# Patient Record
Sex: Female | Born: 1955
Health system: Southern US, Community
[De-identification: ages and names within clinical notes are randomized; demographics above are authoritative.]

## PROBLEM LIST (undated history)

## (undated) DIAGNOSIS — F32A Depression, unspecified: Secondary | ICD-10-CM

## (undated) DIAGNOSIS — Z800 Family history of malignant neoplasm of digestive organs: Secondary | ICD-10-CM

## (undated) DIAGNOSIS — C3490 Malignant neoplasm of unspecified part of unspecified bronchus or lung: Secondary | ICD-10-CM

## (undated) DIAGNOSIS — Z803 Family history of malignant neoplasm of breast: Secondary | ICD-10-CM

## (undated) DIAGNOSIS — Z8041 Family history of malignant neoplasm of ovary: Secondary | ICD-10-CM

## (undated) DIAGNOSIS — I10 Essential (primary) hypertension: Secondary | ICD-10-CM

## (undated) DIAGNOSIS — C801 Malignant (primary) neoplasm, unspecified: Secondary | ICD-10-CM

## (undated) DIAGNOSIS — F329 Major depressive disorder, single episode, unspecified: Secondary | ICD-10-CM

## (undated) DIAGNOSIS — E079 Disorder of thyroid, unspecified: Secondary | ICD-10-CM

## (undated) DIAGNOSIS — E785 Hyperlipidemia, unspecified: Secondary | ICD-10-CM

## (undated) HISTORY — PX: TUBAL LIGATION: SHX77

## (undated) HISTORY — DX: Disorder of thyroid, unspecified: E07.9

## (undated) HISTORY — DX: Hyperlipidemia, unspecified: E78.5

## (undated) HISTORY — PX: PARTIAL HYSTERECTOMY: SHX80

## (undated) HISTORY — DX: Family history of malignant neoplasm of breast: Z80.3

## (undated) HISTORY — DX: Family history of malignant neoplasm of digestive organs: Z80.0

## (undated) HISTORY — DX: Malignant neoplasm of unspecified part of unspecified bronchus or lung: C34.90

## (undated) HISTORY — PX: BILATERAL CARPAL TUNNEL RELEASE: SHX6508

## (undated) HISTORY — PX: LUNG BIOPSY: SHX232

## (undated) HISTORY — PX: BREAST CYST ASPIRATION: SHX578

## (undated) HISTORY — DX: Family history of malignant neoplasm of ovary: Z80.41

---

## 1987-04-22 DIAGNOSIS — C801 Malignant (primary) neoplasm, unspecified: Secondary | ICD-10-CM

## 1987-04-22 HISTORY — DX: Malignant (primary) neoplasm, unspecified: C80.1

## 2010-01-17 ENCOUNTER — Ambulatory Visit: Payer: Self-pay | Admitting: Family Medicine

## 2013-11-10 DIAGNOSIS — M79672 Pain in left foot: Secondary | ICD-10-CM | POA: Insufficient documentation

## 2013-11-10 DIAGNOSIS — F5104 Psychophysiologic insomnia: Secondary | ICD-10-CM | POA: Insufficient documentation

## 2013-11-10 DIAGNOSIS — E039 Hypothyroidism, unspecified: Secondary | ICD-10-CM | POA: Insufficient documentation

## 2013-11-10 DIAGNOSIS — E782 Mixed hyperlipidemia: Secondary | ICD-10-CM | POA: Insufficient documentation

## 2013-11-22 DIAGNOSIS — M79609 Pain in unspecified limb: Secondary | ICD-10-CM | POA: Insufficient documentation

## 2013-11-22 DIAGNOSIS — G479 Sleep disorder, unspecified: Secondary | ICD-10-CM | POA: Insufficient documentation

## 2013-11-22 DIAGNOSIS — R200 Anesthesia of skin: Secondary | ICD-10-CM | POA: Insufficient documentation

## 2014-04-25 DIAGNOSIS — E559 Vitamin D deficiency, unspecified: Secondary | ICD-10-CM | POA: Insufficient documentation

## 2014-05-15 DIAGNOSIS — N3941 Urge incontinence: Secondary | ICD-10-CM | POA: Insufficient documentation

## 2014-05-15 DIAGNOSIS — N952 Postmenopausal atrophic vaginitis: Secondary | ICD-10-CM | POA: Insufficient documentation

## 2014-10-30 DIAGNOSIS — G5603 Carpal tunnel syndrome, bilateral upper limbs: Secondary | ICD-10-CM | POA: Insufficient documentation

## 2014-12-19 ENCOUNTER — Other Ambulatory Visit: Payer: No Typology Code available for payment source

## 2014-12-19 ENCOUNTER — Telehealth: Payer: Self-pay

## 2014-12-19 DIAGNOSIS — R3129 Other microscopic hematuria: Secondary | ICD-10-CM

## 2014-12-19 LAB — URINALYSIS, COMPLETE
BILIRUBIN UA: NEGATIVE
Glucose, UA: NEGATIVE
LEUKOCYTES UA: NEGATIVE
Nitrite, UA: NEGATIVE
PH UA: 5.5 (ref 5.0–7.5)
PROTEIN UA: NEGATIVE
Specific Gravity, UA: >1.03 — ABNORMAL HIGH (ref 1.005–1.030)
Urobilinogen, Ur: 0.2 mg/dL (ref 0.2–1.0)

## 2014-12-19 LAB — MICROSCOPIC EXAMINATION

## 2014-12-19 NOTE — Telephone Encounter (Signed)
Pt called requesting another medication for OAB. Pt states insurance will not pay for myrbetriq. Pt also c/o increased itching in vaginal area. Pt states she was previously given a sample of estrace cream that helped tremendously and was wondering if she could have more. Pt was last seen 05/2014.

## 2014-12-19 NOTE — Telephone Encounter (Signed)
At the patient's last visit, she had microscopic hematuria and I asked her to RTC in 3 weeks for another urinalysis.  I don't see where that was done.  We can provide her with samples of the Myrbetriq if she wants to continue that medication.  If we have samples of the ESTRACE cream, she can have a sample of that as well.  She can leave a urine when she picks up the samples.

## 2014-12-19 NOTE — Telephone Encounter (Signed)
°  I called the pt and informed her that Larene Beach okay given her samples of Myrbetriq '25MG'$  & Estrace cream. I also informed her that we need a urine when she comes in.

## 2014-12-21 ENCOUNTER — Telehealth: Payer: Self-pay

## 2014-12-21 NOTE — Telephone Encounter (Signed)
LMOM in reference to no blood in urine.

## 2014-12-21 NOTE — Telephone Encounter (Signed)
-----   Message from Nori Riis, PA-C sent at 12/21/2014  8:32 AM EDT ----- Patient did not have blood in her urine.  She will need an appointment in February for her annual visit and we can check another UA at that visit.

## 2015-01-24 ENCOUNTER — Other Ambulatory Visit: Payer: Self-pay | Admitting: Internal Medicine

## 2015-01-24 DIAGNOSIS — Z1231 Encounter for screening mammogram for malignant neoplasm of breast: Secondary | ICD-10-CM

## 2015-01-25 ENCOUNTER — Ambulatory Visit
Admission: RE | Admit: 2015-01-25 | Discharge: 2015-01-25 | Disposition: A | Discharge status: Home / Self Care | Payer: No Typology Code available for payment source | Source: Ambulatory Visit | Attending: Internal Medicine | Admitting: Internal Medicine

## 2015-01-25 DIAGNOSIS — Z1231 Encounter for screening mammogram for malignant neoplasm of breast: Secondary | ICD-10-CM | POA: Insufficient documentation

## 2015-01-25 HISTORY — DX: Malignant (primary) neoplasm, unspecified: C80.1

## 2015-04-05 ENCOUNTER — Telehealth: Payer: Self-pay | Admitting: Urology

## 2015-04-05 NOTE — Telephone Encounter (Signed)
Pt called and is out of samples of Myrbetriq and wants to know if she can get some more.  Please call pt.

## 2015-04-05 NOTE — Telephone Encounter (Signed)
Spoke to patient and let her know that Larene Beach will put some samples up front for her and she needs a follow up appointment. Patient states her insurance will change after the first of the year and she will have to pay out of pocket could she be seen before the end of the year and I let her know to ask when she picks up samples if she has any openings that will fit her schedule before the end of the year. Patient states ok.

## 2015-07-31 DIAGNOSIS — IMO0002 Reserved for concepts with insufficient information to code with codable children: Secondary | ICD-10-CM | POA: Insufficient documentation

## 2015-07-31 DIAGNOSIS — Z79899 Other long term (current) drug therapy: Secondary | ICD-10-CM | POA: Insufficient documentation

## 2016-02-08 ENCOUNTER — Ambulatory Visit (INDEPENDENT_AMBULATORY_CARE_PROVIDER_SITE_OTHER): Payer: BLUE CROSS/BLUE SHIELD

## 2016-02-08 ENCOUNTER — Encounter (INDEPENDENT_AMBULATORY_CARE_PROVIDER_SITE_OTHER): Payer: Self-pay | Admitting: Vascular Surgery

## 2016-02-08 ENCOUNTER — Ambulatory Visit (INDEPENDENT_AMBULATORY_CARE_PROVIDER_SITE_OTHER): Payer: BLUE CROSS/BLUE SHIELD | Admitting: Vascular Surgery

## 2016-02-08 ENCOUNTER — Other Ambulatory Visit (INDEPENDENT_AMBULATORY_CARE_PROVIDER_SITE_OTHER): Payer: Self-pay | Admitting: Vascular Surgery

## 2016-02-08 VITALS — BP 132/85 | HR 95 | Resp 17 | Ht 69.0 in | Wt 194.0 lb

## 2016-02-08 DIAGNOSIS — I739 Peripheral vascular disease, unspecified: Secondary | ICD-10-CM

## 2016-02-08 DIAGNOSIS — I10 Essential (primary) hypertension: Secondary | ICD-10-CM | POA: Insufficient documentation

## 2016-02-08 DIAGNOSIS — M79605 Pain in left leg: Secondary | ICD-10-CM

## 2016-02-08 DIAGNOSIS — M79604 Pain in right leg: Secondary | ICD-10-CM

## 2016-02-08 DIAGNOSIS — M79609 Pain in unspecified limb: Secondary | ICD-10-CM | POA: Diagnosis not present

## 2016-02-08 DIAGNOSIS — I75021 Atheroembolism of right lower extremity: Secondary | ICD-10-CM

## 2016-02-08 DIAGNOSIS — I70213 Atherosclerosis of native arteries of extremities with intermittent claudication, bilateral legs: Secondary | ICD-10-CM

## 2016-02-08 DIAGNOSIS — F17200 Nicotine dependence, unspecified, uncomplicated: Secondary | ICD-10-CM | POA: Diagnosis not present

## 2016-02-08 NOTE — Assessment & Plan Note (Signed)
ABIs today are 0.92 on the right and 0.98 on the left with good waveforms except at the digital location on the right. She clearly had some atheroembolization to her right toes (blue toe syndrome). This has significantly improved from her initial visit. She has done well with medical management and does not require intervention at this point. Continue Plavix and Lipitor. Smoking cessation strongly recommended. Recheck her with duplex and ABIs in 3-4 months.

## 2016-02-08 NOTE — Assessment & Plan Note (Signed)
blood pressure control important in reducing the progression of atherosclerotic disease. On appropriate oral medications.  

## 2016-02-08 NOTE — Assessment & Plan Note (Signed)
We discussed the absolute need for smoking cessation due to the deleterious nature of tobacco on the vascular system. We discussed the tobacco use would diminish patency of any intervention, and likely significantly worsen progressio of disease. We discussed multiple agents for quitting including replacement therapy or medications to reduce cravings such as Chantix. The patient voices their understanding of the importance of smoking cessation. She was prescribed Chantix last time but not taking it currently. She has cut back on tobacco and is trying to quit.

## 2016-02-08 NOTE — Progress Notes (Signed)
MRN : [id number]  Kristina Wright is a 60 y.o. (10-Sep-1955) female who presents with chief complaint of  Chief Complaint  Patient presents with  . Follow-up  .  History of Present Illness: Patient returns today in follow up of Her blue toe syndrome on the right leg. Her toes look much better and her foot feels much better since her initial visit. She was started on Plavix and Lipitor and that seems to help significantly. She has not smoking yet, but she has cut back significantly. ABIs today are 0.92 on the right and 0.98 on the left with good waveforms except at the digital location on the right.     Current Outpatient Prescriptions  Medication Sig Dispense Refill  . atorvastatin (LIPITOR) 10 MG tablet   0  . Cholecalciferol (VITAMIN D) 2000 units tablet Take by mouth.    . clopidogrel (PLAVIX) 75 MG tablet   0  . FLUoxetine (PROZAC) 40 MG capsule Take by mouth.    Marland Kitchen ibuprofen (ADVIL,MOTRIN) 200 MG tablet Take by mouth.    . levothyroxine (SYNTHROID, LEVOTHROID) 200 MCG tablet TAKE ONE TABLET BY MOUTH ONCE DAILY...TAKE ON AN EMPTY STOMACH WITH A GLASS OF WATER AT LEAST 30-60 MINUTES BEFORE BREAKFAST    . lisinopril-hydrochlorothiazide (PRINZIDE,ZESTORETIC) 10-12.5 MG tablet Take by mouth.    . Multiple Vitamin (MULTI-VITAMINS) TABS Take by mouth.     No current facility-administered medications for this visit.     Past Medical History:  Diagnosis Date  . Cancer [facility]) 1989   cervical    Past Surgical History:  Procedure Laterality Date  . BREAST CYST ASPIRATION Left     Social History Social History  Substance Use Topics  . Smoking status: Current Every Day Smoker    Types: Cigarettes  . Smokeless tobacco: Current User  . Alcohol use No     Family History Family History  Problem Relation Age of Onset  . Colon cancer Mother 3  . Colon cancer Maternal Grandmother 78     No Known Allergies   REVIEW OF SYSTEMS (Negative unless checked)  Constitutional:  '[]'$ Weight loss  '[]'$ Fever  '[]'$ Chills Cardiac: '[]'$ Chest pain   '[]'$ Chest pressure   '[]'$ Palpitations   '[]'$ Shortness of breath when laying flat   '[]'$ Shortness of breath at rest   '[]'$ Shortness of breath with exertion. Vascular:  '[]'$ Pain in legs with walking   '[]'$ Pain in legs at rest   '[]'$ Pain in legs when laying flat   '[]'$ Claudication   '[]'$ Pain in feet when walking  '[x]'$ Pain in feet at rest  '[]'$ Pain in feet when laying flat   '[]'$ History of DVT   '[]'$ Phlebitis   '[]'$ Swelling in legs   '[]'$ Varicose veins   '[]'$ Non-healing ulcers Pulmonary:   '[]'$ Uses home oxygen   '[]'$ Productive cough   '[]'$ Hemoptysis   '[]'$ Wheeze  '[]'$ COPD   '[]'$ Asthma Neurologic:  '[]'$ Dizziness  '[]'$ Blackouts   '[]'$ Seizures   '[]'$ History of stroke   '[]'$ History of TIA  '[]'$ Aphasia   '[]'$ Temporary blindness   '[]'$ Dysphagia   '[]'$ Weakness or numbness in arms   '[]'$ Weakness or numbness in legs Musculoskeletal:  '[]'$ Arthritis   '[]'$ Joint swelling   '[]'$ Joint pain   '[]'$ Low back pain Hematologic:  '[]'$ Easy bruising  '[]'$ Easy bleeding   '[]'$ Hypercoagulable state   '[]'$ Anemic   Gastrointestinal:  '[]'$ Blood in stool   '[]'$ Vomiting blood  '[]'$ Gastroesophageal reflux/heartburn   '[]'$ Abdominal pain Genitourinary:  '[]'$ Chronic kidney disease   '[]'$ Difficult urination  '[]'$ Frequent urination  '[]'$ Burning with urination   '[]'$ Hematuria Skin:  '[]'$ Rashes   '[]'$   Ulcers   '[]'$ Wounds Psychological:  '[]'$ History of anxiety   '[x]'$  History of major depression.  Physical Examination  BP 132/85   Pulse 95   Resp 17   Ht '5\' 9"'$  (1.753 m)   Wt 194 lb (88 kg)   BMI 28.65 kg/m  Gen:  WD/WN, NAD Head: [place]/AT, No temporalis wasting. Ear/Nose/Throat: Hearing grossly intact, nares w/o erythema or drainage, trachea midline Eyes: PERRLA, Sclera non-icteric Neck: Supple, no nuchal rigidity.  No JVD.  Pulmonary:  Good air movement, no use of accessory muscles.  Cardiac: RRR, normal S1, S2 Vascular: Mild cyanosis of the right third and fifth toes but much better than her initial visit. Vessel Right Left  Radial Palpable Palpable  Ulnar Palpable Palpable    Brachial Palpable Palpable  Carotid Palpable, without bruit Palpable, without bruit  Aorta Not palpable N/A  Femoral Palpable Palpable  Popliteal Palpable Palpable  PT 1+ Palpable 1+ Palpable  DP 1+ Palpable Palpable   Gastrointestinal: soft, non-tender/non-distended. No guarding/reflex.  Musculoskeletal: M/S 5/5 throughout.  No deformity or atrophy. No lower extremity edema. Neurologic: CN 2-12 intact. Pain and light touch intact in extremities.  Symmetrical.  Speech is fluent.  Psychiatric: Judgment intact, Mood & affect appropriate for pt's clinical situation. Dermatologic: No rashes or ulcers noted.  No cellulitis or open wounds. Lymph : No Cervical, Axillary, or Inguinal lymphadenopathy.      Labs No results found for this or any previous visit (from the past 2160 hour(s)).  Radiology No results found.   Assessment/Plan  Tobacco use disorder We discussed the absolute need for smoking cessation due to the deleterious nature of tobacco on the vascular system. We discussed the tobacco use would diminish patency of any intervention, and likely significantly worsen progressio of disease. We discussed multiple agents for quitting including replacement therapy or medications to reduce cravings such as Chantix. The patient voices their understanding of the importance of smoking cessation. She was prescribed Chantix last time but not taking it currently. She has cut back on tobacco and is trying to quit.   Essential hypertension blood pressure control important in reducing the progression of atherosclerotic disease. On appropriate oral medications.   PVD (peripheral vascular disease) (HCC) ABIs today are 0.92 on the right and 0.98 on the left with good waveforms except at the digital location on the right. She clearly had some atheroembolization to her right toes (blue toe syndrome). This has significantly improved from her initial visit. She has done well with medical management  and does not require intervention at this point. Continue Plavix and Lipitor. Smoking cessation strongly recommended. Recheck her with duplex and ABIs in 3-4 months.  Blue toe syndrome of right lower extremity (HCC) ABIs today are 0.92 on the right and 0.98 on the left with good waveforms except at the digital location on the right. She clearly had some atheroembolization to her right toes (blue toe syndrome). This has significantly improved from her initial visit. She has done well with medical management and does not require intervention at this point. Continue Plavix and Lipitor. Smoking cessation strongly recommended. Recheck her with duplex and ABIs in 3-4 months.    Leotis Pain, MD  02/08/2016 5:02 PM    This note was created with Dragon medical transcription system.  Any errors from dictation are purely unintentional

## 2016-02-15 ENCOUNTER — Other Ambulatory Visit: Payer: Self-pay | Admitting: Internal Medicine

## 2016-02-15 DIAGNOSIS — Z1231 Encounter for screening mammogram for malignant neoplasm of breast: Secondary | ICD-10-CM

## 2016-02-21 ENCOUNTER — Ambulatory Visit: Admission: RE | Admit: 2016-02-21 | Payer: No Typology Code available for payment source | Source: Ambulatory Visit

## 2016-06-03 ENCOUNTER — Ambulatory Visit (INDEPENDENT_AMBULATORY_CARE_PROVIDER_SITE_OTHER): Payer: BLUE CROSS/BLUE SHIELD | Admitting: Vascular Surgery

## 2016-06-03 ENCOUNTER — Encounter (INDEPENDENT_AMBULATORY_CARE_PROVIDER_SITE_OTHER): Payer: BLUE CROSS/BLUE SHIELD

## 2016-12-19 ENCOUNTER — Ambulatory Visit
Admission: EM | Admit: 2016-12-19 | Discharge: 2016-12-19 | Disposition: A | Discharge status: Home / Self Care | Payer: Self-pay | Attending: Family Medicine | Admitting: Family Medicine

## 2016-12-19 DIAGNOSIS — L02215 Cutaneous abscess of perineum: Secondary | ICD-10-CM

## 2016-12-19 DIAGNOSIS — I10 Essential (primary) hypertension: Secondary | ICD-10-CM

## 2016-12-19 HISTORY — DX: Essential (primary) hypertension: I10

## 2016-12-19 HISTORY — DX: Depression, unspecified: F32.A

## 2016-12-19 HISTORY — DX: Major depressive disorder, single episode, unspecified: F32.9

## 2016-12-19 MED ORDER — MUPIROCIN 2 % EX OINT: TOPICAL_OINTMENT | CUTANEOUS | 0 refills | Status: DC

## 2016-12-19 MED ORDER — OXYCODONE-ACETAMINOPHEN 5-325 MG PO TABS: 1.0000 | ORAL_TABLET | Freq: Three times a day (TID) | ORAL | 0 refills | Status: DC | PRN

## 2016-12-19 MED ORDER — SULFAMETHOXAZOLE-TRIMETHOPRIM 800-160 MG PO TABS: 1.0000 | ORAL_TABLET | Freq: Two times a day (BID) | ORAL | 0 refills | Status: AC

## 2016-12-19 NOTE — Discharge Instructions (Signed)
Take medication as prescribed. Rest. Drink plenty of fluids. Keep clean. Use warm compresses and sitz baths as discussed.   Monitor blood pressure closely as discussed and take medication as prescribed. Please follow up with your primary doctor regarding this.   Follow up with surgery, as discussed, see above to call, for continued complaints.   Return to Urgent care for new or worsening concerns.

## 2016-12-19 NOTE — ED Provider Notes (Addendum)
[facility] ____________________________________________  Time seen: Approximately 10:40 AM  I have reviewed the triage vital signs and the nursing notes.   HISTORY  Chief Complaint Abscess  HPI Kristina Wright is a 61 y.o. female  Presents for evaluation of right buttocks tender skin area. States she noticed the area about one month ago, the area much improved and then got worse again over the last week. States the area is tender. States pain is only with direct touching or when sitting on the area. Reports continues to urinate and move bowels normally. Denies any vaginal or rectal discomfort, rectal leakage, bloody stool, abdominal pain, back pain, fevers, nausea, vomiting, diarrhea, constipation, or other complaints. Denies any history of similar. States no pain at this time, but up to moderate pain when present. States no drainage or surrounding redness. States she has been applying topical boil ease without change. No other over-the-counter medications taken. Reports otherwise feels well. Denies other skin changes. Denies insect bite or tick attachment.   Denies chest pain, shortness of breath, abdominal pain, dysuria, dizziness, vision changes, headache, swelling. Denies recent sickness. Denies recent antibiotic use. Denies history of MRSA or previous skin infections. States tetanus immunization is up to date.   Patient reports that she is a history of hypertension and depression, but states she recently stopped all of her medications. Patient states currently no medications taken daily. Patient reports that she has also recently quit smoking.   Ezequiel Kayser, MD: PCP    Past Medical History:  Diagnosis Date  . Cancer ([place]) 1989   cervical  . Depression   . Hypertension     Patient Active Problem List   Diagnosis Date Noted  . Tobacco use disorder 02/08/2016  . Essential hypertension 02/08/2016  . PVD (peripheral vascular disease) ([place]) 02/08/2016  . Blue toe  syndrome of right lower extremity ([place]) 02/08/2016    Past Surgical History:  Procedure Laterality Date  . BREAST CYST ASPIRATION Left      No current facility-administered medications for this encounter.   Current Outpatient Prescriptions:  .  atorvastatin (LIPITOR) 10 MG tablet, , Disp: , Rfl: 0 .  Cholecalciferol (VITAMIN D) 2000 units tablet, Take by mouth., Disp: , Rfl:  .  clopidogrel (PLAVIX) 75 MG tablet, , Disp: , Rfl: 0 .  FLUoxetine (PROZAC) 40 MG capsule, Take by mouth., Disp: , Rfl:  .  ibuprofen (ADVIL,MOTRIN) 200 MG tablet, Take by mouth., Disp: , Rfl:  .  levothyroxine (SYNTHROID, LEVOTHROID) 200 MCG tablet, TAKE ONE TABLET BY MOUTH ONCE DAILY...TAKE ON AN EMPTY STOMACH WITH A GLASS OF WATER AT LEAST 30-60 MINUTES BEFORE BREAKFAST, Disp: , Rfl:  .  lisinopril-hydrochlorothiazide (PRINZIDE,ZESTORETIC) 10-12.5 MG tablet, Take by mouth., Disp: , Rfl:  .  Multiple Vitamin (MULTI-VITAMINS) TABS, Take by mouth., Disp: , Rfl:  .  mupirocin ointment (BACTROBAN) 2 %, Apply two times a day for 7 days., Disp: 22 g, Rfl: 0 .  oxyCODONE-acetaminophen (ROXICET) 5-325 MG tablet, Take 1 tablet by mouth every 8 (eight) hours as needed for moderate pain or severe pain (Do not drive or operate heavy machinery while taking as can cause drowsiness.)., Disp: 9 tablet, Rfl: 0 .  sulfamethoxazole-trimethoprim (BACTRIM DS,SEPTRA DS) 800-160 MG tablet, Take 1 tablet by mouth 2 (two) times daily., Disp: 20 tablet, Rfl: 0  Allergies Patient has no known allergies.  Family History  Problem Relation Age of Onset  . Colon cancer Mother 2  . Colon cancer Maternal Grandmother 37  Social History Social History  Substance Use Topics  . Smoking status: Former Smoker    Types: Cigarettes    Quit date: 12/05/2016  . Smokeless tobacco: Current User  . Alcohol use No    Review of Systems Constitutional: No fever/chills Eyes: No visual changes. Cardiovascular: Denies chest pain. Respiratory:  Denies shortness of breath. Gastrointestinal: No abdominal pain.  No nausea, no vomiting.  No diarrhea.  No constipation. Genitourinary: Negative for dysuria. Musculoskeletal: Negative for back pain. Skin: As above.  Neurological: Negative for headaches, focal weakness or numbness.  ____________________________________________   PHYSICAL EXAM:  VITAL SIGNS: ED Triage Vitals  Enc Vitals Group     BP 12/19/16 0947 (!) 180/87     Pulse Rate 12/19/16 0947 65     Resp 12/19/16 0947 18     Temp 12/19/16 0947 98.2 F (36.8 C)     Temp Source 12/19/16 0947 Oral     SpO2 12/19/16 0947 100 %     Weight 12/19/16 0948 197 lb (89.4 kg)     Height 12/19/16 0948 5\' 9"  (1.753 m)     Head Circumference --      Peak Flow --      Pain Score 12/19/16 0948 8     Pain Loc --      Pain Edu? --      Excl. in [place]? --     Constitutional: Alert and oriented. Well appearing and in no acute distress. Cardiovascular: Normal rate, regular rhythm. Grossly normal heart sounds.  Good peripheral circulation. Respiratory: Normal respiratory effort without tachypnea nor retractions. Breath sounds are clear and equal bilaterally. No wheezes, rales, rhonchi. Gastrointestinal: Soft and nontender. No distention.  Musculoskeletal:  Steady gait. Neurologic:  Normal speech and language. No gross focal neurologic deficits are appreciated. Speech is normal. No gait instability.  Skin:  Skin is warm, dry.  Except: (exam completed with Gwinda Passe RN at bedside as chaperone). Area of approximately 3 x 2 cm tender minimally erythematous indurated area right sided gluteus at perineum without palpable tracking to anus or vagina, no fluctuance, no pointing abscess, superficial appearing excoriation at indurated site, no vaginal or rectal tenderness, no drainage or leaking at abscess site or vaginal or anal openings. No other surrounding skin changes noted.  Psychiatric: Mood and affect are normal. Speech and behavior are normal.  Patient exhibits appropriate insight and judgment   ___________________________________________   LABS (all labs ordered are listed, but only abnormal results are displayed)  Labs Reviewed - No data to display  RADIOLOGY  No results found. ____________________________________________   PROCEDURES Procedures    INITIAL IMPRESSION / ASSESSMENT AND PLAN / ED COURSE    Pertinent labs & imaging results that were available during my care of the patient were reviewed by me and considered in my medical decision making (see chart for details).  Well-appearing patient. No acute distress. Patient has right perineal abscess, no fluctuance, no I&D indicated at this time. No history of similar in the past. Localized area appearance without rectal or vaginal involvement. Will begin patient on oral Bactrim, topical Bactroban and when necessary Percocet as needed for breakthrough pain. Discussed warm compresses, sitz baths and close monitoring. Information given for outpatient surgery to follow-up in the next 3-4 days if no improvement.Discussed indication, risks and benefits of medications with patient.   Also patient with noted elevated blood pressure reading in urgent care, denies complaints outside of presenting complaint, states she has a chronic history of hypertension. Discussed with  patient management of hypertension including exercise, low-sodium, or other supportive managements, discussed evaluation of BMP at this time initiation of blood pressure medicine. Patient states that she does not want this managed from the urgent care. States not currently taking a blood pressure medication but states that she does have at home. States that she has her HCTZ at home. Patient states that she will resume this medicine and call to schedule follow-up with her primary care in the next week.  Discussed follow up with Primary care physician this week. Discussed follow up and return parameters including no  resolution or any worsening concerns. Patient verbalized understanding and agreed to plan.    [place] controlled substance database reviewed, and no recent controlled medications documented.   ____________________________________________   FINAL CLINICAL IMPRESSION(S) / ED DIAGNOSES  Final diagnoses:  Perineal abscess  Hypertension, unspecified type     Discharge Medication List as of 12/19/2016 10:47 AM    START taking these medications   Details  mupirocin ointment (BACTROBAN) 2 % Apply two times a day for 7 days., Normal    oxyCODONE-acetaminophen (ROXICET) 5-325 MG tablet Take 1 tablet by mouth every 8 (eight) hours as needed for moderate pain or severe pain (Do not drive or operate heavy machinery while taking as can cause drowsiness.)., Starting Fri 12/19/2016, Print    sulfamethoxazole-trimethoprim (BACTRIM DS,SEPTRA DS) 800-160 MG tablet Take 1 tablet by mouth 2 (two) times daily., Starting Fri 12/19/2016, Until Fri 12/26/2016, Normal        Note: This dictation was prepared with Dragon dictation along with smaller phrase technology. Any transcriptional errors that result from this process are unintentional.         Marylene Land, NP 12/19/16 1209    Marylene Land, NP 12/19/16 1616

## 2016-12-19 NOTE — ED Triage Notes (Signed)
Pt reports abscess to right of peri-area x "awhile". Now pain getting worse. Has tried Boil ease without relief. No drainage. Pain 8/10

## 2017-06-08 ENCOUNTER — Encounter: Payer: Self-pay | Admitting: Family Medicine

## 2017-06-08 ENCOUNTER — Ambulatory Visit: Payer: BLUE CROSS/BLUE SHIELD | Admitting: Family Medicine

## 2017-06-08 VITALS — BP 170/120 | HR 88 | Ht 69.0 in | Wt 189.0 lb

## 2017-06-08 DIAGNOSIS — R490 Dysphonia: Secondary | ICD-10-CM | POA: Diagnosis not present

## 2017-06-08 DIAGNOSIS — E782 Mixed hyperlipidemia: Secondary | ICD-10-CM

## 2017-06-08 DIAGNOSIS — E039 Hypothyroidism, unspecified: Secondary | ICD-10-CM | POA: Diagnosis not present

## 2017-06-08 DIAGNOSIS — I739 Peripheral vascular disease, unspecified: Secondary | ICD-10-CM

## 2017-06-08 DIAGNOSIS — F17200 Nicotine dependence, unspecified, uncomplicated: Secondary | ICD-10-CM | POA: Diagnosis not present

## 2017-06-08 DIAGNOSIS — I10 Essential (primary) hypertension: Secondary | ICD-10-CM

## 2017-06-08 MED ORDER — ATORVASTATIN CALCIUM 10 MG PO TABS: 10.0000 mg | ORAL_TABLET | Freq: Every morning | ORAL | 1 refills | Status: DC

## 2017-06-08 MED ORDER — LEVOTHYROXINE SODIUM 50 MCG PO TABS: 50.0000 ug | ORAL_TABLET | Freq: Every day | ORAL | 1 refills | Status: DC

## 2017-06-08 MED ORDER — LISINOPRIL-HYDROCHLOROTHIAZIDE 10-12.5 MG PO TABS: 1.0000 | ORAL_TABLET | Freq: Every day | ORAL | 1 refills | Status: DC

## 2017-06-08 NOTE — Progress Notes (Signed)
Name: Kristina Wright   MRN: [id number]    DOB: 11-15-1955   Date:06/08/2017       Progress Note  Subjective  Chief Complaint  Chief Complaint  Patient presents with  . Establish Care  . Hoarse    lost brothers to throat cancer.     Patient presents to establish care with primary care. Hoareness for 2 months.     Hypertension  This is a chronic problem. The current episode started more than 1 year ago. The problem has been gradually worsening since onset. The problem is uncontrolled. Associated symptoms include malaise/fatigue, neck pain and shortness of breath. Pertinent negatives include no anxiety, blurred vision, chest pain, headaches, orthopnea, palpitations, peripheral edema, PND or sweats. There are no associated agents to hypertension. Risk factors for coronary artery disease include dyslipidemia and smoking/tobacco exposure. Past treatments include nothing. The current treatment provides mild improvement. Compliance problems include medication cost and medication side effects.  Hypertensive end-organ damage includes PVD. There is no history of angina, kidney disease, CAD/MI, CVA, left ventricular hypertrophy or retinopathy. embolic event. Identifiable causes of hypertension include a thyroid problem. There is no history of chronic renal disease, a hypertension causing med or renovascular disease.  Thyroid Problem  Presents for follow-up (off medication) visit. Symptoms include hoarse voice and weight loss. Patient reports no anxiety, cold intolerance, constipation, depressed mood, diaphoresis, diarrhea, dry skin, fatigue, hair loss, heat intolerance, leg swelling, nail problem, palpitations, tremors, visual change or weight gain. The symptoms have been stable.  Nicotine Dependence  Presents for follow-up visit. Symptoms are negative for fatigue, insomnia and sore throat. Her urge triggers include company of smokers. The symptoms have been worsening. Time of day of first tobacco use:  resumed smoking. She smokes < 1/2 a pack (pack/week) of cigarettes per day. Compliance with prior treatments has been poor.  Other  This is a chronic (for pvd) problem. The current episode started more than 1 year ago. The problem has been gradually worsening. Associated symptoms include congestion, coughing and neck pain. Pertinent negatives include no abdominal pain, chest pain, chills, diaphoresis, fatigue, fever, headaches, myalgias, nausea, rash, sore throat, visual change or vomiting. Associated symptoms comments: claudication. The symptoms are aggravated by walking and exertion. She has tried nothing for the symptoms.  Cough  This is a recurrent problem. The current episode started more than 1 month ago. The problem has been waxing and waning. The cough is non-productive. Associated symptoms include shortness of breath and weight loss. Pertinent negatives include no chest pain, chills, ear pain, fever, headaches, heartburn, hemoptysis, myalgias, rash, sore throat, sweats or wheezing. There is no history of environmental allergies.    No problem-specific Assessment & Plan notes found for this encounter.   Past Medical History:  Diagnosis Date  . Cancer ([place]) 1989   cervical  . Depression   . Hyperlipidemia   . Hypertension   . Thyroid disease     Past Surgical History:  Procedure Laterality Date  . BREAST CYST ASPIRATION Left     Family History  Problem Relation Age of Onset  . Colon cancer Mother 80  . Colon cancer Maternal Grandmother 78    Social History   Socioeconomic History  . Marital status: Married    Spouse name: Not on file  . Number of children: Not on file  . Years of education: Not on file  . Highest education level: Not on file  Social Needs  . Financial resource strain: Not on file  .  Food insecurity - worry: Not on file  . Food insecurity - inability: Not on file  . Transportation needs - medical: Not on file  . Transportation needs - non-medical:  Not on file  Occupational History  . Not on file  Tobacco Use  . Smoking status: Current Every Day Smoker    Types: Cigarettes    Last attempt to quit: 12/05/2016    Years since quitting: 0.5  . Smokeless tobacco: Current User  Substance and Sexual Activity  . Alcohol use: No  . Drug use: No  . Sexual activity: Not on file  Other Topics Concern  . Not on file  Social History Narrative  . Not on file    No Known Allergies  Outpatient Medications Prior to Visit  Medication Sig Dispense Refill  . Multiple Vitamin (MULTI-VITAMINS) TABS Take by mouth.    . Cholecalciferol (VITAMIN D) 2000 units tablet Take by mouth.    . clopidogrel (PLAVIX) 75 MG tablet   0  . FLUoxetine (PROZAC) 40 MG capsule Take by mouth.    Marland Kitchen ibuprofen (ADVIL,MOTRIN) 200 MG tablet Take by mouth.    Marland Kitchen atorvastatin (LIPITOR) 10 MG tablet   0  . levothyroxine (SYNTHROID, LEVOTHROID) 200 MCG tablet TAKE ONE TABLET BY MOUTH ONCE DAILY...TAKE ON AN EMPTY STOMACH WITH A GLASS OF WATER AT LEAST 30-60 MINUTES BEFORE BREAKFAST    . lisinopril-hydrochlorothiazide (PRINZIDE,ZESTORETIC) 10-12.5 MG tablet Take by mouth.    . mupirocin ointment (BACTROBAN) 2 % Apply two times a day for 7 days. 22 g 0  . oxyCODONE-acetaminophen (ROXICET) 5-325 MG tablet Take 1 tablet by mouth every 8 (eight) hours as needed for moderate pain or severe pain (Do not drive or operate heavy machinery while taking as can cause drowsiness.). 9 tablet 0   No facility-administered medications prior to visit.     Review of Systems  Constitutional: Positive for malaise/fatigue and weight loss. Negative for chills, diaphoresis, fatigue, fever and weight gain.  HENT: Positive for congestion and hoarse voice. Negative for ear discharge, ear pain, hearing loss, nosebleeds, sinus pain, sore throat and tinnitus.        Hoarse  Eyes: Negative for blurred vision.  Respiratory: Positive for cough and shortness of breath. Negative for hemoptysis, sputum  production, wheezing and stridor.   Cardiovascular: Positive for claudication. Negative for chest pain, palpitations, orthopnea, leg swelling and PND.  Gastrointestinal: Negative for abdominal pain, blood in stool, constipation, diarrhea, heartburn, melena, nausea and vomiting.  Genitourinary: Negative for dysuria, flank pain, frequency, hematuria and urgency.  Musculoskeletal: Positive for neck pain. Negative for back pain, falls, joint pain and myalgias.  Skin: Negative for rash.  Neurological: Negative for dizziness, tingling, tremors, sensory change, focal weakness and headaches.  Endo/Heme/Allergies: Negative for environmental allergies, cold intolerance, heat intolerance and polydipsia. Does not bruise/bleed easily.  Psychiatric/Behavioral: Negative for depression and suicidal ideas. The patient is not nervous/anxious and does not have insomnia.      Objective  Vitals:   06/08/17 1444  BP: (!) 170/120  Pulse: 88  Weight: 189 lb (85.7 kg)  Height: 5\' 9"  (1.753 m)    Physical Exam  Constitutional: She is well-developed, well-nourished, and in no distress. No distress.  HENT:  Head: Normocephalic and atraumatic.  Right Ear: External ear normal.  Left Ear: External ear normal.  Nose: Nose normal.  Mouth/Throat: Oropharynx is clear and moist.  Eyes: Conjunctivae and EOM are normal. Pupils are equal, round, and reactive to light. Right eye exhibits  no discharge. Left eye exhibits no discharge.  Neck: Normal range of motion. Neck supple. No JVD present. No thyromegaly present.  Cardiovascular: Normal rate, regular rhythm, normal heart sounds and intact distal pulses. Exam reveals no gallop and no friction rub.  No murmur heard. Pulmonary/Chest: Effort normal and breath sounds normal. She has no wheezes. She has no rales.  Abdominal: Soft. Bowel sounds are normal. She exhibits no mass. There is no tenderness. There is no guarding.  Musculoskeletal: Normal range of motion. She  exhibits no edema.  Lymphadenopathy:    She has no cervical adenopathy.  Neurological: She is alert. She has normal reflexes.  Skin: Skin is warm and dry. She is not diaphoretic.  Psychiatric: Mood and affect normal.  Nursing note and vitals reviewed.     Assessment & Plan  Problem List Items Addressed This Visit      Cardiovascular and Mediastinum   Essential hypertension - Primary   Relevant Medications   lisinopril-hydrochlorothiazide (PRINZIDE,ZESTORETIC) 10-12.5 MG tablet   atorvastatin (LIPITOR) 10 MG tablet   PVD (peripheral vascular disease) (HCC)   Relevant Medications   lisinopril-hydrochlorothiazide (PRINZIDE,ZESTORETIC) 10-12.5 MG tablet   atorvastatin (LIPITOR) 10 MG tablet     Other   Tobacco use disorder    Other Visit Diagnoses    Mixed hyperlipidemia       Relevant Medications   lisinopril-hydrochlorothiazide (PRINZIDE,ZESTORETIC) 10-12.5 MG tablet   atorvastatin (LIPITOR) 10 MG tablet   Hypothyroidism, unspecified type       Relevant Medications   levothyroxine (SYNTHROID, LEVOTHROID) 50 MCG tablet   Hoarseness of voice       pt to decide on referral      Meds ordered this encounter  Medications  . lisinopril-hydrochlorothiazide (PRINZIDE,ZESTORETIC) 10-12.5 MG tablet    Sig: Take 1 tablet by mouth daily.    Dispense:  30 tablet    Refill:  1  . atorvastatin (LIPITOR) 10 MG tablet    Sig: Take 1 tablet (10 mg total) by mouth every morning.    Dispense:  30 tablet    Refill:  1  . levothyroxine (SYNTHROID, LEVOTHROID) 50 MCG tablet    Sig: Take 1 tablet (50 mcg total) by mouth daily.    Dispense:  30 tablet    Refill:  1  Patient has been advised of the health risks of smoking and counseled concerning cessation of tobacco products. I spent over 3 minutes for discussion and to answer questions.    Dr. [facility]  06/08/17

## 2017-06-08 NOTE — Patient Instructions (Signed)
Steps to Quit Smoking Smoking tobacco can be bad for your health. It can also affect almost every organ in your body. Smoking puts you and people around you at risk for many serious long-lasting (chronic) diseases. Quitting smoking is hard, but it is one of the best things that you can do for your health. It is never too late to quit. What are the benefits of quitting smoking? When you quit smoking, you lower your risk for getting serious diseases and conditions. They can include:  Lung cancer or lung disease.  Heart disease.  Stroke.  Heart attack.  Not being able to have children (infertility).  Weak bones (osteoporosis) and broken bones (fractures).  If you have coughing, wheezing, and shortness of breath, those symptoms may get better when you quit. You may also get sick less often. If you are pregnant, quitting smoking can help to lower your chances of having a baby of low birth weight. What can I do to help me quit smoking? Talk with your doctor about what can help you quit smoking. Some things you can do (strategies) include:  Quitting smoking totally, instead of slowly cutting back how much you smoke over a period of time.  Going to in-person counseling. You are more likely to quit if you go to many counseling sessions.  Using resources and support systems, such as: ? Online chats with a counselor. ? Phone quitlines. ? Printed self-help materials. ? Support groups or group counseling. ? Text messaging programs. ? Mobile phone apps or applications.  Taking medicines. Some of these medicines may have nicotine in them. If you are pregnant or breastfeeding, do not take any medicines to quit smoking unless your doctor says it is okay. Talk with your doctor about counseling or other things that can help you.  Talk with your doctor about using more than one strategy at the same time, such as taking medicines while you are also going to in-person counseling. This can help make  quitting easier. What things can I do to make it easier to quit? Quitting smoking might feel very hard at first, but there is a lot that you can do to make it easier. Take these steps:  Talk to your family and friends. Ask them to support and encourage you.  Call phone quitlines, reach out to support groups, or work with a counselor.  Ask people who smoke to not smoke around you.  Avoid places that make you want (trigger) to smoke, such as: ? Bars. ? Parties. ? Smoke-break areas at work.  Spend time with people who do not smoke.  Lower the stress in your life. Stress can make you want to smoke. Try these things to help your stress: ? Getting regular exercise. ? Deep-breathing exercises. ? Yoga. ? Meditating. ? Doing a body scan. To do this, close your eyes, focus on one area of your body at a time from head to toe, and notice which parts of your body are tense. Try to relax the muscles in those areas.  Download or buy apps on your mobile phone or tablet that can help you stick to your quit plan. There are many free apps, such as QuitGuide from the CDC (Centers for Disease Control and Prevention). You can find more support from smokefree.gov and other websites.  This information is not intended to replace advice given to you by your health care provider. Make sure you discuss any questions you have with your health care provider. Document Released: 02/01/2009 Document   Revised: 12/04/2015 Document Reviewed: 08/22/2014 Elsevier Interactive Patient Education  2018 [employer]. Bupropion sustained-release tablets (smoking cessation) What is this medicine? BUPROPION (byoo PROE pee on) is used to help people quit smoking. This medicine may be used for other purposes; ask your health care provider or pharmacist if you have questions. COMMON BRAND NAME(S): Buproban, Zyban What should I tell my health care provider before I take this medicine? They need to know if you have any of these  conditions: -an eating disorder, such as anorexia or bulimia -bipolar disorder or psychosis -diabetes or high blood sugar, treated with medication -glaucoma -head injury or brain tumor -heart disease, previous heart attack, or irregular heart beat -high blood pressure -kidney or liver disease -seizures -suicidal thoughts or a previous suicide attempt -Tourette's syndrome -weight loss -an unusual or allergic reaction to bupropion, other medicines, foods, dyes, or preservatives -breast-feeding -pregnant or trying to become pregnant How should I use this medicine? Take this medicine by mouth with a glass of water. Follow the directions on the prescription label. You can take it with or without food. If it upsets your stomach, take it with food. Do not cut, crush or chew this medicine. Take your medicine at regular intervals. If you take this medicine more than once a day, take your second dose at least 8 hours after you take your first dose. To limit difficulty in sleeping, avoid taking this medicine at bedtime. Do not take your medicine more often than directed. Do not stop taking this medicine suddenly except upon the advice of your doctor. Stopping this medicine too quickly may cause serious side effects. A special MedGuide will be given to you by the pharmacist with each prescription and refill. Be sure to read this information carefully each time. Talk to your pediatrician regarding the use of this medicine in children. Special care may be needed. Overdosage: If you think you have taken too much of this medicine contact a poison control center or emergency room at once. NOTE: This medicine is only for you. Do not share this medicine with others. What if I miss a dose? If you miss a dose, skip the missed dose and take your next tablet at the regular time. There should be at least 8 hours between doses. Do not take double or extra doses. What may interact with this medicine? Do not take this  medicine with any of the following medications: -linezolid -MAOIs like Azilect, Carbex, Eldepryl, Marplan, Nardil, and Parnate -methylene blue (injected into a vein) -other medicines that contain bupropion like Wellbutrin This medicine may also interact with the following medications: -alcohol -certain medicines for anxiety or sleep -certain medicines for blood pressure like metoprolol, propranolol -certain medicines for depression or psychotic disturbances -certain medicines for HIV or AIDS like efavirenz, lopinavir, nelfinavir, ritonavir -certain medicines for irregular heart beat like propafenone, flecainide -certain medicines for Parkinson's disease like amantadine, levodopa -certain medicines for seizures like carbamazepine, phenytoin, phenobarbital -cimetidine -clopidogrel -cyclophosphamide -digoxin -furazolidone -isoniazid -nicotine -orphenadrine -procarbazine -steroid medicines like prednisone or cortisone -stimulant medicines for attention disorders, weight loss, or to stay awake -tamoxifen -theophylline -thiotepa -ticlopidine -tramadol -warfarin This list may not describe all possible interactions. Give your health care provider a list of all the medicines, herbs, non-prescription drugs, or dietary supplements you use. Also tell them if you smoke, drink alcohol, or use illegal drugs. Some items may interact with your medicine. What should I watch for while using this medicine? Visit your doctor or health care professional for  regular checks on your progress. This medicine should be used together with a patient support program. It is important to participate in a behavioral program, counseling, or other support program that is recommended by your health care professional. Patients and their families should watch out for new or worsening thoughts of suicide or depression. Also watch out for sudden changes in feelings such as feeling anxious, agitated, panicky, irritable,  hostile, aggressive, impulsive, severely restless, overly excited and hyperactive, or not being able to sleep. If this happens, especially at the beginning of treatment or after a change in dose, call your health care professional. Avoid alcoholic drinks while taking this medicine. Drinking excessive alcoholic beverages, using sleeping or anxiety medicines, or quickly stopping the use of these agents while taking this medicine may increase your risk for a seizure. Do not drive or use heavy machinery until you know how this medicine affects you. This medicine can impair your ability to perform these tasks. Do not take this medicine close to bedtime. It may prevent you from sleeping. Your mouth may get dry. Chewing sugarless gum or sucking hard candy, and drinking plenty of water may help. Contact your doctor if the problem does not go away or is severe. Do not use nicotine patches or chewing gum without the advice of your doctor or health care professional while taking this medicine. You may need to have your blood pressure taken regularly if your doctor recommends that you use both nicotine and this medicine together. What side effects may I notice from receiving this medicine? Side effects that you should report to your doctor or health care professional as soon as possible: -allergic reactions like skin rash, itching or hives, swelling of the face, lips, or tongue -breathing problems -changes in vision -confusion -elevated mood, decreased need for sleep, racing thoughts, impulsive behavior -fast or irregular heartbeat -hallucinations, loss of contact with reality -increased blood pressure -redness, blistering, peeling or loosening of the skin, including inside the mouth -seizures -suicidal thoughts or other mood changes -unusually weak or tired -vomiting Side effects that usually do not require medical attention (report to your doctor or health care professional if they continue or are  bothersome): -constipation -headache -loss of appetite -nausea -tremors -weight loss This list may not describe all possible side effects. Call your doctor for medical advice about side effects. You may report side effects to FDA at 1-800-FDA-1088. Where should I keep my medicine? Keep out of the reach of children. Store at room temperature between 20 and 25 degrees C (68 and 77 degrees F). Protect from light. Keep container tightly closed. Throw away any unused medicine after the expiration date. NOTE: This sheet is a summary. It may not cover all possible information. If you have questions about this medicine, talk to your doctor, pharmacist, or health care provider.  2018 Elsevier/Gold Standard (2015-09-28 13:49:28)

## 2017-06-26 ENCOUNTER — Other Ambulatory Visit: Payer: Self-pay

## 2017-06-26 DIAGNOSIS — Z716 Tobacco abuse counseling: Secondary | ICD-10-CM

## 2017-06-26 MED ORDER — VARENICLINE TARTRATE 0.5 MG X 11 & 1 MG X 42 PO MISC: ORAL | 0 refills | Status: DC

## 2017-07-20 ENCOUNTER — Ambulatory Visit: Payer: BLUE CROSS/BLUE SHIELD | Admitting: Family Medicine

## 2017-07-20 ENCOUNTER — Encounter: Payer: Self-pay | Admitting: Family Medicine

## 2017-07-20 VITALS — BP 120/82 | HR 84 | Ht 69.0 in | Wt 186.0 lb

## 2017-07-20 DIAGNOSIS — F329 Major depressive disorder, single episode, unspecified: Secondary | ICD-10-CM | POA: Diagnosis not present

## 2017-07-20 DIAGNOSIS — F32A Depression, unspecified: Secondary | ICD-10-CM

## 2017-07-20 DIAGNOSIS — F419 Anxiety disorder, unspecified: Secondary | ICD-10-CM | POA: Diagnosis not present

## 2017-07-20 DIAGNOSIS — I739 Peripheral vascular disease, unspecified: Secondary | ICD-10-CM

## 2017-07-20 DIAGNOSIS — I75021 Atheroembolism of right lower extremity: Secondary | ICD-10-CM | POA: Diagnosis not present

## 2017-07-20 DIAGNOSIS — I10 Essential (primary) hypertension: Secondary | ICD-10-CM | POA: Diagnosis not present

## 2017-07-20 MED ORDER — FLUOXETINE HCL 10 MG PO TABS: 10.0000 mg | ORAL_TABLET | Freq: Every day | ORAL | 1 refills | Status: DC

## 2017-07-20 MED ORDER — BUSPIRONE HCL 5 MG PO TABS: 5.0000 mg | ORAL_TABLET | Freq: Three times a day (TID) | ORAL | 1 refills | Status: DC

## 2017-07-20 MED ORDER — LISINOPRIL-HYDROCHLOROTHIAZIDE 10-12.5 MG PO TABS: 1.0000 | ORAL_TABLET | Freq: Every day | ORAL | 1 refills | Status: DC

## 2017-07-20 MED ORDER — LISINOPRIL-HYDROCHLOROTHIAZIDE 10-12.5 MG PO TABS: 1.0000 | ORAL_TABLET | Freq: Every day | ORAL | 6 refills | Status: DC

## 2017-07-20 MED ORDER — BUSPIRONE HCL 5 MG PO TABS: 5.0000 mg | ORAL_TABLET | Freq: Three times a day (TID) | ORAL | 3 refills | Status: DC

## 2017-07-20 NOTE — Progress Notes (Signed)
Name: Kristina Wright   MRN: [id number]    DOB: October 25, 1955   Date:07/20/2017       Progress Note  Subjective  Chief Complaint  Chief Complaint  Patient presents with  . Hypertension    following up on elevated B/P- started b/p med on 06/08/17  . Depression    has been off prozac x 1 year/ just stopped med by herself. Want to discuss getting back on meds  . Leg Pain    has peripheral vascular disease- "walking up steps, legs killing me"    Hypertension  This is a recurrent problem. The current episode started more than 1 year ago. The problem has been waxing and waning since onset. The problem is controlled. Pertinent negatives include no anxiety, blurred vision, chest pain, headaches, malaise/fatigue, neck pain, orthopnea, palpitations, peripheral edema, PND, shortness of breath or sweats. ("feeling so much better) There are no associated agents to hypertension. Risk factors for coronary artery disease include dyslipidemia, obesity, smoking/tobacco exposure and post-menopausal state. Past treatments include ACE inhibitors. The current treatment provides moderate improvement. There are no compliance problems.  Hypertensive end-organ damage includes PVD. There is no history of angina, kidney disease, CAD/MI, CVA, heart failure, left ventricular hypertrophy or retinopathy. There is no history of chronic renal disease, a hypertension causing med or renovascular disease.  Depression         This is a recurrent problem.  The current episode started more than 1 month ago.   The onset quality is undetermined.   The problem occurs intermittently.The problem is unchanged.  Associated symptoms include no decreased concentration, no fatigue, no helplessness, no hopelessness, does not have insomnia, not irritable, no restlessness, no decreased interest, no appetite change, no body aches, no myalgias, no headaches, no indigestion, not sad and no suicidal ideas.     The symptoms are aggravated by family issues.  Past  treatments include SSRIs - Selective serotonin reuptake inhibitors.  Compliance with treatment is good.  Previous treatment provided mild relief.   Pertinent negatives include no anxiety. Leg Pain   The incident occurred more than 1 week ago. There was no injury mechanism. The pain is present in the right leg. The quality of the pain is described as aching. The pain is moderate. Pertinent negatives include no tingling.    No problem-specific Assessment & Plan notes found for this encounter.   Past Medical History:  Diagnosis Date  . Cancer ([place]) 1989   cervical  . Depression   . Hyperlipidemia   . Hypertension   . Thyroid disease     Past Surgical History:  Procedure Laterality Date  . BREAST CYST ASPIRATION Left     Family History  Problem Relation Age of Onset  . Colon cancer Mother 61  . Colon cancer Maternal Grandmother 78    Social History   Socioeconomic History  . Marital status: Married    Spouse name: Not on file  . Number of children: Not on file  . Years of education: Not on file  . Highest education level: Not on file  Occupational History  . Not on file  Social Needs  . Financial resource strain: Not on file  . Food insecurity:    Worry: Not on file    Inability: Not on file  . Transportation needs:    Medical: Not on file    Non-medical: Not on file  Tobacco Use  . Smoking status: Current Every Day Smoker    Types: Cigarettes  Last attempt to quit: 12/05/2016    Years since quitting: 0.6  . Smokeless tobacco: Current User  Substance and Sexual Activity  . Alcohol use: No  . Drug use: No  . Sexual activity: Not on file  Lifestyle  . Physical activity:    Days per week: Not on file    Minutes per session: Not on file  . Stress: Not on file  Relationships  . Social connections:    Talks on phone: Not on file    Gets together: Not on file    Attends religious service: Not on file    Active member of club or organization: Not on file     Attends meetings of clubs or organizations: Not on file    Relationship status: Not on file  . Intimate partner violence:    Fear of current or ex partner: Not on file    Emotionally abused: Not on file    Physically abused: Not on file    Forced sexual activity: Not on file  Other Topics Concern  . Not on file  Social History Narrative  . Not on file    No Known Allergies  Outpatient Medications Prior to Visit  Medication Sig Dispense Refill  . aspirin EC 81 MG tablet Take 81 mg by mouth daily.    Marland Kitchen atorvastatin (LIPITOR) 10 MG tablet Take 1 tablet (10 mg total) by mouth every morning. 30 tablet 1  . clopidogrel (PLAVIX) 75 MG tablet   0  . levothyroxine (SYNTHROID, LEVOTHROID) 50 MCG tablet Take 1 tablet (50 mcg total) by mouth daily. 30 tablet 1  . Multiple Vitamin (MULTI-VITAMINS) TABS Take by mouth.    . varenicline (CHANTIX STARTING MONTH PAK) 0.5 MG X 11 & 1 MG X 42 tablet Take one 0.5 mg tablet by mouth once daily for 3 days, then increase to one 0.5 mg tablet twice daily for 4 days, then increase to one 1 mg tablet twice daily. 53 tablet 0  . ibuprofen (ADVIL,MOTRIN) 200 MG tablet Take by mouth.    Marland Kitchen lisinopril-hydrochlorothiazide (PRINZIDE,ZESTORETIC) 10-12.5 MG tablet Take 1 tablet by mouth daily. 30 tablet 1  . Cholecalciferol (VITAMIN D) 2000 units tablet Take by mouth.    Marland Kitchen FLUoxetine (PROZAC) 40 MG capsule Take by mouth.     No facility-administered medications prior to visit.     Review of Systems  Constitutional: Negative for appetite change, chills, fatigue, fever, malaise/fatigue and weight loss.  HENT: Negative for ear discharge, ear pain and sore throat.   Eyes: Negative for blurred vision.  Respiratory: Negative for cough, sputum production, shortness of breath and wheezing.   Cardiovascular: Negative for chest pain, palpitations, orthopnea, leg swelling and PND.  Gastrointestinal: Negative for abdominal pain, blood in stool, constipation, diarrhea,  heartburn, melena and nausea.  Genitourinary: Negative for dysuria, frequency, hematuria and urgency.  Musculoskeletal: Negative for back pain, joint pain, myalgias and neck pain.  Skin: Negative for rash.  Neurological: Negative for dizziness, tingling, sensory change, focal weakness and headaches.  Endo/Heme/Allergies: Negative for environmental allergies and polydipsia. Does not bruise/bleed easily.  Psychiatric/Behavioral: Positive for depression. Negative for decreased concentration and suicidal ideas. The patient is not nervous/anxious and does not have insomnia.      Objective  Vitals:   07/20/17 1353  BP: 120/82  Pulse: 84  Weight: 186 lb (84.4 kg)  Height: 5\' 9"  (1.753 m)    Physical Exam  Constitutional: She is well-developed, well-nourished, and in no distress. She is  not irritable. No distress.  HENT:  Head: Normocephalic and atraumatic.  Right Ear: External ear normal.  Left Ear: External ear normal.  Nose: Nose normal.  Mouth/Throat: Oropharynx is clear and moist.  Eyes: Pupils are equal, round, and reactive to light. Conjunctivae and EOM are normal. Right eye exhibits no discharge. Left eye exhibits no discharge.  Neck: Normal range of motion. Neck supple. No JVD present. No thyromegaly present.  Cardiovascular: Normal rate, regular rhythm, S1 normal, S2 normal, normal heart sounds and intact distal pulses. Exam reveals no gallop, no S3, no S4 and no friction rub.  No murmur heard. Pulses:      Dorsalis pedis pulses are 1+ on the right side, and 1+ on the left side.       Posterior tibial pulses are 1+ on the right side, and 1+ on the left side.  Pulmonary/Chest: Effort normal and breath sounds normal. She has no wheezes. She has no rales.  Abdominal: Soft. Bowel sounds are normal. She exhibits no mass. There is no tenderness. There is no guarding.  Musculoskeletal: Normal range of motion. She exhibits no edema.  Lymphadenopathy:    She has no cervical  adenopathy.  Neurological: She is alert. She has normal reflexes.  Skin: Skin is warm and dry. She is not diaphoretic.  Psychiatric: Mood and affect normal.  Nursing note and vitals reviewed.     Assessment & Plan  Problem List Items Addressed This Visit      Cardiovascular and Mediastinum   Essential hypertension - Primary   Relevant Medications   aspirin EC 81 MG tablet   lisinopril-hydrochlorothiazide (PRINZIDE,ZESTORETIC) 10-12.5 MG tablet   PVD (peripheral vascular disease) (HCC)   Relevant Medications   aspirin EC 81 MG tablet   lisinopril-hydrochlorothiazide (PRINZIDE,ZESTORETIC) 10-12.5 MG tablet   Blue toe syndrome of right lower extremity (HCC)   Relevant Medications   aspirin EC 81 MG tablet   lisinopril-hydrochlorothiazide (PRINZIDE,ZESTORETIC) 10-12.5 MG tablet    Other Visit Diagnoses    Anxiety and depression       patient is off fluoxitine   Relevant Medications   FLUoxetine (PROZAC) 10 MG tablet   busPIRone (BUSPAR) 5 MG tablet      Meds ordered this encounter  Medications  . DISCONTD: lisinopril-hydrochlorothiazide (PRINZIDE,ZESTORETIC) 10-12.5 MG tablet    Sig: Take 1 tablet by mouth daily.    Dispense:  30 tablet    Refill:  1  . DISCONTD: lisinopril-hydrochlorothiazide (PRINZIDE,ZESTORETIC) 10-12.5 MG tablet    Sig: Take 1 tablet by mouth daily.    Dispense:  30 tablet    Refill:  6  . DISCONTD: FLUoxetine (PROZAC) 10 MG tablet    Sig: Take 1 tablet (10 mg total) by mouth daily.    Dispense:  30 tablet    Refill:  1  . DISCONTD: busPIRone (BUSPAR) 5 MG tablet    Sig: Take 1 tablet (5 mg total) by mouth 3 (three) times daily.    Dispense:  30 tablet    Refill:  3  . FLUoxetine (PROZAC) 10 MG tablet    Sig: Take 1 tablet (10 mg total) by mouth daily.    Dispense:  90 tablet    Refill:  1    Fill #90  . busPIRone (BUSPAR) 5 MG tablet    Sig: Take 1 tablet (5 mg total) by mouth 3 (three) times daily.    Dispense:  270 tablet    Refill:  1     Fill 3 month  supply  . lisinopril-hydrochlorothiazide (PRINZIDE,ZESTORETIC) 10-12.5 MG tablet    Sig: Take 1 tablet by mouth daily.    Dispense:  90 tablet    Refill:  1    Fill #90      Dr. Otilio Miu [facility]  07/20/17

## 2017-08-11 ENCOUNTER — Other Ambulatory Visit: Payer: Self-pay

## 2017-08-14 ENCOUNTER — Other Ambulatory Visit: Payer: Self-pay | Admitting: Family Medicine

## 2017-09-24 ENCOUNTER — Ambulatory Visit: Payer: BLUE CROSS/BLUE SHIELD | Admitting: Family Medicine

## 2017-09-29 ENCOUNTER — Encounter: Payer: Self-pay | Admitting: Family Medicine

## 2017-09-29 ENCOUNTER — Ambulatory Visit: Payer: BLUE CROSS/BLUE SHIELD | Admitting: Family Medicine

## 2017-09-29 VITALS — BP 120/86 | HR 64 | Ht 69.0 in | Wt 183.0 lb

## 2017-09-29 DIAGNOSIS — F419 Anxiety disorder, unspecified: Secondary | ICD-10-CM | POA: Diagnosis not present

## 2017-09-29 DIAGNOSIS — F32A Depression, unspecified: Secondary | ICD-10-CM

## 2017-09-29 DIAGNOSIS — F329 Major depressive disorder, single episode, unspecified: Secondary | ICD-10-CM | POA: Diagnosis not present

## 2017-09-29 MED ORDER — BUSPIRONE HCL 10 MG PO TABS: 10.0000 mg | ORAL_TABLET | Freq: Two times a day (BID) | ORAL | 1 refills | Status: DC

## 2017-09-29 MED ORDER — FLUOXETINE HCL 20 MG PO TABS: 20.0000 mg | ORAL_TABLET | Freq: Every day | ORAL | 3 refills | Status: DC

## 2017-09-29 NOTE — Progress Notes (Signed)
Name: Kristina Wright   MRN: [id number]    DOB: 1956-01-17   Date:09/29/2017       Progress Note  Subjective  Chief Complaint  Chief Complaint  Patient presents with  . Follow-up    anxiety- "doing better, but still feel like I'm having panic attacks"    Anxiety  Presents for follow-up visit. Symptoms include depressed mood, excessive worry, nervous/anxious behavior and panic. Patient reports no chest pain, compulsions, confusion, decreased concentration, dizziness, dry mouth, feeling of choking, hyperventilation, impotence, insomnia, irritability, malaise, muscle tension, nausea, obsessions, palpitations, restlessness, shortness of breath or suicidal ideas. Symptoms occur occasionally. The severity of symptoms is moderate. The quality of sleep is good.      No problem-specific Assessment & Plan notes found for this encounter.   Past Medical History:  Diagnosis Date  . Cancer ([place]) 1989   cervical  . Depression   . Hyperlipidemia   . Hypertension   . Thyroid disease     Past Surgical History:  Procedure Laterality Date  . BREAST CYST ASPIRATION Left     Family History  Problem Relation Age of Onset  . Colon cancer Mother 39  . Colon cancer Maternal Grandmother 78    Social History   Socioeconomic History  . Marital status: Married    Spouse name: Not on file  . Number of children: Not on file  . Years of education: Not on file  . Highest education level: Not on file  Occupational History  . Not on file  Social Needs  . Financial resource strain: Not on file  . Food insecurity:    Worry: Not on file    Inability: Not on file  . Transportation needs:    Medical: Not on file    Non-medical: Not on file  Tobacco Use  . Smoking status: Current Every Day Smoker    Types: Cigarettes    Last attempt to quit: 12/05/2016    Years since quitting: 0.8  . Smokeless tobacco: Current User  Substance and Sexual Activity  . Alcohol use: No  . Drug use: No  . Sexual  activity: Not on file  Lifestyle  . Physical activity:    Days per week: Not on file    Minutes per session: Not on file  . Stress: Not on file  Relationships  . Social connections:    Talks on phone: Not on file    Gets together: Not on file    Attends religious service: Not on file    Active member of club or organization: Not on file    Attends meetings of clubs or organizations: Not on file    Relationship status: Not on file  . Intimate partner violence:    Fear of current or ex partner: Not on file    Emotionally abused: Not on file    Physically abused: Not on file    Forced sexual activity: Not on file  Other Topics Concern  . Not on file  Social History Narrative  . Not on file    No Known Allergies  Outpatient Medications Prior to Visit  Medication Sig Dispense Refill  . aspirin EC 81 MG tablet Take 81 mg by mouth daily.    Marland Kitchen atorvastatin (LIPITOR) 10 MG tablet TAKE 1 TABLET BY MOUTH ONCE DAILY IN THE MORNING 30 tablet 1  . Cholecalciferol (VITAMIN D) 2000 units tablet Take by mouth.    . clopidogrel (PLAVIX) 75 MG tablet   0  .  levothyroxine (SYNTHROID, LEVOTHROID) 50 MCG tablet TAKE 1 TABLET BY MOUTH ONCE DAILY 30 tablet 1  . lisinopril-hydrochlorothiazide (PRINZIDE,ZESTORETIC) 10-12.5 MG tablet Take 1 tablet by mouth daily. 90 tablet 1  . Multiple Vitamin (MULTI-VITAMINS) TABS Take by mouth.    . varenicline (CHANTIX STARTING MONTH PAK) 0.5 MG X 11 & 1 MG X 42 tablet Take one 0.5 mg tablet by mouth once daily for 3 days, then increase to one 0.5 mg tablet twice daily for 4 days, then increase to one 1 mg tablet twice daily. 53 tablet 0  . busPIRone (BUSPAR) 5 MG tablet Take 1 tablet (5 mg total) by mouth 3 (three) times daily. 270 tablet 1  . FLUoxetine (PROZAC) 10 MG tablet Take 1 tablet (10 mg total) by mouth daily. 90 tablet 1   No facility-administered medications prior to visit.     Review of Systems  Constitutional: Negative for chills, fever,  irritability, malaise/fatigue and weight loss.  HENT: Negative for ear discharge, ear pain and sore throat.   Eyes: Negative for blurred vision.  Respiratory: Negative for cough, sputum production, shortness of breath and wheezing.   Cardiovascular: Negative for chest pain, palpitations and leg swelling.  Gastrointestinal: Negative for abdominal pain, blood in stool, constipation, diarrhea, heartburn, melena and nausea.  Genitourinary: Negative for dysuria, frequency, hematuria, impotence and urgency.  Musculoskeletal: Negative for back pain, joint pain, myalgias and neck pain.  Skin: Negative for rash.  Neurological: Negative for dizziness, tingling, sensory change, focal weakness and headaches.  Endo/Heme/Allergies: Negative for environmental allergies and polydipsia. Does not bruise/bleed easily.  Psychiatric/Behavioral: Negative for confusion, decreased concentration, depression and suicidal ideas. The patient is nervous/anxious. The patient does not have insomnia.      Objective  Vitals:   09/29/17 1517  BP: 120/86  Pulse: 64  Weight: 183 lb (83 kg)  Height: 5\' 9"  (1.753 m)    Physical Exam  Constitutional: She is oriented to person, place, and time. She appears well-developed and well-nourished.  HENT:  Head: Normocephalic.  Right Ear: External ear normal.  Left Ear: External ear normal.  Mouth/Throat: Oropharynx is clear and moist.  Eyes: Pupils are equal, round, and reactive to light. Conjunctivae and EOM are normal. Lids are everted and swept, no foreign bodies found. Left eye exhibits no hordeolum. No foreign body present in the left eye. Right conjunctiva is not injected. Left conjunctiva is not injected. No scleral icterus.  Neck: Normal range of motion. Neck supple. No JVD present. No tracheal deviation present. No thyromegaly present.  Cardiovascular: Normal rate, regular rhythm, normal heart sounds and intact distal pulses. Exam reveals no gallop and no friction rub.   No murmur heard. Pulmonary/Chest: Effort normal and breath sounds normal. No respiratory distress. She has no wheezes. She has no rales.  Abdominal: Soft. Bowel sounds are normal. She exhibits no mass. There is no hepatosplenomegaly. There is no tenderness. There is no rebound and no guarding.  Musculoskeletal: Normal range of motion. She exhibits no edema or tenderness.  Lymphadenopathy:    She has no cervical adenopathy.  Neurological: She is alert and oriented to person, place, and time. She has normal strength. She displays normal reflexes. No cranial nerve deficit.  Skin: Skin is warm. No rash noted.  Psychiatric: She has a normal mood and affect. Her mood appears not anxious. She does not exhibit a depressed mood.  Nursing note and vitals reviewed.     Assessment & Plan  Problem List Items Addressed This Visit  None    Visit Diagnoses    Anxiety and depression    -  Primary   Partial control on current dosage. Will increase fluoxetine to 20mg  and buspirone to 10 mg bid. will recheck in 6 weeks.   Relevant Medications   FLUoxetine (PROZAC) 20 MG tablet   busPIRone (BUSPAR) 10 MG tablet      Meds ordered this encounter  Medications  . FLUoxetine (PROZAC) 20 MG tablet    Sig: Take 1 tablet (20 mg total) by mouth daily.    Dispense:  30 tablet    Refill:  3  . busPIRone (BUSPAR) 10 MG tablet    Sig: Take 1 tablet (10 mg total) by mouth 2 (two) times daily.    Dispense:  60 tablet    Refill:  1      Dr. Otilio Miu [facility]  09/29/17

## 2017-12-29 ENCOUNTER — Other Ambulatory Visit: Payer: Self-pay | Admitting: Family Medicine

## 2017-12-29 DIAGNOSIS — I10 Essential (primary) hypertension: Secondary | ICD-10-CM

## 2018-01-06 ENCOUNTER — Other Ambulatory Visit: Payer: Self-pay

## 2018-01-07 ENCOUNTER — Ambulatory Visit
Admission: RE | Admit: 2018-01-07 | Discharge: 2018-01-07 | Disposition: A | Discharge status: Home / Self Care | Payer: BLUE CROSS/BLUE SHIELD | Source: Ambulatory Visit | Attending: Family Medicine | Admitting: Family Medicine

## 2018-01-07 ENCOUNTER — Encounter: Payer: Self-pay | Admitting: Family Medicine

## 2018-01-07 ENCOUNTER — Ambulatory Visit: Payer: BLUE CROSS/BLUE SHIELD | Admitting: Family Medicine

## 2018-01-07 VITALS — BP 132/82 | HR 64 | Ht 69.0 in | Wt 180.0 lb

## 2018-01-07 DIAGNOSIS — E039 Hypothyroidism, unspecified: Secondary | ICD-10-CM

## 2018-01-07 DIAGNOSIS — I10 Essential (primary) hypertension: Secondary | ICD-10-CM | POA: Diagnosis not present

## 2018-01-07 DIAGNOSIS — E785 Hyperlipidemia, unspecified: Secondary | ICD-10-CM

## 2018-01-07 DIAGNOSIS — I739 Peripheral vascular disease, unspecified: Secondary | ICD-10-CM

## 2018-01-07 DIAGNOSIS — R079 Chest pain, unspecified: Secondary | ICD-10-CM

## 2018-01-07 DIAGNOSIS — R918 Other nonspecific abnormal finding of lung field: Secondary | ICD-10-CM | POA: Diagnosis not present

## 2018-01-07 DIAGNOSIS — R0602 Shortness of breath: Secondary | ICD-10-CM | POA: Diagnosis not present

## 2018-01-07 MED ORDER — CLOPIDOGREL BISULFATE 75 MG PO TABS: 75.0000 mg | ORAL_TABLET | Freq: Every day | ORAL | 1 refills | Status: DC

## 2018-01-07 NOTE — Progress Notes (Signed)
Date:  01/07/2018   Name:  Kristina Wright   DOB:  1956/01/21   MRN:  [id number]   Chief Complaint: Hyperlipidemia; Hypothyroidism (been off med x 5 days); Anxiety; and Hypertension Thyroid Problem  Presents for follow-up visit. Patient reports no anxiety, cold intolerance, constipation, depressed mood, diaphoresis, diarrhea, dry skin, fatigue, hair loss, heat intolerance, hoarse voice, leg swelling, menstrual problem, nail problem, palpitations, tremors, visual change, weight gain or weight loss. The symptoms have been stable.  Shortness of Breath  This is a recurrent problem. The current episode started in the past 7 days. The problem occurs daily. The problem has been gradually improving. Associated symptoms include chest pain, leg pain and neck pain. Pertinent negatives include no abdominal pain, claudication, coryza, ear pain, fever, headaches, hemoptysis, leg swelling, orthopnea, PND, rash, rhinorrhea, sore throat, sputum production, swollen glands, syncope, vomiting or wheezing. The symptoms are aggravated by emotional upset. Risk factors include smoking. She has tried nothing for the symptoms. The treatment provided mild relief.  Chest Pain   This is a recurrent problem. The current episode started 1 to 4 weeks ago. The onset quality is gradual. The problem has been waxing and waning. The pain is present in the substernal region. The pain is at a severity of 4/10. The pain is mild. The quality of the pain is described as pressure. The pain does not radiate. Associated symptoms include leg pain and shortness of breath. Pertinent negatives include no abdominal pain, back pain, claudication, cough, diaphoresis, dizziness, exertional chest pressure, fever, headaches, hemoptysis, nausea, numbness, orthopnea, palpitations, PND, sputum production, syncope, vomiting or weakness.  Her past medical history is significant for thyroid problem.     Review of Systems  Constitutional: Negative.  Negative  for chills, diaphoresis, fatigue, fever, unexpected weight change, weight gain and weight loss.  HENT: Negative for congestion, ear discharge, ear pain, hoarse voice, rhinorrhea, sinus pressure, sneezing and sore throat.   Eyes: Negative for photophobia, pain, discharge, redness and itching.  Respiratory: Positive for shortness of breath. Negative for cough, hemoptysis, sputum production, wheezing and stridor.   Cardiovascular: Positive for chest pain. Negative for palpitations, orthopnea, claudication, leg swelling, syncope and PND.  Gastrointestinal: Negative for abdominal pain, blood in stool, constipation, diarrhea, nausea and vomiting.  Endocrine: Negative for cold intolerance, heat intolerance, polydipsia, polyphagia and polyuria.  Genitourinary: Negative for dysuria, flank pain, frequency, hematuria, menstrual problem, pelvic pain, urgency, vaginal bleeding and vaginal discharge.  Musculoskeletal: Positive for neck pain. Negative for arthralgias, back pain and myalgias.  Skin: Negative for rash.  Allergic/Immunologic: Negative for environmental allergies and food allergies.  Neurological: Negative for dizziness, tremors, weakness, light-headedness, numbness and headaches.  Hematological: Negative for adenopathy. Does not bruise/bleed easily.  Psychiatric/Behavioral: Negative for dysphoric mood. The patient is not nervous/anxious.     Patient Active Problem List   Diagnosis Date Noted  . Tobacco use disorder 02/08/2016  . Essential hypertension 02/08/2016  . PVD (peripheral vascular disease) ([place]) 02/08/2016  . Blue toe syndrome of right lower extremity ([place]) 02/08/2016    No Known Allergies  Past Surgical History:  Procedure Laterality Date  . BREAST CYST ASPIRATION Left     Social History   Tobacco Use  . Smoking status: Former Smoker    Types: Cigarettes    Last attempt to quit: 11/19/2017    Years since quitting: 0.1  . Smokeless tobacco: Never Used  Substance Use  Topics  . Alcohol use: No  . Drug use: No  Medication list has been reviewed and updated.  Current Meds  Medication Sig  . aspirin EC 81 MG tablet Take 81 mg by mouth daily.  Marland Kitchen atorvastatin (LIPITOR) 10 MG tablet TAKE 1 TABLET BY MOUTH ONCE DAILY IN THE MORNING  . busPIRone (BUSPAR) 10 MG tablet Take 1 tablet (10 mg total) by mouth 2 (two) times daily.  . clopidogrel (PLAVIX) 75 MG tablet Take 1 tablet (75 mg total) by mouth daily.  Marland Kitchen FLUoxetine (PROZAC) 20 MG tablet Take 1 tablet (20 mg total) by mouth daily.  Marland Kitchen levothyroxine (SYNTHROID, LEVOTHROID) 50 MCG tablet TAKE 1 TABLET BY MOUTH ONCE DAILY  . lisinopril-hydrochlorothiazide (PRINZIDE,ZESTORETIC) 10-12.5 MG tablet Take 1 tablet by mouth daily.  . Multiple Vitamin (MULTI-VITAMINS) TABS Take by mouth.  . [DISCONTINUED] clopidogrel (PLAVIX) 75 MG tablet     PHQ 2/9 Scores 01/07/2018 09/29/2017 07/20/2017  PHQ - 2 Score 1 2 6   PHQ- 9 Score 6 4 18     Physical Exam  Constitutional: She is oriented to person, place, and time. She appears well-developed and well-nourished.  HENT:  Head: Normocephalic.  Right Ear: External ear normal.  Left Ear: External ear normal.  Mouth/Throat: Oropharynx is clear and moist.  Eyes: Pupils are equal, round, and reactive to light. Conjunctivae and EOM are normal. Lids are everted and swept, no foreign bodies found. Left eye exhibits no hordeolum. No foreign body present in the left eye. Right conjunctiva is not injected. Left conjunctiva is not injected. No scleral icterus.  Neck: Normal range of motion. Neck supple. No JVD present. No tracheal deviation present. No thyromegaly present.  Cardiovascular: Normal rate, regular rhythm, normal heart sounds and intact distal pulses. Exam reveals no gallop and no friction rub.  No murmur heard. Pulmonary/Chest: Effort normal and breath sounds normal. No respiratory distress. She has no wheezes. She has no rales.  Abdominal: Soft. Bowel sounds are normal.  She exhibits no mass. There is no hepatosplenomegaly. There is no tenderness. There is no rebound and no guarding.  Musculoskeletal: Normal range of motion. She exhibits no edema or tenderness.  Lymphadenopathy:    She has no cervical adenopathy.  Neurological: She is alert and oriented to person, place, and time. She has normal strength. She displays normal reflexes. No cranial nerve deficit.  Skin: Skin is warm. No rash noted.  Psychiatric: She has a normal mood and affect. Her mood appears not anxious. She does not exhibit a depressed mood.  Nursing note and vitals reviewed.   BP 132/82   Pulse 64   Ht 5\' 9"  (1.753 m)   Wt 180 lb (81.6 kg)   BMI 26.58 kg/m   Assessment and Plan: 1. Chest pain, unspecified type EKG ran and ordered chest xray - EKG 12-Lead - DG Chest 2 View; Future  2. Hypothyroidism, unspecified type Start patient back on Levothyroxine 50mcg- recheck tsh in 4 weeks  3. Shortness of breath EKG performed and chest xray ordered  4. Essential hypertension Continue on lisinopril-hctz  5. PVD (peripheral vascular disease) ([place]) Start back on clopidogrel - clopidogrel (PLAVIX) 75 MG tablet; Take 1 tablet (75 mg total) by mouth daily.  Dispense: 30 tablet; Refill: 1  6. Hyperlipidemia, unspecified hyperlipidemia type Conitinue atorvastatin- stable on meds    Dr. Otilio Miu [facility]  01/07/2018

## 2018-01-18 ENCOUNTER — Other Ambulatory Visit: Payer: Self-pay

## 2018-01-18 DIAGNOSIS — J441 Chronic obstructive pulmonary disease with (acute) exacerbation: Secondary | ICD-10-CM

## 2018-01-18 DIAGNOSIS — R918 Other nonspecific abnormal finding of lung field: Secondary | ICD-10-CM

## 2018-01-18 MED ORDER — ALBUTEROL SULFATE HFA 108 (90 BASE) MCG/ACT IN AERS: 2.0000 | INHALATION_SPRAY | Freq: Four times a day (QID) | RESPIRATORY_TRACT | 0 refills | Status: DC | PRN

## 2018-01-18 NOTE — Progress Notes (Unsigned)
PA- [id number] exp 02/16/18

## 2018-01-22 ENCOUNTER — Telehealth: Payer: Self-pay

## 2018-01-22 ENCOUNTER — Other Ambulatory Visit: Payer: BLUE CROSS/BLUE SHIELD

## 2018-01-22 ENCOUNTER — Other Ambulatory Visit: Payer: Self-pay

## 2018-01-22 DIAGNOSIS — R918 Other nonspecific abnormal finding of lung field: Secondary | ICD-10-CM

## 2018-01-22 DIAGNOSIS — I10 Essential (primary) hypertension: Secondary | ICD-10-CM

## 2018-01-22 DIAGNOSIS — E039 Hypothyroidism, unspecified: Secondary | ICD-10-CM

## 2018-01-22 DIAGNOSIS — R69 Illness, unspecified: Secondary | ICD-10-CM

## 2018-01-22 DIAGNOSIS — E7849 Other hyperlipidemia: Secondary | ICD-10-CM

## 2018-01-22 NOTE — Telephone Encounter (Signed)
Called pt and gave her the appt for Dr. Tasia Catchings on Oct 10th @ 8:30 in [place]- spoke to pt

## 2018-01-22 NOTE — Progress Notes (Unsigned)
Put in referral.  

## 2018-01-23 LAB — HEPATIC FUNCTION PANEL
ALT: 13 IU/L (ref 0–32)
AST: 13 IU/L (ref 0–40)
Alkaline Phosphatase: 106 IU/L (ref 39–117)
Bilirubin Total: 0.3 mg/dL (ref 0.0–1.2)
Bilirubin, Direct: 0.09 mg/dL (ref 0.00–0.40)
Total Protein: 7 g/dL (ref 6.0–8.5)

## 2018-01-23 LAB — RENAL FUNCTION PANEL
Albumin: 3.9 g/dL (ref 3.5–5.5)
BUN / CREAT RATIO: 13 (ref 9–23)
BUN: 11 mg/dL (ref 6–24)
CHLORIDE: 99 mmol/L (ref 96–106)
CO2: 23 mmol/L (ref 20–29)
Calcium: 9.6 mg/dL (ref 8.7–10.2)
Creatinine, Ser: 0.82 mg/dL (ref 0.57–1.00)
GFR calc non Af Amer: 79 mL/min/{1.73_m2} (ref >59)
GFR, EST AFRICAN AMERICAN: 91 mL/min/{1.73_m2} (ref >59)
Glucose: 100 mg/dL — ABNORMAL HIGH (ref 65–99)
Phosphorus: 4.5 mg/dL (ref 2.5–4.5)
Potassium: 4.1 mmol/L (ref 3.5–5.2)
Sodium: 140 mmol/L (ref 134–144)

## 2018-01-23 LAB — LIPID PANEL WITH LDL/HDL RATIO
Cholesterol, Total: 169 mg/dL (ref 100–199)
HDL: 37 mg/dL — ABNORMAL LOW (ref >39)
LDL CALC: 88 mg/dL (ref 0–99)
LDL/HDL RATIO: 2.4 ratio (ref 0.0–3.2)
Triglycerides: 221 mg/dL — ABNORMAL HIGH (ref 0–149)
VLDL CHOLESTEROL CAL: 44 mg/dL — AB (ref 5–40)

## 2018-01-23 LAB — TSH: TSH: 12.03 u[IU]/mL — ABNORMAL HIGH (ref 0.450–4.500)

## 2018-01-25 ENCOUNTER — Other Ambulatory Visit: Payer: Self-pay

## 2018-01-25 ENCOUNTER — Encounter: Payer: Self-pay | Admitting: Family Medicine

## 2018-01-25 DIAGNOSIS — E039 Hypothyroidism, unspecified: Secondary | ICD-10-CM

## 2018-01-25 MED ORDER — LEVOTHYROXINE SODIUM 75 MCG PO TABS: 75.0000 ug | ORAL_TABLET | Freq: Every day | ORAL | 1 refills | Status: DC

## 2018-01-28 ENCOUNTER — Encounter: Payer: Self-pay | Admitting: Oncology

## 2018-01-28 ENCOUNTER — Inpatient Hospital Stay: Payer: BLUE CROSS/BLUE SHIELD | Attending: Oncology | Admitting: Oncology

## 2018-01-28 ENCOUNTER — Other Ambulatory Visit: Payer: Self-pay

## 2018-01-28 VITALS — BP 144/79 | HR 81 | Temp 97.1°F | Resp 16 | Wt 179.3 lb

## 2018-01-28 DIAGNOSIS — R590 Localized enlarged lymph nodes: Secondary | ICD-10-CM | POA: Insufficient documentation

## 2018-01-28 DIAGNOSIS — R911 Solitary pulmonary nodule: Secondary | ICD-10-CM

## 2018-01-28 NOTE — Progress Notes (Addendum)
Hematology/Oncology Consult note [facility] [phone] [phone]   Patient Care Team: Juline Patch, MD as PCP - General (Family Medicine) Telford Nab, RN as Registered Nurse  REFERRING PROVIDER: Juline Patch, MD CHIEF COMPLAINTS/REASON FOR VISIT:  Evaluation of abnormal CT/lung mass/lymphadenopathy  HISTORY OF PRESENTING ILLNESS:  Kristina Wright is a  62 y.o.  female with PMH listed below who was referred to me for evaluation of lung nodule Patient recently saw chronic care physician for evaluation of shortness of breath, intermittent.  Symptoms are aggravated by emotional upset. Also chest pain, 4 out of 10, substernal region.  No radiation.  As work-up, patient had chest x-ray work-up which showed scattered nodular appearing densities in the bases.  Noncontrast CT is recommended for evaluation. Outside CT 01/19/2018 chest CT without contrast showed slightly irregular pulmonary nodule within the anterior left lower lobe, measuring 8.7 x 5.8 mm and is suspicious requiring follow-up or further evaluation.  There are no other concerning pulmonary nodules.  There is a focal pleural nodule at the right middle lobe minor fissure measures 3.1 mm thickness and 9.2 mm in length.  This is most likely a benign linear scar or lymphoid tissue of the pleura.  Mediastinal adenopathy with a precarinal lymph node mass measuring 1.7 x 3.7 cm.  There is also a right hilar lymph node measuring approximately 1.7 cm.  Difficult to measure without contrast. Patient was referred to cancer center for further evaluation. She is accompanied by her sister to the clinic today. Denies any hemoptysis, shortness of breath more than baseline, wheezing, coughing, chest pain, abdominal pain, headache, double vision. She appears really nervous and anxious about the abnormal findings on CT scan.  She has a history of depression and anxiety.  Takes BuSpar, Prozac.   She has a  history of 30-year pack smoking history, quit 6 months ago.  Weight loss about 4 pounds in the past 3 months. Significant family history of cancer.  Her sister follows up with me for breast cancer.  Other family member cancer history listed in the family history section.  Self-report history of cervical cancer in 1989.  Review of Systems  Constitutional: Positive for malaise/fatigue. Negative for chills and fever.  HENT: Negative for nosebleeds and sore throat.   Eyes: Negative for double vision, photophobia and redness.  Respiratory: Positive for shortness of breath. Negative for cough and wheezing.   Cardiovascular: Negative for chest pain, palpitations and orthopnea.  Gastrointestinal: Negative for abdominal pain, blood in stool, nausea and vomiting.  Genitourinary: Negative for dysuria.  Musculoskeletal: Negative for back pain, myalgias and neck pain.  Skin: Negative for itching and rash.  Neurological: Negative for dizziness, tingling and tremors.  Endo/Heme/Allergies: Negative for environmental allergies. Does not bruise/bleed easily.  Psychiatric/Behavioral: Negative for depression. The patient is nervous/anxious.     MEDICAL HISTORY:  Past Medical History:  Diagnosis Date  . Cancer ([place]) 1989   cervical  . Depression   . Hyperlipidemia   . Hypertension   . Thyroid disease     SURGICAL HISTORY: Past Surgical History:  Procedure Laterality Date  . BILATERAL CARPAL TUNNEL RELEASE    . BREAST CYST ASPIRATION Left   . PARTIAL HYSTERECTOMY    . TUBAL LIGATION      SOCIAL HISTORY: Social History   Socioeconomic History  . Marital status: Divorced    Spouse name: Not on file  . Number of children: 3  . Years of education: Not on file  .  Highest education level: Not on file  Occupational History  . Not on file  Social Needs  . Financial resource strain: Not on file  . Food insecurity:    Worry: Not on file    Inability: Not on file  . Transportation needs:     Medical: Not on file    Non-medical: Not on file  Tobacco Use  . Smoking status: Former Smoker    Packs/day: 1.00    Years: 25.00    Pack years: 25.00    Types: Cigarettes    Last attempt to quit: 11/19/2017    Years since quitting: 0.1  . Smokeless tobacco: Never Used  Substance and Sexual Activity  . Alcohol use: No  . Drug use: No  . Sexual activity: Not on file  Lifestyle  . Physical activity:    Days per week: Not on file    Minutes per session: Not on file  . Stress: Not on file  Relationships  . Social connections:    Talks on phone: Not on file    Gets together: Not on file    Attends religious service: Not on file    Active member of club or organization: Not on file    Attends meetings of clubs or organizations: Not on file    Relationship status: Not on file  . Intimate partner violence:    Fear of current or ex partner: Not on file    Emotionally abused: Not on file    Physically abused: Not on file    Forced sexual activity: Not on file  Other Topics Concern  . Not on file  Social History Narrative  . Not on file    FAMILY HISTORY: Family History  Problem Relation Age of Onset  . Colon cancer Mother 36  . Colon cancer Maternal Grandmother 78  . Hypertension Father   . Breast cancer Sister   . Throat cancer Brother   . Heart attack Maternal Aunt   . Ovarian cancer Maternal Aunt   . Stomach cancer Paternal Grandmother   . Throat cancer Brother     ALLERGIES:  has No Known Allergies.  MEDICATIONS:  Current Outpatient Medications  Medication Sig Dispense Refill  . albuterol (PROVENTIL HFA;VENTOLIN HFA) 108 (90 Base) MCG/ACT inhaler Inhale 2 puffs into the lungs every 6 (six) hours as needed for wheezing or shortness of breath. 1 Inhaler 0  . aspirin EC 81 MG tablet Take 81 mg by mouth daily.    Marland Kitchen atorvastatin (LIPITOR) 10 MG tablet TAKE 1 TABLET BY MOUTH ONCE DAILY IN THE MORNING 30 tablet 1  . busPIRone (BUSPAR) 10 MG tablet Take 1 tablet (10 mg  total) by mouth 2 (two) times daily. 60 tablet 1  . clopidogrel (PLAVIX) 75 MG tablet Take 1 tablet (75 mg total) by mouth daily. 30 tablet 1  . FLUoxetine (PROZAC) 20 MG tablet Take 1 tablet (20 mg total) by mouth daily. 30 tablet 3  . levothyroxine (SYNTHROID, LEVOTHROID) 75 MCG tablet Take 1 tablet (75 mcg total) by mouth daily. 30 tablet 1  . lisinopril-hydrochlorothiazide (PRINZIDE,ZESTORETIC) 10-12.5 MG tablet Take 1 tablet by mouth daily. 90 tablet 1  . Multiple Vitamin (MULTI-VITAMINS) TABS Take by mouth.     No current facility-administered medications for this visit.      PHYSICAL EXAMINATION: ECOG PERFORMANCE STATUS: 1 - Symptomatic but completely ambulatory Vitals:   01/28/18 0858  BP: (!) 144/79  Pulse: 81  Resp: 16  Temp: (!) 97.1 F (36.2  C)   Filed Weights   01/28/18 0858  Weight: 179 lb 5.5 oz (81.4 kg)    Physical Exam  Constitutional: She is oriented to person, place, and time. No distress.  HENT:  Head: Normocephalic and atraumatic.  Mouth/Throat: Oropharynx is clear and moist.  Eyes: Pupils are equal, round, and reactive to light. EOM are normal. No scleral icterus.  Neck: Normal range of motion. Neck supple.  Cardiovascular: Normal rate, regular rhythm and normal heart sounds.  Pulmonary/Chest: Effort normal. No respiratory distress. She has no wheezes.  Decreased breath sound bilaterally.  Abdominal: Soft. Bowel sounds are normal. She exhibits no distension and no mass. There is no tenderness.  Musculoskeletal: Normal range of motion. She exhibits no edema or deformity.  Neurological: She is alert and oriented to person, place, and time. No cranial nerve deficit. Coordination normal.  Skin: Skin is warm and dry. No rash noted. No erythema.  Psychiatric:  Anxious     LABORATORY DATA:  I have reviewed the data as listed No results found for: WBC, HGB, HCT, MCV, PLT Recent Labs    01/22/18 1353  NA 140  K 4.1  CL 99  CO2 23  GLUCOSE 100*    BUN 11  CREATININE 0.82  CALCIUM 9.6  GFRNONAA 79  GFRAA 91  PROT 7.0  ALBUMIN 3.9  AST 13  ALT 13  ALKPHOS 106  BILITOT 0.3  BILIDIR 0.09   Iron/TIBC/Ferritin/ %Sat No results found for: IRON, TIBC, FERRITIN, IRONPCTSAT      ASSESSMENT & PLAN:  1. Lung nodule   2. Mediastinal lymphadenopathy    CT image report was reviewed and discussed with patient.  Will obtain CT images to be uploaded. Per report she has an subcentimeter irregular lung nodule as well as mediastinal adenopathy and hilar adenopathy. I recommend proceeding with a PET scan to evaluate metabolic activities as well as distant metastasis. We will present her case on tumor board next week for discussion of biopsy modality. Likely need bronchoscopy biopsy  Plan brain MRI after PET scan if confirmed mediastinal adenopathy hyper metabolic activity. Follow-up appointment to be determined based on image findings and discussion on tumor board. Notify lung cancer RN navigator Randall for coordination of her care.  Orders Placed This Encounter  Procedures  . NM PET Image Initial (PI) Skull Base To Thigh    Standing Status:   Future    Standing Expiration Date:   01/29/2019    Order Specific Question:   If indicated for the ordered procedure, I authorize the administration of a radiopharmaceutical per Radiology protocol    Answer:   Yes    Order Specific Question:   Preferred imaging location?    Answer:   [facility]    Order Specific Question:   Radiology Contrast Protocol - do NOT remove file path    Answer:   [email or web address]    Order Specific Question:   ** REASON FOR EXAM (FREE TEXT)    Answer:   growing lung nodule and mediastinal Ln.    Order Specific Question:   Is the patient pregnant?    Answer:   No    All questions were answered. The patient knows to call the clinic with any problems questions or concerns.  Return of visit: TBD Thank you for this kind referral and  the opportunity to participate in the care of this patient. A copy of today's note is routed to referring provider  Total face to face encounter time for  this patient visit was 45 min. >50% of the time was  spent in counseling and coordination of care.    Earlie Server, MD, PhD Hematology Oncology [facility] Pager- [phone] 01/28/2018

## 2018-01-28 NOTE — Progress Notes (Signed)
Patient here today as a new patient  

## 2018-02-02 ENCOUNTER — Other Ambulatory Visit: Payer: Self-pay | Admitting: Oncology

## 2018-02-02 ENCOUNTER — Ambulatory Visit
Admission: RE | Admit: 2018-02-02 | Discharge: 2018-02-02 | Disposition: A | Discharge status: Home / Self Care | Payer: Self-pay | Source: Ambulatory Visit | Attending: Oncology | Admitting: Oncology

## 2018-02-02 DIAGNOSIS — R911 Solitary pulmonary nodule: Secondary | ICD-10-CM

## 2018-02-04 ENCOUNTER — Ambulatory Visit: Payer: BLUE CROSS/BLUE SHIELD

## 2018-02-09 ENCOUNTER — Encounter
Admission: RE | Admit: 2018-02-09 | Discharge: 2018-02-09 | Disposition: A | Discharge status: Home / Self Care | Payer: BLUE CROSS/BLUE SHIELD | Source: Ambulatory Visit | Attending: Oncology | Admitting: Oncology

## 2018-02-09 DIAGNOSIS — R590 Localized enlarged lymph nodes: Secondary | ICD-10-CM | POA: Diagnosis present

## 2018-02-09 DIAGNOSIS — R911 Solitary pulmonary nodule: Secondary | ICD-10-CM | POA: Diagnosis present

## 2018-02-09 LAB — GLUCOSE, CAPILLARY: Glucose-Capillary: 103 mg/dL — ABNORMAL HIGH (ref 70–99)

## 2018-02-09 MED ORDER — FLUDEOXYGLUCOSE F - 18 (FDG) INJECTION
9.3000 | Freq: Once | INTRAVENOUS | Status: AC | PRN
  Administered 2018-02-09: 9.72 via INTRAVENOUS

## 2018-02-10 ENCOUNTER — Ambulatory Visit
Admission: RE | Admit: 2018-02-10 | Discharge: 2018-02-10 | Disposition: A | Discharge status: Home / Self Care | Payer: BLUE CROSS/BLUE SHIELD | Source: Ambulatory Visit | Attending: Oncology | Admitting: Oncology

## 2018-02-10 ENCOUNTER — Other Ambulatory Visit: Payer: Self-pay

## 2018-02-10 ENCOUNTER — Other Ambulatory Visit: Payer: Self-pay | Admitting: Oncology

## 2018-02-10 ENCOUNTER — Telehealth: Payer: Self-pay | Admitting: Oncology

## 2018-02-10 DIAGNOSIS — N63 Unspecified lump in unspecified breast: Secondary | ICD-10-CM

## 2018-02-10 DIAGNOSIS — R928 Other abnormal and inconclusive findings on diagnostic imaging of breast: Secondary | ICD-10-CM

## 2018-02-10 DIAGNOSIS — R911 Solitary pulmonary nodule: Secondary | ICD-10-CM

## 2018-02-10 DIAGNOSIS — R590 Localized enlarged lymph nodes: Secondary | ICD-10-CM

## 2018-02-10 NOTE — Telephone Encounter (Signed)
PET scan results communicated with patient over the phone.  Recommend proceed with mammogram. She agrees with the plan.  Discussed with Hayley and she will arrange.

## 2018-02-11 ENCOUNTER — Inpatient Hospital Stay (HOSPITAL_BASED_OUTPATIENT_CLINIC_OR_DEPARTMENT_OTHER): Payer: BLUE CROSS/BLUE SHIELD | Admitting: Oncology

## 2018-02-11 ENCOUNTER — Encounter: Payer: Self-pay | Admitting: Oncology

## 2018-02-11 ENCOUNTER — Other Ambulatory Visit: Payer: Self-pay

## 2018-02-11 ENCOUNTER — Encounter: Payer: Self-pay | Admitting: *Deleted

## 2018-02-11 VITALS — BP 163/96 | HR 75 | Temp 96.4°F | Wt 183.0 lb

## 2018-02-11 DIAGNOSIS — R911 Solitary pulmonary nodule: Secondary | ICD-10-CM | POA: Diagnosis not present

## 2018-02-11 DIAGNOSIS — R590 Localized enlarged lymph nodes: Secondary | ICD-10-CM | POA: Diagnosis not present

## 2018-02-11 MED ORDER — ALPRAZOLAM 0.25 MG PO TABS: 0.2500 mg | ORAL_TABLET | Freq: Two times a day (BID) | ORAL | 0 refills | Status: DC | PRN

## 2018-02-11 NOTE — Progress Notes (Signed)
Oncology Nurse Navigator Documentation    Oncology Nurse Navigator Documentation  Navigator Location: [facility] (02/11/18 0900)   )Navigator Encounter Type: Clinic/MDC (02/11/18 0900)                                                    Time Spent with Patient: 45 (02/11/18 0900)   Met patient today to establish navigation services.  Patient is currently being worked up for possible breast cancer.  Offered support.  Patient is to call if she has any questions or needs.

## 2018-02-11 NOTE — Progress Notes (Signed)
Hematology/Oncology follow up note [facility] [phone] [phone]   Patient Care Team: Juline Patch, MD as PCP - General (Family Medicine) Telford Nab, RN as Registered Nurse  REFERRING PROVIDER: Juline Patch, MD CHIEF COMPLAINTS/REASON FOR VISIT:  Evaluation of abnormal CT, lung mass/lymphadenopathy.   HISTORY OF PRESENTING ILLNESS:  Kristina Wright is a  62 y.o.  female with PMH listed below who was referred to me for evaluation of lung nodule Patient recently saw chronic care physician for evaluation of shortness of breath, intermittent.  Symptoms are aggravated by emotional upset. Also chest pain, 4 out of 10, substernal region.  No radiation.  As work-up, patient had chest x-ray work-up which showed scattered nodular appearing densities in the bases.  Noncontrast CT is recommended for evaluation. Outside CT 01/19/2018 chest CT without contrast showed slightly irregular pulmonary nodule within the anterior left lower lobe, measuring 8.7 x 5.8 mm and is suspicious requiring follow-up or further evaluation.  There are no other concerning pulmonary nodules.  There is a focal pleural nodule at the right middle lobe minor fissure measures 3.1 mm thickness and 9.2 mm in length.  This is most likely a benign linear scar or lymphoid tissue of the pleura.  Mediastinal adenopathy with a precarinal lymph node mass measuring 1.7 x 3.7 cm.  There is also a right hilar lymph node measuring approximately 1.7 cm.  Difficult to measure without contrast. Patient was referred to cancer center for further evaluation. She is accompanied by her sister to the clinic today. Denies any hemoptysis, shortness of breath more than baseline, wheezing, coughing, chest pain, abdominal pain, headache, double vision. She appears really nervous and anxious about the abnormal findings on CT scan.  She has a history of depression and anxiety.  Takes BuSpar, Prozac.   She  has a history of 30-year pack smoking history, quit 6 months ago.  Weight loss about 4 pounds in the past 3 months. Significant family history of cancer.  Her sister follows up with me for breast cancer.  Other family member cancer history listed in the family history section.  Self-report history of cervical cancer in 1989.  INTERVAL HISTORY Kristina Wright is a 62 y.o. female who has above history reviewed by me today presents for follow up visit for management of lung nodule, lymphadenopathy/abnormal CT. During interval, she has had PET scan done and I have called patient about results. Patient feels extremely anxious and requests a visit to discuss about image findings and future management plan.  Reports feeing anxious,not able to sleep well. She is tearful.  She takes buspar, prozac for depression. Requests a prescription of Xanax which worked for her in the past.  Denies any suicidal thoughts. Chronic SOB at baseline,   Review of Systems  Constitutional: Positive for malaise/fatigue. Negative for chills and fever.  HENT: Negative for nosebleeds and sore throat.   Eyes: Negative for double vision, photophobia and redness.  Respiratory: Positive for shortness of breath. Negative for cough and wheezing.   Cardiovascular: Negative for chest pain, palpitations and orthopnea.  Gastrointestinal: Negative for abdominal pain, blood in stool, nausea and vomiting.  Genitourinary: Negative for dysuria.  Musculoskeletal: Negative for back pain, myalgias and neck pain.  Skin: Negative for itching and rash.  Neurological: Negative for dizziness, tingling and tremors.  Endo/Heme/Allergies: Negative for environmental allergies. Does not bruise/bleed easily.  Psychiatric/Behavioral: Negative for depression. The patient is nervous/anxious.     MEDICAL HISTORY:  Past Medical  History:  Diagnosis Date  . Cancer ([place]) 1989   cervical  . Depression   . Hyperlipidemia   . Hypertension   . Thyroid  disease     SURGICAL HISTORY: Past Surgical History:  Procedure Laterality Date  . BILATERAL CARPAL TUNNEL RELEASE    . BREAST CYST ASPIRATION Left   . PARTIAL HYSTERECTOMY    . TUBAL LIGATION      SOCIAL HISTORY: Social History   Socioeconomic History  . Marital status: Divorced    Spouse name: Not on file  . Number of children: 3  . Years of education: Not on file  . Highest education level: Not on file  Occupational History  . Not on file  Social Needs  . Financial resource strain: Not on file  . Food insecurity:    Worry: Not on file    Inability: Not on file  . Transportation needs:    Medical: Not on file    Non-medical: Not on file  Tobacco Use  . Smoking status: Former Smoker    Packs/day: 1.00    Years: 25.00    Pack years: 25.00    Types: Cigarettes    Last attempt to quit: 11/19/2017    Years since quitting: 0.2  . Smokeless tobacco: Never Used  Substance and Sexual Activity  . Alcohol use: No  . Drug use: No  . Sexual activity: Not on file  Lifestyle  . Physical activity:    Days per week: Not on file    Minutes per session: Not on file  . Stress: Not on file  Relationships  . Social connections:    Talks on phone: Not on file    Gets together: Not on file    Attends religious service: Not on file    Active member of club or organization: Not on file    Attends meetings of clubs or organizations: Not on file    Relationship status: Not on file  . Intimate partner violence:    Fear of current or ex partner: Not on file    Emotionally abused: Not on file    Physically abused: Not on file    Forced sexual activity: Not on file  Other Topics Concern  . Not on file  Social History Narrative  . Not on file    FAMILY HISTORY: Family History  Problem Relation Age of Onset  . Colon cancer Mother 29  . Colon cancer Maternal Grandmother 78  . Hypertension Father   . Breast cancer Sister 54  . Throat cancer Brother   . Heart attack Maternal  Aunt   . Ovarian cancer Maternal Aunt   . Stomach cancer Paternal Grandmother   . Throat cancer Brother     ALLERGIES:  has No Known Allergies.  MEDICATIONS:  Current Outpatient Medications  Medication Sig Dispense Refill  . albuterol (PROVENTIL HFA;VENTOLIN HFA) 108 (90 Base) MCG/ACT inhaler Inhale 2 puffs into the lungs every 6 (six) hours as needed for wheezing or shortness of breath. 1 Inhaler 0  . aspirin EC 81 MG tablet Take 81 mg by mouth daily.    Marland Kitchen atorvastatin (LIPITOR) 10 MG tablet TAKE 1 TABLET BY MOUTH ONCE DAILY IN THE MORNING 30 tablet 1  . busPIRone (BUSPAR) 10 MG tablet Take 1 tablet (10 mg total) by mouth 2 (two) times daily. 60 tablet 1  . clopidogrel (PLAVIX) 75 MG tablet Take 1 tablet (75 mg total) by mouth daily. 30 tablet 1  . FLUoxetine (PROZAC)  20 MG tablet Take 1 tablet (20 mg total) by mouth daily. 30 tablet 3  . levothyroxine (SYNTHROID, LEVOTHROID) 75 MCG tablet Take 1 tablet (75 mcg total) by mouth daily. 30 tablet 1  . lisinopril-hydrochlorothiazide (PRINZIDE,ZESTORETIC) 10-12.5 MG tablet Take 1 tablet by mouth daily. 90 tablet 1  . Multiple Vitamin (MULTI-VITAMINS) TABS Take by mouth.    . ALPRAZolam (XANAX) 0.25 MG tablet Take 1 tablet (0.25 mg total) by mouth 2 (two) times daily as needed for anxiety. 30 tablet 0   No current facility-administered medications for this visit.      PHYSICAL EXAMINATION: ECOG PERFORMANCE STATUS: 1 - Symptomatic but completely ambulatory Vitals:   02/11/18 0857  BP: (!) 163/96  Pulse: 75  Temp: (!) 96.4 F (35.8 C)  SpO2: 99%   Filed Weights   02/11/18 0857  Weight: 182 lb 15.7 oz (83 kg)    Physical Exam  Constitutional: She is oriented to person, place, and time. No distress.  HENT:  Head: Normocephalic and atraumatic.  Mouth/Throat: Oropharynx is clear and moist.  Eyes: Pupils are equal, round, and reactive to light. EOM are normal. No scleral icterus.  Neck: Normal range of motion. Neck supple.    Cardiovascular: Normal rate, regular rhythm and normal heart sounds.  Pulmonary/Chest: Effort normal. No respiratory distress. She has no wheezes.  Decreased breath sound bilaterally.  Abdominal: Soft. Bowel sounds are normal. She exhibits no distension and no mass. There is no tenderness.  Musculoskeletal: Normal range of motion. She exhibits no edema or deformity.  Neurological: She is alert and oriented to person, place, and time. No cranial nerve deficit. Coordination normal.  Skin: Skin is warm and dry. No rash noted. No erythema.  Psychiatric: She has a normal mood and affect. Her behavior is normal. Thought content normal.  Anxious     LABORATORY DATA:  I have reviewed the data as listed No results found for: WBC, HGB, HCT, MCV, PLT Recent Labs    01/22/18 1353  NA 140  K 4.1  CL 99  CO2 23  GLUCOSE 100*  BUN 11  CREATININE 0.82  CALCIUM 9.6  GFRNONAA 79  GFRAA 91  PROT 7.0  ALBUMIN 3.9  AST 13  ALT 13  ALKPHOS 106  BILITOT 0.3  BILIDIR 0.09   Iron/TIBC/Ferritin/ %Sat No results found for: IRON, TIBC, FERRITIN, IRONPCTSAT   RADIOGRAPHIC STUDIES: I have personally reviewed the radiological images as listed and agreed with the findings in the report. 02/09/2018 PET scan  1. Unusual pattern of nodal metastasis with intensely hypermetabolic enlarged precarinal lymph node, RIGHT hilar lymph node and RIGHT axillary lymph node. Recommend biopsy of the RIGHT axial lymph node. 2. No metabolic activity associated with the RIGHT lobe pulmonary nodule. 3. No additional evidence primary or metastatic carcinoma  ASSESSMENT & PLAN:  1. Lung nodule   2. Mediastinal lymphadenopathy   3. Lymphadenopathy, axillary    PET scan was independently reviewed by me and discussed with patient.  She has an unusual pattern of of nodal metastasis. Left  Lobe 31mm lung nodule has no metabolic activity.  Plan brain MRI after PET scan if confirmed mediastinal adenopathy hyper metabolic  activity.  Hypermetabolic precarinal LN and right hilar LN.  Large hypermetabolic right axillary LN.  Suspecting breast cancer.   left lobe nodule can not rule out malignancy due to the small size which may not show activity on PET.   # Diagnostic mammogram with [place] axillary were obtained.Image was independently reviewed by  me. No suspicious nodule on mammogram.  There is right breast nodule on PET scan , no hypermetabolic activity.   Discussed with patient that I recommend [place] right axillary LN biopsy to establish tissue diagnosis.  May need additional image [MRI].   Anxiety/depression/insomnia, exacerbated with abnormal CT/PET/mammogram finding with possible diagnosis of cancer.  I prescribed her low dose Xanax 0.25mg  as needed. Side effects discussed. Advise avoiding driving after taking Xanax.    Follow-up appointment to be determined based on image findings and discussion on tumor board.  All questions were answered. The patient knows to call the clinic with any problems questions or concerns.  Return of visit:  To be determined. I will see patient after biopsy to discuss pathology results.  Total face to face encounter time for this patient visit was 25 min. >50% of the time was  spent in counseling and coordination of care.    Earlie Server, MD, PhD Hematology Oncology [facility] at [facility] Pager- [phone] 02/11/2018

## 2018-02-11 NOTE — Progress Notes (Signed)
Patient here for follow up. No concerns voiced.  °

## 2018-02-15 ENCOUNTER — Other Ambulatory Visit: Payer: Self-pay | Admitting: Oncology

## 2018-02-15 ENCOUNTER — Ambulatory Visit
Admission: RE | Admit: 2018-02-15 | Discharge: 2018-02-15 | Disposition: A | Discharge status: Home / Self Care | Payer: BLUE CROSS/BLUE SHIELD | Source: Ambulatory Visit | Attending: Oncology | Admitting: Oncology

## 2018-02-15 DIAGNOSIS — R928 Other abnormal and inconclusive findings on diagnostic imaging of breast: Secondary | ICD-10-CM | POA: Diagnosis present

## 2018-02-16 ENCOUNTER — Other Ambulatory Visit: Payer: Self-pay | Admitting: Oncology

## 2018-02-16 DIAGNOSIS — R928 Other abnormal and inconclusive findings on diagnostic imaging of breast: Secondary | ICD-10-CM

## 2018-02-17 ENCOUNTER — Other Ambulatory Visit: Payer: BLUE CROSS/BLUE SHIELD

## 2018-02-18 ENCOUNTER — Ambulatory Visit: Payer: BLUE CROSS/BLUE SHIELD | Admitting: Oncology

## 2018-02-24 ENCOUNTER — Ambulatory Visit
Admission: RE | Admit: 2018-02-24 | Discharge: 2018-02-24 | Disposition: A | Discharge status: Home / Self Care | Payer: BLUE CROSS/BLUE SHIELD | Source: Ambulatory Visit | Attending: Oncology | Admitting: Oncology

## 2018-02-24 ENCOUNTER — Other Ambulatory Visit: Payer: Self-pay | Admitting: Oncology

## 2018-02-24 DIAGNOSIS — R928 Other abnormal and inconclusive findings on diagnostic imaging of breast: Secondary | ICD-10-CM

## 2018-02-26 ENCOUNTER — Other Ambulatory Visit: Payer: Self-pay | Admitting: Oncology

## 2018-02-26 ENCOUNTER — Other Ambulatory Visit: Payer: Self-pay | Admitting: Family Medicine

## 2018-02-26 DIAGNOSIS — I10 Essential (primary) hypertension: Secondary | ICD-10-CM

## 2018-02-26 LAB — SURGICAL PATHOLOGY

## 2018-03-01 ENCOUNTER — Other Ambulatory Visit: Payer: Self-pay

## 2018-03-01 ENCOUNTER — Inpatient Hospital Stay: Payer: BLUE CROSS/BLUE SHIELD | Attending: Oncology | Admitting: Oncology

## 2018-03-01 ENCOUNTER — Telehealth (INDEPENDENT_AMBULATORY_CARE_PROVIDER_SITE_OTHER): Payer: Self-pay

## 2018-03-01 ENCOUNTER — Encounter: Payer: Self-pay | Admitting: Oncology

## 2018-03-01 VITALS — BP 164/94 | HR 92 | Temp 97.1°F | Resp 16 | Wt 183.4 lb

## 2018-03-01 DIAGNOSIS — F419 Anxiety disorder, unspecified: Secondary | ICD-10-CM | POA: Diagnosis not present

## 2018-03-01 DIAGNOSIS — Z801 Family history of malignant neoplasm of trachea, bronchus and lung: Secondary | ICD-10-CM | POA: Diagnosis not present

## 2018-03-01 DIAGNOSIS — R590 Localized enlarged lymph nodes: Secondary | ICD-10-CM

## 2018-03-01 DIAGNOSIS — Z800 Family history of malignant neoplasm of digestive organs: Secondary | ICD-10-CM

## 2018-03-01 DIAGNOSIS — Z803 Family history of malignant neoplasm of breast: Secondary | ICD-10-CM

## 2018-03-01 DIAGNOSIS — Z7189 Other specified counseling: Secondary | ICD-10-CM

## 2018-03-01 DIAGNOSIS — J449 Chronic obstructive pulmonary disease, unspecified: Secondary | ICD-10-CM | POA: Diagnosis not present

## 2018-03-01 DIAGNOSIS — Z87891 Personal history of nicotine dependence: Secondary | ICD-10-CM

## 2018-03-01 DIAGNOSIS — F329 Major depressive disorder, single episode, unspecified: Secondary | ICD-10-CM | POA: Diagnosis not present

## 2018-03-01 DIAGNOSIS — Z8541 Personal history of malignant neoplasm of cervix uteri: Secondary | ICD-10-CM

## 2018-03-01 DIAGNOSIS — Z808 Family history of malignant neoplasm of other organs or systems: Secondary | ICD-10-CM

## 2018-03-01 DIAGNOSIS — C773 Secondary and unspecified malignant neoplasm of axilla and upper limb lymph nodes: Secondary | ICD-10-CM

## 2018-03-01 DIAGNOSIS — C3490 Malignant neoplasm of unspecified part of unspecified bronchus or lung: Secondary | ICD-10-CM

## 2018-03-01 DIAGNOSIS — Z8041 Family history of malignant neoplasm of ovary: Secondary | ICD-10-CM | POA: Diagnosis not present

## 2018-03-01 HISTORY — DX: Malignant neoplasm of unspecified part of unspecified bronchus or lung: C34.90

## 2018-03-01 MED ORDER — PROCHLORPERAZINE MALEATE 10 MG PO TABS: 10.0000 mg | ORAL_TABLET | Freq: Four times a day (QID) | ORAL | 1 refills | Status: DC | PRN

## 2018-03-01 MED ORDER — ALPRAZOLAM 0.25 MG PO TABS: 0.2500 mg | ORAL_TABLET | Freq: Two times a day (BID) | ORAL | 0 refills | Status: DC | PRN

## 2018-03-01 MED ORDER — FOLIC ACID 1 MG PO TABS: 1.0000 mg | ORAL_TABLET | Freq: Every day | ORAL | 3 refills | Status: DC

## 2018-03-01 MED ORDER — ONDANSETRON HCL 8 MG PO TABS: 8.0000 mg | ORAL_TABLET | Freq: Two times a day (BID) | ORAL | 1 refills | Status: DC | PRN

## 2018-03-01 MED ORDER — LIDOCAINE-PRILOCAINE 2.5-2.5 % EX CREA: TOPICAL_CREAM | CUTANEOUS | 3 refills | Status: DC

## 2018-03-01 NOTE — Progress Notes (Signed)
START ON PATHWAY REGIMEN - Non-Small Cell Lung     A cycle is every 21 days:     Pembrolizumab      Pemetrexed      Carboplatin   **Always confirm dose/schedule in your pharmacy ordering system**  Patient Characteristics: Stage IV Metastatic, Nonsquamous, Initial Chemotherapy/Immunotherapy, PS = 0, 1, ALK Translocation Negative/Unknown and EGFR Mutation Negative/Non-Sensitizing/Unknown, PD-L1 Expression Positive 1-49% (TPS) / Negative / Not Tested / Awaiting Test Results  and Immunotherapy Candidate AJCC T Category: T1 Current Disease Status: Distant Metastases AJCC N Category: N3 AJCC M Category: M1b AJCC 8 Stage Grouping: IVA Histology: Nonsquamous Cell ROS1 Rearrangement Status: Awaiting Test Results T790M Mutation Status: Not Applicable - EGFR Mutation Negative/Unknown Other Mutations/Biomarkers: No Other Actionable Mutations NTRK Gene Fusion Status: Awaiting Test Results PD-L1 Expression Status: Awaiting Test Results Chemotherapy/Immunotherapy LOT: Initial Chemotherapy/Immunotherapy Molecular Targeted Therapy: Not Appropriate ALK Translocation Status: Awaiting Test Results EGFR Mutation Status: Awaiting Test Results BRAF V600E Mutation Status: Awaiting Test Results Performance Status: PS = 0, 1 Immunotherapy Candidate Status: Candidate for Immunotherapy Intent of Therapy: Non-Curative / Palliative Intent, Discussed with Patient

## 2018-03-01 NOTE — Progress Notes (Signed)
Patient here today for follow up and biopsy results.

## 2018-03-01 NOTE — Progress Notes (Signed)
Hematology/Oncology follow up note [facility] Telephone:([phone] Fax:([phone]) [phone]   Patient Care Team: Juline Patch, MD as PCP - General (Family Medicine) Telford Nab, RN as Registered Nurse  REFERRING PROVIDER: Juline Patch, MD REASON FOR VISIT:  Follow-up on lung cancer management.  HISTORY OF PRESENTING ILLNESS:  Kristina Wright is a  62 y.o.  female with PMH listed below who was referred to me for evaluation of lung nodule Patient recently saw chronic care physician for evaluation of shortness of breath, intermittent.  Symptoms are aggravated by emotional upset. Also chest pain, 4 out of 10, substernal region.  No radiation.  As work-up, patient had chest x-ray work-up which showed scattered nodular appearing densities in the bases.  Noncontrast CT is recommended for evaluation. Outside CT 01/19/2018 chest CT without contrast showed slightly irregular pulmonary nodule within the anterior left lower lobe, measuring 8.7 x 5.8 mm and is suspicious requiring follow-up or further evaluation.  There are no other concerning pulmonary nodules.  There is a focal pleural nodule at the right middle lobe minor fissure measures 3.1 mm thickness and 9.2 mm in length.  This is most likely a benign linear scar or lymphoid tissue of the pleura.  Mediastinal adenopathy with a precarinal lymph node mass measuring 1.7 x 3.7 cm.  There is also a right hilar lymph node measuring approximately 1.7 cm.  Difficult to measure without contrast. Patient was referred to cancer center for further evaluation. She is accompanied by her sister to the clinic today. Denies any hemoptysis, shortness of breath more than baseline, wheezing, coughing, chest pain, abdominal pain, headache, double vision. She appears really nervous and anxious about the abnormal findings on CT scan.  She has a history of depression and anxiety.  Takes BuSpar, Prozac.   She has a history of 30-year pack smoking  history, quit 6 months ago.  Weight loss about 4 pounds in the past 3 months. Significant family history of cancer.  Her sister follows up with me for breast cancer.  Other family member cancer history listed in the family history section.  Self-report history of cervical cancer in 1989.  INTERVAL HISTORY Kristina Wright is a 62 y.o. female who has above history reviewed by me today presents for follow up visit for newly diagnosed lung cancer, discussion of pathology results and further management plan.  During the interval, patient has had biopsy of right axillary lymph node. Pathology showed adenocarcinoma with lung origin. Patient presents to discuss results.  Accompanied by a friend whose husband is a Engineer, drilling. #Anxiety patient reports feeling extremely anxious not able to sleep well.  She is tearful. She has been on Prozac, BuSpar for depression.  She is also on Xanax which she reports works very well taking her off the edge and requests a refill.  Denies any suicidal ideation  #Chronic shortness of breath at baseline.  Review of Systems  Constitutional: Positive for malaise/fatigue. Negative for chills, fever and weight loss.  HENT: Negative for nosebleeds and sore throat.   Eyes: Negative for double vision, photophobia and redness.  Respiratory: Positive for shortness of breath. Negative for cough and wheezing.   Cardiovascular: Negative for chest pain, palpitations and orthopnea.  Gastrointestinal: Negative for abdominal pain, blood in stool, nausea and vomiting.  Genitourinary: Negative for dysuria.  Musculoskeletal: Negative for back pain, myalgias and neck pain.  Skin: Negative for itching and rash.  Neurological: Negative for dizziness, tingling and tremors.  Endo/Heme/Allergies: Negative for environmental  allergies. Does not bruise/bleed easily.  Psychiatric/Behavioral: Negative for depression. The patient is nervous/anxious.     MEDICAL HISTORY:  Past Medical History:    Diagnosis Date  . Cancer ([place]) 1989   cervical  . Depression   . Hyperlipidemia   . Hypertension   . Malignant neoplasm of lung ([place]) 03/01/2018  . Thyroid disease     SURGICAL HISTORY: Past Surgical History:  Procedure Laterality Date  . BILATERAL CARPAL TUNNEL RELEASE    . BREAST CYST ASPIRATION Left   . PARTIAL HYSTERECTOMY    . TUBAL LIGATION      SOCIAL HISTORY: Social History   Socioeconomic History  . Marital status: Divorced    Spouse name: Not on file  . Number of children: 3  . Years of education: Not on file  . Highest education level: Not on file  Occupational History  . Not on file  Social Needs  . Financial resource strain: Not on file  . Food insecurity:    Worry: Not on file    Inability: Not on file  . Transportation needs:    Medical: Not on file    Non-medical: Not on file  Tobacco Use  . Smoking status: Former Smoker    Packs/day: 1.00    Years: 25.00    Pack years: 25.00    Types: Cigarettes    Last attempt to quit: 11/19/2017    Years since quitting: 0.2  . Smokeless tobacco: Never Used  Substance and Sexual Activity  . Alcohol use: No  . Drug use: No  . Sexual activity: Not on file  Lifestyle  . Physical activity:    Days per week: Not on file    Minutes per session: Not on file  . Stress: Not on file  Relationships  . Social connections:    Talks on phone: Not on file    Gets together: Not on file    Attends religious service: Not on file    Active member of club or organization: Not on file    Attends meetings of clubs or organizations: Not on file    Relationship status: Not on file  . Intimate partner violence:    Fear of current or ex partner: Not on file    Emotionally abused: Not on file    Physically abused: Not on file    Forced sexual activity: Not on file  Other Topics Concern  . Not on file  Social History Narrative  . Not on file    FAMILY HISTORY: Family History  Problem Relation Age of Onset  . Colon  cancer Mother 28  . Colon cancer Maternal Grandmother 78  . Hypertension Father   . Breast cancer Sister 9  . Throat cancer Brother   . Heart attack Maternal Aunt   . Ovarian cancer Maternal Aunt   . Stomach cancer Paternal Grandmother   . Throat cancer Brother     ALLERGIES:  has No Known Allergies.  MEDICATIONS:  Current Outpatient Medications  Medication Sig Dispense Refill  . albuterol (PROVENTIL HFA;VENTOLIN HFA) 108 (90 Base) MCG/ACT inhaler Inhale 2 puffs into the lungs every 6 (six) hours as needed for wheezing or shortness of breath. 1 Inhaler 0  . ALPRAZolam (XANAX) 0.25 MG tablet Take 1 tablet (0.25 mg total) by mouth 2 (two) times daily as needed for anxiety. 30 tablet 0  . aspirin EC 81 MG tablet Take 81 mg by mouth daily.    Marland Kitchen atorvastatin (LIPITOR) 10 MG tablet  TAKE 1 TABLET BY MOUTH ONCE DAILY IN THE MORNING (Patient taking differently: Take 10 mg by mouth daily. ) 30 tablet 1  . clopidogrel (PLAVIX) 75 MG tablet Take 1 tablet (75 mg total) by mouth daily. 30 tablet 1  . levothyroxine (SYNTHROID, LEVOTHROID) 75 MCG tablet Take 1 tablet (75 mcg total) by mouth daily. 30 tablet 1  . lisinopril-hydrochlorothiazide (PRINZIDE,ZESTORETIC) 10-12.5 MG tablet Take 1 tablet by mouth daily. 90 tablet 1  . busPIRone (BUSPAR) 5 MG tablet Take 5 mg by mouth daily.    Marland Kitchen FLUoxetine (PROZAC) 10 MG capsule Take 10 mg by mouth daily.    . folic acid (FOLVITE) 1 MG tablet Take 1 tablet (1 mg total) by mouth daily. Start 7-14 days before Alimta chemotherapy. 100 tablet 3  . lidocaine-prilocaine (EMLA) cream Apply to affected area once 30 g 3  . ondansetron (ZOFRAN) 8 MG tablet Take 1 tablet (8 mg total) by mouth 2 (two) times daily as needed (Nausea or vomiting). Start if needed on the third day after chemotherapy. 30 tablet 1  . prochlorperazine (COMPAZINE) 10 MG tablet Take 1 tablet (10 mg total) by mouth every 6 (six) hours as needed (Nausea or vomiting). 30 tablet 1   No current  facility-administered medications for this visit.      PHYSICAL EXAMINATION: ECOG PERFORMANCE STATUS: 1 - Symptomatic but completely ambulatory Vitals:   03/01/18 1021  BP: (!) 164/94  Pulse: 92  Resp: 16  Temp: (!) 97.1 F (36.2 C)   Filed Weights   03/01/18 1021  Weight: 183 lb 6 oz (83.2 kg)    Physical Exam  Constitutional: She is oriented to person, place, and time. No distress.  HENT:  Head: Normocephalic and atraumatic.  Mouth/Throat: Oropharynx is clear and moist.  Eyes: Pupils are equal, round, and reactive to light. EOM are normal. No scleral icterus.  Neck: Normal range of motion. Neck supple.  Cardiovascular: Normal rate, regular rhythm and normal heart sounds.  Pulmonary/Chest: Effort normal. No respiratory distress. She has no wheezes.  Decreased breath sound bilaterally.  Abdominal: Soft. Bowel sounds are normal. She exhibits no distension and no mass. There is no tenderness.  Musculoskeletal: Normal range of motion. She exhibits no edema or deformity.  Neurological: She is alert and oriented to person, place, and time. No cranial nerve deficit. Coordination normal.  Skin: Skin is warm and dry. No rash noted. No erythema.  Psychiatric: She has a normal mood and affect. Her behavior is normal. Thought content normal.  Anxious, tearful     LABORATORY DATA:  I have reviewed the data as listed No results found for: WBC, HGB, HCT, MCV, PLT Recent Labs    01/22/18 1353  NA 140  K 4.1  CL 99  CO2 23  GLUCOSE 100*  BUN 11  CREATININE 0.82  CALCIUM 9.6  GFRNONAA 79  GFRAA 91  PROT 7.0  ALBUMIN 3.9  AST 13  ALT 13  ALKPHOS 106  BILITOT 0.3  BILIDIR 0.09   Iron/TIBC/Ferritin/ %Sat No results found for: IRON, TIBC, FERRITIN, IRONPCTSAT   RADIOGRAPHIC STUDIES: I have personally reviewed the radiological images as listed and agreed with the findings in the report. 02/09/2018 PET scan  1. Unusual pattern of nodal metastasis with intensely  hypermetabolic enlarged precarinal lymph node, RIGHT hilar lymph node and RIGHT axillary lymph node. Recommend biopsy of the RIGHT axial lymph node. 2. No metabolic activity associated with the RIGHT lobe pulmonary nodule. 3. No additional evidence primary or  metastatic carcinoma  Diagnostic mammogram with [place] axillary were obtained.Image was independently reviewed by me. No suspicious nodule on mammogram.  There is right breast nodule on PET scan , no hypermetabolic activity.   ASSESSMENT & PLAN:  1. Malignant neoplasm of lung, unspecified laterality, unspecified part of lung ([place])   2. Goals of care, counseling/discussion   3. Mediastinal lymphadenopathy    #I independently reviewed patient's images PET scan and CT scans and pathology findings.  I have also discussed with pathologist Dr. Ronnald Ramp her pathology findings. Patient has stage IV lung adenocarcinoma, cT1 N3 M1  I recommend to obtain MRI brain to complete staging. Sent NGS with Omn[employer]q for mutation and PDL 1 analysis. Presuming she does not carry actionable mutation, patient will need systemic immunotherapy plus minus chemotherapy depending on PDL 1 status. Goal of care discussion The diagnosis and care plan were discussed with patient in detail.  NCCN guidelines were reviewed and shared with patient. Patient understands that her condition is not curable. The goal of treatment which is to palliate disease, disease related symptoms, improve quality of life and hopefully prolong life was highlighted in our discussion.  Chemotherapy and immunotherapy education was provided.  I explained to the patient the risks and benefits of chemotherapy of carboplatin and Alimta  including all but not limited to hair loss, mouth sore, nausea, vomiting, low blood counts, bleeding, and risk of life threatening infection and even death, secondary malignancy etc.   We had discussed the composition of chemotherapy regimen, length of chemo cycle, duration  of treatment and the time to assess response to treatment.  Supportive care measures are necessary for patient well-being and will be provided as necessary.   I discussed the mechanism of action and rationale of using immunotherapy.  The goal of therapy is palliative; and length of treatments are likely ongoing/based upon the results of the scans. Discussed the potential side effects of immunotherapy including but not limited to diarrhea; skin rash; respiratory failure, neurotoxicity, elevated LFTs/endocrine abnormalities etc. Patient voices understanding and willing to proceed chemotherapy/immunotherapy  # Chemotherapy education; refer to Dr.Dew for port placement. Hopefully the planned start chemotherapy next week. Antiemetics-Zofran and Compazine; EMLA cream sent to pharmacy. Start Folic acid 1mg  daily. Will give her B12 injection next week.   #Anxiety/depression, continue Prozac and BuSpar.  I refilled another 30 tablets of Xanax every 12 hours as needed. We had a discussion that Xanax is not a good long-term option for patient's anxiety.  I suggest patient to follow-up with her primary care physician and have her antidepressant dose adjusted.  She voices understanding.  Patient verbalized understanding that medications should not be sold or shared, taken with alcohol, or used while driving. Patient educated that medications should not be bitten, chewed, or crushed. patient has been made aware of the sside effects and treatment/ prevention  of constipation,  respiratory depression,  and mental status changes, even lead to overdose that may result in death, if used outside of the parameters that we discussed.  Patient remains tearful and was overwhelmed by the cancer diagnosis. We will have her follow-up next week for further discussion.  We spent sufficient time to discuss many aspect of care, questions were answered to patient's satisfaction. The patient knows to call the clinic with any problems  questions or concerns.  Return of visit: 1 week Total face to face encounter time for this patient visit was 70 min. >50% of the time was  spent in counseling and coordination of  care.    Earlie Server, MD, PhD Hematology Oncology [facility] Pager- [phone] 03/01/2018

## 2018-03-01 NOTE — Telephone Encounter (Signed)
I attempted to contact the patient to schedule her port placement. I left a message for a return call.

## 2018-03-01 NOTE — Telephone Encounter (Signed)
Patient scheduled for 03/03/18 with Dr. Delana Meyer and instructions were discussed with the patient.

## 2018-03-02 ENCOUNTER — Other Ambulatory Visit (INDEPENDENT_AMBULATORY_CARE_PROVIDER_SITE_OTHER): Payer: Self-pay | Admitting: Nurse Practitioner

## 2018-03-02 MED ORDER — DEXTROSE 5 % IV SOLN
2.0000 g | Freq: Once | INTRAVENOUS | Status: DC
  Filled 2018-03-02: qty 20

## 2018-03-03 ENCOUNTER — Ambulatory Visit
Admission: RE | Admit: 2018-03-03 | Discharge: 2018-03-03 | Disposition: A | Discharge status: Home / Self Care | Payer: BLUE CROSS/BLUE SHIELD | Source: Ambulatory Visit | Attending: Vascular Surgery | Admitting: Vascular Surgery

## 2018-03-03 ENCOUNTER — Encounter
Admission: RE | Disposition: A | Discharge status: Home / Self Care | Payer: Self-pay | Source: Ambulatory Visit | Attending: Vascular Surgery

## 2018-03-03 ENCOUNTER — Encounter: Payer: Self-pay | Admitting: *Deleted

## 2018-03-03 ENCOUNTER — Other Ambulatory Visit: Payer: Self-pay

## 2018-03-03 DIAGNOSIS — C3490 Malignant neoplasm of unspecified part of unspecified bronchus or lung: Secondary | ICD-10-CM

## 2018-03-03 HISTORY — PX: PORTA CATH INSERTION: CATH118285

## 2018-03-03 SURGERY — PORTA CATH INSERTION
Anesthesia: Moderate Sedation

## 2018-03-03 MED ORDER — HEPARIN (PORCINE) IN NACL 1000-0.9 UT/500ML-% IV SOLN
INTRAVENOUS | Status: AC
  Filled 2018-03-03: qty 500

## 2018-03-03 MED ORDER — FENTANYL CITRATE (PF) 100 MCG/2ML IJ SOLN
INTRAMUSCULAR | Status: DC | PRN
  Administered 2018-03-03: 25 ug via INTRAVENOUS
  Administered 2018-03-03: 50 ug via INTRAVENOUS
  Administered 2018-03-03: 25 ug via INTRAVENOUS

## 2018-03-03 MED ORDER — FENTANYL CITRATE (PF) 100 MCG/2ML IJ SOLN
INTRAMUSCULAR | Status: AC
  Filled 2018-03-03: qty 2

## 2018-03-03 MED ORDER — LIDOCAINE-EPINEPHRINE (PF) 1 %-1:200000 IJ SOLN
INTRAMUSCULAR | Status: AC
  Filled 2018-03-03: qty 30

## 2018-03-03 MED ORDER — HYDROMORPHONE HCL 1 MG/ML IJ SOLN: 1.0000 mg | Freq: Once | INTRAMUSCULAR | Status: DC | PRN

## 2018-03-03 MED ORDER — MIDAZOLAM HCL 2 MG/2ML IJ SOLN
INTRAMUSCULAR | Status: DC | PRN
  Administered 2018-03-03 (×2): 1 mg via INTRAVENOUS
  Administered 2018-03-03: 2 mg via INTRAVENOUS

## 2018-03-03 MED ORDER — SODIUM CHLORIDE 0.9 % IV SOLN: INTRAVENOUS | Status: DC

## 2018-03-03 MED ORDER — MIDAZOLAM HCL 5 MG/5ML IJ SOLN
INTRAMUSCULAR | Status: AC
  Filled 2018-03-03: qty 5

## 2018-03-03 MED ORDER — ONDANSETRON HCL 4 MG/2ML IJ SOLN: 4.0000 mg | Freq: Four times a day (QID) | INTRAMUSCULAR | Status: DC | PRN

## 2018-03-03 MED ORDER — CEFAZOLIN SODIUM-DEXTROSE 2-4 GM/100ML-% IV SOLN
INTRAVENOUS | Status: AC
  Administered 2018-03-03: 14:00:00
  Filled 2018-03-03: qty 100

## 2018-03-03 SURGICAL SUPPLY — 9 items
DERMABOND ADVANCED (GAUZE/BANDAGES/DRESSINGS) ×1
DERMABOND ADVANCED .7 DNX12 (GAUZE/BANDAGES/DRESSINGS) ×1 IMPLANT
DRAPE INCISE IOBAN 66X45 STRL (DRAPES) ×2 IMPLANT
KIT PORT POWER 8FR ISP CVUE (Port) ×2 IMPLANT
NEEDLE ENTRY 21GA 7CM ECHOTIP (NEEDLE) ×2 IMPLANT
PACK ANGIOGRAPHY (CUSTOM PROCEDURE TRAY) ×2 IMPLANT
SET INTRO CAPELLA COAXIAL (SET/KITS/TRAYS/PACK) ×2 IMPLANT
SUT MNCRL AB 4-0 PS2 18 (SUTURE) ×2 IMPLANT
SUT VICRYL+ 3-0 36IN CT-1 (SUTURE) ×2 IMPLANT

## 2018-03-03 NOTE — H&P (Signed)
[facility] History & Physical Update  The patient was interviewed and re-examined.  The patient's previous History and Physical has been reviewed and is unchanged.    Patient with non-small cell lung carcinoma.  She requires chemotherapy.  Appropriate intravenous access is therefore needed.  To accomplish this she is undergoing placement of a port.  There is no change in the plan of care. We plan to proceed with the scheduled procedure of Infuse-a-Port placement.  Hortencia Pilar, MD  03/03/2018, 1:55 PM

## 2018-03-03 NOTE — Op Note (Signed)
OPERATIVE NOTE   PROCEDURE: 1. Placement of a right IJ Infuse-a-Port  PRE-OPERATIVE DIAGNOSIS: Non-small cell lung carcinoma  POST-OPERATIVE DIAGNOSIS: Same  SURGEON: Katha Cabal M.D.  ANESTHESIA: Conscious sedation was administered under my direct supervision by the interventional radiology RN. IV Versed plus fentanyl were utilized. Continuous ECG, pulse oximetry and blood pressure was monitored throughout the entire procedure. Conscious sedation was for a total of 28 minutes.  ESTIMATED BLOOD LOSS: Minimal   FINDING(S): 1.  Patent vein  SPECIMEN(S): None  INDICATIONS:   Kristina Wright is a 62 y.o. female who presents with lung carcinoma.  She will require chemotherapy and therefore appropriate IV access.  Risks and benefits for Infuse-a-Port placement have been explained all questions have been answered patient has agreed to proceed..  DESCRIPTION: After obtaining full informed written consent, the patient was brought back to the special procedure suite and placed in the supine position. The patient's right neck and chest wall are prepped and draped in sterile fashion. Appropriate timeout was called.  Ultrasound is placed in a sterile sleeve, ultrasound is utilized to avoid vascular injury as well as secondary to lack of appropriate landmarks. The right internal jugular vein is identified. It is echolucent and homogeneous as well as easily compressible indicating patency. An image is recorded for the permanent record.  Access to the vein with a micropuncture needle is done under direct ultrasound visualization.  1% lidocaine is infiltrated into the soft tissue at the base of the neck as well as on the chest wall.  Under direct ultrasound visualization a micro-needle is inserted into the vein followed by the micro-wire. Micro-sheath was then advanced and a J wire is inserted without difficulty under fluoroscopic guidance. A small counterincision was created at the wire insertion  site. A transverse incision is created 2 fingerbreadths below the scapula and a pocket is fashioned using both blunt and sharp dissection. The pocket is tested for appropriate size with the hub of the Infuse-a-Port. The tunneling device is then used to pull the intravascular portion of the catheter from the pocket to the neck counterincision.  Dilator and peel-away sheath were then inserted over the wire and the wire is removed. Catheter is then advanced into the venous system without difficulty. Peel-away sheath was then removed.  Catheter is then positioned under fluoroscopic guidance at the atrial caval junction. It is then transected connected to the hub and the hope is slipped into the subcutaneous pocket on the chest wall. The hub was then accessed percutaneously and aspirates easily and flushes well and is flushed with 30 cc of heparinized saline. The pocket incision is then closed in layers using interrupted 3-0 Vicryl for the subcutaneous tissues and 4-0 Monocryl subcuticular for skin closure. Dermabond is applied. The neck counterincision was closed with 4-0 Monocryl subcuticular and Dermabond as well.  The patient tolerated the procedure well and there were no immediate complications.  COMPLICATIONS: None  CONDITION: Unchanged  Katha Cabal M.D.  [facility]in and vascular Office: [phone]   03/03/2018, 3:33 PM

## 2018-03-04 ENCOUNTER — Encounter: Payer: Self-pay | Admitting: Vascular Surgery

## 2018-03-04 ENCOUNTER — Other Ambulatory Visit: Payer: BLUE CROSS/BLUE SHIELD

## 2018-03-04 NOTE — Progress Notes (Addendum)
Tumor Board Documentation  YOCELIN VANLUE was presented by Dr Tasia Catchings at our Tumor Board on 03/04/2018, which included representatives from medical oncology, pathology, navigation, internal medicine, research, radiology, radiation oncology, pulmonology, palliative care, surgical, pharmacy.  Adrea currently presents as a current patient with history of the following treatments: none.  Additionally, we reviewed previous medical and familial history, history of present illness, and recent lab results along with all available histopathologic and imaging studies. The tumor board considered available treatment options and made the following recommendations: Systemic chemotherapy.     The following procedures/referrals were also placed: No orders of the defined types were placed in this encounter.   Clinical Trial Status: not discussed   Staging used: AJCC Stage Group  National site-specific guidelines NCCN were discussed with respect to the case.  Tumor board is a meeting of clinicians from various specialty areas who evaluate and discuss patients for whom a multidisciplinary approach is being considered. Final determinations in the plan of care are those of the provider(s). The responsibility for follow up of recommendations given during tumor board is that of the provider.   Today's extended care, comprehensive team conference, Aasiya was not present for the discussion and was not examined.   Multidisciplinary Tumor Board is a multidisciplinary case peer review process.  Decisions discussed in the Multidisciplinary Tumor Board reflect the opinions of the specialists present at the conference without having examined the patient.  Ultimately, treatment and diagnostic decisions rest with the primary provider(s) and the patient.

## 2018-03-05 NOTE — Patient Instructions (Signed)
Pembrolizumab injection What is this medicine? PEMBROLIZUMAB (pem broe liz ue mab) is a monoclonal antibody. It is used to treat melanoma, head and neck cancer, Hodgkin lymphoma, non-small cell lung cancer, urothelial cancer, stomach cancer, and cancers that have a certain genetic condition. This medicine may be used for other purposes; ask your health care provider or pharmacist if you have questions. COMMON BRAND NAME(S): Keytruda What should I tell my health care provider before I take this medicine? They need to know if you have any of these conditions: -diabetes -immune system problems -inflammatory bowel disease -liver disease -lung or breathing disease -lupus -organ transplant -an unusual or allergic reaction to pembrolizumab, other medicines, foods, dyes, or preservatives -pregnant or trying to get pregnant -breast-feeding How should I use this medicine? This medicine is for infusion into a vein. It is given by a health care professional in a hospital or clinic setting. A special MedGuide will be given to you before each treatment. Be sure to read this information carefully each time. Talk to your pediatrician regarding the use of this medicine in children. While this drug may be prescribed for selected conditions, precautions do apply. Overdosage: If you think you have taken too much of this medicine contact a poison control center or emergency room at once. NOTE: This medicine is only for you. Do not share this medicine with others. What if I miss a dose? It is important not to miss your dose. Call your doctor or health care professional if you are unable to keep an appointment. What may interact with this medicine? Interactions have not been studied. Give your health care provider a list of all the medicines, herbs, non-prescription drugs, or dietary supplements you use. Also tell them if you smoke, drink alcohol, or use illegal drugs. Some items may interact with your  medicine. This list may not describe all possible interactions. Give your health care provider a list of all the medicines, herbs, non-prescription drugs, or dietary supplements you use. Also tell them if you smoke, drink alcohol, or use illegal drugs. Some items may interact with your medicine. What should I watch for while using this medicine? Your condition will be monitored carefully while you are receiving this medicine. You may need blood work done while you are taking this medicine. Do not become pregnant while taking this medicine or for 4 months after stopping it. Women should inform their doctor if they wish to become pregnant or think they might be pregnant. There is a potential for serious side effects to an unborn child. Talk to your health care professional or pharmacist for more information. Do not breast-feed an infant while taking this medicine or for 4 months after the last dose. What side effects may I notice from receiving this medicine? Side effects that you should report to your doctor or health care professional as soon as possible: -allergic reactions like skin rash, itching or hives, swelling of the face, lips, or tongue -bloody or black, tarry -breathing problems -changes in vision -chest pain -chills -constipation -cough -dizziness or feeling faint or lightheaded -fast or irregular heartbeat -fever -flushing -hair loss -low blood counts - this medicine may decrease the number of white blood cells, red blood cells and platelets. You may be at increased risk for infections and bleeding. -muscle pain -muscle weakness -persistent headache -signs and symptoms of high blood sugar such as dizziness; dry mouth; dry skin; fruity breath; nausea; stomach pain; increased hunger or thirst; increased urination -signs and symptoms of kidney  injury like trouble passing urine or change in the amount of urine -signs and symptoms of liver injury like dark urine, light-colored  stools, loss of appetite, nausea, right upper belly pain, yellowing of the eyes or skin -stomach pain -sweating -weight loss Side effects that usually do not require medical attention (report to your doctor or health care professional if they continue or are bothersome): -decreased appetite -diarrhea -tiredness This list may not describe all possible side effects. Call your doctor for medical advice about side effects. You may report side effects to FDA at 1-800-FDA-1088. Where should I keep my medicine? This drug is given in a hospital or clinic and will not be stored at home. NOTE: This sheet is a summary. It may not cover all possible information. If you have questions about this medicine, talk to your doctor, pharmacist, or health care provider.  2018 [employer] Standard (2016-01-15 12:29:36) Pemetrexed injection What is this medicine? PEMETREXED (PEM e TREX ed) is a chemotherapy drug used to treat lung cancers like non-small cell lung cancer and mesothelioma. It may also be used to treat other cancers. This medicine may be used for other purposes; ask your health care provider or pharmacist if you have questions. COMMON BRAND NAME(S): Alimta What should I tell my health care provider before I take this medicine? They need to know if you have any of these conditions: -infection (especially a virus infection such as chickenpox, cold sores, or herpes) -kidney disease -low blood counts, like low white cell, platelet, or red cell counts -lung or breathing disease, like asthma -radiation therapy -an unusual or allergic reaction to pemetrexed, other medicines, foods, dyes, or preservative -pregnant or trying to get pregnant -breast-feeding How should I use this medicine? This drug is given as an infusion into a vein. It is administered in a hospital or clinic by a specially trained health care professional. Talk to your pediatrician regarding the use of this medicine in children.  Special care may be needed. Overdosage: If you think you have taken too much of this medicine contact a poison control center or emergency room at once. NOTE: This medicine is only for you. Do not share this medicine with others. What if I miss a dose? It is important not to miss your dose. Call your doctor or health care professional if you are unable to keep an appointment. What may interact with this medicine? This medicine may interact with the following medications: -Ibuprofen This list may not describe all possible interactions. Give your health care provider a list of all the medicines, herbs, non-prescription drugs, or dietary supplements you use. Also tell them if you smoke, drink alcohol, or use illegal drugs. Some items may interact with your medicine. What should I watch for while using this medicine? Visit your doctor for checks on your progress. This drug may make you feel generally unwell. This is not uncommon, as chemotherapy can affect healthy cells as well as cancer cells. Report any side effects. Continue your course of treatment even though you feel ill unless your doctor tells you to stop. In some cases, you may be given additional medicines to help with side effects. Follow all directions for their use. Call your doctor or health care professional for advice if you get a fever, chills or sore throat, or other symptoms of a cold or flu. Do not treat yourself. This drug decreases your body's ability to fight infections. Try to avoid being around people who are sick. This medicine may increase your  risk to bruise or bleed. Call your doctor or health care professional if you notice any unusual bleeding. Be careful brushing and flossing your teeth or using a toothpick because you may get an infection or bleed more easily. If you have any dental work done, tell your dentist you are receiving this medicine. Avoid taking products that contain aspirin, acetaminophen, ibuprofen, naproxen,  or ketoprofen unless instructed by your doctor. These medicines may hide a fever. Call your doctor or health care professional if you get diarrhea or mouth sores. Do not treat yourself. To protect your kidneys, drink water or other fluids as directed while you are taking this medicine. Do not become pregnant while taking this medicine or for 6 months after stopping it. Women should inform their doctor if they wish to become pregnant or think they might be pregnant. Men should not father a child while taking this medicine and for 3 months after stopping it. This may interfere with the ability to father a child. You should talk to your doctor or health care professional if you are concerned about your fertility. There is a potential for serious side effects to an unborn child. Talk to your health care professional or pharmacist for more information. Do not breast-feed an infant while taking this medicine or for 1 week after stopping it. What side effects may I notice from receiving this medicine? Side effects that you should report to your doctor or health care professional as soon as possible: -allergic reactions like skin rash, itching or hives, swelling of the face, lips, or tongue -breathing problems -redness, blistering, peeling or loosening of the skin, including inside the mouth -signs and symptoms of bleeding such as bloody or black, tarry stools; red or dark-brown urine; spitting up blood or brown material that looks like coffee grounds; red spots on the skin; unusual bruising or bleeding from the eye, gums, or nose -signs and symptoms of infection like fever or chills; cough; sore throat; pain or trouble passing urine -signs and symptoms of kidney injury like trouble passing urine or change in the amount of urine -signs and symptoms of liver injury like dark yellow or brown urine; general ill feeling or flu-like symptoms; light-colored stools; loss of appetite; nausea; right upper belly pain;  unusually weak or tired; yellowing of the eyes or skin Side effects that usually do not require medical attention (report to your doctor or health care professional if they continue or are bothersome): -constipation -dizziness -mouth sores -nausea, vomiting -pain, tingling, numbness in the hands or feet -unusually weak or tired This list may not describe all possible side effects. Call your doctor for medical advice about side effects. You may report side effects to FDA at 1-800-FDA-1088. Where should I keep my medicine? This drug is given in a hospital or clinic and will not be stored at home. NOTE: This sheet is a summary. It may not cover all possible information. If you have questions about this medicine, talk to your doctor, pharmacist, or health care provider.  2018 [employer]/Gold Standard (2016-02-05 18:51:46) Carboplatin injection What is this medicine? CARBOPLATIN (KAR boe pla tin) is a chemotherapy drug. It targets fast dividing cells, like cancer cells, and causes these cells to die. This medicine is used to treat ovarian cancer and many other cancers. This medicine may be used for other purposes; ask your health care provider or pharmacist if you have questions. COMMON BRAND NAME(S): Paraplatin What should I tell my health care provider before I take this medicine?  They need to know if you have any of these conditions: -blood disorders -hearing problems -kidney disease -recent or ongoing radiation therapy -an unusual or allergic reaction to carboplatin, cisplatin, other chemotherapy, other medicines, foods, dyes, or preservatives -pregnant or trying to get pregnant -breast-feeding How should I use this medicine? This drug is usually given as an infusion into a vein. It is administered in a hospital or clinic by a specially trained health care professional. Talk to your pediatrician regarding the use of this medicine in children. Special care may be needed. Overdosage: If you  think you have taken too much of this medicine contact a poison control center or emergency room at once. NOTE: This medicine is only for you. Do not share this medicine with others. What if I miss a dose? It is important not to miss a dose. Call your doctor or health care professional if you are unable to keep an appointment. What may interact with this medicine? -medicines for seizures -medicines to increase blood counts like filgrastim, pegfilgrastim, sargramostim -some antibiotics like amikacin, gentamicin, neomycin, streptomycin, tobramycin -vaccines Talk to your doctor or health care professional before taking any of these medicines: -acetaminophen -aspirin -ibuprofen -ketoprofen -naproxen This list may not describe all possible interactions. Give your health care provider a list of all the medicines, herbs, non-prescription drugs, or dietary supplements you use. Also tell them if you smoke, drink alcohol, or use illegal drugs. Some items may interact with your medicine. What should I watch for while using this medicine? Your condition will be monitored carefully while you are receiving this medicine. You will need important blood work done while you are taking this medicine. This drug may make you feel generally unwell. This is not uncommon, as chemotherapy can affect healthy cells as well as cancer cells. Report any side effects. Continue your course of treatment even though you feel ill unless your doctor tells you to stop. In some cases, you may be given additional medicines to help with side effects. Follow all directions for their use. Call your doctor or health care professional for advice if you get a fever, chills or sore throat, or other symptoms of a cold or flu. Do not treat yourself. This drug decreases your body's ability to fight infections. Try to avoid being around people who are sick. This medicine may increase your risk to bruise or bleed. Call your doctor or health care  professional if you notice any unusual bleeding. Be careful brushing and flossing your teeth or using a toothpick because you may get an infection or bleed more easily. If you have any dental work done, tell your dentist you are receiving this medicine. Avoid taking products that contain aspirin, acetaminophen, ibuprofen, naproxen, or ketoprofen unless instructed by your doctor. These medicines may hide a fever. Do not become pregnant while taking this medicine. Women should inform their doctor if they wish to become pregnant or think they might be pregnant. There is a potential for serious side effects to an unborn child. Talk to your health care professional or pharmacist for more information. Do not breast-feed an infant while taking this medicine. What side effects may I notice from receiving this medicine? Side effects that you should report to your doctor or health care professional as soon as possible: -allergic reactions like skin rash, itching or hives, swelling of the face, lips, or tongue -signs of infection - fever or chills, cough, sore throat, pain or difficulty passing urine -signs of decreased platelets or bleeding -  bruising, pinpoint red spots on the skin, black, tarry stools, nosebleeds -signs of decreased red blood cells - unusually weak or tired, fainting spells, lightheadedness -breathing problems -changes in hearing -changes in vision -chest pain -high blood pressure -low blood counts - This drug may decrease the number of white blood cells, red blood cells and platelets. You may be at increased risk for infections and bleeding. -nausea and vomiting -pain, swelling, redness or irritation at the injection site -pain, tingling, numbness in the hands or feet -problems with balance, talking, walking -trouble passing urine or change in the amount of urine Side effects that usually do not require medical attention (report to your doctor or health care professional if they  continue or are bothersome): -hair loss -loss of appetite -metallic taste in the mouth or changes in taste This list may not describe all possible side effects. Call your doctor for medical advice about side effects. You may report side effects to FDA at 1-800-FDA-1088. Where should I keep my medicine? This drug is given in a hospital or clinic and will not be stored at home. NOTE: This sheet is a summary. It may not cover all possible information. If you have questions about this medicine, talk to your doctor, pharmacist, or health care provider.  2018 [employer]/Gold Standard (2007-07-13 14:38:05)

## 2018-03-08 ENCOUNTER — Inpatient Hospital Stay: Payer: BLUE CROSS/BLUE SHIELD

## 2018-03-08 ENCOUNTER — Encounter: Payer: Self-pay | Admitting: Hospice and Palliative Medicine

## 2018-03-08 ENCOUNTER — Other Ambulatory Visit: Payer: Self-pay

## 2018-03-08 ENCOUNTER — Inpatient Hospital Stay (HOSPITAL_BASED_OUTPATIENT_CLINIC_OR_DEPARTMENT_OTHER): Payer: BLUE CROSS/BLUE SHIELD | Admitting: Hospice and Palliative Medicine

## 2018-03-08 VITALS — BP 158/92 | HR 73 | Temp 97.0°F | Resp 18 | Wt 182.5 lb

## 2018-03-08 DIAGNOSIS — Z87891 Personal history of nicotine dependence: Secondary | ICD-10-CM

## 2018-03-08 DIAGNOSIS — Z515 Encounter for palliative care: Secondary | ICD-10-CM | POA: Diagnosis not present

## 2018-03-08 DIAGNOSIS — F32A Depression, unspecified: Secondary | ICD-10-CM

## 2018-03-08 DIAGNOSIS — F419 Anxiety disorder, unspecified: Secondary | ICD-10-CM

## 2018-03-08 DIAGNOSIS — C773 Secondary and unspecified malignant neoplasm of axilla and upper limb lymph nodes: Secondary | ICD-10-CM

## 2018-03-08 DIAGNOSIS — Z8541 Personal history of malignant neoplasm of cervix uteri: Secondary | ICD-10-CM

## 2018-03-08 DIAGNOSIS — R590 Localized enlarged lymph nodes: Secondary | ICD-10-CM

## 2018-03-08 DIAGNOSIS — F329 Major depressive disorder, single episode, unspecified: Secondary | ICD-10-CM

## 2018-03-08 DIAGNOSIS — J449 Chronic obstructive pulmonary disease, unspecified: Secondary | ICD-10-CM

## 2018-03-08 DIAGNOSIS — C3490 Malignant neoplasm of unspecified part of unspecified bronchus or lung: Secondary | ICD-10-CM

## 2018-03-08 NOTE — Progress Notes (Signed)
[place]  Telephone:([phone] [phone]  Patient Care Team: Juline Patch, MD as PCP - General (Family Medicine) Telford Nab, RN as Registered Nurse   Name of the patient: Kristina Wright  [id number]  [birth year]   Date of Visit: 03/08/18  Diagnosis: Stage IV adenocarcinoma of the lung  Current Treatment:  Pembrolizumab, Pemetrexed, Carboplatin   Reason for Visit: This patient is a 62 y.o. female who presents to chemo care clinic today for initial meeting in preparation for starting chemotherapy. I introduced the chemo care clinic and we discussed that the role of the clinic is to assist those who are at an increased risk of emergency room visits and/or complications during the course of chemotherapy treatment. We discussed that the increased risk takes into account factors such as age, performance status, and co-morbidities. We also discussed that for some, this might include barriers to care such as not having a primary care provider, lack of insurance/transportation, or not being able to afford medications. We discussed that the goal of the program is to help prevent unplanned ER visits and help reduce complications during chemotherapy. We do this by discussing specific risk factors to each individual and identifying ways that we can help improve these risk factors and reduce barriers to care.   Hematology/Oncology History:    Malignant neoplasm of lung ([place])   03/01/2018 Initial Diagnosis    Malignant neoplasm of lung ([place])    03/01/2018 -  Chemotherapy    The patient had PALONOSETRON HCL INJECTION 0.25 MG/5ML, 0.25 mg, Intravenous,  Once, 0 of 4 cycles PEMEtrexed (ALIMTA) 1,000 mg in sodium chloride 0.9 % 100 mL chemo infusion, 500 mg/m2, Intravenous,  Once, 0 of 6 cycles CARBOplatin (PARAPLATIN) in sodium chloride 0.9 % 100 mL chemo infusion, , Intravenous,  Once, 0 of 4 cycles pembrolizumab (KEYTRUDA) 200 mg in  sodium chloride 0.9 % 50 mL chemo infusion, 200 mg, Intravenous, Once, 0 of 6 cycles FOSAPREPITANT 150MG  + DEXAMETHASONE INFUSION [facility], , Intravenous,  Once, 0 of 4 cycles  for chemotherapy treatment.       No Known Allergies   Past Medical History:  Diagnosis Date  . Cancer ([place]) 1989   cervical  . Depression   . Hyperlipidemia   . Hypertension   . Malignant neoplasm of lung ([place]) 03/01/2018  . Thyroid disease      Past Surgical History:  Procedure Laterality Date  . BILATERAL CARPAL TUNNEL RELEASE    . BREAST CYST ASPIRATION Left   . LUNG BIOPSY    . PARTIAL HYSTERECTOMY    . PORTA CATH INSERTION N/A 03/03/2018   Procedure: PORTA CATH INSERTION;  Surgeon: Katha Cabal, MD;  Location: [facility];  Service: Cardiovascular;  Laterality: N/A;  . TUBAL LIGATION      Social History   Socioeconomic History  . Marital status: Divorced    Spouse name: Not on file  . Number of children: 3  . Years of education: Not on file  . Highest education level: Not on file  Occupational History  . Not on file  Social Needs  . Financial resource strain: Not on file  . Food insecurity:    Worry: Not on file    Inability: Not on file  . Transportation needs:    Medical: Not on file    Non-medical: Not on file  Tobacco Use  . Smoking status: Former Smoker    Packs/day: 1.00  Years: 25.00    Pack years: 25.00    Types: Cigarettes    Last attempt to quit: 11/19/2017    Years since quitting: 0.2  . Smokeless tobacco: Never Used  Substance and Sexual Activity  . Alcohol use: No  . Drug use: Yes    Types: Marijuana    Comment: occasional  . Sexual activity: Not on file  Lifestyle  . Physical activity:    Days per week: Not on file    Minutes per session: Not on file  . Stress: Not on file  Relationships  . Social connections:    Talks on phone: Not on file    Gets together: Not on file    Attends religious service: Not on file    Active member of club  or organization: Not on file    Attends meetings of clubs or organizations: Not on file    Relationship status: Not on file  . Intimate partner violence:    Fear of current or ex partner: Not on file    Emotionally abused: Not on file    Physically abused: Not on file    Forced sexual activity: Not on file  Other Topics Concern  . Not on file  Social History Narrative  . Not on file    Family History  Problem Relation Age of Onset  . Colon cancer Mother 85  . Colon cancer Maternal Grandmother 78  . Hypertension Father   . Breast cancer Sister 36  . Throat cancer Brother   . Heart attack Maternal Aunt   . Ovarian cancer Maternal Aunt   . Stomach cancer Paternal Grandmother   . Throat cancer Brother     Current Outpatient Medications  Medication Sig Dispense Refill  . albuterol (PROVENTIL HFA;VENTOLIN HFA) 108 (90 Base) MCG/ACT inhaler Inhale 2 puffs into the lungs every 6 (six) hours as needed for wheezing or shortness of breath. 1 Inhaler 0  . ALPRAZolam (XANAX) 0.25 MG tablet Take 1 tablet (0.25 mg total) by mouth 2 (two) times daily as needed for anxiety. 30 tablet 0  . aspirin EC 81 MG tablet Take 81 mg by mouth daily.    Marland Kitchen atorvastatin (LIPITOR) 10 MG tablet TAKE 1 TABLET BY MOUTH ONCE DAILY IN THE MORNING (Patient taking differently: Take 10 mg by mouth daily. ) 30 tablet 1  . busPIRone (BUSPAR) 5 MG tablet Take 5 mg by mouth daily.    . clopidogrel (PLAVIX) 75 MG tablet Take 1 tablet (75 mg total) by mouth daily. 30 tablet 1  . FLUoxetine (PROZAC) 10 MG capsule Take 10 mg by mouth daily.    . folic acid (FOLVITE) 1 MG tablet Take 1 tablet (1 mg total) by mouth daily. Start 7-14 days before Alimta chemotherapy. 100 tablet 3  . levothyroxine (SYNTHROID, LEVOTHROID) 75 MCG tablet Take 1 tablet (75 mcg total) by mouth daily. 30 tablet 1  . lidocaine-prilocaine (EMLA) cream Apply to affected area once 30 g 3  . lisinopril-hydrochlorothiazide (PRINZIDE,ZESTORETIC) 10-12.5 MG  tablet Take 1 tablet by mouth daily. 90 tablet 1  . ondansetron (ZOFRAN) 8 MG tablet Take 1 tablet (8 mg total) by mouth 2 (two) times daily as needed (Nausea or vomiting). Start if needed on the third day after chemotherapy. 30 tablet 1  . prochlorperazine (COMPAZINE) 10 MG tablet Take 1 tablet (10 mg total) by mouth every 6 (six) hours as needed (Nausea or vomiting). 30 tablet 1   No current facility-administered medications for this visit.  PERFORMANCE STATUS (ECOG) : 1 - Symptomatic but completely ambulatory  Review of Systems As noted above. Otherwise, a complete review of systems is negative.  Physical Exam General: NAD, frail appearing, thin Pulmonary: unlabored Extremities: no edema, no joint deformities Skin: no rashes Neurological: grossly nonfocal Psych: smiling, calm  Assessment and Plan:    1. Cancer: stage IV lung cancer starting chemotherapy. Patient is pending MRI tomorrow and has follow up appointment with Dr. Tasia Catchings. She will receive treatment in [place].   2. High Risk for ER/Hospitalization during Chemotherapy: We discussed the role of the chemo care clinic and identified patient specific risk factors. I discussed that patient was identified as high risk primarily based on: comorbidities. We also discussed the role of the Symptom Management and Palliative Care Clinics at [facility] and methods of contacting clinic/provider. She denies needing specific assistance at this time.  Current PCP: Juline Patch, MD  Hospital Admissions: 0  ED Visits: 0  Has Medicaid: No  Has Medicare: No  In relationship: No  Has Anemia: No  Has asthma: No  Has atrial fibrillation: No  Has CVD: No  Has chronic kidney disease: No  Has Chronic Obstructive Pulmonary Disease: Yes  Has Congestive Heart Failure: No  Has Connective Tissue Disorder: No  Has Depression: Yes  Has Diabetes: No  Has liver disease: No  Has Peripheral Vascular Disease: Yes    3. Social Determinants of Health:     Housing - lives at home with her son who is in college. Has no housing concerns.  Food - Denies any concerns. Discussed the food bank.   Transportation - She drives and says she has family who can bring her to clinic appointments if needed. Discussed the [place] service.   Utilities - Denies any specific concerns. We talked about financial assistance if needed.  Safety - She verbalizes feeling safe in her environment  Financial Strain - She plans to apply for disability. However, she currently denies financial concerns.   Employment - She is currently employed as a Programme researcher, broadcasting/film/video at [employer].  Family/Community Support - She is divorced but her ex-husband is involved in her care. She has three adult-children who are also involved.    4. Co-morbidities Complicating Care: Patient last had a COPD exacerbation in 12/2017. She is followed closely by her PCP. We discussed medication management in the home. Patient reports compliance with meds. She has some difficulty removing pill caps. I suggested that she use pill box and request easy remove lids from pharmacy.   Patient's anxiety and depression were reportedly worse following the diagnosis of cancer. Patient says she is now doing better with both. She was prescribed alprazolam, which she is taking as needed. She has Buspar and Prozac, both of which can be increased if needed. We talked today about support groups and counseling if needed.    Patient expressed understanding and was in agreement with this plan. She also understands that She can call clinic at any time with any questions, concerns, or complaints.   A total of (15) minutes of face-to-face time was spent with this patient with greater than 50% of that time in counseling and care-coordination.   Signed by: Altha Harm, PhD, DNP, NP-C, [facility] [phone] (Work Cell)

## 2018-03-08 NOTE — Progress Notes (Signed)
Patient here for chemo care.

## 2018-03-10 ENCOUNTER — Ambulatory Visit
Admission: RE | Admit: 2018-03-10 | Discharge: 2018-03-10 | Disposition: A | Discharge status: Home / Self Care | Payer: BLUE CROSS/BLUE SHIELD | Source: Ambulatory Visit | Attending: Oncology | Admitting: Oncology

## 2018-03-10 DIAGNOSIS — I6782 Cerebral ischemia: Secondary | ICD-10-CM | POA: Diagnosis not present

## 2018-03-10 DIAGNOSIS — C3490 Malignant neoplasm of unspecified part of unspecified bronchus or lung: Secondary | ICD-10-CM | POA: Diagnosis not present

## 2018-03-10 LAB — POCT I-STAT CREATININE: CREATININE: 0.8 mg/dL (ref 0.44–1.00)

## 2018-03-10 MED ORDER — GADOBUTROL 1 MMOL/ML IV SOLN
8.0000 mL | Freq: Once | INTRAVENOUS | Status: AC | PRN
  Administered 2018-03-10: 8 mL via INTRAVENOUS

## 2018-03-11 ENCOUNTER — Other Ambulatory Visit: Payer: Self-pay

## 2018-03-11 ENCOUNTER — Inpatient Hospital Stay: Payer: BLUE CROSS/BLUE SHIELD

## 2018-03-11 ENCOUNTER — Encounter: Payer: Self-pay | Admitting: Oncology

## 2018-03-11 ENCOUNTER — Inpatient Hospital Stay (HOSPITAL_BASED_OUTPATIENT_CLINIC_OR_DEPARTMENT_OTHER): Payer: BLUE CROSS/BLUE SHIELD | Admitting: Oncology

## 2018-03-11 VITALS — BP 147/86 | HR 87 | Temp 98.2°F | Wt 182.3 lb

## 2018-03-11 DIAGNOSIS — C3490 Malignant neoplasm of unspecified part of unspecified bronchus or lung: Secondary | ICD-10-CM

## 2018-03-11 DIAGNOSIS — C773 Secondary and unspecified malignant neoplasm of axilla and upper limb lymph nodes: Secondary | ICD-10-CM | POA: Diagnosis not present

## 2018-03-11 DIAGNOSIS — Z801 Family history of malignant neoplasm of trachea, bronchus and lung: Secondary | ICD-10-CM

## 2018-03-11 DIAGNOSIS — Z8541 Personal history of malignant neoplasm of cervix uteri: Secondary | ICD-10-CM

## 2018-03-11 DIAGNOSIS — F329 Major depressive disorder, single episode, unspecified: Secondary | ICD-10-CM

## 2018-03-11 DIAGNOSIS — R590 Localized enlarged lymph nodes: Secondary | ICD-10-CM | POA: Diagnosis not present

## 2018-03-11 DIAGNOSIS — Z808 Family history of malignant neoplasm of other organs or systems: Secondary | ICD-10-CM

## 2018-03-11 DIAGNOSIS — Z803 Family history of malignant neoplasm of breast: Secondary | ICD-10-CM

## 2018-03-11 DIAGNOSIS — Z87891 Personal history of nicotine dependence: Secondary | ICD-10-CM

## 2018-03-11 DIAGNOSIS — F419 Anxiety disorder, unspecified: Secondary | ICD-10-CM

## 2018-03-11 DIAGNOSIS — C801 Malignant (primary) neoplasm, unspecified: Principal | ICD-10-CM

## 2018-03-11 DIAGNOSIS — Z8041 Family history of malignant neoplasm of ovary: Secondary | ICD-10-CM

## 2018-03-11 DIAGNOSIS — F32A Depression, unspecified: Secondary | ICD-10-CM

## 2018-03-11 DIAGNOSIS — C7800 Secondary malignant neoplasm of unspecified lung: Secondary | ICD-10-CM

## 2018-03-11 DIAGNOSIS — Z7189 Other specified counseling: Secondary | ICD-10-CM

## 2018-03-11 DIAGNOSIS — Z800 Family history of malignant neoplasm of digestive organs: Secondary | ICD-10-CM

## 2018-03-11 MED ORDER — CYANOCOBALAMIN 1000 MCG/ML IJ SOLN
1000.0000 ug | Freq: Once | INTRAMUSCULAR | Status: AC
  Administered 2018-03-11: 1000 ug via INTRAMUSCULAR

## 2018-03-12 DIAGNOSIS — F32A Depression, unspecified: Secondary | ICD-10-CM | POA: Insufficient documentation

## 2018-03-12 DIAGNOSIS — F329 Major depressive disorder, single episode, unspecified: Secondary | ICD-10-CM | POA: Insufficient documentation

## 2018-03-12 NOTE — Progress Notes (Signed)
Hematology/Oncology follow up note [facility] Telephone:([phone] Fax:(336) [phone]   Patient Care Team: Juline Patch, MD as PCP - General (Family Medicine) Telford Nab, RN as Registered Nurse  REFERRING PROVIDER: Juline Patch, MD REASON FOR VISIT:  Follow-up on lung cancer management.  HISTORY OF PRESENTING ILLNESS:  Kristina Wright is a  62 y.o.  female with PMH listed below who was referred to me for evaluation of lung nodule Patient recently saw chronic care physician for evaluation of shortness of breath, intermittent.  Symptoms are aggravated by emotional upset. Also chest pain, 4 out of 10, substernal region.  No radiation.  As work-up, patient had chest x-ray work-up which showed scattered nodular appearing densities in the bases.  Noncontrast CT is recommended for evaluation. Outside CT 01/19/2018 chest CT without contrast showed slightly irregular pulmonary nodule within the anterior left lower lobe, measuring 8.7 x 5.8 mm and is suspicious requiring follow-up or further evaluation.  There are no other concerning pulmonary nodules.  There is a focal pleural nodule at the right middle lobe minor fissure measures 3.1 mm thickness and 9.2 mm in length.  This is most likely a benign linear scar or lymphoid tissue of the pleura.  Mediastinal adenopathy with a precarinal lymph node mass measuring 1.7 x 3.7 cm.  There is also a right hilar lymph node measuring approximately 1.7 cm.  Difficult to measure without contrast. Patient was referred to cancer center for further evaluation. She is accompanied by her sister to the clinic today. Denies any hemoptysis, shortness of breath more than baseline, wheezing, coughing, chest pain, abdominal pain, headache, double vision. She appears really nervous and anxious about the abnormal findings on CT scan.  She has a history of depression and anxiety.  Takes BuSpar, Prozac.   She has a history of 30-year pack smoking  history, quit 6 months ago.  Weight loss about 4 pounds in the past 3 months. Significant family history of cancer.  Her sister follows up with me for breast cancer.  Other family member cancer history listed in the family history section.  Self-report history of cervical cancer in 1989.  INTERVAL HISTORY Kristina Wright is a 62 y.o. female who has above history reviewed by me today to continue discussion of management of newly diagnosed lung cancer. She feels overwhelmed with her cancer diagnosis and requests more discussion.   During the interval, she has got medi port placed and has chemotherapy class.  Also had MRI brain done and presents to discuss results.  # Anxiety has been relatively well controlled with current regimen Prozac, Buspar and Xanax prn.  # Chronic SOB at baseline.  Accompanied with friend.   Review of Systems  Constitutional: Positive for malaise/fatigue. Negative for chills, fever and weight loss.  HENT: Negative for nosebleeds and sore throat.   Eyes: Negative for double vision, photophobia and redness.  Respiratory: Positive for shortness of breath. Negative for cough and wheezing.   Cardiovascular: Negative for chest pain, palpitations, orthopnea and leg swelling.  Gastrointestinal: Negative for abdominal pain, blood in stool, nausea and vomiting.  Genitourinary: Negative for dysuria.  Musculoskeletal: Negative for back pain, myalgias and neck pain.  Skin: Negative for itching and rash.  Neurological: Negative for dizziness, tingling and tremors.  Endo/Heme/Allergies: Negative for environmental allergies. Does not bruise/bleed easily.  Psychiatric/Behavioral: Negative for depression and hallucinations. The patient is nervous/anxious.     MEDICAL HISTORY:  Past Medical History:  Diagnosis Date  . Cancer (  [place]) 1989   cervical  . Depression   . Hyperlipidemia   . Hypertension   . Malignant neoplasm of lung ([place]) 03/01/2018  . Thyroid disease     SURGICAL  HISTORY: Past Surgical History:  Procedure Laterality Date  . BILATERAL CARPAL TUNNEL RELEASE    . BREAST CYST ASPIRATION Left   . LUNG BIOPSY    . PARTIAL HYSTERECTOMY    . PORTA CATH INSERTION N/A 03/03/2018   Procedure: PORTA CATH INSERTION;  Surgeon: Katha Cabal, MD;  Location: [facility];  Service: Cardiovascular;  Laterality: N/A;  . TUBAL LIGATION      SOCIAL HISTORY: Social History   Socioeconomic History  . Marital status: Divorced    Spouse name: Not on file  . Number of children: 3  . Years of education: Not on file  . Highest education level: Not on file  Occupational History  . Not on file  Social Needs  . Financial resource strain: Not on file  . Food insecurity:    Worry: Not on file    Inability: Not on file  . Transportation needs:    Medical: Not on file    Non-medical: Not on file  Tobacco Use  . Smoking status: Former Smoker    Packs/day: 1.00    Years: 25.00    Pack years: 25.00    Types: Cigarettes    Last attempt to quit: 11/19/2017    Years since quitting: 0.3  . Smokeless tobacco: Never Used  Substance and Sexual Activity  . Alcohol use: No  . Drug use: Yes    Types: Marijuana    Comment: occasional  . Sexual activity: Not on file  Lifestyle  . Physical activity:    Days per week: Not on file    Minutes per session: Not on file  . Stress: Not on file  Relationships  . Social connections:    Talks on phone: Not on file    Gets together: Not on file    Attends religious service: Not on file    Active member of club or organization: Not on file    Attends meetings of clubs or organizations: Not on file    Relationship status: Not on file  . Intimate partner violence:    Fear of current or ex partner: Not on file    Emotionally abused: Not on file    Physically abused: Not on file    Forced sexual activity: Not on file  Other Topics Concern  . Not on file  Social History Narrative  . Not on file    FAMILY  HISTORY: Family History  Problem Relation Age of Onset  . Colon cancer Mother 62  . Colon cancer Maternal Grandmother 78  . Hypertension Father   . Breast cancer Sister 4  . Throat cancer Brother   . Heart attack Maternal Aunt   . Ovarian cancer Maternal Aunt   . Stomach cancer Paternal Grandmother   . Throat cancer Brother     ALLERGIES:  has No Known Allergies.  MEDICATIONS:  Current Outpatient Medications  Medication Sig Dispense Refill  . albuterol (PROVENTIL HFA;VENTOLIN HFA) 108 (90 Base) MCG/ACT inhaler Inhale 2 puffs into the lungs every 6 (six) hours as needed for wheezing or shortness of breath. 1 Inhaler 0  . ALPRAZolam (XANAX) 0.25 MG tablet Take 1 tablet (0.25 mg total) by mouth 2 (two) times daily as needed for anxiety. 30 tablet 0  . aspirin EC 81 MG  tablet Take 81 mg by mouth daily.    Marland Kitchen atorvastatin (LIPITOR) 10 MG tablet TAKE 1 TABLET BY MOUTH ONCE DAILY IN THE MORNING (Patient taking differently: Take 10 mg by mouth daily. ) 30 tablet 1  . busPIRone (BUSPAR) 5 MG tablet Take 5 mg by mouth daily.    Marland Kitchen FLUoxetine (PROZAC) 10 MG capsule Take 10 mg by mouth daily.    . folic acid (FOLVITE) 1 MG tablet Take 1 tablet (1 mg total) by mouth daily. Start 7-14 days before Alimta chemotherapy. 100 tablet 3  . levothyroxine (SYNTHROID, LEVOTHROID) 75 MCG tablet Take 1 tablet (75 mcg total) by mouth daily. 30 tablet 1  . lidocaine-prilocaine (EMLA) cream Apply to affected area once 30 g 3  . lisinopril-hydrochlorothiazide (PRINZIDE,ZESTORETIC) 10-12.5 MG tablet Take 1 tablet by mouth daily. 90 tablet 1  . ondansetron (ZOFRAN) 8 MG tablet Take 1 tablet (8 mg total) by mouth 2 (two) times daily as needed (Nausea or vomiting). Start if needed on the third day after chemotherapy. 30 tablet 1  . prochlorperazine (COMPAZINE) 10 MG tablet Take 1 tablet (10 mg total) by mouth every 6 (six) hours as needed (Nausea or vomiting). 30 tablet 1  . clopidogrel (PLAVIX) 75 MG tablet Take 1  tablet (75 mg total) by mouth daily. (Patient not taking: Reported on 03/11/2018) 30 tablet 1   No current facility-administered medications for this visit.      PHYSICAL EXAMINATION: ECOG PERFORMANCE STATUS: 1 - Symptomatic but completely ambulatory Vitals:   03/11/18 1109  BP: (!) 147/86  Pulse: 87  Temp: 98.2 F (36.8 C)   Filed Weights   03/11/18 1109  Weight: 182 lb 5.1 oz (82.7 kg)    Physical Exam  Constitutional: She is oriented to person, place, and time. No distress.  HENT:  Head: Normocephalic and atraumatic.  Mouth/Throat: Oropharynx is clear and moist.  Eyes: Pupils are equal, round, and reactive to light. EOM are normal. No scleral icterus.  Neck: Normal range of motion. Neck supple.  Cardiovascular: Normal rate, regular rhythm and normal heart sounds.  Pulmonary/Chest: Effort normal. No respiratory distress. She has no wheezes.  Decreased breath sound bilaterally.  Abdominal: Soft. Bowel sounds are normal. She exhibits no distension and no mass. There is no tenderness.  Musculoskeletal: Normal range of motion. She exhibits no edema or deformity.  Neurological: She is alert and oriented to person, place, and time. No cranial nerve deficit. Coordination normal.  Skin: Skin is warm and dry. No rash noted. No erythema.  Psychiatric: She has a normal mood and affect. Her behavior is normal. Thought content normal.  Anxious     LABORATORY DATA:  I have reviewed the data as listed No results found for: WBC, HGB, HCT, MCV, PLT Recent Labs    01/22/18 1353 03/10/18 0825  NA 140  --   K 4.1  --   CL 99  --   CO2 23  --   GLUCOSE 100*  --   BUN 11  --   CREATININE 0.82 0.80  CALCIUM 9.6  --   GFRNONAA 79  --   GFRAA 91  --   PROT 7.0  --   ALBUMIN 3.9  --   AST 13  --   ALT 13  --   ALKPHOS 106  --   BILITOT 0.3  --   BILIDIR 0.09  --    Iron/TIBC/Ferritin/ %Sat No results found for: IRON, TIBC, FERRITIN, IRONPCTSAT   RADIOGRAPHIC STUDIES: I  have personally reviewed the radiological images as listed and agreed with the findings in the report. 02/09/2018 PET scan  1. Unusual pattern of nodal metastasis with intensely hypermetabolic enlarged precarinal lymph node, RIGHT hilar lymph node and RIGHT axillary lymph node. Recommend biopsy of the RIGHT axial lymph node. 2. No metabolic activity associated with the RIGHT lobe pulmonary nodule. 3. No additional evidence primary or metastatic carcinoma  Diagnostic mammogram with [place] axillary were obtained.Image was independently reviewed by me. No suspicious nodule on mammogram.  There is right breast nodule on PET scan , no hypermetabolic activity.  03/10/2018 MRI brain No evidence of metastatic disease. Moderate chronic small-vessel ischemic changes of the cerebral hemispheric white matter and pons  ASSESSMENT & PLAN:  1. Metastatic adenocarcinoma to lung with unknown primary site, unspecified laterality ([place])   2. Goals of care, counseling/discussion   3. Depression, unspecified depression type   Cancer Staging Malignant neoplasm of lung [facility]) Staging form: Lung, AJCC 8th Edition - Clinical stage from 03/12/2018: Stage IV (cT1, cN3, pM1) - Signed by Earlie Server, MD on 03/12/2018  # MRI brain was independently reviewed and discussed with patient.  No evidence of intracranial metastasis  Omniseq testing pending.   The cancer diagnosis and care plan were discussed again with patient in detail.  NCCN guidelines were reviewed and shared with patient.   Plan Carboplatin Alimta Keytruda for a few cycles and if no progression, will consider proceed with definitive chemotherapy and Radiation, followed by definitive axillary radiation or resection.    Patient has questions about chemotherapy and immunotherapy. I discussed again with her answered all her questions. Proceed with B12 injection today.  I also discussed about the availability of clinical trial that is available here. Patient is  interested and I will refer her case to research department for screening.   # Anxiety/depression, continue prozac and buspar. Xanax as instructed.  # Family history of cancer, need to be referred to genetic testing.   We spent sufficient time to discuss many aspect of care, questions were answered to patient's satisfaction. The patient knows to call the clinic with any problems questions or concerns.  Return of visit: 03/25/2018 if patient declines clinical trial.  Total face to face encounter time for this patient visit was 25 min. >50% of the time was  spent in counseling and coordination of care.    Earlie Server, MD, PhD Hematology Oncology [facility] Pager- [phone] 03/12/2018

## 2018-03-15 ENCOUNTER — Encounter: Payer: Self-pay | Admitting: *Deleted

## 2018-03-15 NOTE — Progress Notes (Unsigned)
[facility] [id number] NSCLC Consent form: Spoke with patient by phone on Friday - 03/12/18 following her visit with Dr. Tasia Catchings and after patient expressed interest in the afore mentioned clinical trial. Brief overview of study provided for patient by phone and she was receptive to having informed consent form emailed to her for review over the weekend. Patient called this afternoon, as previously planned, to follow up on her study interest. States she read through the consent form twice and has concerns about some of the potential side effects of the study drug, and that she already has some of these problems. The main concern she expressed was that she wants to stay at the [facility] for her treatments and does not want to come back to [facility] for the study. Patient was thanked for considering the study and will follow for possible LungMAP participation in the future. Yolande Jolly, BSN, MHA, OCN 03/15/2018 2:12 PM

## 2018-03-16 ENCOUNTER — Encounter: Payer: Self-pay | Admitting: Oncology

## 2018-03-16 ENCOUNTER — Ambulatory Visit (INDEPENDENT_AMBULATORY_CARE_PROVIDER_SITE_OTHER): Payer: BLUE CROSS/BLUE SHIELD | Admitting: Family Medicine

## 2018-03-16 ENCOUNTER — Encounter: Payer: Self-pay | Admitting: Family Medicine

## 2018-03-16 VITALS — BP 130/70 | HR 80 | Ht 66.0 in | Wt 180.0 lb

## 2018-03-16 DIAGNOSIS — F419 Anxiety disorder, unspecified: Secondary | ICD-10-CM

## 2018-03-16 DIAGNOSIS — F330 Major depressive disorder, recurrent, mild: Secondary | ICD-10-CM

## 2018-03-16 DIAGNOSIS — E782 Mixed hyperlipidemia: Secondary | ICD-10-CM

## 2018-03-16 DIAGNOSIS — E039 Hypothyroidism, unspecified: Secondary | ICD-10-CM | POA: Diagnosis not present

## 2018-03-16 DIAGNOSIS — I739 Peripheral vascular disease, unspecified: Secondary | ICD-10-CM | POA: Diagnosis not present

## 2018-03-16 DIAGNOSIS — I10 Essential (primary) hypertension: Secondary | ICD-10-CM | POA: Diagnosis not present

## 2018-03-16 MED ORDER — LISINOPRIL-HYDROCHLOROTHIAZIDE 10-12.5 MG PO TABS: 1.0000 | ORAL_TABLET | Freq: Every day | ORAL | 1 refills | Status: DC

## 2018-03-16 MED ORDER — FLUOXETINE HCL 10 MG PO CAPS: 10.0000 mg | ORAL_CAPSULE | Freq: Every day | ORAL | 1 refills | Status: DC

## 2018-03-16 MED ORDER — ATORVASTATIN CALCIUM 10 MG PO TABS: 10.0000 mg | ORAL_TABLET | Freq: Every day | ORAL | 1 refills | Status: DC

## 2018-03-16 MED ORDER — CLOPIDOGREL BISULFATE 75 MG PO TABS: 75.0000 mg | ORAL_TABLET | Freq: Every day | ORAL | 1 refills | Status: DC

## 2018-03-16 MED ORDER — BUSPIRONE HCL 5 MG PO TABS: 5.0000 mg | ORAL_TABLET | Freq: Every day | ORAL | 1 refills | Status: DC

## 2018-03-16 MED ORDER — LEVOTHYROXINE SODIUM 75 MCG PO TABS: 75.0000 ug | ORAL_TABLET | Freq: Every day | ORAL | 1 refills | Status: DC

## 2018-03-16 MED ORDER — ALPRAZOLAM 0.25 MG PO TABS: 0.2500 mg | ORAL_TABLET | Freq: Two times a day (BID) | ORAL | 0 refills | Status: DC | PRN

## 2018-03-16 NOTE — Progress Notes (Signed)
Date:  03/16/2018   Name:  Kristina Wright   DOB:  11-Nov-1955   MRN:  [id number]   Chief Complaint: Hypertension (check b/p due to it being elevated when going to cancer center. 168/90 and 140/93) Hypertension  This is a chronic problem. The current episode started more than 1 year ago. The problem is controlled. Pertinent negatives include no anxiety, blurred vision, chest pain, headaches, malaise/fatigue, neck pain, orthopnea, palpitations, peripheral edema, PND, shortness of breath or sweats. There are no associated agents to hypertension. There are no known risk factors for coronary artery disease. Past treatments include ACE inhibitors and diuretics. The current treatment provides moderate improvement. There are no compliance problems.  There is no history of angina, kidney disease, CAD/MI, CVA, heart failure, left ventricular hypertrophy, PVD or retinopathy. Identifiable causes of hypertension include a thyroid problem. There is no history of chronic renal disease, a hypertension causing med or renovascular disease.  Thyroid Problem  Presents for follow-up visit. Patient reports no anxiety, cold intolerance, constipation, depressed mood, diaphoresis, diarrhea, dry skin, fatigue, hair loss, heat intolerance, hoarse voice, leg swelling, nail problem, palpitations, tremors, visual change, weight gain or weight loss. The symptoms have been stable. There is no history of heart failure.  Anxiety  Presents for follow-up visit. Patient reports no chest pain, compulsions, confusion, decreased concentration, depressed mood, dizziness, dry mouth, excessive worry, feeling of choking, hyperventilation, impotence, insomnia, irritability, malaise, muscle tension, nausea, nervous/anxious behavior, obsessions, palpitations, panic, restlessness, shortness of breath or suicidal ideas. Symptoms occur constantly. The severity of symptoms is mild.    Depression         This is a chronic problem.  The current  episode started more than 1 year ago.   The onset quality is sudden.   The problem occurs intermittently.  The problem has been waxing and waning since onset.  Associated symptoms include no decreased concentration, no fatigue, no helplessness, no hopelessness, does not have insomnia, not irritable, no restlessness, no decreased interest, no appetite change, no body aches, no myalgias, no headaches, no indigestion, not sad and no suicidal ideas.  Past treatments include SSRIs - Selective serotonin reuptake inhibitors.  Compliance with treatment is good.  Previous treatment provided mild relief.  Past medical history includes thyroid problem.     Pertinent negatives include no anxiety.    Review of Systems  Constitutional: Negative for appetite change, diaphoresis, fatigue, irritability, malaise/fatigue, weight gain and weight loss.  HENT: Negative for hoarse voice.   Eyes: Negative for blurred vision.  Respiratory: Negative for shortness of breath.   Cardiovascular: Negative for chest pain, palpitations, orthopnea and PND.  Gastrointestinal: Negative for constipation, diarrhea and nausea.  Endocrine: Negative for cold intolerance and heat intolerance.  Genitourinary: Negative for impotence.  Musculoskeletal: Negative for myalgias and neck pain.  Neurological: Negative for dizziness, tremors and headaches.  Psychiatric/Behavioral: Positive for depression. Negative for confusion, decreased concentration and suicidal ideas. The patient is not nervous/anxious and does not have insomnia.     Patient Active Problem List   Diagnosis Date Noted  . Depression 03/12/2018  . Malignant neoplasm of lung ([place]) 03/01/2018  . Goals of care, counseling/discussion 03/01/2018  . Tobacco use disorder 02/08/2016  . Essential hypertension 02/08/2016  . PVD (peripheral vascular disease) ([place]) 02/08/2016  . Blue toe syndrome of right lower extremity ([place]) 02/08/2016    No Known Allergies  Past Surgical  History:  Procedure Laterality Date  . BILATERAL CARPAL TUNNEL RELEASE    .  BREAST CYST ASPIRATION Left   . LUNG BIOPSY    . PARTIAL HYSTERECTOMY    . PORTA CATH INSERTION N/A 03/03/2018   Procedure: PORTA CATH INSERTION;  Surgeon: Katha Cabal, MD;  Location: [facility];  Service: Cardiovascular;  Laterality: N/A;  . TUBAL LIGATION      Social History   Tobacco Use  . Smoking status: Former Smoker    Packs/day: 1.00    Years: 25.00    Pack years: 25.00    Types: Cigarettes    Last attempt to quit: 11/19/2017    Years since quitting: 0.3  . Smokeless tobacco: Never Used  Substance Use Topics  . Alcohol use: No  . Drug use: Yes    Types: Marijuana    Comment: occasional     Medication list has been reviewed and updated.  Current Meds  Medication Sig  . ALPRAZolam (XANAX) 0.25 MG tablet Take 1 tablet (0.25 mg total) by mouth 2 (two) times daily as needed for anxiety.  Marland Kitchen aspirin EC 81 MG tablet Take 81 mg by mouth daily.  Marland Kitchen atorvastatin (LIPITOR) 10 MG tablet Take 1 tablet (10 mg total) by mouth daily.  . busPIRone (BUSPAR) 5 MG tablet Take 1 tablet (5 mg total) by mouth daily.  . clopidogrel (PLAVIX) 75 MG tablet Take 1 tablet (75 mg total) by mouth daily.  Marland Kitchen FLUoxetine (PROZAC) 10 MG capsule Take 1 capsule (10 mg total) by mouth daily.  . folic acid (FOLVITE) 1 MG tablet Take 1 tablet (1 mg total) by mouth daily. Start 7-14 days before [place] chemotherapy.  Marland Kitchen levothyroxine (SYNTHROID, LEVOTHROID) 75 MCG tablet Take 1 tablet (75 mcg total) by mouth daily.  Marland Kitchen lidocaine-prilocaine (EMLA) cream Apply to affected area once  . lisinopril-hydrochlorothiazide (PRINZIDE,ZESTORETIC) 10-12.5 MG tablet Take 1 tablet by mouth daily.  . ondansetron (ZOFRAN) 8 MG tablet Take 1 tablet (8 mg total) by mouth 2 (two) times daily as needed (Nausea or vomiting). Start if needed on the third day after chemotherapy.  . prochlorperazine (COMPAZINE) 10 MG tablet Take 1 tablet (10 mg  total) by mouth every 6 (six) hours as needed (Nausea or vomiting).  . [DISCONTINUED] ALPRAZolam (XANAX) 0.25 MG tablet Take 1 tablet (0.25 mg total) by mouth 2 (two) times daily as needed for anxiety.  . [DISCONTINUED] atorvastatin (LIPITOR) 10 MG tablet TAKE 1 TABLET BY MOUTH ONCE DAILY IN THE MORNING (Patient taking differently: Take 10 mg by mouth daily. )  . [DISCONTINUED] busPIRone (BUSPAR) 5 MG tablet Take 5 mg by mouth daily.  . [DISCONTINUED] clopidogrel (PLAVIX) 75 MG tablet Take 1 tablet (75 mg total) by mouth daily.  . [DISCONTINUED] FLUoxetine (PROZAC) 10 MG capsule Take 10 mg by mouth daily.  . [DISCONTINUED] levothyroxine (SYNTHROID, LEVOTHROID) 75 MCG tablet Take 1 tablet (75 mcg total) by mouth daily.  . [DISCONTINUED] lisinopril-hydrochlorothiazide (PRINZIDE,ZESTORETIC) 10-12.5 MG tablet Take 1 tablet by mouth daily.    PHQ 2/9 Scores 01/07/2018 09/29/2017 07/20/2017  PHQ - 2 Score 1 2 6   PHQ- 9 Score 6 4 18     Physical Exam  Constitutional: She is oriented to person, place, and time. She is not irritable.  Neurological: She is alert and oriented to person, place, and time.  Skin: Skin is warm and dry.  Psychiatric: She has a normal mood and affect. Her behavior is normal. Judgment and thought content normal.  Nursing note and vitals reviewed.   BP 130/70   Pulse 80   Ht 5'  6" (1.676 m)   Wt 180 lb (81.6 kg)   BMI 29.05 kg/m   Assessment and Plan:  1. Essential hypertension Stable on med- refil lisinopril/HCTZ - lisinopril-hydrochlorothiazide (PRINZIDE,ZESTORETIC) 10-12.5 MG tablet; Take 1 tablet by mouth daily.  Dispense: 90 tablet; Refill: 1  2. Hypothyroidism, unspecified type Stable on med- refill levothyroxine/ Oncology will draw TSH - levothyroxine (SYNTHROID, LEVOTHROID) 75 MCG tablet; Take 1 tablet (75 mcg total) by mouth daily.  Dispense: 90 tablet; Refill: 1  3. PVD (peripheral vascular disease) ([place]) Stable on med- refill plavix - clopidogrel  (PLAVIX) 75 MG tablet; Take 1 tablet (75 mg total) by mouth daily.  Dispense: 90 tablet; Refill: 1  4. Anxiety Stable on med- refill buspirone and Alprazolam - busPIRone (BUSPAR) 5 MG tablet; Take 1 tablet (5 mg total) by mouth daily.  Dispense: 90 tablet; Refill: 1 - ALPRAZolam (XANAX) 0.25 MG tablet; Take 1 tablet (0.25 mg total) by mouth 2 (two) times daily as needed for anxiety.  Dispense: 30 tablet; Refill: 0  5. Mild episode of recurrent major depressive disorder (HCC) Stable on med- refill fluoxetine - FLUoxetine (PROZAC) 10 MG capsule; Take 1 capsule (10 mg total) by mouth daily.  Dispense: 90 capsule; Refill: 1  6. Mixed hyperlipidemia Stable on med- refill atorvastatin - atorvastatin (LIPITOR) 10 MG tablet; Take 1 tablet (10 mg total) by mouth daily.  Dispense: 90 tablet; Refill: 1  7. Discussed upcoming treatment plan for cancer. Starts treatment on Dec 5,19. Will check in with patient that day  Dr. Otilio Miu [facility] [place] [facility]  03/16/2018

## 2018-03-17 ENCOUNTER — Encounter: Payer: Self-pay | Admitting: Oncology

## 2018-03-22 ENCOUNTER — Telehealth: Payer: Self-pay | Admitting: Pharmacist

## 2018-03-22 ENCOUNTER — Telehealth: Payer: Self-pay | Admitting: Pharmacy Technician

## 2018-03-22 DIAGNOSIS — C7800 Secondary malignant neoplasm of unspecified lung: Secondary | ICD-10-CM

## 2018-03-22 DIAGNOSIS — C801 Malignant (primary) neoplasm, unspecified: Principal | ICD-10-CM

## 2018-03-22 NOTE — Telephone Encounter (Signed)
Oral Oncology Pharmacist Encounter  Received new prescription for Tagrisso (osimertinib) for the treatment of EGFR positive NSCLC, planned duration until disease progression or unacceptable drug toxicity. 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 There is evidence that these uncommon mutations responded to osimertinib treatment.  Prescription dose and frequency assessed.   Current medication list in Epic reviewed, one relevant DDIs with osimetinib identified: - Ondansetron: Ondansetron may enhance the QTc-prolonging effect of QT-prolonging Kinase Inhibitors like osimertinib. This interaction is more significant for IV ondansetron, pt is prescribed oral ondansetron for prn use. If patient starts to use her ondansetron regularly consider monitoring QTc.   Prescription has been e-scribed to the [facility] for benefits analysis and approval.  Oral Oncology Clinic will continue to follow for insurance authorization, copayment issues, initial counseling and start date.  Darl Pikes, PharmD, BCPS, [facility] Hematology/Oncology Clinical Pharmacist [facility] [place] [facility] [phone]  03/22/2018 10:30 AM

## 2018-03-22 NOTE — Telephone Encounter (Signed)
Oral Oncology Patient Advocate Encounter  Received notification from [facility] that prior authorization for Tagrisso is required.  PA submitted on CoverMyMeds Key [id number] Status is pending  Oral Oncology Clinic will continue to follow.  [place] Patient [place] Phone [phone] Fax [phone] 03/22/2018 12:11 PM

## 2018-03-23 NOTE — Telephone Encounter (Signed)
Oral Oncology Patient Advocate Encounter  Prior Authorization for [place] has been approved.    PA# [id number] Effective dates: 03/22/18 through 03/21/19  Patients co-pay is $0.00.  Insurance limits quantity to 15 tablets for 1-3 months for new therapy.  Oral Oncology Clinic will continue to follow.   [place] Patient [place] Phone [phone] Fax [phone] 03/23/2018 3:13 PM

## 2018-03-24 MED ORDER — OSIMERTINIB MESYLATE 80 MG PO TABS: 80.0000 mg | ORAL_TABLET | Freq: Every day | ORAL | 2 refills | Status: DC

## 2018-03-24 MED FILL — TAGRISSO 80 MG TABLET: 80 | 15 days supply | Qty: 15 | Fill #0

## 2018-03-24 NOTE — Telephone Encounter (Signed)
Scheduled medication to be delivered 03/25/18.

## 2018-03-24 NOTE — Telephone Encounter (Signed)
Oral Chemotherapy Pharmacist Encounter  Patient Education I spoke with patient for overview of new oral chemotherapy medication: Tagrisso (osimertinib) for the treatment of EGFR positive NSCLC, planned duration until disease progression or unacceptable drug toxicity. 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 There is evidence that these uncommon mutations responded to osimertinib treatment.  Counseled patient on administration, dosing, side effects, monitoring, drug-food interactions, safe handling, storage, and disposal. Patient will take 1 tablet (80 mg total) by mouth daily.  Side effects include but not limited to: rash, decreased wbc/plt/hgb, diarrhea.    Reviewed with patient importance of keeping a medication schedule and plan for any missed doses.  Kristina Wright voiced understanding and appreciation. All questions answered. Medication handout placed in the mail.  Provided patient with Oral [facility] phone number. Patient knows to call the office with questions or concerns. Oral Chemotherapy Navigation Clinic will continue to follow.  Darl Pikes, PharmD, BCPS, [facility] Hematology/Oncology Clinical Pharmacist [facility] [place] [phone]  03/24/2018 11:13 AM

## 2018-03-25 ENCOUNTER — Inpatient Hospital Stay: Payer: BLUE CROSS/BLUE SHIELD | Attending: Oncology | Admitting: Oncology

## 2018-03-25 ENCOUNTER — Encounter: Payer: Self-pay | Admitting: Oncology

## 2018-03-25 ENCOUNTER — Other Ambulatory Visit: Payer: BLUE CROSS/BLUE SHIELD

## 2018-03-25 ENCOUNTER — Other Ambulatory Visit: Payer: Self-pay

## 2018-03-25 ENCOUNTER — Ambulatory Visit: Payer: BLUE CROSS/BLUE SHIELD

## 2018-03-25 VITALS — BP 142/88 | HR 85 | Temp 98.0°F | Resp 18 | Wt 185.0 lb

## 2018-03-25 DIAGNOSIS — Z803 Family history of malignant neoplasm of breast: Secondary | ICD-10-CM | POA: Insufficient documentation

## 2018-03-25 DIAGNOSIS — Z8041 Family history of malignant neoplasm of ovary: Secondary | ICD-10-CM | POA: Diagnosis not present

## 2018-03-25 DIAGNOSIS — C3490 Malignant neoplasm of unspecified part of unspecified bronchus or lung: Secondary | ICD-10-CM | POA: Insufficient documentation

## 2018-03-25 DIAGNOSIS — Z800 Family history of malignant neoplasm of digestive organs: Secondary | ICD-10-CM | POA: Diagnosis not present

## 2018-03-25 NOTE — Progress Notes (Signed)
Patient here today for follow up.  Patient states no new concerns today  

## 2018-03-25 NOTE — Progress Notes (Signed)
Hematology/Oncology follow up note [facility] Telephone:([phone] Fax:(336) [phone]   Patient Care Team: Juline Patch, MD as PCP - General (Family Medicine) Telford Nab, RN as Registered Nurse  REFERRING PROVIDER: Juline Patch, MD REASON FOR VISIT:  Follow-up on lung cancer management.  HISTORY OF PRESENTING ILLNESS:  Kristina Wright is a  62 y.o.  female with PMH listed below who was referred to me for evaluation of lung nodule Patient recently saw chronic care physician for evaluation of shortness of breath, intermittent.  Symptoms are aggravated by emotional upset. Also chest pain, 4 out of 10, substernal region.  No radiation.  As work-up, patient had chest x-ray work-up which showed scattered nodular appearing densities in the bases.  Noncontrast CT is recommended for evaluation. Outside CT 01/19/2018 chest CT without contrast showed slightly irregular pulmonary nodule within the anterior left lower lobe, measuring 8.7 x 5.8 mm and is suspicious requiring follow-up or further evaluation.  There are no other concerning pulmonary nodules.  There is a focal pleural nodule at the right middle lobe minor fissure measures 3.1 mm thickness and 9.2 mm in length.  This is most likely a benign linear scar or lymphoid tissue of the pleura.  Mediastinal adenopathy with a precarinal lymph node mass measuring 1.7 x 3.7 cm.  There is also a right hilar lymph node measuring approximately 1.7 cm.  Difficult to measure without contrast. Patient was referred to cancer center for further evaluation. She is accompanied by her sister to the clinic today. Denies any hemoptysis, shortness of breath more than baseline, wheezing, coughing, chest pain, abdominal pain, headache, double vision. She appears really nervous and anxious about the abnormal findings on CT scan.  She has a history of depression and anxiety.  Takes BuSpar, Prozac.   She has a history of 30-year pack smoking  history, quit 6 months ago.  Weight loss about 4 pounds in the past 3 months. Significant family history of cancer.  Her sister follows up with me for breast cancer.  Other family member cancer history listed in the family history section.  Self-report history of cervical cancer in 1989.  INTERVAL HISTORY Kristina Wright is a 62 y.o. female who has above history reviewed by me today to continue discussion of management of stage IV lung adenocarcinoma.  During the interval,omniseq testing came back, patient's tumor is positive for EGFR L861Q. Mutation. Patient presents to discuss about targeted therapy with EGFR inhibitors.  Osimertinib has been approved by her insurance and will be shipped to her later today.  Review of Systems  Constitutional: Positive for malaise/fatigue. Negative for chills, fever and weight loss.  HENT: Negative for nosebleeds and sore throat.   Eyes: Negative for double vision, photophobia and redness.  Respiratory: Positive for shortness of breath. Negative for cough and wheezing.   Cardiovascular: Negative for chest pain, palpitations, orthopnea and leg swelling.  Gastrointestinal: Negative for abdominal pain, blood in stool, nausea and vomiting.  Genitourinary: Negative for dysuria.  Musculoskeletal: Negative for back pain, myalgias and neck pain.  Skin: Negative for itching and rash.  Neurological: Negative for dizziness, tingling and tremors.  Endo/Heme/Allergies: Negative for environmental allergies. Does not bruise/bleed easily.  Psychiatric/Behavioral: Negative for depression and hallucinations. The patient is not nervous/anxious.     MEDICAL HISTORY:  Past Medical History:  Diagnosis Date  . Cancer ([place]) 1989   cervical  . Depression   . Hyperlipidemia   . Hypertension   . Malignant neoplasm of  lung ([place]) 03/01/2018  . Thyroid disease     SURGICAL HISTORY: Past Surgical History:  Procedure Laterality Date  . BILATERAL CARPAL TUNNEL RELEASE    .  BREAST CYST ASPIRATION Left   . LUNG BIOPSY    . PARTIAL HYSTERECTOMY    . PORTA CATH INSERTION N/A 03/03/2018   Procedure: PORTA CATH INSERTION;  Surgeon: Katha Cabal, MD;  Location: [facility];  Service: Cardiovascular;  Laterality: N/A;  . TUBAL LIGATION      SOCIAL HISTORY: Social History   Socioeconomic History  . Marital status: Divorced    Spouse name: Not on file  . Number of children: 3  . Years of education: Not on file  . Highest education level: Not on file  Occupational History  . Not on file  Social Needs  . Financial resource strain: Not on file  . Food insecurity:    Worry: Not on file    Inability: Not on file  . Transportation needs:    Medical: Not on file    Non-medical: Not on file  Tobacco Use  . Smoking status: Former Smoker    Packs/day: 1.00    Years: 25.00    Pack years: 25.00    Types: Cigarettes    Last attempt to quit: 11/19/2017    Years since quitting: 0.3  . Smokeless tobacco: Never Used  Substance and Sexual Activity  . Alcohol use: No  . Drug use: Yes    Types: Marijuana    Comment: occasional  . Sexual activity: Not on file  Lifestyle  . Physical activity:    Days per week: Not on file    Minutes per session: Not on file  . Stress: Not on file  Relationships  . Social connections:    Talks on phone: Not on file    Gets together: Not on file    Attends religious service: Not on file    Active member of club or organization: Not on file    Attends meetings of clubs or organizations: Not on file    Relationship status: Not on file  . Intimate partner violence:    Fear of current or ex partner: Not on file    Emotionally abused: Not on file    Physically abused: Not on file    Forced sexual activity: Not on file  Other Topics Concern  . Not on file  Social History Narrative  . Not on file    FAMILY HISTORY: Family History  Problem Relation Age of Onset  . Colon cancer Mother 60  . Colon cancer Maternal  Grandmother 78  . Hypertension Father   . Breast cancer Sister 25  . Throat cancer Brother   . Heart attack Maternal Aunt   . Ovarian cancer Maternal Aunt   . Stomach cancer Paternal Grandmother   . Throat cancer Brother     ALLERGIES:  has No Known Allergies.  MEDICATIONS:  Current Outpatient Medications  Medication Sig Dispense Refill  . albuterol (PROVENTIL HFA;VENTOLIN HFA) 108 (90 Base) MCG/ACT inhaler Inhale 2 puffs into the lungs every 6 (six) hours as needed for wheezing or shortness of breath. 1 Inhaler 0  . ALPRAZolam (XANAX) 0.25 MG tablet Take 1 tablet (0.25 mg total) by mouth 2 (two) times daily as needed for anxiety. 30 tablet 0  . aspirin EC 81 MG tablet Take 81 mg by mouth daily.    Marland Kitchen atorvastatin (LIPITOR) 10 MG tablet Take 1 tablet (10 mg total)  by mouth daily. 90 tablet 1  . busPIRone (BUSPAR) 5 MG tablet Take 1 tablet (5 mg total) by mouth daily. 90 tablet 1  . clopidogrel (PLAVIX) 75 MG tablet Take 1 tablet (75 mg total) by mouth daily. 90 tablet 1  . FLUoxetine (PROZAC) 10 MG capsule Take 1 capsule (10 mg total) by mouth daily. 90 capsule 1  . folic acid (FOLVITE) 1 MG tablet Take 1 tablet (1 mg total) by mouth daily. Start 7-14 days before [place] chemotherapy. 100 tablet 3  . levothyroxine (SYNTHROID, LEVOTHROID) 75 MCG tablet Take 1 tablet (75 mcg total) by mouth daily. 90 tablet 1  . lidocaine-prilocaine (EMLA) cream Apply to affected area once 30 g 3  . lisinopril-hydrochlorothiazide (PRINZIDE,ZESTORETIC) 10-12.5 MG tablet Take 1 tablet by mouth daily. 90 tablet 1  . ondansetron (ZOFRAN) 8 MG tablet Take 1 tablet (8 mg total) by mouth 2 (two) times daily as needed (Nausea or vomiting). Start if needed on the third day after chemotherapy. 30 tablet 1  . osimertinib mesylate (TAGRISSO) 80 MG tablet Take 1 tablet (80 mg total) by mouth daily. 30 tablet 2  . prochlorperazine (COMPAZINE) 10 MG tablet Take 1 tablet (10 mg total) by mouth every 6 (six) hours as needed  (Nausea or vomiting). 30 tablet 1   No current facility-administered medications for this visit.      PHYSICAL EXAMINATION: ECOG PERFORMANCE STATUS: 1 - Symptomatic but completely ambulatory Vitals:   03/25/18 0844  BP: (!) 142/88  Pulse: 85  Resp: 18  Temp: 98 F (36.7 C)   Filed Weights   03/25/18 0844  Weight: 184 lb 15.5 oz (83.9 kg)    Physical Exam  Constitutional: She is oriented to person, place, and time. No distress.  HENT:  Head: Normocephalic and atraumatic.  Mouth/Throat: Oropharynx is clear and moist.  Eyes: Pupils are equal, round, and reactive to light. EOM are normal. No scleral icterus.  Neck: Normal range of motion. Neck supple.  Cardiovascular: Normal rate, regular rhythm and normal heart sounds.  Pulmonary/Chest: Effort normal. No respiratory distress. She has no wheezes.  Decreased breath sound bilaterally.  Abdominal: Soft. Bowel sounds are normal. She exhibits no distension and no mass. There is no tenderness.  Musculoskeletal: Normal range of motion. She exhibits no edema or deformity.  Neurological: She is alert and oriented to person, place, and time. No cranial nerve deficit. Coordination normal.  Skin: Skin is warm and dry. No rash noted. No erythema.  Psychiatric: She has a normal mood and affect. Her behavior is normal. Thought content normal.     LABORATORY DATA:  I have reviewed the data as listed No results found for: WBC, HGB, HCT, MCV, PLT Recent Labs    01/22/18 1353 03/10/18 0825  NA 140  --   K 4.1  --   CL 99  --   CO2 23  --   GLUCOSE 100*  --   BUN 11  --   CREATININE 0.82 0.80  CALCIUM 9.6  --   GFRNONAA 79  --   GFRAA 91  --   PROT 7.0  --   ALBUMIN 3.9  --   AST 13  --   ALT 13  --   ALKPHOS 106  --   BILITOT 0.3  --   BILIDIR 0.09  --    Iron/TIBC/Ferritin/ %Sat No results found for: IRON, TIBC, FERRITIN, IRONPCTSAT   RADIOGRAPHIC STUDIES: I have personally reviewed the radiological images as listed and  agreed with the findings in the report. 02/09/2018 PET scan  1. Unusual pattern of nodal metastasis with intensely hypermetabolic enlarged precarinal lymph node, RIGHT hilar lymph node and RIGHT axillary lymph node. Recommend biopsy of the RIGHT axial lymph node. 2. No metabolic activity associated with the RIGHT lobe pulmonary nodule. 3. No additional evidence primary or metastatic carcinoma  Diagnostic mammogram with [place] axillary were obtained.Image was independently reviewed by me. No suspicious nodule on mammogram.  There is right breast nodule on PET scan , no hypermetabolic activity.  03/10/2018 MRI brain No evidence of metastatic disease. Moderate chronic small-vessel ischemic changes of the cerebral hemispheric white matter and pons  ASSESSMENT & PLAN:  1. Malignant neoplasm of lung, unspecified laterality, unspecified part of lung ([place])   2. EGFR-related lung cancer [facility])   Cancer Staging Malignant neoplasm of lung [facility]) Staging form: Lung, AJCC 8th Edition - Clinical stage from 03/12/2018: Stage IV (cT1, cN3, pM1) - Signed by Earlie Server, MD on 03/12/2018  # EGFR mutation status was discussed with patient.  Afatanib is the other option, however less tolerable due to side effect profile.  Based on below data, I recommend patient to start Osimertinib 8m daily.  . Phase II, open-label study evaluated the efficacy and safety of osimertinib 80 mg orally once daily, used as multiple lines of therapy, in 36 patients with metastatic NSCLC having uncommon EGFR mutations (other than exon 19 deletion, L858R, T790M, and insertions in exon 20).8 The following mutations co-occurred with T790M mutation and best observed responses are listed below: o [id number]  other co-occurring mutations: objective response rate (ORR)=57.9% o L861Q  other co-occurring mutations: ORR=77.8% o S768I  other co-occurring mutations: ORR=37.5% [[facility] [facility], [facility], et al.]  Rationale and potential side  effects of Osimertinib including but not limited to diarrhea, skin rash, mucositis, cardiology toxicities, interstitial lung disease, bone marrow suppression, drug drug interactions, were discussed with patient.  She voices understanding and willing to proceed with the treatment. We will start patient on osimertinib 80 mg daily.   # Family history of cancer, need to be referred to genetic testing. Discussed.  We spent sufficient time to discuss many aspect of care, questions were answered to patient's satisfaction.  The patient knows to call the clinic with any problems questions or concerns.  Return of visit: 2 weeks.  Total face to face encounter time for this patient visit was 25 min. >50% of the time was  spent in counseling and coordination of care.   ZEarlie Server MD, PhD Hematology Oncology CVeter[facility] Campusat AIdaho Eye Center PocatelloPager- [phone]/08/2017

## 2018-04-01 ENCOUNTER — Ambulatory Visit: Payer: BLUE CROSS/BLUE SHIELD | Admitting: Oncology

## 2018-04-05 MED FILL — TAGRISSO 80 MG TABLET: 80 | 15 days supply | Qty: 15 | Fill #1

## 2018-04-08 ENCOUNTER — Inpatient Hospital Stay: Payer: BLUE CROSS/BLUE SHIELD

## 2018-04-08 ENCOUNTER — Encounter: Payer: Self-pay | Admitting: Oncology

## 2018-04-08 ENCOUNTER — Ambulatory Visit: Payer: BLUE CROSS/BLUE SHIELD | Admitting: Oncology

## 2018-04-08 ENCOUNTER — Other Ambulatory Visit: Payer: Self-pay

## 2018-04-08 ENCOUNTER — Inpatient Hospital Stay (HOSPITAL_BASED_OUTPATIENT_CLINIC_OR_DEPARTMENT_OTHER): Payer: BLUE CROSS/BLUE SHIELD | Admitting: Oncology

## 2018-04-08 VITALS — BP 139/90 | HR 73 | Temp 96.8°F | Resp 18 | Wt 183.6 lb

## 2018-04-08 DIAGNOSIS — Z8041 Family history of malignant neoplasm of ovary: Secondary | ICD-10-CM | POA: Diagnosis not present

## 2018-04-08 DIAGNOSIS — Z800 Family history of malignant neoplasm of digestive organs: Secondary | ICD-10-CM | POA: Diagnosis not present

## 2018-04-08 DIAGNOSIS — Z803 Family history of malignant neoplasm of breast: Secondary | ICD-10-CM | POA: Diagnosis not present

## 2018-04-08 DIAGNOSIS — C3490 Malignant neoplasm of unspecified part of unspecified bronchus or lung: Secondary | ICD-10-CM

## 2018-04-08 DIAGNOSIS — Z808 Family history of malignant neoplasm of other organs or systems: Secondary | ICD-10-CM

## 2018-04-08 LAB — COMPREHENSIVE METABOLIC PANEL
ALBUMIN: 4 g/dL (ref 3.5–5.0)
ALT: 20 U/L (ref 0–44)
AST: 22 U/L (ref 15–41)
Alkaline Phosphatase: 79 U/L (ref 38–126)
Anion gap: 7 (ref 5–15)
BILIRUBIN TOTAL: 0.7 mg/dL (ref 0.3–1.2)
BUN: 12 mg/dL (ref 6–20)
CALCIUM: 9 mg/dL (ref 8.9–10.3)
CO2: 29 mmol/L (ref 22–32)
Chloride: 101 mmol/L (ref 98–111)
Creatinine, Ser: 0.92 mg/dL (ref 0.44–1.00)
GFR calc Af Amer: >60 mL/min (ref >60)
GLUCOSE: 97 mg/dL (ref 70–99)
POTASSIUM: 4.2 mmol/L (ref 3.5–5.1)
Sodium: 137 mmol/L (ref 135–145)
TOTAL PROTEIN: 7.8 g/dL (ref 6.5–8.1)

## 2018-04-08 LAB — CBC WITH DIFFERENTIAL/PLATELET
ABS IMMATURE GRANULOCYTES: 0.01 10*3/uL (ref 0.00–0.07)
BASOS PCT: 1 %
Basophils Absolute: 0.1 10*3/uL (ref 0.0–0.1)
Eosinophils Absolute: 0.2 10*3/uL (ref 0.0–0.5)
Eosinophils Relative: 3 %
HCT: 43.8 % (ref 36.0–46.0)
Hemoglobin: 14.4 g/dL (ref 12.0–15.0)
Immature Granulocytes: 0 %
Lymphocytes Relative: 34 %
Lymphs Abs: 2.2 10*3/uL (ref 0.7–4.0)
MCH: 31 pg (ref 26.0–34.0)
MCHC: 32.9 g/dL (ref 30.0–36.0)
MCV: 94.2 fL (ref 80.0–100.0)
MONO ABS: 0.4 10*3/uL (ref 0.1–1.0)
MONOS PCT: 6 %
NEUTROS ABS: 3.6 10*3/uL (ref 1.7–7.7)
Neutrophils Relative %: 56 %
PLATELETS: 249 10*3/uL (ref 150–400)
RBC: 4.65 MIL/uL (ref 3.87–5.11)
RDW: 12.5 % (ref 11.5–15.5)
WBC: 6.5 10*3/uL (ref 4.0–10.5)
nRBC: 0 % (ref 0.0–0.2)

## 2018-04-08 NOTE — Progress Notes (Signed)
Patient here for folllow up.

## 2018-04-08 NOTE — Progress Notes (Signed)
Hematology/Oncology follow up note [facility] [phone] Fax:([phone]   Patient Care Team: Juline Patch, MD as PCP - General (Family Medicine) Telford Nab, RN as Registered Nurse  REFERRING PROVIDER: Juline Patch, MD REASON FOR VISIT:  Follow-up on lung cancer management.  HISTORY OF PRESENTING ILLNESS:  Kristina Wright is a  62 y.o.  female with PMH listed below who was referred to me for evaluation of lung nodule Patient recently saw chronic care physician for evaluation of shortness of breath, intermittent.  Symptoms are aggravated by emotional upset. Also chest pain, 4 out of 10, substernal region.  No radiation.  As work-up, patient had chest x-ray work-up which showed scattered nodular appearing densities in the bases.  Noncontrast CT is recommended for evaluation. Outside CT 01/19/2018 chest CT without contrast showed slightly irregular pulmonary nodule within the anterior left lower lobe, measuring 8.7 x 5.8 mm and is suspicious requiring follow-up or further evaluation.  There are no other concerning pulmonary nodules.  There is a focal pleural nodule at the right middle lobe minor fissure measures 3.1 mm thickness and 9.2 mm in length.  This is most likely a benign linear scar or lymphoid tissue of the pleura.  Mediastinal adenopathy with a precarinal lymph node mass measuring 1.7 x 3.7 cm.  There is also a right hilar lymph node measuring approximately 1.7 cm.  Difficult to measure without contrast. Patient was referred to cancer center for further evaluation. She is accompanied by her sister to the clinic today. Denies any hemoptysis, shortness of breath more than baseline, wheezing, coughing, chest pain, abdominal pain, headache, double vision. She appears really nervous and anxious about the abnormal findings on CT scan.  She has a history of depression and anxiety.  Takes BuSpar, Prozac.   She has a history of 30-year pack smoking  history, quit 6 months ago.  Weight loss about 4 pounds in the past 3 months. Significant family history of cancer.  Her sister follows up with me for breast cancer.  Other family member cancer history listed in the family history section.  Self-report history of cervical cancer in 1989.  INTERVAL HISTORY Kristina Wright is a 62 y.o. female who has above history reviewed by me today present for assessment of anti-neoplasm treatment tolerability.  + EGFR L861Q. Mutation, started on osimertinib 2 weeks ago. Reports feeling well at baseline.  Denies any diarrhea, skin rash, worsening of shortness of breath, chest pain, abdominal pain No new complaints.   Review of Systems  Constitutional: Positive for malaise/fatigue. Negative for chills, fever and weight loss.  HENT: Negative for nosebleeds and sore throat.   Eyes: Negative for double vision, photophobia and redness.  Respiratory: Positive for shortness of breath. Negative for cough and wheezing.   Cardiovascular: Negative for chest pain, palpitations, orthopnea and leg swelling.  Gastrointestinal: Negative for abdominal pain, blood in stool, nausea and vomiting.  Genitourinary: Negative for dysuria.  Musculoskeletal: Negative for back pain, myalgias and neck pain.  Skin: Negative for itching and rash.  Neurological: Negative for dizziness, tingling and tremors.  Endo/Heme/Allergies: Negative for environmental allergies. Does not bruise/bleed easily.  Psychiatric/Behavioral: Negative for depression and hallucinations. The patient is not nervous/anxious.     MEDICAL HISTORY:  Past Medical History:  Diagnosis Date  . Cancer ([place]) 1989   cervical  . Depression   . Hyperlipidemia   . Hypertension   . Malignant neoplasm of lung ([place]) 03/01/2018  . Thyroid disease  SURGICAL HISTORY: Past Surgical History:  Procedure Laterality Date  . BILATERAL CARPAL TUNNEL RELEASE    . BREAST CYST ASPIRATION Left   . LUNG BIOPSY    . PARTIAL  HYSTERECTOMY    . PORTA CATH INSERTION N/A 03/03/2018   Procedure: PORTA CATH INSERTION;  Surgeon: Katha Cabal, MD;  Location: [facility];  Service: Cardiovascular;  Laterality: N/A;  . TUBAL LIGATION      SOCIAL HISTORY: Social History   Socioeconomic History  . Marital status: Divorced    Spouse name: Not on file  . Number of children: 3  . Years of education: Not on file  . Highest education level: Not on file  Occupational History  . Not on file  Social Needs  . Financial resource strain: Not on file  . Food insecurity:    Worry: Not on file    Inability: Not on file  . Transportation needs:    Medical: Not on file    Non-medical: Not on file  Tobacco Use  . Smoking status: Former Smoker    Packs/day: 1.00    Years: 25.00    Pack years: 25.00    Types: Cigarettes    Last attempt to quit: 11/19/2017    Years since quitting: 0.3  . Smokeless tobacco: Never Used  Substance and Sexual Activity  . Alcohol use: No  . Drug use: Yes    Types: Marijuana    Comment: occasional  . Sexual activity: Not on file  Lifestyle  . Physical activity:    Days per week: Not on file    Minutes per session: Not on file  . Stress: Not on file  Relationships  . Social connections:    Talks on phone: Not on file    Gets together: Not on file    Attends religious service: Not on file    Active member of club or organization: Not on file    Attends meetings of clubs or organizations: Not on file    Relationship status: Not on file  . Intimate partner violence:    Fear of current or ex partner: Not on file    Emotionally abused: Not on file    Physically abused: Not on file    Forced sexual activity: Not on file  Other Topics Concern  . Not on file  Social History Narrative  . Not on file    FAMILY HISTORY: Family History  Problem Relation Age of Onset  . Colon cancer Mother 2  . Colon cancer Maternal Grandmother 78  . Hypertension Father   . Breast cancer  Sister 61  . Throat cancer Brother   . Heart attack Maternal Aunt   . Ovarian cancer Maternal Aunt   . Stomach cancer Paternal Grandmother   . Throat cancer Brother     ALLERGIES:  has No Known Allergies.  MEDICATIONS:  Current Outpatient Medications  Medication Sig Dispense Refill  . albuterol (PROVENTIL HFA;VENTOLIN HFA) 108 (90 Base) MCG/ACT inhaler Inhale 2 puffs into the lungs every 6 (six) hours as needed for wheezing or shortness of breath. 1 Inhaler 0  . ALPRAZolam (XANAX) 0.25 MG tablet Take 1 tablet (0.25 mg total) by mouth 2 (two) times daily as needed for anxiety. 30 tablet 0  . aspirin EC 81 MG tablet Take 81 mg by mouth daily.    Marland Kitchen atorvastatin (LIPITOR) 10 MG tablet Take 1 tablet (10 mg total) by mouth daily. 90 tablet 1  . busPIRone (BUSPAR) 5  MG tablet Take 1 tablet (5 mg total) by mouth daily. 90 tablet 1  . clopidogrel (PLAVIX) 75 MG tablet Take 1 tablet (75 mg total) by mouth daily. 90 tablet 1  . FLUoxetine (PROZAC) 10 MG capsule Take 1 capsule (10 mg total) by mouth daily. 90 capsule 1  . levothyroxine (SYNTHROID, LEVOTHROID) 75 MCG tablet Take 1 tablet (75 mcg total) by mouth daily. 90 tablet 1  . lidocaine-prilocaine (EMLA) cream Apply to affected area once 30 g 3  . lisinopril-hydrochlorothiazide (PRINZIDE,ZESTORETIC) 10-12.5 MG tablet Take 1 tablet by mouth daily. 90 tablet 1  . ondansetron (ZOFRAN) 8 MG tablet Take 1 tablet (8 mg total) by mouth 2 (two) times daily as needed (Nausea or vomiting). Start if needed on the third day after chemotherapy. 30 tablet 1  . osimertinib mesylate (TAGRISSO) 80 MG tablet Take 1 tablet (80 mg total) by mouth daily. 30 tablet 2  . prochlorperazine (COMPAZINE) 10 MG tablet Take 1 tablet (10 mg total) by mouth every 6 (six) hours as needed (Nausea or vomiting). 30 tablet 1  . folic acid (FOLVITE) 1 MG tablet Take 1 tablet (1 mg total) by mouth daily. Start 7-14 days before [place] chemotherapy. (Patient not taking: Reported on  04/08/2018) 100 tablet 3   No current facility-administered medications for this visit.      PHYSICAL EXAMINATION: ECOG PERFORMANCE STATUS: 1 - Symptomatic but completely ambulatory Vitals:   04/08/18 0959  BP: 139/90  Pulse: 73  Resp: 18  Temp: (!) 96.8 F (36 C)  SpO2: 100%   Filed Weights   04/08/18 0959  Weight: 183 lb 10.3 oz (83.3 kg)    Physical Exam Constitutional:      General: She is not in acute distress. HENT:     Head: Normocephalic and atraumatic.  Eyes:     General: No scleral icterus.    Pupils: Pupils are equal, round, and reactive to light.  Neck:     Musculoskeletal: Normal range of motion and neck supple.  Cardiovascular:     Rate and Rhythm: Normal rate and regular rhythm.     Heart sounds: Normal heart sounds.  Pulmonary:     Effort: Pulmonary effort is normal. No respiratory distress.     Breath sounds: No wheezing.     Comments: Decreased breath sounds bilaterally Abdominal:     General: Bowel sounds are normal. There is no distension.     Palpations: Abdomen is soft. There is no mass.     Tenderness: There is no abdominal tenderness.  Musculoskeletal: Normal range of motion.        General: No deformity.  Skin:    General: Skin is warm and dry.     Findings: No erythema or rash.  Neurological:     Mental Status: She is alert and oriented to person, place, and time.     Cranial Nerves: No cranial nerve deficit.     Coordination: Coordination normal.  Psychiatric:        Behavior: Behavior normal.        Thought Content: Thought content normal.      LABORATORY DATA:  I have reviewed the data as listed Lab Results  Component Value Date   WBC 6.5 04/08/2018   HGB 14.4 04/08/2018   HCT 43.8 04/08/2018   MCV 94.2 04/08/2018   PLT 249 04/08/2018   Recent Labs    01/22/18 1353 03/10/18 0825 04/08/18 0950  NA 140  --  137  K 4.1  --  4.2  CL 99  --  101  CO2 23  --  29  GLUCOSE 100*  --  97  BUN 11  --  12  CREATININE  0.82 0.80 0.92  CALCIUM 9.6  --  9.0  GFRNONAA 79  --  >60  GFRAA 91  --  >60  PROT 7.0  --  7.8  ALBUMIN 3.9  --  4.0  AST 13  --  22  ALT 13  --  20  ALKPHOS 106  --  79  BILITOT 0.3  --  0.7  BILIDIR 0.09  --   --    Iron/TIBC/Ferritin/ %Sat No results found for: IRON, TIBC, FERRITIN, IRONPCTSAT   RADIOGRAPHIC STUDIES: I have personally reviewed the radiological images as listed and agreed with the findings in the report. 02/09/2018 PET scan  1. Unusual pattern of nodal metastasis with intensely hypermetabolic enlarged precarinal lymph node, RIGHT hilar lymph node and RIGHT axillary lymph node. Recommend biopsy of the RIGHT axial lymph node. 2. No metabolic activity associated with the RIGHT lobe pulmonary nodule. 3. No additional evidence primary or metastatic carcinoma  Diagnostic mammogram with [place] axillary were obtained.Image was independently reviewed by me. No suspicious nodule on mammogram.  There is right breast nodule on PET scan , no hypermetabolic activity.  03/10/2018 MRI brain No evidence of metastatic disease. Moderate chronic small-vessel ischemic changes of the cerebral hemispheric white matter and pons  ASSESSMENT & PLAN:  1. Malignant neoplasm of lung, unspecified laterality, unspecified part of lung ([place])   2. EGFR-related lung cancer ([place])   3. Family history of breast cancer in female   3. Family history of ovarian cancer   5. Family history of colon cancer   6. Family history of thyroid cancer   Cancer Staging Malignant neoplasm of lung [facility]) Staging form: Lung, AJCC 8th Edition - Clinical stage from 03/12/2018: Stage IV (cT1, cN3, pM1) - Signed by Earlie Server, MD on 03/12/2018  # EGFR mutation [G549I] positive Stage IV lung adenocarcinoma.  On osimertinib 80 mg daily, so far tolerating well.  No clinical signs of drug toxicity so far.  Use of osimertinib 73m daily is based on below study. Discussed with patient.  . Phase II, open-label study  evaluated the efficacy and safety of osimertinib 80 mg orally once daily, used as multiple lines of therapy, in 36 patients with metastatic NSCLC having uncommon EGFR mutations (other than exon 19 deletion, L858R, T790M, and insertions in exon 20).8 The following mutations co-occurred with T790M mutation and best observed responses are listed below: o G719X  other co-occurring mutations: objective response rate (ORR)=57.9% o L861Q  other co-occurring mutations: ORR=77.8% o S768I  other co-occurring mutations: ORR=37.5% [[facility] Sun J-M, Lee S-H, et al.]   # Family history of cancer, refer to genetic testing.    We spent sufficient time to discuss many aspect of care, questions were answered to patient's satisfaction. The patient knows to call the clinic with any problems questions or concerns.  Return of visit: 3 weeks to establish care with Dr.Corcoran.   ZEarlie Server MD, PhD Hematology Oncology [facility] Incat ASeabrook Emergency RoomPager- [phone]/19/2019

## 2018-04-16 ENCOUNTER — Other Ambulatory Visit: Payer: Self-pay

## 2018-04-16 DIAGNOSIS — F419 Anxiety disorder, unspecified: Secondary | ICD-10-CM

## 2018-04-16 MED ORDER — ALPRAZOLAM 0.25 MG PO TABS: 0.2500 mg | ORAL_TABLET | Freq: Two times a day (BID) | ORAL | 0 refills | Status: DC | PRN

## 2018-04-19 MED FILL — TAGRISSO 80 MG TABLET: 80 | 15 days supply | Qty: 15 | Fill #2

## 2018-04-27 ENCOUNTER — Encounter: Payer: Self-pay | Admitting: Family Medicine

## 2018-04-27 ENCOUNTER — Other Ambulatory Visit
Admission: RE | Admit: 2018-04-27 | Discharge: 2018-04-27 | Disposition: A | Discharge status: Home / Self Care | Payer: BLUE CROSS/BLUE SHIELD | Attending: Family Medicine | Admitting: Family Medicine

## 2018-04-27 ENCOUNTER — Ambulatory Visit (INDEPENDENT_AMBULATORY_CARE_PROVIDER_SITE_OTHER): Payer: BLUE CROSS/BLUE SHIELD | Admitting: Family Medicine

## 2018-04-27 VITALS — BP 136/70 | HR 80 | Ht 66.0 in | Wt 187.0 lb

## 2018-04-27 DIAGNOSIS — E039 Hypothyroidism, unspecified: Secondary | ICD-10-CM

## 2018-04-27 DIAGNOSIS — F419 Anxiety disorder, unspecified: Secondary | ICD-10-CM

## 2018-04-27 DIAGNOSIS — C7800 Secondary malignant neoplasm of unspecified lung: Secondary | ICD-10-CM

## 2018-04-27 DIAGNOSIS — F330 Major depressive disorder, recurrent, mild: Secondary | ICD-10-CM | POA: Diagnosis not present

## 2018-04-27 DIAGNOSIS — C801 Malignant (primary) neoplasm, unspecified: Secondary | ICD-10-CM

## 2018-04-27 MED ORDER — ALPRAZOLAM 0.25 MG PO TABS: 0.2500 mg | ORAL_TABLET | Freq: Two times a day (BID) | ORAL | 0 refills | Status: DC | PRN

## 2018-04-27 MED ORDER — FLUOXETINE HCL 10 MG PO CAPS: 10.0000 mg | ORAL_CAPSULE | Freq: Every day | ORAL | 1 refills | Status: DC

## 2018-04-27 MED ORDER — BUSPIRONE HCL 5 MG PO TABS: 5.0000 mg | ORAL_TABLET | Freq: Every day | ORAL | 1 refills | Status: DC

## 2018-04-27 NOTE — Progress Notes (Signed)
Date:  04/27/2018   Name:  Kristina Wright   DOB:  Jan 15, 1956   MRN:  [id number]   Chief Complaint: Anxiety (GAD7=9) and Hypothyroidism (needs recheck on tsh)  Anxiety  Presents for follow-up visit. Symptoms include excessive worry and nervous/anxious behavior. Patient reports no chest pain, compulsions, confusion, decreased concentration, depressed mood, dizziness, dry mouth, feeling of choking, hyperventilation, impotence, insomnia, irritability, malaise, muscle tension, nausea, obsessions, palpitations, panic, restlessness, shortness of breath or suicidal ideas. Symptoms occur most days. The severity of symptoms is mild.    Thyroid Problem  Presents for follow-up visit. Symptoms include anxiety. Patient reports no cold intolerance, constipation, depressed mood, diaphoresis, diarrhea, dry skin, fatigue, hair loss, heat intolerance, hoarse voice, leg swelling, menstrual problem, nail problem, palpitations, tremors, visual change, weight gain or weight loss. The symptoms have been improving.    Review of Systems  Constitutional: Negative.  Negative for chills, diaphoresis, fatigue, fever, irritability, unexpected weight change, weight gain and weight loss.  HENT: Negative for congestion, ear discharge, ear pain, hoarse voice, rhinorrhea, sinus pressure, sneezing and sore throat.   Eyes: Negative for photophobia, pain, discharge, redness and itching.  Respiratory: Negative for cough, shortness of breath, wheezing and stridor.   Cardiovascular: Negative for chest pain and palpitations.  Gastrointestinal: Negative for abdominal pain, blood in stool, constipation, diarrhea, nausea and vomiting.  Endocrine: Negative for cold intolerance, heat intolerance, polydipsia, polyphagia and polyuria.  Genitourinary: Negative for dysuria, flank pain, frequency, hematuria, impotence, menstrual problem, pelvic pain, urgency, vaginal bleeding and vaginal discharge.  Musculoskeletal: Negative for arthralgias,  back pain and myalgias.  Skin: Negative for rash.  Allergic/Immunologic: Negative for environmental allergies and food allergies.  Neurological: Negative for dizziness, tremors, weakness, light-headedness, numbness and headaches.  Hematological: Negative for adenopathy. Does not bruise/bleed easily.  Psychiatric/Behavioral: Negative for confusion, decreased concentration, dysphoric mood and suicidal ideas. The patient is nervous/anxious. The patient does not have insomnia.     Patient Active Problem List   Diagnosis Date Noted  . Depression 03/12/2018  . Malignant neoplasm of lung ([place]) 03/01/2018  . Goals of care, counseling/discussion 03/01/2018  . Tobacco use disorder 02/08/2016  . Essential hypertension 02/08/2016  . PVD (peripheral vascular disease) ([place]) 02/08/2016  . Blue toe syndrome of right lower extremity ([place]) 02/08/2016    No Known Allergies  Past Surgical History:  Procedure Laterality Date  . BILATERAL CARPAL TUNNEL RELEASE    . BREAST CYST ASPIRATION Left   . LUNG BIOPSY    . PARTIAL HYSTERECTOMY    . PORTA CATH INSERTION N/A 03/03/2018   Procedure: PORTA CATH INSERTION;  Surgeon: Katha Cabal, MD;  Location: [facility];  Service: Cardiovascular;  Laterality: N/A;  . TUBAL LIGATION      Social History   Tobacco Use  . Smoking status: Former Smoker    Packs/day: 1.00    Years: 25.00    Pack years: 25.00    Types: Cigarettes    Last attempt to quit: 11/19/2017    Years since quitting: 0.4  . Smokeless tobacco: Never Used  Substance Use Topics  . Alcohol use: No  . Drug use: Yes    Types: Marijuana    Comment: occasional     Medication list has been reviewed and updated.  Current Meds  Medication Sig  . albuterol (PROVENTIL HFA;VENTOLIN HFA) 108 (90 Base) MCG/ACT inhaler Inhale 2 puffs into the lungs every 6 (six) hours as needed for wheezing or shortness of breath.  Marland Kitchen  ALPRAZolam (XANAX) 0.25 MG tablet Take 1 tablet (0.25 mg total) by  mouth 2 (two) times daily as needed for anxiety.  Marland Kitchen aspirin EC 81 MG tablet Take 81 mg by mouth daily.  Marland Kitchen atorvastatin (LIPITOR) 10 MG tablet Take 1 tablet (10 mg total) by mouth daily.  . busPIRone (BUSPAR) 5 MG tablet Take 1 tablet (5 mg total) by mouth daily.  . clopidogrel (PLAVIX) 75 MG tablet Take 1 tablet (75 mg total) by mouth daily.  Marland Kitchen FLUoxetine (PROZAC) 10 MG capsule Take 1 capsule (10 mg total) by mouth daily.  Marland Kitchen levothyroxine (SYNTHROID, LEVOTHROID) 75 MCG tablet Take 1 tablet (75 mcg total) by mouth daily.  Marland Kitchen lidocaine-prilocaine (EMLA) cream Apply to affected area once  . lisinopril-hydrochlorothiazide (PRINZIDE,ZESTORETIC) 10-12.5 MG tablet Take 1 tablet by mouth daily.  . ondansetron (ZOFRAN) 8 MG tablet Take 1 tablet (8 mg total) by mouth 2 (two) times daily as needed (Nausea or vomiting). Start if needed on the third day after chemotherapy.  Marland Kitchen osimertinib mesylate (TAGRISSO) 80 MG tablet Take 1 tablet (80 mg total) by mouth daily.  . prochlorperazine (COMPAZINE) 10 MG tablet Take 1 tablet (10 mg total) by mouth every 6 (six) hours as needed (Nausea or vomiting).    PHQ 2/9 Scores 01/07/2018 09/29/2017 07/20/2017  PHQ - 2 Score 1 2 6   PHQ- 9 Score 6 4 18     Physical Exam Vitals signs and nursing note reviewed.  Constitutional:      General: She is not in acute distress.    Appearance: She is not diaphoretic.  HENT:     Head: Normocephalic and atraumatic.     Right Ear: External ear normal.     Left Ear: External ear normal.     Nose: Nose normal.  Eyes:     General:        Right eye: No discharge.        Left eye: No discharge.     Conjunctiva/sclera: Conjunctivae normal.     Pupils: Pupils are equal, round, and reactive to light.  Neck:     Musculoskeletal: Normal range of motion and neck supple.     Thyroid: No thyromegaly.     Vascular: No JVD.  Cardiovascular:     Rate and Rhythm: Normal rate and regular rhythm.     Heart sounds: Normal heart sounds. No  murmur. No friction rub. No gallop.   Pulmonary:     Effort: Pulmonary effort is normal.     Breath sounds: Normal breath sounds.  Abdominal:     General: Bowel sounds are normal.     Palpations: Abdomen is soft. There is no mass.     Tenderness: There is no abdominal tenderness. There is no guarding.  Musculoskeletal: Normal range of motion.  Lymphadenopathy:     Cervical: No cervical adenopathy.  Skin:    General: Skin is warm and dry.  Neurological:     Mental Status: She is alert and oriented to person, place, and time.     Cranial Nerves: No cranial nerve deficit.     Deep Tendon Reflexes: Reflexes are normal and symmetric.     BP 136/70   Pulse 80   Ht 5\' 6"  (1.676 m)   Wt 187 lb (84.8 kg)   BMI 30.18 kg/m   Assessment and Plan:  1. Anxiety Gad 7 score was 9 continue her on BuSpar 5 mg's daily.  May continue alprazolam 2.25 mg as needed for panic attacks. - busPIRone (  BUSPAR) 5 MG tablet; Take 1 tablet (5 mg total) by mouth daily.  Dispense: 90 tablet; Refill: 1  2. Mild episode of recurrent major depressive disorder (HCC) Chronic.  Persistent.  Continue Prozac 10 mg 1 a day. - FLUoxetine (PROZAC) 10 MG capsule; Take 1 capsule (10 mg total) by mouth daily.  Dispense: 90 capsule; Refill: 1  3. Hypothyroidism, unspecified type Chronic.  Will do TSH and adjust dosing of levothyroxine accordingly. - TSH

## 2018-04-29 ENCOUNTER — Inpatient Hospital Stay: Payer: BLUE CROSS/BLUE SHIELD | Admitting: Hematology and Oncology

## 2018-04-29 ENCOUNTER — Other Ambulatory Visit: Payer: BLUE CROSS/BLUE SHIELD

## 2018-05-05 ENCOUNTER — Telehealth: Payer: Self-pay | Admitting: Pharmacy Technician

## 2018-05-05 MED FILL — TAGRISSO 80 MG TABLET: 80 | 15 days supply | Qty: 15 | Fill #3

## 2018-05-05 NOTE — Telephone Encounter (Signed)
Oral Oncology Patient Advocate Encounter   Was successful in obtaining a copay card for [place].  This copay card will make the patients copay $0.00.  I have spoken with the patient.   The billing information is as follows and has been shared with [place].   RxBin: [id number] PCN: CN Member ID: [id number] Group ID: [id number]  I will have the copay card approval letter scanned into patients chart.  [place] Patient [place] Phone [phone] Fax [phone] 05/05/2018 10:17 AM

## 2018-05-06 ENCOUNTER — Other Ambulatory Visit: Payer: Self-pay | Admitting: Hematology and Oncology

## 2018-05-06 ENCOUNTER — Ambulatory Visit: Payer: BLUE CROSS/BLUE SHIELD | Admitting: Hematology and Oncology

## 2018-05-06 ENCOUNTER — Other Ambulatory Visit: Payer: BLUE CROSS/BLUE SHIELD

## 2018-05-06 DIAGNOSIS — C3490 Malignant neoplasm of unspecified part of unspecified bronchus or lung: Secondary | ICD-10-CM

## 2018-05-06 NOTE — Progress Notes (Deleted)
[facility] day:  05/06/2018  Chief Complaint: Kristina Wright is a 63 y.o. female with stage IV adenocarcinoma of the lung on osimertinib who is seen for new patient assessment.  HPI:  The patient has a 30 pack year smoking history.  She presented in 2019 with intermittent shortness of breath and chest pain. CXR showed scattered nodular appearing densities in the bases.  Chest CT 01/19/2018 revealed a 8.7 x 5.8 mm slightly irregular pulmonary nodule within the anterior left lower lobe.  There are no other concerning pulmonary nodules.  There was a focal 3.1 mm x 9.2 mm pleural nodule at the right middle lobe minor fissure felt likely a benign linear scar or lymphoid tissue of the pleura.   There was mediastinal adenopathy including a 1.7 x 3.7 cm precarinal lymph node mass and a 1.7 cm right hilar lymph node.  PET scan on 02/09/2018 revealed an unusual pattern of nodal metastasis with intensely hypermetabolic enlarged precarinal lymph node, RIGHT hilar lymph node and RIGHT axillary lymph node. Recommend biopsy of the RIGHT axial lymph node.  There was no metabolic activity associated with the RIGHT lobe pulmonary nodule.  There was no additional evidence primary or metastatic carcinoma.  Clinical stage [id number].  Diagnostic mammogram and ultrasound on 02/10/2018 revealed no suspicious nodule.  There was a right breast nodule on PET scan , no hypermetabolic activity.  Right axillary biopsy on 02/24/2018 revealed metastatic adenocarcinoma c/w lung primary.  Tumor cells were positive for CK7 and TTF-1 with minimal blush reactivity for CK20.  Tumor cells were  Negative for GATA3, ER, and PR.    Omniseq testing revealed EGFR c.2582T>A (L861Q) mutation and PDL-1 60% TPS.  Head MRI on 03/10/2018 revealed no evidence of metastatic disease.  She began osimertinib on 03/25/2018.  She was last seen in the medical oncology clinic by Dr. Tasia Catchings on 03/25/2018.  At that  time, she noted fatigue and shortness of breath.  She was tolerating osimertinib well.  She has a history of cervical cancer in 1989.    Past Medical History:  Diagnosis Date  . Cancer ([place]) 1989   cervical  . Depression   . Hyperlipidemia   . Hypertension   . Malignant neoplasm of lung ([place]) 03/01/2018  . Thyroid disease     Past Surgical History:  Procedure Laterality Date  . BILATERAL CARPAL TUNNEL RELEASE    . BREAST CYST ASPIRATION Left   . LUNG BIOPSY    . PARTIAL HYSTERECTOMY    . PORTA CATH INSERTION N/A 03/03/2018   Procedure: PORTA CATH INSERTION;  Surgeon: Katha Cabal, MD;  Location: [facility];  Service: Cardiovascular;  Laterality: N/A;  . TUBAL LIGATION      Family History  Problem Relation Age of Onset  . Colon cancer Mother 20  . Colon cancer Maternal Grandmother 78  . Hypertension Father   . Breast cancer Sister 84  . Throat cancer Brother   . Heart attack Maternal Aunt   . Ovarian cancer Maternal Aunt   . Stomach cancer Paternal Grandmother   . Throat cancer Brother     Social History:  reports that she quit smoking about 5 months ago. Her smoking use included cigarettes. She has a 25.00 pack-year smoking history. She has never used smokeless tobacco. She reports current drug use. Drug: Marijuana. She reports that she does not drink alcohol.  The patient is accompanied by *** alone today.  Allergies: No  Known Allergies  Current Medications: Current Outpatient Medications  Medication Sig Dispense Refill  . albuterol (PROVENTIL HFA;VENTOLIN HFA) 108 (90 Base) MCG/ACT inhaler Inhale 2 puffs into the lungs every 6 (six) hours as needed for wheezing or shortness of breath. 1 Inhaler 0  . ALPRAZolam (XANAX) 0.25 MG tablet Take 1 tablet (0.25 mg total) by mouth 2 (two) times daily as needed for anxiety. 10 tablet 0  . aspirin EC 81 MG tablet Take 81 mg by mouth daily.    Marland Kitchen atorvastatin (LIPITOR) 10 MG tablet Take 1 tablet (10 mg total) by  mouth daily. 90 tablet 1  . busPIRone (BUSPAR) 5 MG tablet Take 1 tablet (5 mg total) by mouth daily. 90 tablet 1  . clopidogrel (PLAVIX) 75 MG tablet Take 1 tablet (75 mg total) by mouth daily. 90 tablet 1  . FLUoxetine (PROZAC) 10 MG capsule Take 1 capsule (10 mg total) by mouth daily. 90 capsule 1  . folic acid (FOLVITE) 1 MG tablet Take 1 tablet (1 mg total) by mouth daily. Start 7-14 days before [place] chemotherapy. (Patient not taking: Reported on 04/27/2018) 100 tablet 3  . levothyroxine (SYNTHROID, LEVOTHROID) 75 MCG tablet Take 1 tablet (75 mcg total) by mouth daily. 90 tablet 1  . lidocaine-prilocaine (EMLA) cream Apply to affected area once 30 g 3  . lisinopril-hydrochlorothiazide (PRINZIDE,ZESTORETIC) 10-12.5 MG tablet Take 1 tablet by mouth daily. 90 tablet 1  . ondansetron (ZOFRAN) 8 MG tablet Take 1 tablet (8 mg total) by mouth 2 (two) times daily as needed (Nausea or vomiting). Start if needed on the third day after chemotherapy. 30 tablet 1  . osimertinib mesylate (TAGRISSO) 80 MG tablet Take 1 tablet (80 mg total) by mouth daily. 30 tablet 2  . prochlorperazine (COMPAZINE) 10 MG tablet Take 1 tablet (10 mg total) by mouth every 6 (six) hours as needed (Nausea or vomiting). 30 tablet 1   No current facility-administered medications for this visit.     Review of Systems:  GENERAL:  Feels good.  Active.  No fevers, sweats or weight loss. PERFORMANCE STATUS (ECOG):  *** HEENT:  No visual changes, runny nose, sore throat, mouth sores or tenderness. Lungs: No shortness of breath or cough.  No hemoptysis. Cardiac:  No chest pain, palpitations, orthopnea, or PND. GI:  No nausea, vomiting, diarrhea, constipation, melena or hematochezia. GU:  No urgency, frequency, dysuria, or hematuria. Musculoskeletal:  No back pain.  No joint pain.  No muscle tenderness. Extremities:  No pain or swelling. Skin:  No rashes or skin changes. Neuro:  No headache, numbness or weakness, balance or  coordination issues. Endocrine:  No diabetes, thyroid issues, hot flashes or night sweats. Psych:  No mood changes, depression or anxiety. Pain:  No focal pain. Review of systems:  All other systems reviewed and found to be negative.  Physical Exam: There were no vitals taken for this visit. GENERAL:  Well developed, well nourished, **man sitting comfortably in the exam room in no acute distress. MENTAL STATUS:  Alert and oriented to person, place and time. HEAD:  *** hair.  Normocephalic, atraumatic, face symmetric, no Cushingoid features. EYES:  *** eyes.  Pupils equal round and reactive to light and accomodation.  No conjunctivitis or scleral icterus. ENT:  Oropharynx clear without lesion.  Tongue normal. Mucous membranes moist.  RESPIRATORY:  Clear to auscultation without rales, wheezes or rhonchi. CARDIOVASCULAR:  Regular rate and rhythm without murmur, rub or gallop. ABDOMEN:  Soft, non-tender, with active bowel sounds,  and no hepatosplenomegaly.  No masses. SKIN:  No rashes, ulcers or lesions. EXTREMITIES: No edema, no skin discoloration or tenderness.  No palpable cords. LYMPH NODES: No palpable cervical, supraclavicular, axillary or inguinal adenopathy  NEUROLOGICAL: Unremarkable. PSYCH:  Appropriate.   No visits with results within 3 Day(s) from this visit.  Latest known visit with results is:  Appointment on 04/08/2018  Component Date Value Ref Range Status  . Sodium 04/08/2018 137  135 - 145 mmol/L Final  . Potassium 04/08/2018 4.2  3.5 - 5.1 mmol/L Final  . Chloride 04/08/2018 101  98 - 111 mmol/L Final  . CO2 04/08/2018 29  22 - 32 mmol/L Final  . Glucose, Bld 04/08/2018 97  70 - 99 mg/dL Final  . BUN 04/08/2018 12  6 - 20 mg/dL Final  . Creatinine, Ser 04/08/2018 0.92  0.44 - 1.00 mg/dL Final  . Calcium 04/08/2018 9.0  8.9 - 10.3 mg/dL Final  . Total Protein 04/08/2018 7.8  6.5 - 8.1 g/dL Final  . Albumin 04/08/2018 4.0  3.5 - 5.0 g/dL Final  . AST 04/08/2018 22   15 - 41 U/L Final  . ALT 04/08/2018 20  0 - 44 U/L Final  . Alkaline Phosphatase 04/08/2018 79  38 - 126 U/L Final  . Total Bilirubin 04/08/2018 0.7  0.3 - 1.2 mg/dL Final  . GFR calc non Af Amer 04/08/2018 >60  >60 mL/min Final  . GFR calc Af Amer 04/08/2018 >60  >60 mL/min Final  . Anion gap 04/08/2018 7  5 - 15 Final   Performed at [facility], [address], [place], [place] [address]  . WBC 04/08/2018 6.5  4.0 - 10.5 K/uL Final  . RBC 04/08/2018 4.65  3.87 - 5.11 MIL/uL Final  . Hemoglobin 04/08/2018 14.4  12.0 - 15.0 g/dL Final  . HCT 04/08/2018 43.8  36.0 - 46.0 % Final  . MCV 04/08/2018 94.2  80.0 - 100.0 fL Final  . MCH 04/08/2018 31.0  26.0 - 34.0 pg Final  . MCHC 04/08/2018 32.9  30.0 - 36.0 g/dL Final  . RDW 04/08/2018 12.5  11.5 - 15.5 % Final  . Platelets 04/08/2018 249  150 - 400 K/uL Final  . nRBC 04/08/2018 0.0  0.0 - 0.2 % Final  . Neutrophils Relative % 04/08/2018 56  % Final  . Neutro Abs 04/08/2018 3.6  1.7 - 7.7 K/uL Final  . Lymphocytes Relative 04/08/2018 34  % Final  . Lymphs Abs 04/08/2018 2.2  0.7 - 4.0 K/uL Final  . Monocytes Relative 04/08/2018 6  % Final  . Monocytes Absolute 04/08/2018 0.4  0.1 - 1.0 K/uL Final  . Eosinophils Relative 04/08/2018 3  % Final  . Eosinophils Absolute 04/08/2018 0.2  0.0 - 0.5 K/uL Final  . Basophils Relative 04/08/2018 1  % Final  . Basophils Absolute 04/08/2018 0.1  0.0 - 0.1 K/uL Final  . Immature Granulocytes 04/08/2018 0  % Final  . Abs Immature Granulocytes 04/08/2018 0.01  0.00 - 0.07 K/uL Final   Performed at [facility], [address] [place], [place] [address]    Assessment:  MYAH GUYNES is a 63 y.o. female with stage IV adenocarcinoma of the lung s/p right axillary biopsy on 02/24/2018.  Pathology revealed metastatic adenocarcinoma c/w lung primary.  Tumor cells were positive for CK7 and TTF-1 with minimal blush reactivity for CK20.  Tumor cells were negative for GATA3, ER, and PR.   Omniseq testing revealed EGFR  c.2582>A (L861Q) mutation and PDL-1 60% TPS.  Chest CT 01/19/2018 revealed a 8.7 x 5.8 mm slightly irregular pulmonary nodule within the anterior left lower lobe.  There are no other concerning pulmonary nodules.  There was a focal 3.1 mm x 9.2 mm pleural nodule at the right middle lobe minor fissure felt likely a benign linear scar or lymphoid tissue of the pleura.   There was mediastinal adenopathy including a 1.7 x 3.7 cm precarinal lymph node mass and a 1.7 cm right hilar lymph node.  PET scan on 02/09/2018 revealed an unusual pattern of nodal metastasis with intensely hypermetabolic enlarged precarinal lymph node, RIGHT hilar lymph node and RIGHT axillary lymph node. Recommend biopsy of the RIGHT axial lymph node.  There was no metabolic activity associated with the RIGHT lobe pulmonary nodule.  There was no additional evidence primary or metastatic carcinoma.  Clinical stage [id number].  Head MRI on 03/10/2018 revealed no evidence of metastatic disease.  She began osimertinib on 03/25/2018.  Symptomatically,   Plan: 1.  Labs today:  CBC with diff, CMP, Mg, CEA. 2.  Stage IV adenocarcinoma of the Lung (EGFR L861Q mutation):  EKG- assess QT 3.   4.    Lequita Asal, MD  05/06/2018, 3:30 AM   I saw and evaluated the patient, participating in the key portions of the service and reviewing pertinent diagnostic studies and records.  I reviewed the nurse practitioner's note and agree with the findings and the plan.  The assessment and plan were discussed with the patient.  Additional diagnostic studies of *** are needed to clarify *** and would change the clinical management.  A few ***multiple questions were asked by the patient and answered.   Nolon Stalls, MD 05/06/2018,3:30 AM

## 2018-05-12 ENCOUNTER — Other Ambulatory Visit: Payer: BLUE CROSS/BLUE SHIELD

## 2018-05-12 ENCOUNTER — Ambulatory Visit: Payer: BLUE CROSS/BLUE SHIELD | Admitting: Hematology and Oncology

## 2018-05-15 ENCOUNTER — Other Ambulatory Visit: Payer: Self-pay | Admitting: Hematology and Oncology

## 2018-05-15 NOTE — Progress Notes (Signed)
[facility] day:  05/17/2018  Chief Complaint: Kristina Wright is a 63 y.o. female with stage IV adenocarcinoma of the lung on osimertinib who is seen for new patient assessment.  HPI:  The patient has a 30 pack year smoking history.  She presented in 2019 with intermittent shortness of breath and chest pain. CXR showed scattered nodular appearing densities in the bases.  Chest CT on 01/19/2018 revealed a 8.7 x 5.8 mm slightly irregular pulmonary nodule within the anterior left lower lobe.  There are no other concerning pulmonary nodules.  There was a focal 3.1 mm x 9.2 mm pleural nodule at the right middle lobe minor fissure felt likely a benign linear scar or lymphoid tissue of the pleura.   There was mediastinal adenopathy including a 1.7 x 3.7 cm precarinal lymph node mass and a 1.7 cm right hilar lymph node.  PET scan on 02/09/2018 revealed an unusual pattern of nodal metastasis with intensely hypermetabolic enlarged precarinal lymph node, RIGHT hilar lymph node and RIGHT axillary lymph node.  Recommend biopsy of the RIGHT axial lymph node.  There was no metabolic activity associated with the RIGHT lobe pulmonary nodule.  There was no additional evidence primary or metastatic carcinoma.  Clinical stage [id number].  Diagnostic mammogram and ultrasound on 02/10/2018 revealed no suspicious nodule.  There was a right breast nodule on PET scan , no hypermetabolic activity.  Right axillary biopsy on 02/24/2018 revealed metastatic adenocarcinoma c/w lung primary.  Tumor cells were positive for CK7 and TTF-1 with minimal blush reactivity for CK20.  Tumor cells were  Negative for GATA3, ER, and PR.    Omniseq testing revealed EGFR c.2582T>A (L861Q) mutation and PDL-1 60% TPS.  Head MRI on 03/10/2018 revealed no evidence of metastatic disease.  She began osimertinib on 03/24/2018.  She was last seen in the medical oncology clinic by Dr. Tasia Catchings on 03/25/2018.  At that  time, she noted fatigue and shortness of breath.  She was tolerating osimertinib well.  She has a history of cervical cancer in 1989.  Underwent cryotherapy for her cervical cancer.  She recently declined a Pap smear.  Symptomatically, patient has been on osimertinib for 8 weeks at this point.  Baseline ECG on 01/07/2018 revealed NSR without ectopy; QTc of 423 msec. She initially had some marked fatigue, headaches, and visual changes. Theses symptoms have since resolved. Patient with loose stools and nausea, which are both controlled with the prescribed interventions. Patient notes that she is more nervous and paranoid since starting this medication.   Patient denies that she has experienced any B symptoms. She denies any interval infections. She denies any increased shortness of breath, chest pain, or episodes of palpitations. Oxygen saturation is 100% on room air today.    Patient advises that she maintains an adequate appetite. She is eating well. Weight today is 187 lb 4.5 oz (84.9 kg), which compared to her last visit to the clinic, represents a 3 pound increase.   Patient denies pain in the clinic today.   Past Medical History:  Diagnosis Date  . Cancer ([facility]) 1989   cervical  . Depression   . Hyperlipidemia   . Hypertension   . Malignant neoplasm of lung ([facility]) 03/01/2018  . Thyroid disease     Past Surgical History:  Procedure Laterality Date  . BILATERAL CARPAL TUNNEL RELEASE    . BREAST CYST ASPIRATION Left   . LUNG BIOPSY    . PARTIAL HYSTERECTOMY    .  PORTA CATH INSERTION N/A 03/03/2018   Procedure: PORTA CATH INSERTION;  Surgeon: Katha Cabal, MD;  Location: [facility];  Service: Cardiovascular;  Laterality: N/A;  . TUBAL LIGATION      Family History  Problem Relation Age of Onset  . Colon cancer Mother 29  . Colon cancer Maternal Grandmother 78  . Hypertension Father   . Breast cancer Sister 18  . Throat cancer Brother   . Heart attack Maternal  Aunt   . Ovarian cancer Maternal Aunt   . Stomach cancer Paternal Grandmother   . Throat cancer Brother     Social History:  reports that she quit smoking about 5 months ago. Her smoking use included cigarettes. She has a 25.00 pack-year smoking history. She has never used smokeless tobacco. She reports current drug use. Drug: Marijuana. She reports that she does not drink alcohol.  She has a [employer].  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 The patient is accompanied by her ex-husband today.  Allergies: No Known Allergies  Current Medications: Current Outpatient Medications  Medication Sig Dispense Refill  . albuterol (PROVENTIL HFA;VENTOLIN HFA) 108 (90 Base) MCG/ACT inhaler Inhale 2 puffs into the lungs every 6 (six) hours as needed for wheezing or shortness of breath. 1 Inhaler 0  . ALPRAZolam (XANAX) 0.25 MG tablet Take 1 tablet (0.25 mg total) by mouth 2 (two) times daily as needed for anxiety. 10 tablet 0  . aspirin EC 81 MG tablet Take 81 mg by mouth daily.    Marland Kitchen atorvastatin (LIPITOR) 10 MG tablet Take 1 tablet (10 mg total) by mouth daily. 90 tablet 1  . busPIRone (BUSPAR) 5 MG tablet Take 1 tablet (5 mg total) by mouth daily. 90 tablet 1  . clopidogrel (PLAVIX) 75 MG tablet Take 1 tablet (75 mg total) by mouth daily. 90 tablet 1  . FLUoxetine (PROZAC) 10 MG capsule Take 1 capsule (10 mg total) by mouth daily. 90 capsule 1  . levothyroxine (SYNTHROID, LEVOTHROID) 75 MCG tablet Take 1 tablet (75 mcg total) by mouth daily. 90 tablet 1  . lidocaine-prilocaine (EMLA) cream Apply to affected area once 30 g 3  . lisinopril-hydrochlorothiazide (PRINZIDE,ZESTORETIC) 10-12.5 MG tablet Take 1 tablet by mouth daily. 90 tablet 1  . ondansetron (ZOFRAN) 8 MG tablet Take 1 tablet (8 mg total) by mouth 2 (two) times daily as needed (Nausea or vomiting). Start if needed on the third day after chemotherapy. 30 tablet 1  . osimertinib  mesylate (TAGRISSO) 80 MG tablet Take 1 tablet (80 mg total) by mouth daily. 30 tablet 2  . prochlorperazine (COMPAZINE) 10 MG tablet Take 1 tablet (10 mg total) by mouth every 6 (six) hours as needed (Nausea or vomiting). 30 tablet 1  . Multiple Vitamins-Minerals (MULTIVITAMIN ADULT PO) Take 1 tablet by mouth daily.      No current facility-administered medications for this visit.    Facility-Administered Medications Ordered in Other Visits  Medication Dose Route Frequency Provider Last Rate Last Dose  . sodium chloride flush (NS) 0.9 % injection 10 mL  10 mL Intravenous PRN Lequita Asal, MD   10 mL at 05/17/18 1050    Review of Systems:  GENERAL:  Feels "pretty good".  No fevers, sweats.  Three pound weight gain. PERFORMANCE STATUS (ECOG):  1 HEENT:  No visual changes, runny nose, sore throat, mouth sores or tenderness. Lungs: No shortness of breath or cough.  No hemoptysis. Cardiac:  No chest pain, palpitations, orthopnea, or PND. GI:  Loose stools.  Nausea.  No vomiting, constipation, melena or hematochezia. GU:  No urgency, frequency, dysuria, or hematuria. h/o cervical cancer. Musculoskeletal:  No back pain.  No joint pain.  No muscle tenderness. Extremities:  No pain or swelling. Skin:  No rashes or skin changes. Neuro:  No headache, numbness or weakness, balance or coordination issues. Endocrine:  No diabetes.  Thyroid disease on Synthroid.  No hot flashes or night sweats. Psych:  Nervous/anxious.  No depression. Pain:  No focal pain. Review of systems:  All other systems reviewed and found to be negative.  Physical Exam: Blood pressure (!) 147/85, pulse 71, temperature 97.8 F (36.6 C), temperature source Oral, resp. rate 18, weight 187 lb 4.5 oz (84.9 kg), SpO2 100 %. GENERAL:  Well developed, well nourished, woman sitting comfortably in the exam room in no acute distress. MENTAL STATUS:  Alert and oriented to person, place and time. HEAD:  Dark styled hair.   Normocephalic, atraumatic, face symmetric, no Cushingoid features. EYES:  Hazel eyes.  Pupils equal round and reactive to light and accomodation.  No conjunctivitis or scleral icterus. ENT:  Oropharynx clear without lesion.  Tongue normal. Mucous membranes moist.  RESPIRATORY:  Clear to auscultation without rales, wheezes or rhonchi. CARDIOVASCULAR:  Regular rate and rhythm without murmur, rub or gallop. ABDOMEN:  Soft, non-tender, with active bowel sounds, and no hepatosplenomegaly.  No masses. SKIN:  Left hand bruising.  No rashes, ulcers or lesions. EXTREMITIES: No edema, no skin discoloration or tenderness.  No palpable cords. LYMPH NODES: No palpable cervical, supraclavicular, axillary or inguinal adenopathy  NEUROLOGICAL: Unremarkable. PSYCH:  Appropriate.   Infusion on 05/17/2018  Component Date Value Ref Range Status  . Magnesium 05/17/2018 2.3  1.7 - 2.4 mg/dL Final   Performed at [facility], [address] [place], [place] [address]  . Sodium 05/17/2018 136  135 - 145 mmol/L Final  . Potassium 05/17/2018 3.5  3.5 - 5.1 mmol/L Final  . Chloride 05/17/2018 101  98 - 111 mmol/L Final  . CO2 05/17/2018 26  22 - 32 mmol/L Final  . Glucose, Bld 05/17/2018 110* 70 - 99 mg/dL Final  . BUN 05/17/2018 13  6 - 20 mg/dL Final  . Creatinine, Ser 05/17/2018 0.74  0.44 - 1.00 mg/dL Final  . Calcium 05/17/2018 8.9  8.9 - 10.3 mg/dL Final  . Total Protein 05/17/2018 7.3  6.5 - 8.1 g/dL Final  . Albumin 05/17/2018 4.1  3.5 - 5.0 g/dL Final  . AST 05/17/2018 27  15 - 41 U/L Final  . ALT 05/17/2018 27  0 - 44 U/L Final  . Alkaline Phosphatase 05/17/2018 65  38 - 126 U/L Final  . Total Bilirubin 05/17/2018 0.5  0.3 - 1.2 mg/dL Final  . GFR calc non Af Amer 05/17/2018 >60  >60 mL/min Final  . GFR calc Af Amer 05/17/2018 >60  >60 mL/min Final  . Anion gap 05/17/2018 9  5 - 15 Final   Performed at [facility], [address] [place],  [address]  . WBC  05/17/2018 7.1  4.0 - 10.5 K/uL Final  . RBC 05/17/2018 4.52  3.87 - 5.11 MIL/uL Final  . Hemoglobin 05/17/2018 13.9  12.0 - 15.0 g/dL Final  . HCT 05/17/2018 42.1  36.0 - 46.0 % Final  . MCV 05/17/2018 93.1  80.0 - 100.0 fL Final  . MCH 05/17/2018 30.8  26.0 - 34.0 pg  Final  . MCHC 05/17/2018 33.0  30.0 - 36.0 g/dL Final  . RDW 05/17/2018 12.6  11.5 - 15.5 % Final  . Platelets 05/17/2018 259  150 - 400 K/uL Final  . nRBC 05/17/2018 0.0  0.0 - 0.2 % Final  . Neutrophils Relative % 05/17/2018 55  % Final  . Neutro Abs 05/17/2018 3.9  1.7 - 7.7 K/uL Final  . Lymphocytes Relative 05/17/2018 36  % Final  . Lymphs Abs 05/17/2018 2.5  0.7 - 4.0 K/uL Final  . Monocytes Relative 05/17/2018 6  % Final  . Monocytes Absolute 05/17/2018 0.5  0.1 - 1.0 K/uL Final  . Eosinophils Relative 05/17/2018 2  % Final  . Eosinophils Absolute 05/17/2018 0.2  0.0 - 0.5 K/uL Final  . Basophils Relative 05/17/2018 1  % Final  . Basophils Absolute 05/17/2018 0.0  0.0 - 0.1 K/uL Final  . Immature Granulocytes 05/17/2018 0  % Final  . Abs Immature Granulocytes 05/17/2018 0.02  0.00 - 0.07 K/uL Final   Performed at [facility], [address] [place], [place] [address]    Assessment:  Kristina Wright is a 63 y.o. female with stage IV adenocarcinoma of the lung s/p right axillary biopsy on 02/24/2018.  Pathology revealed metastatic adenocarcinoma c/w lung primary.  Tumor cells were positive for CK7 and TTF-1 with minimal blush reactivity for CK20.  Tumor cells were negative for GATA3, ER, and PR.  Omniseq testing revealed EGFR c.2582>A (L861Q) mutation and PDL-1 60% TPS.  Chest CT 01/19/2018 revealed a 8.7 x 5.8 mm slightly irregular pulmonary nodule within the anterior left lower lobe.  There are no other concerning pulmonary nodules.  There was a focal 3.1 mm x 9.2 mm pleural nodule at the right middle lobe minor fissure felt likely a benign linear scar or lymphoid tissue of the pleura.   There was  mediastinal adenopathy including a 1.7 x 3.7 cm precarinal lymph node mass and a 1.7 cm right hilar lymph node.  PET scan on 02/09/2018 revealed an unusual pattern of nodal metastasis with intensely hypermetabolic enlarged precarinal lymph node, RIGHT hilar lymph node and RIGHT axillary lymph node. Recommend biopsy of the RIGHT axial lymph node.  There was no metabolic activity associated with the RIGHT lobe pulmonary nodule.  There was no additional evidence primary or metastatic carcinoma.  Clinical stage [id number].  Head MRI on 03/10/2018 revealed no evidence of metastatic disease.  She began osimertinib on 03/24/2018.  She has a history of cervical cancer in 1989.  She underwent cryotherapy.  Symptomatically, she feels "pretty good".  She has a little nausea and loose stools.  Exam is unremarkable.  Plan: 1.   Labs today:  CBC with diff, CMP, Mg, CEA. 2.   Stage IV adenocarcinoma of the Lung (EGFR L861Q mutation)  Discuss diagnosis, staging, and management of lung cancer.  Discuss EGFR mutation and treatment with osimertinib.  Discuss potential side effects.  EKG- assess QTc  Discuss plan for follow-up chest CT in 1 month. 3.   Cervical cancer  Distant history  Encourage follow-up GYN exam. 4.   Port flush every 6-8 weeks. 5.   RTC after interval imagine for MD assessment, labs (CBC with diff, CMP), and review of CT scan.     Honor Loh, NP  05/17/2018, 12:01 PM   I saw and evaluated the patient, participating in the key portions of the service and reviewing pertinent diagnostic studies and records.  I reviewed the nurse practitioner's note  and agree with the findings and the plan.  The assessment and plan were discussed with the patient.  Multiple questions were asked by the patient and answered.   Nolon Stalls, MD 05/17/2018,12:01 PM

## 2018-05-17 ENCOUNTER — Inpatient Hospital Stay: Payer: BLUE CROSS/BLUE SHIELD

## 2018-05-17 ENCOUNTER — Other Ambulatory Visit: Payer: Self-pay

## 2018-05-17 ENCOUNTER — Inpatient Hospital Stay: Payer: BLUE CROSS/BLUE SHIELD | Attending: Hematology and Oncology | Admitting: Hematology and Oncology

## 2018-05-17 ENCOUNTER — Encounter: Payer: Self-pay | Admitting: Hematology and Oncology

## 2018-05-17 VITALS — BP 147/85 | HR 71 | Temp 97.8°F | Resp 18 | Wt 187.3 lb

## 2018-05-17 DIAGNOSIS — Z452 Encounter for adjustment and management of vascular access device: Secondary | ICD-10-CM | POA: Diagnosis not present

## 2018-05-17 DIAGNOSIS — C3490 Malignant neoplasm of unspecified part of unspecified bronchus or lung: Secondary | ICD-10-CM

## 2018-05-17 DIAGNOSIS — Z7189 Other specified counseling: Secondary | ICD-10-CM

## 2018-05-17 DIAGNOSIS — C773 Secondary and unspecified malignant neoplasm of axilla and upper limb lymph nodes: Secondary | ICD-10-CM | POA: Insufficient documentation

## 2018-05-17 DIAGNOSIS — C3432 Malignant neoplasm of lower lobe, left bronchus or lung: Secondary | ICD-10-CM | POA: Diagnosis present

## 2018-05-17 DIAGNOSIS — Z95828 Presence of other vascular implants and grafts: Secondary | ICD-10-CM

## 2018-05-17 DIAGNOSIS — Z9189 Other specified personal risk factors, not elsewhere classified: Secondary | ICD-10-CM

## 2018-05-17 DIAGNOSIS — R7989 Other specified abnormal findings of blood chemistry: Secondary | ICD-10-CM

## 2018-05-17 DIAGNOSIS — Z5181 Encounter for therapeutic drug level monitoring: Secondary | ICD-10-CM

## 2018-05-17 LAB — CBC WITH DIFFERENTIAL/PLATELET
Abs Immature Granulocytes: 0.02 10*3/uL (ref 0.00–0.07)
Basophils Absolute: 0 10*3/uL (ref 0.0–0.1)
Basophils Relative: 1 %
Eosinophils Absolute: 0.2 10*3/uL (ref 0.0–0.5)
Eosinophils Relative: 2 %
HCT: 42.1 % (ref 36.0–46.0)
Hemoglobin: 13.9 g/dL (ref 12.0–15.0)
Immature Granulocytes: 0 %
Lymphocytes Relative: 36 %
Lymphs Abs: 2.5 10*3/uL (ref 0.7–4.0)
MCH: 30.8 pg (ref 26.0–34.0)
MCHC: 33 g/dL (ref 30.0–36.0)
MCV: 93.1 fL (ref 80.0–100.0)
Monocytes Absolute: 0.5 10*3/uL (ref 0.1–1.0)
Monocytes Relative: 6 %
Neutro Abs: 3.9 10*3/uL (ref 1.7–7.7)
Neutrophils Relative %: 55 %
Platelets: 259 10*3/uL (ref 150–400)
RBC: 4.52 MIL/uL (ref 3.87–5.11)
RDW: 12.6 % (ref 11.5–15.5)
WBC: 7.1 10*3/uL (ref 4.0–10.5)
nRBC: 0 % (ref 0.0–0.2)

## 2018-05-17 LAB — COMPREHENSIVE METABOLIC PANEL
ALT: 27 U/L (ref 0–44)
AST: 27 U/L (ref 15–41)
Albumin: 4.1 g/dL (ref 3.5–5.0)
Alkaline Phosphatase: 65 U/L (ref 38–126)
Anion gap: 9 (ref 5–15)
BUN: 13 mg/dL (ref 6–20)
CO2: 26 mmol/L (ref 22–32)
Calcium: 8.9 mg/dL (ref 8.9–10.3)
Chloride: 101 mmol/L (ref 98–111)
Creatinine, Ser: 0.74 mg/dL (ref 0.44–1.00)
GFR calc Af Amer: >60 mL/min (ref >60)
GFR calc non Af Amer: >60 mL/min (ref >60)
Glucose, Bld: 110 mg/dL — ABNORMAL HIGH (ref 70–99)
Potassium: 3.5 mmol/L (ref 3.5–5.1)
Sodium: 136 mmol/L (ref 135–145)
Total Bilirubin: 0.5 mg/dL (ref 0.3–1.2)
Total Protein: 7.3 g/dL (ref 6.5–8.1)

## 2018-05-17 LAB — MAGNESIUM: Magnesium: 2.3 mg/dL (ref 1.7–2.4)

## 2018-05-17 LAB — TSH: TSH: 4.647 u[IU]/mL — ABNORMAL HIGH (ref 0.350–4.500)

## 2018-05-17 LAB — T4, FREE: Free T4: 0.82 ng/dL (ref 0.82–1.77)

## 2018-05-17 MED ORDER — HEPARIN SOD (PORK) LOCK FLUSH 100 UNIT/ML IV SOLN
500.0000 [IU] | Freq: Once | INTRAVENOUS | Status: AC
  Administered 2018-05-17: 500 [IU] via INTRAVENOUS
  Filled 2018-05-17: qty 5

## 2018-05-17 MED ORDER — SODIUM CHLORIDE 0.9% FLUSH
10.0000 mL | INTRAVENOUS | Status: DC | PRN
  Administered 2018-05-17: 10 mL via INTRAVENOUS
  Filled 2018-05-17: qty 10

## 2018-05-17 NOTE — Progress Notes (Signed)
Pt here for follow up. Previous Dr. Tasia Catchings patient. Denies any concerns at this time.

## 2018-05-18 LAB — CEA: CEA: 2.9 ng/mL (ref 0.0–4.7)

## 2018-05-19 MED FILL — TAGRISSO 80 MG TABLET: 80 | 15 days supply | Qty: 15 | Fill #4

## 2018-05-20 ENCOUNTER — Inpatient Hospital Stay: Payer: BLUE CROSS/BLUE SHIELD | Attending: Hematology and Oncology | Admitting: Genetics

## 2018-05-20 ENCOUNTER — Inpatient Hospital Stay: Payer: BLUE CROSS/BLUE SHIELD

## 2018-05-20 ENCOUNTER — Encounter: Payer: Self-pay | Admitting: Genetics

## 2018-05-20 DIAGNOSIS — Z7183 Encounter for nonprocreative genetic counseling: Secondary | ICD-10-CM

## 2018-05-20 DIAGNOSIS — C7800 Secondary malignant neoplasm of unspecified lung: Secondary | ICD-10-CM | POA: Diagnosis not present

## 2018-05-20 DIAGNOSIS — Z808 Family history of malignant neoplasm of other organs or systems: Secondary | ICD-10-CM

## 2018-05-20 DIAGNOSIS — Z803 Family history of malignant neoplasm of breast: Secondary | ICD-10-CM

## 2018-05-20 DIAGNOSIS — C801 Malignant (primary) neoplasm, unspecified: Secondary | ICD-10-CM | POA: Diagnosis not present

## 2018-05-20 DIAGNOSIS — Z8041 Family history of malignant neoplasm of ovary: Secondary | ICD-10-CM | POA: Insufficient documentation

## 2018-05-20 DIAGNOSIS — Z800 Family history of malignant neoplasm of digestive organs: Secondary | ICD-10-CM

## 2018-05-20 NOTE — Progress Notes (Addendum)
REFERRING PROVIDER: Earlie Server, MD [place], [place] [address]  PRIMARY PROVIDER:  Juline Patch, MD  PRIMARY REASON FOR VISIT:  1. Family history of thyroid cancer   2. Family history of breast cancer   3. Family history of colon cancer   4. Family history of ovarian cancer   5. Family history of stomach cancer   6. Family history of throat cancer   7. Metastatic adenocarcinoma to lung with unknown primary site, unspecified laterality ([place])     HISTORY OF PRESENT ILLNESS:   Kristina Wright, a 63 y.o. female, was seen for a [facility] cancer genetics consultation at the request of Dr. Tasia Catchings due to a personal and family history of cancer.  Kristina Wright presents to clinic today to discuss the possibility of a hereditary predisposition to cancer, genetic testing, and to further clarify her future cancer risks, as well as potential cancer risks for family members.   At the age of 89, Kristina Wright was diagnosed with stage IV lung cancer. 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   She reports having abnormal cells frozen on her cervix in her late 20's.   CANCER HISTORY:    Malignant neoplasm of lung ([place])   03/01/2018 Initial Diagnosis    Malignant neoplasm of lung ([place])    03/01/2018 -  Chemotherapy    The patient had palonosetron (ALOXI) injection 0.25 mg, 0.25 mg, Intravenous,  Once, 0 of 4 cycles PEMEtrexed (ALIMTA) 1,000 mg in sodium chloride 0.9 % 100 mL chemo infusion, 500 mg/m2, Intravenous,  Once, 0 of 6 cycles CARBOplatin (PARAPLATIN) in sodium chloride 0.9 % 100 mL chemo infusion, , Intravenous,  Once, 0 of 4 cycles pembrolizumab (KEYTRUDA) 200 mg in sodium chloride 0.9 % 50 mL chemo infusion, 200 mg, Intravenous, Once, 0 of 6 cycles fosaprepitant (EMEND) 150 mg, dexamethasone (DECADRON) 12 mg in sodium chloride 0.9 % 145 mL IVPB, , Intravenous,  Once, 0 of 4 cycles  for chemotherapy treatment.     03/12/2018 Cancer Staging     Staging form: Lung, AJCC 8th Edition - Clinical stage from 03/12/2018: Stage IV (cT1, cN3, pM1) - Signed by Earlie Server, MD on 03/12/2018      HORMONAL RISK FACTORS:  Menarche was at age 67.  Ovaries intact: yes. 1 intact, 1 removed Hysterectomy:  no Menopausal status: postmenopausal.  HRT use: 0 years. Colonoscopy: yes; reports some polyps have been identified, goes every 5 yeras. Mammogram within the last year: yes. Number of breast biopsies: 1. Nov 2019  Past Medical History:  Diagnosis Date  . Cancer ([place]) 1989   cervical  . Depression   . Family history of breast cancer   . Family history of colon cancer   . Family history of ovarian cancer   . Family history of stomach cancer   . Family history of throat cancer   . Hyperlipidemia   . Hypertension   . Malignant neoplasm of lung ([place]) 03/01/2018  . Thyroid disease     Past Surgical History:  Procedure Laterality Date  . BILATERAL CARPAL TUNNEL RELEASE    . BREAST CYST ASPIRATION Left   . LUNG BIOPSY    . PARTIAL HYSTERECTOMY    . PORTA CATH INSERTION N/A 03/03/2018   Procedure: PORTA CATH INSERTION;  Surgeon: Katha Cabal, MD;  Location: [facility];  Service: Cardiovascular;  Laterality: N/A;  . TUBAL LIGATION      Social History  Socioeconomic History  . Marital status: Divorced    Spouse name: Not on file  . Number of children: 3  . Years of education: Not on file  . Highest education level: Not on file  Occupational History  . Not on file  Social Needs  . Financial resource strain: Not on file  . Food insecurity:    Worry: Not on file    Inability: Not on file  . Transportation needs:    Medical: Not on file    Non-medical: Not on file  Tobacco Use  . Smoking status: Former Smoker    Packs/day: 1.00    Years: 25.00    Pack years: 25.00    Types: Cigarettes    Last attempt to quit: 11/19/2017    Years since quitting: 0.4  . Smokeless tobacco: Never Used  Substance and Sexual  Activity  . Alcohol use: No  . Drug use: Yes    Types: Marijuana    Comment: occasional  . Sexual activity: Not on file  Lifestyle  . Physical activity:    Days per week: Not on file    Minutes per session: Not on file  . Stress: Not on file  Relationships  . Social connections:    Talks on phone: Not on file    Gets together: Not on file    Attends religious service: Not on file    Active member of club or organization: Not on file    Attends meetings of clubs or organizations: Not on file    Relationship status: Not on file  Other Topics Concern  . Not on file  Social History Narrative  . Not on file     FAMILY HISTORY:  We obtained a detailed, 4-generation family history.  Significant diagnoses are listed below: Family History  Problem Relation Age of Onset  . Colon cancer Mother 62  . Colon cancer Maternal Grandmother 78  . Hypertension Father   . Breast cancer Sister 36  . Throat cancer Brother   . Heart attack Maternal Aunt   . Ovarian cancer Maternal Aunt 70  . Stomach cancer Paternal Grandmother        dx >50  . Throat cancer Brother     Kristina Wright has 2 sons in their 20's and a daughter who is 53.  She has no grandchildren at this time.   Kristina Wright has 3 sisters and 3 brothers.  -2 brothers had throat cancer -1 sister was dx with breast cancer at 63.   -1 sister had liver disease.  -1 brother is deceased, unk cause -1 sister is 40 with no hx of cancer.   Kristina Wright father: died at 74 with no hx of cancer.  Paternal Aunts/Uncles: 1 paternal aunt and 3 paternal uncles with no known hx of cancer.  Paternal cousins: no known hx of cancer.  Paternal grandfather: unk cause of death, died in late 67's.  Paternal grandmother:stomach cancer dx >50  Kristina Wright's mother: had colon cancer dx late 70's, she did not die from this cancer.  Maternal Aunts/Uncles: 2 maternal aunts and 1 maternal uncle.  The uncle died in war. 1 aunt had ovarian cancer dx in her  22's.  1 aunt died of age related disease.  Maternal cousins: possibly throat cancer in 52 female cousin.  Maternal grandfather: died at 55, no hx of cancer.  Maternal grandmother:died of colon cancer at 75.   Kristina Wright is unaware of previous family history of genetic testing  for hereditary cancer risks. Patient's maternal ancestors are of Native American descent, and paternal ancestors are of [place] descent. There is no reported Ashkenazi Jewish ancestry. There is no known consanguinity.  GENETIC COUNSELING ASSESSMENT: Kristina Wright is a 63 y.o. female with a personal and family history which is somewhat suggestive of a Hereditary Cancer Predisposition Syndrome. We, therefore, discussed and recommended the following at today's visit.   DISCUSSION: We reviewed the characteristics, features and inheritance patterns of hereditary cancer syndromes. We also discussed genetic testing, including the appropriate family members to test, the process of testing, insurance coverage and turn-around-time for results. We discussed the implications of a negative, positive and/or variant of uncertain significant result. We recommended Kristina Wright pursue genetic testing for the Colorectal cancer panel with reflex to the Multi-cancer panel.    [facility] Colorectal Cancer Panel:  APC AXIN2 BMPR1A CDH1 CHEK2 EPCAM GREM1 MLH1 MSH2 MSH3 MSH6 MUTYH NTHL1 PMS2 POLD1 POLE PTEN SMAD4 STK11 TP53  The Multi-Cancer Panel offered by [facility] includes sequencing and/or deletion duplication testing of the following 90 genes: AIP, ALK, APC, ATM, AXIN2, BAP1, BARD1, BLM, BMPR1A, BRCA1, BRCA2, BRIP1, BUB1B, CASR, CDC73, CDH1, CDK4, CDKN1B, CDKN1C, CDKN2A, CEBPA, CHEK2, CTNNA1, DICER1, DIS3L2, EGFR, ENG, EPCAM, FH, FLCN, GALNT12, GATA2, GPC3, GREM1, HOXB13, HRAS, KIT, MAX, MEN1, MET, MITF, MLH1, MLH3, MSH2, MSH3, MSH6, MUTYH, NBN, NF1, NF2, NTHL1, PALB2, PDGFRA, PHOX2B, PMS2, POLD1, POLE, POT1, PRKAR1A, PTCH1, PTEN, RAD50, RAD51C, RAD51D,  RB1, RECQL4, RET, RNF43, RPS20, RUNX1, SDHA, SDHAF2, SDHB, SDHC, SDHD, SMAD4, SMARCA4, SMARCB1, SMARCE1, STK11, SUFU, TERC, TERT, TMEM127, TP53, TSC1, TSC2, VHL, WRN, WT1  We discussed that only 5-10% of cancers are associated with a Hereditary cancer predisposition syndrome.  Given the family history of colon and ovarian cancer, we discussed Lynch Syndrome. This syndrome increases a risk for colon, uterine, ovarian, stomach, pancreatic, urothelial, prostate, brain, and possibly other cancer risks. This is cause by mutations in the genes: MLH1, MSH2, MSH6, PMS2, and EPCAM.   One of the most common hereditary cancer syndromes that increases breast cancer risk is called Hereditary Breast and Ovarian Cancer (HBOC) syndrome.  This syndrome is caused by mutations in the BRCA1 and BRCA2 genes.  This syndrome increases an individual's lifetime risk to develop breast, ovarian, pancreatic, and other types of cancer.  There are also many other cancer predisposition syndromes caused by mutations in several other genes.  Given her lung cancer and the identification of a somatic EGFR mutation, we also discussed briefly hereditary lung cancer associated with EGFR.   We discussed that if she is found to have a mutation in one of these genes, it may impact future medical management recommendations such as increased cancer screenings and consideration of risk reducing surgeries.  A positive result could also have implications for the patient's family members.  A Negative result would mean we were unable to identify a hereditary component to her cancer, but does not Wright out the possibility of a hereditary basis for her cancer.  There could be mutations that are undetectable by current technology, or in genes not yet tested or identified to increase cancer risk.    We discussed the potential to find a Variant of Uncertain Significance or VUS.  These are variants that have not yet been identified as pathogenic or benign,  and it is unknown if this variant is associated with increased cancer risk or if this is a normal finding.  Most VUS's are reclassified to benign or likely benign.   It should  not be used to make medical management decisions. With time, we suspect the lab will determine the significance of any VUS's identified if any.   Based on Kristina Wright's personal and family history of cancer, she meets medical criteria for genetic testing. Despite that she meets criteria, she may still have an out of pocket cost. The laboratory can provide her with an estimate of her OOP cost.  she was given the contact information for the laboratory if she has further questions.   PLAN: After considering the risks, benefits, and limitations, Kristina Wright  provided informed consent to pursue genetic testing and the blood sample was sent to [facility] for analysis of the Colorectal Cancer panel with plans to reflex to the Multi-Cancer Panel. Results should be available within approximately 2-3 weeks' time, at which point they will be disclosed by telephone to Kristina Wright, as will any additional recommendations warranted by these results. Kristina Wright will receive a summary of her genetic counseling visit and a copy of her results once available. This information will also be available in Epic. We encouraged Kristina Wright to remain in contact with cancer genetics annually so that we can continuously update the family history and inform her of any changes in cancer genetics and testing that may be of benefit for her family. Kristina Wright questions were answered to her satisfaction today. Our contact information was provided should additional questions or concerns arise.  Based on Kristina Wright's family history, we recommended her sister and other siblings/maternal relatives also, have genetic counseling and testing. Kristina Wright will let us know if we can be of any assistance in coordinating genetic counseling and/or testing for this family  member.   Lastly, we encouraged Ms. Whitelaw to remain in contact with cancer genetics annually so that we can continuously update the family history and inform her of any changes in cancer genetics and testing that may be of benefit for this family.   Ms.  Lubas questions were answered to her satisfaction today. Our contact information was provided should additional questions or concerns arise. Thank you for the referral and allowing [place] to share in the care of your patient.   Tana Felts, MS, [facility] Certified Genetic Counselor [email or web address] phone: [phone]  The patient was seen for a total of 30 minutes in face-to-face genetic counseling. Dr. Grayland Ormond was available for questions regarding this case.

## 2018-05-21 ENCOUNTER — Encounter: Payer: Self-pay | Admitting: Genetics

## 2018-05-21 NOTE — Progress Notes (Signed)
The patient is applying for financial assistance through the genetic testing lab.  She stated she will bring a copy of her W2 or 1090 tax form to submit (to the [facility]).   My physical location is a [place] long.  If the patient brings in this form, it can be scanned in and emailed to me at [email or web address] or it can be faxed directly to me at [phone].   If any questions please feel free to reach out- my phone number is [phone].   Ferol Luz, MS, [facility] Certified Genetic Counselor

## 2018-05-29 ENCOUNTER — Other Ambulatory Visit: Payer: Self-pay | Admitting: Family Medicine

## 2018-05-29 DIAGNOSIS — F419 Anxiety disorder, unspecified: Secondary | ICD-10-CM

## 2018-05-31 DIAGNOSIS — E1142 Type 2 diabetes mellitus with diabetic polyneuropathy: Secondary | ICD-10-CM

## 2018-06-02 MED FILL — TAGRISSO 80 MG TABLET: 80 | 15 days supply | Qty: 15 | Fill #5

## 2018-06-04 ENCOUNTER — Telehealth: Payer: Self-pay | Admitting: Genetics

## 2018-06-07 NOTE — Telephone Encounter (Signed)
Revealed negative genetic testing.  This normal result is reassuring and indicates that it is unlikely Ms. Lingard's cancer is due to a hereditary cause.  It is unlikely that there is an increased risk of another cancer due to a mutation in one of these genes.  However, genetic testing is not perfect, and cannot definitively rule out a hereditary cause.  It will be important for her to keep in contact with genetics to learn if any additional testing may be needed in the future.     Still recommended she continue to follow her doctors' recommendations regarding cancer treatment and screening- colonoscopy is typically recommended every 5 years when someone has a 1st degree relatives (patient;s mother) with colon cancer.   I also recommended other siblings/maternal relatives also consider genetic testing as there could still be a genetic cause for the cancer in her family that Ms. Callegari did not inherit and therefore was not found in her.    Ms. Scovell asked about billing for her genetic test- I shared with there the communication I received from the lab, [facility], regarding her OOP cost:  Lemar Lofty 05/25/2018 1:22 PM  Ria Comment, The OOP estimate is $50 through insurance. Patient opted for insurance. Kind Regards, [place] her to contact the lab if she has any issues or further questions.

## 2018-06-10 ENCOUNTER — Other Ambulatory Visit: Payer: Self-pay | Admitting: Oncology

## 2018-06-10 DIAGNOSIS — C801 Malignant (primary) neoplasm, unspecified: Principal | ICD-10-CM

## 2018-06-10 DIAGNOSIS — C7800 Secondary malignant neoplasm of unspecified lung: Secondary | ICD-10-CM

## 2018-06-10 NOTE — Telephone Encounter (Signed)
Dr. Mike Gip pt

## 2018-06-10 NOTE — Telephone Encounter (Signed)
...   Ref Range & Units 3wk ago 48mo ago  WBC 4.0 - 10.5 K/uL 7.1  6.5   RBC 3.87 - 5.11 MIL/uL 4.52  4.65   Hemoglobin 12.0 - 15.0 g/dL 13.9  14.4   HCT 36.0 - 46.0 % 42.1  43.8   MCV 80.0 - 100.0 fL 93.1  94.2   MCH 26.0 - 34.0 pg 30.8  31.0   MCHC 30.0 - 36.0 g/dL 33.0  32.9   RDW 11.5 - 15.5 % 12.6  12.5   Platelets 150 - 400 K/uL 259  249   nRBC 0.0 - 0.2 % 0.0  0.0   Neutrophils Relative % % 55  56   Neutro Abs 1.7 - 7.7 K/uL 3.9  3.6   Lymphocytes Relative % 36  34   Lymphs Abs 0.7 - 4.0 K/uL 2.5  2.2   Monocytes Relative % 6  6   Monocytes Absolute 0.1 - 1.0 K/uL 0.5  0.4   Eosinophils Relative % 2  3   Eosinophils Absolute 0.0 - 0.5 K/uL 0.2  0.2   Basophils Relative % 1  1   Basophils Absolute 0.0 - 0.1 K/uL 0.0  0.1   Immature Granulocytes % 0  0   Abs Immature Granulocytes 0.00 - 0.07 K/uL 0.02  0.01 CM  Comment: Performed at [facility], [address] [place], [place] [address]  Resulting Agency  [facility] CLIN LAB [facility] LAB      Specimen Collected: 05/17/18 10:53  Last Resulted: 05/17/18 11:28     Lab Flowsheet    Order Details    View Encounter    Lab and Collection Details    Routing    Result History      CM=Additional comments      Other Results from 05/17/2018   Contains abnormal data TSH  Order: [id number]   Status:  Final result  Visible to patient:  No (Not Released)  Next appt:  06/17/2018 at 11:30 AM in Radiology ([facility]-CT)  Dx:  Malignant neoplasm of lung, unspecifi...   Ref Range & Units 3wk ago 20mo ago  TSH 0.350 - 4.500 uIU/mL 4.647High   12.030High  R  Comment: Performed by a 3rd Generation assay with a functional sensitivity of <=0.01 uIU/mL.  Performed at [facility], [address] [place], [place] [address]   Resulting Agency  [facility] CLIN LAB [facility]      Specimen Collected: 05/17/18 10:58  Last Resulted: 05/17/18 15:49     Lab Flowsheet    Order Details    View Encounter    Lab and Collection  Details    Routing    Result History      R=Reference range differs from displayed range        Magnesium  Order: [id number]   Status:  Final result  Visible to patient:  No (Not Released)  Next appt:  06/17/2018 at 11:30 AM in Radiology ([facility]-CT)  Dx:  Malignant neoplasm of lung, unspecifi...   Ref Range & Units 3wk ago  Magnesium 1.7 - 2.4 mg/dL 2.3   Comment: Performed at [facility], [address], [place], [place] [address]  Resulting Agency  [facility] CLIN LAB      Specimen Collected: 05/17/18 10:53  Last Resulted: 05/17/18 11:42     Lab Flowsheet    Order Details    View Encounter    Lab and Collection Details    Routing    Result  History            CEA  Order: [id number]   Status:  Final result  Visible to patient:  No (Not Released)  Next appt:  06/17/2018 at 11:30 AM in Radiology ([facility]-CT)  Dx:  Malignant neoplasm of lung, unspecifi...   Ref Range & Units 3wk ago  CEA 0.0 - 4.7 ng/mL 2.9   Comment: (NOTE)                Nonsmokers     <3.9                Smokers       <5.6  Roche [employer]ostics Electrochemiluminescence Immunoassay  (ECLIA)  Values obtained with different assay methods or kits  cannot be used interchangeably. Results cannot be  interpreted as absolute evidence of the presence or  absence of malignant disease.  Performed At: [facility]  [place], [place] [id number]  Rush Farmer MD WF:[id number]   Resulting Agency  [facility] CLIN LAB      Specimen Collected: 05/17/18 10:53  Last Resulted: 05/18/18 05:38     Lab Flowsheet    Order Details    View Encounter    Lab and Collection Details    Routing    Result History            Contains abnormal data Comprehensive metabolic panel  Order: [id number]   Status:  Final result  Visible to patient:  No (Not Released)  Next appt:  06/17/2018 at 11:30 AM in Radiology ([facility]-CT)  Dx:  Malignant neoplasm  of lung, unspecifi...   Ref Range & Units 3wk ago (05/17/18) 84mo ago (04/08/18) 15mo ago (03/10/18) 29mo ago (01/22/18) 12mo ago (01/22/18)  Sodium 135 - 145 mmol/L 136  137    140 R  Potassium 3.5 - 5.1 mmol/L 3.5  4.2    4.1 R  Chloride 98 - 111 mmol/L 101  101    99 R  CO2 22 - 32 mmol/L 26  29    23  R  Glucose, Bld 70 - 99 mg/dL 110High   97    100High  R  BUN 6 - 20 mg/dL 13  12    11  R  Creatinine, Ser 0.44 - 1.00 mg/dL 0.74  0.92  0.80   0.82 R  Calcium 8.9 - 10.3 mg/dL 8.9  9.0    9.6 R  Total Protein 6.5 - 8.1 g/dL 7.3  7.8   7.0 R   Albumin 3.5 - 5.0 g/dL 4.1  4.0    3.9 R  AST 15 - 41 U/L 27  22   13  R   ALT 0 - 44 U/L 27  20   13  R   Alkaline Phosphatase 38 - 126 U/L 65  79   106 R   Total Bilirubin 0.3 - 1.2 mg/dL 0.5  0.7   0.3 R   GFR calc non Af Amer >60 mL/min >60  >60    79 R  GFR calc Af Amer >60 mL/min >60  >60    91 R  Anion gap 5 - 15 9  7  CM     Comment: Performed at [facility], [address] [place], [place] [address]  Resulting Agency  [facility] CLIN LAB [facility] LAB [facility]      Specimen Collected: 05/17/18 10:53  Last Resulted: 05/17/18 11:42

## 2018-06-11 ENCOUNTER — Ambulatory Visit: Payer: Self-pay | Admitting: Genetics

## 2018-06-11 ENCOUNTER — Encounter: Payer: Self-pay | Admitting: Genetics

## 2018-06-11 DIAGNOSIS — C7800 Secondary malignant neoplasm of unspecified lung: Secondary | ICD-10-CM

## 2018-06-11 DIAGNOSIS — Z1379 Encounter for other screening for genetic and chromosomal anomalies: Secondary | ICD-10-CM

## 2018-06-11 DIAGNOSIS — Z808 Family history of malignant neoplasm of other organs or systems: Secondary | ICD-10-CM

## 2018-06-11 DIAGNOSIS — Z800 Family history of malignant neoplasm of digestive organs: Secondary | ICD-10-CM

## 2018-06-11 DIAGNOSIS — Z803 Family history of malignant neoplasm of breast: Secondary | ICD-10-CM

## 2018-06-11 DIAGNOSIS — C801 Malignant (primary) neoplasm, unspecified: Secondary | ICD-10-CM

## 2018-06-11 DIAGNOSIS — Z8041 Family history of malignant neoplasm of ovary: Secondary | ICD-10-CM

## 2018-06-11 NOTE — Progress Notes (Signed)
HPI:  Ms. Waldo was previously seen in the [facility] on 05/20/2018 due to a personal and family history of cancer and concerns regarding a hereditary predisposition to cancer. Please refer to our prior cancer genetics clinic note for more information regarding Ms. Wiler's medical, social and family histories, and our assessment and recommendations, at the time. Ms. Elbaum recent genetic test results were disclosed to her, as well as recommendations warranted by these results. These results and recommendations are discussed in more detail below.  CANCER HISTORY:    Malignant neoplasm of lung ([facility])   03/01/2018 Initial Diagnosis    Malignant neoplasm of lung ([facility])    03/01/2018 -  Chemotherapy    The patient had palonosetron (ALOXI) injection 0.25 mg, 0.25 mg, Intravenous,  Once, 0 of 4 cycles PEMEtrexed (ALIMTA) 1,000 mg in sodium chloride 0.9 % 100 mL chemo infusion, 500 mg/m2, Intravenous,  Once, 0 of 6 cycles CARBOplatin (PARAPLATIN) in sodium chloride 0.9 % 100 mL chemo infusion, , Intravenous,  Once, 0 of 4 cycles pembrolizumab (KEYTRUDA) 200 mg in sodium chloride 0.9 % 50 mL chemo infusion, 200 mg, Intravenous, Once, 0 of 6 cycles fosaprepitant (EMEND) 150 mg, dexamethasone (DECADRON) 12 mg in sodium chloride 0.9 % 145 mL IVPB, , Intravenous,  Once, 0 of 4 cycles  for chemotherapy treatment.     03/12/2018 Cancer Staging    Staging form: Lung, AJCC 8th Edition - Clinical stage from 03/12/2018: Stage IV (cT1, cN3, pM1) - Signed by Earlie Server, MD on 03/12/2018      FAMILY HISTORY:  We obtained a detailed, 4-generation family history.  Significant diagnoses are listed below: Family History  Problem Relation Age of Onset  . Colon cancer Mother 24  . Colon cancer Maternal Grandmother 78  . Hypertension Father   . Breast cancer Sister 79  . Throat cancer Brother   . Heart attack Maternal Aunt   . Ovarian cancer Maternal Aunt 70  . Stomach cancer Paternal  Grandmother        dx >50  . Throat cancer Brother     Ms. Janowski has 2 sons in their 20's and a daughter who is 20.  She has no grandchildren at this time.   Ms. Kollman has 3 sisters and 3 brothers.  -2 brothers had throat cancer -1 sister was dx with breast cancer at 52.   -1 sister had liver disease.  -1 brother is deceased, unk cause -1 sister is 51 with no hx of cancer.   Ms. Zupko father: died at 39 with no hx of cancer.  Paternal Aunts/Uncles: 1 paternal aunt and 3 paternal uncles with no known hx of cancer.  Paternal cousins: no known hx of cancer.  Paternal grandfather: unk cause of death, died in late 67's.  Paternal grandmother:stomach cancer dx >50  Ms. Swiss's mother: had colon cancer dx late 70's, she did not die from this cancer.  Maternal Aunts/Uncles: 2 maternal aunts and 1 maternal uncle.  The uncle died in war. 1 aunt had ovarian cancer dx in her 7's.  1 aunt died of age related disease.  Maternal cousins: possibly throat cancer in 33 female cousin.  Maternal grandfather: died at 46, no hx of cancer.  Maternal grandmother:died of colon cancer at 55.   Ms. Cordial is unaware of previous family history of genetic testing for hereditary cancer risks. Patient's maternal ancestors are of Native American descent, and paternal ancestors are of [place] descent. There is no reported  Ashkenazi Jewish ancestry. There is no known consanguinity.  GENETIC TEST RESULTS: Genetic testing performed through [facility]'s Multi-Cancer Panel reported out on 05/31/2018 showed no pathogenic mutations.   The Multi-Cancer Panel offered by [facility] includes sequencing and/or deletion duplication testing of the following 90 genes: AIP, ALK, APC, ATM, AXIN2, BAP1, BARD1, BLM, BMPR1A, BRCA1, BRCA2, BRIP1, BUB1B, CASR, CDC73, CDH1, CDK4, CDKN1B, CDKN1C, CDKN2A, CEBPA, CHEK2, CTNNA1, DICER1, DIS3L2, EGFR, ENG, EPCAM, FH, FLCN, GALNT12, GATA2, GPC3, GREM1, HOXB13, HRAS, KIT, MAX, MEN1, MET, MITF,  MLH1, MLH3, MSH2, MSH3, MSH6, MUTYH, NBN, NF1, NF2, NTHL1, PALB2, PDGFRA, PHOX2B, PMS2, POLD1, POLE, POT1, PRKAR1A, PTCH1, PTEN, RAD50, RAD51C, RAD51D, RB1, RECQL4, RET, RNF43, RPS20, RUNX1, SDHA, SDHAF2, SDHB, SDHC, SDHD, SMAD4, SMARCA4, SMARCB1, SMARCE1, STK11, SUFU, TERC, TERT, TMEM127, TP53, TSC1, TSC2, VHL, WRN, WT1.  The test report will be scanned into EPIC and will be located under the Molecular Pathology section of the Results Review tab. A portion of the result report is included below for reference.     We discussed with Ms. Fahrner that because current genetic testing is not perfect, it is possible there may be a gene mutation in one of these genes that current testing cannot detect, but that chance is small.  We also discussed, that there could be another gene that has not yet been discovered, or that we have not yet tested, that is responsible for the cancer diagnoses in the family. It is also possible there is a hereditary cause for the cancer in the family that Ms. Frye did not inherit and therefore was not identified in her testing.  Therefore, it is important to remain in touch with cancer genetics in the future so that we can continue to offer Ms. Burggraf the most up to date genetic testing.   ADDITIONAL GENETIC TESTING: We discussed with Ms. Filion that her genetic testing was fairly extensive.  If there are are genes identified to increase cancer risk that can be analyzed in the future, we would be happy to discuss and coordinate this testing at that time.    CANCER SCREENING RECOMMENDATIONS: Ms. Sandstrom test result is considered negative (normal).  This means that we have not identified a hereditary cause for her personal and family history of cancer at this time.  No germline EGFR mutations were identified.    While reassuring, this does not definitively rule out a hereditary predisposition to cancer. It is still possible that there could be genetic mutations that are  undetectable by current technology, or genetic mutations in genes that have not been tested or identified to increase cancer risk.  Therefore, it is recommended she continue to follow the cancer management and screening guidelines provided by her oncology and primary healthcare provider. An individual's cancer risk is not determined by genetic test results alone.  Overall cancer risk assessment includes additional factors such as personal medical history, family history, etc.  These should be used to make a personalized plan for cancer prevention and surveillance.    RECOMMENDATIONS FOR FAMILY MEMBERS:  Relatives in this family might be at some increased risk of developing cancer, over the general population risk, simply due to the family history of cancer.  We recommended women in this family have a yearly mammogram beginning at age 31, or 64 years younger than the earliest onset of cancer, an annual clinical breast exam, and perform monthly breast self-exams. Women in this family should also have a gynecological exam as recommended by their primary provider. All family members should  have a colonoscopy by age 30 (or as directed by their doctors).  All family members should inform their physicians about the family history of cancer so their doctors can make the most appropriate screening recommendations for them.   It is also possible there is a hereditary cause for the cancer in Ms. Bar's family that she did not inherit and therefore was not identified in her.  Therefore, we recommended her maternal relatives also consider genetic counseling and testing. Ms. Nilsen will let us know if we can be of any assistance in coordinating genetic counseling and/or testing for these family members.   FOLLOW-UP: Lastly, we discussed with Ms. Johanson that cancer genetics is a rapidly advancing field and it is possible that new genetic tests will be appropriate for her and/or her family members in the future. We  encouraged her to remain in contact with cancer genetics on an annual basis so we can update her personal and family histories and let her know of advances in cancer genetics that may benefit this family.   Our contact number was provided. Ms. Pitter questions were answered to her satisfaction, and she knows she is welcome to call us at anytime with additional questions or concerns.   Ferol Luz, MS, [facility] Certified Genetic Counselor [email or web address]

## 2018-06-17 ENCOUNTER — Ambulatory Visit
Admission: RE | Admit: 2018-06-17 | Discharge: 2018-06-17 | Disposition: A | Discharge status: Home / Self Care | Payer: BLUE CROSS/BLUE SHIELD | Source: Ambulatory Visit | Attending: Urgent Care | Admitting: Urgent Care

## 2018-06-17 DIAGNOSIS — Z5181 Encounter for therapeutic drug level monitoring: Secondary | ICD-10-CM | POA: Diagnosis present

## 2018-06-17 DIAGNOSIS — C3490 Malignant neoplasm of unspecified part of unspecified bronchus or lung: Secondary | ICD-10-CM | POA: Diagnosis not present

## 2018-06-17 MED ORDER — IOHEXOL 300 MG/ML  SOLN
75.0000 mL | Freq: Once | INTRAMUSCULAR | Status: AC | PRN
  Administered 2018-06-17: 75 mL via INTRAVENOUS

## 2018-06-18 ENCOUNTER — Ambulatory Visit: Payer: BLUE CROSS/BLUE SHIELD | Admitting: Hematology and Oncology

## 2018-06-18 ENCOUNTER — Encounter: Payer: Self-pay | Admitting: Hematology and Oncology

## 2018-06-18 ENCOUNTER — Inpatient Hospital Stay: Payer: BLUE CROSS/BLUE SHIELD

## 2018-06-18 ENCOUNTER — Inpatient Hospital Stay: Payer: BLUE CROSS/BLUE SHIELD | Attending: Hematology and Oncology | Admitting: Hematology and Oncology

## 2018-06-18 VITALS — BP 138/71 | HR 67 | Temp 97.7°F | Resp 18 | Ht 66.0 in | Wt 192.2 lb

## 2018-06-18 DIAGNOSIS — C3490 Malignant neoplasm of unspecified part of unspecified bronchus or lung: Secondary | ICD-10-CM

## 2018-06-18 DIAGNOSIS — Z7189 Other specified counseling: Secondary | ICD-10-CM

## 2018-06-18 DIAGNOSIS — C3432 Malignant neoplasm of lower lobe, left bronchus or lung: Secondary | ICD-10-CM | POA: Diagnosis present

## 2018-06-18 DIAGNOSIS — F419 Anxiety disorder, unspecified: Secondary | ICD-10-CM

## 2018-06-18 LAB — CBC WITH DIFFERENTIAL/PLATELET
Abs Immature Granulocytes: 0.03 10*3/uL (ref 0.00–0.07)
Basophils Absolute: 0.1 10*3/uL (ref 0.0–0.1)
Basophils Relative: 1 %
Eosinophils Absolute: 0.1 10*3/uL (ref 0.0–0.5)
Eosinophils Relative: 1 %
HCT: 42.1 % (ref 36.0–46.0)
Hemoglobin: 14.1 g/dL (ref 12.0–15.0)
Immature Granulocytes: 0 %
Lymphocytes Relative: 39 %
Lymphs Abs: 3.2 10*3/uL (ref 0.7–4.0)
MCH: 31.3 pg (ref 26.0–34.0)
MCHC: 33.5 g/dL (ref 30.0–36.0)
MCV: 93.6 fL (ref 80.0–100.0)
Monocytes Absolute: 0.5 10*3/uL (ref 0.1–1.0)
Monocytes Relative: 6 %
Neutro Abs: 4.3 10*3/uL (ref 1.7–7.7)
Neutrophils Relative %: 53 %
Platelets: 254 10*3/uL (ref 150–400)
RBC: 4.5 MIL/uL (ref 3.87–5.11)
RDW: 12.8 % (ref 11.5–15.5)
WBC: 8.2 10*3/uL (ref 4.0–10.5)
nRBC: 0 % (ref 0.0–0.2)

## 2018-06-18 LAB — COMPREHENSIVE METABOLIC PANEL
ALT: 25 U/L (ref 0–44)
AST: 22 U/L (ref 15–41)
Albumin: 4.3 g/dL (ref 3.5–5.0)
Alkaline Phosphatase: 70 U/L (ref 38–126)
Anion gap: 8 (ref 5–15)
BUN: 13 mg/dL (ref 6–20)
CO2: 26 mmol/L (ref 22–32)
Calcium: 8.8 mg/dL — ABNORMAL LOW (ref 8.9–10.3)
Chloride: 102 mmol/L (ref 98–111)
Creatinine, Ser: 0.8 mg/dL (ref 0.44–1.00)
GFR calc Af Amer: >60 mL/min (ref >60)
GFR calc non Af Amer: >60 mL/min (ref >60)
Glucose, Bld: 89 mg/dL (ref 70–99)
Potassium: 4 mmol/L (ref 3.5–5.1)
Sodium: 136 mmol/L (ref 135–145)
Total Bilirubin: 0.6 mg/dL (ref 0.3–1.2)
Total Protein: 7.7 g/dL (ref 6.5–8.1)

## 2018-06-18 MED FILL — TAGRISSO 80 MG TABLET: 80 | 15 days supply | Qty: 15 | Fill #0

## 2018-06-18 NOTE — Progress Notes (Signed)
No new changes noted today 

## 2018-06-18 NOTE — Progress Notes (Signed)
[facility]     [address], [address]     [place], [place] [address]     Phone: [phone]      Fax: [phone]        Clinic day:  06/18/2018  Chief Complaint: Kristina Wright is a 63 y.o. female with stage IV adenocarcinoma of the lung on osimertinib who is seen for review of interval chest CT and 1 month assessment.  HPI:  The patient was last seen in the medical oncology clinic on 05/17/2018 for initial assessment.  She had been on osimertinib for 8 weeks.  She was doing well.  Many symptoms initially associated with osimertinib had faded.   She denies any increased shortness of breath or chest pain.   EKG revealed NSR at 62 with QTc 422 ms.  Chest CT on 06/17/2018 revealed marked interval reduction of mediastinal and right hilar lymphadenopathy. There was no new lymphadenopathy in the chest.  There was slight increase in the left lower lobe pulmonary nodule (8 mm to 10 mm).  This was not hypermetabolic on the previous PET-CT.  There was interval development of a 7 mm nodule in the medial right upper lobe in a background of small airway impaction and subtle tree-in-bud nodularity. Imaging features felt to be most likely related to atypical infection (including MAI). Consider appropriate therapy prior to repeat imaging to assess for resolution.  There was stable 14 mm left breast nodule.  During the interim, patient is doing well overall. She notes that the osimertinib makes her nervous and "jittery". She is currently on Buspar in addition to alprazolam PRN. Patient states, "Dr. Ronnald Ramp will only give me 10. I was addicted to them in the past. I only take them when the Buspar is not enough. I am by myself all of the time and I am paranoid". She denies any significant side effects associated with her osimertinib therapy.   Patient denies that she has experienced any B symptoms. She denies any interval infections. She denies increased shortness of breath and episodes  of chest pain. Patient does no experience orthopnea when supine.   Patient advises that she maintains an adequate appetite. She is eating well. Weight today is 192 lb 3.9 oz (87.2 kg), which compared to her last visit to the clinic, represents a 5 pound increase.     Patient denies pain in the clinic today.   Past Medical History:  Diagnosis Date  . Cancer ([facility]) 1989   cervical  . Depression   . Family history of breast cancer   . Family history of colon cancer   . Family history of ovarian cancer   . Family history of stomach cancer   . Family history of throat cancer   . Hyperlipidemia   . Hypertension   . Malignant neoplasm of lung ([facility]) 03/01/2018  . Thyroid disease     Past Surgical History:  Procedure Laterality Date  . BILATERAL CARPAL TUNNEL RELEASE    . BREAST CYST ASPIRATION Left   . LUNG BIOPSY    . PARTIAL HYSTERECTOMY    . PORTA CATH INSERTION N/A 03/03/2018   Procedure: PORTA CATH INSERTION;  Surgeon: Katha Cabal, MD;  Location: [facility];  Service: Cardiovascular;  Laterality: N/A;  . TUBAL LIGATION      Family History  Problem Relation Age of Onset  . Colon cancer Mother 10  . Colon cancer Maternal Grandmother 78  . Hypertension Father   .  Breast cancer Sister 6  . Throat cancer Brother   . Heart attack Maternal Aunt   . Ovarian cancer Maternal Aunt 70  . Stomach cancer Paternal Grandmother        dx >50  . Throat cancer Brother     Social History:  reports that she quit smoking about 6 months ago. Her smoking use included cigarettes. She has a 25.00 pack-year smoking history. She has never used smokeless tobacco. She reports current drug use. Drug: Marijuana. She reports that she does not drink alcohol.  She has a [employer] and [employer].  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  Her ex-husband's name is Kristina Wright (accompanied her on her initial visit).  The patient is accompanied by her  son, Kristina Wright, today.  Allergies: No Known Allergies  Current Medications: Current Outpatient Medications  Medication Sig Dispense Refill  . aspirin EC 81 MG tablet Take 81 mg by mouth daily.    Marland Kitchen atorvastatin (LIPITOR) 10 MG tablet Take 1 tablet (10 mg total) by mouth daily. 90 tablet 1  . busPIRone (BUSPAR) 5 MG tablet Take 1 tablet (5 mg total) by mouth daily. 90 tablet 1  . clopidogrel (PLAVIX) 75 MG tablet Take 1 tablet (75 mg total) by mouth daily. 90 tablet 1  . FLUoxetine (PROZAC) 10 MG capsule Take 1 capsule (10 mg total) by mouth daily. 90 capsule 1  . levothyroxine (SYNTHROID, LEVOTHROID) 75 MCG tablet Take 1 tablet (75 mcg total) by mouth daily. 90 tablet 1  . lisinopril-hydrochlorothiazide (PRINZIDE,ZESTORETIC) 10-12.5 MG tablet Take 1 tablet by mouth daily. 90 tablet 1  . Multiple Vitamins-Minerals (MULTIVITAMIN ADULT PO) Take 1 tablet by mouth daily.     Marland Kitchen TAGRISSO 80 MG tablet TAKE 1 TABLET (80 MG TOTAL) BY MOUTH DAILY. 30 tablet 2  . albuterol (PROVENTIL HFA;VENTOLIN HFA) 108 (90 Base) MCG/ACT inhaler Inhale 2 puffs into the lungs every 6 (six) hours as needed for wheezing or shortness of breath. (Patient not taking: Reported on 06/18/2018) 1 Inhaler 0  . ALPRAZolam (XANAX) 0.25 MG tablet Take 1 tablet (0.25 mg total) by mouth at bedtime as needed. for anxiety (Patient not taking: Reported on 06/18/2018) 10 tablet 0  . lidocaine-prilocaine (EMLA) cream Apply to affected area once (Patient not taking: Reported on 06/18/2018) 30 g 3  . ondansetron (ZOFRAN) 8 MG tablet Take 1 tablet (8 mg total) by mouth 2 (two) times daily as needed (Nausea or vomiting). Start if needed on the third day after chemotherapy. (Patient not taking: Reported on 06/18/2018) 30 tablet 1  . prochlorperazine (COMPAZINE) 10 MG tablet Take 1 tablet (10 mg total) by mouth every 6 (six) hours as needed (Nausea or vomiting). (Patient not taking: Reported on 06/18/2018) 30 tablet 1   No current facility-administered  medications for this visit.     Review of Systems:  GENERAL:  Feels "ok".  No fevers, sweats.  Weight up 5 pounds. PERFORMANCE STATUS (ECOG):  1 HEENT:  No visual changes, runny nose, sore throat, mouth sores or tenderness. Lungs: No shortness of breath or cough.  No hemoptysis. Cardiac:  No chest pain, palpitations, orthopnea, or PND. GI:  Loose stools.  No nausea, vomiting, constipation, melena or hematochezia. GU:  No urgency, frequency, dysuria, or hematuria. Musculoskeletal:  No back pain.  No joint pain.  No muscle tenderness. Extremities:  No pain or swelling. Skin:  No rashes or skin changes. Neuro:  No headache, numbness or weakness, balance or coordination issues.  Endocrine:  No diabetes.  Thyroid disease on Synthroid. No hot flashes or night sweats. Psych:  Anxious; takes 1/2-1 Xanax.  Paranoid.  No depression. Pain:  No focal pain. Review of systems:  All other systems reviewed and found to be negative.   Physical Exam: Blood pressure 138/71, pulse 67, temperature 97.7 F (36.5 C), temperature source Tympanic, resp. rate 18, height 5' 6"  (1.676 m), weight 192 lb 3.9 oz (87.2 kg), SpO2 100 %. GENERAL:  Well developed, well nourished, woman sitting comfortably in the exam room in no acute distress. MENTAL STATUS:  Alert and oriented to person, place and time. HEAD:  Shoulder length dark hair.  Normocephalic, atraumatic, face symmetric, no Cushingoid features. EYES:  Hazel eyes.  Pupils equal round and reactive to light and accomodation.  No conjunctivitis or scleral icterus. ENT:  Oropharynx clear without lesion.  Tongue normal. Mucous membranes moist.  RESPIRATORY:  Clear to auscultation without rales, wheezes or rhonchi. CARDIOVASCULAR:  Regular rate and rhythm without murmur, rub or gallop. ABDOMEN:  Soft, non-tender, with active bowel sounds, and no hepatosplenomegaly.  No masses. SKIN:  No rashes, ulcers or lesions. EXTREMITIES: No edema, no skin discoloration or  tenderness.  No palpable cords. LYMPH NODES: No palpable cervical, supraclavicular, axillary or inguinal adenopathy  NEUROLOGICAL: Unremarkable. PSYCH:  Appropriate.    No visits with results within 3 Day(s) from this visit.  Latest known visit with results is:  Office Visit on 05/17/2018  Component Date Value Ref Range Status  . Free T4 05/17/2018 0.82  0.82 - 1.77 ng/dL Final   Comment: (NOTE) Biotin ingestion may interfere with free T4 tests. If the results are inconsistent with the TSH level, previous test results, or the clinical presentation, then consider biotin interference. If needed, order repeat testing after stopping biotin. Performed at [facility], [address] [place], [place] [address]     Assessment:  Kristina Wright is a 63 y.o. female with stage IV adenocarcinoma of the lung s/p right axillary biopsy on 02/24/2018.  Pathology revealed metastatic adenocarcinoma c/w lung primary.  Tumor cells were positive for CK7 and TTF-1 with minimal blush reactivity for CK20.  Tumor cells were negative for GATA3, ER, and PR.  Omniseq testing revealed EGFR c.2582>A (L861Q) mutation and PDL-1 60% TPS.  CEA was 2.9 on 05/17/2018.  Chest CT 01/19/2018 revealed a 8.7 x 5.8 mm slightly irregular pulmonary nodule within the anterior left lower lobe.  There are no other concerning pulmonary nodules.  There was a focal 3.1 mm x 9.2 mm pleural nodule at the right middle lobe minor fissure felt likely a benign linear scar or lymphoid tissue of the pleura.   There was mediastinal adenopathy including a 1.7 x 3.7 cm precarinal lymph node mass and a 1.7 cm right hilar lymph node.  PET scan on 02/09/2018 revealed an unusual pattern of nodal metastasis with intensely hypermetabolic enlarged precarinal lymph node, RIGHT hilar lymph node and RIGHT axillary lymph node.  Recommend biopsy of the RIGHT axial lymph node.  There was no metabolic activity associated with the RIGHT lobe pulmonary  nodule.  There was no additional evidence primary or metastatic carcinoma.  Clinical stage [id number].  Chest CT on 06/17/2018 revealed marked interval reduction of mediastinal and right hilar lymphadenopathy. There was no new lymphadenopathy in the chest.  There was slight increase in the left lower lobe pulmonary nodule (8 mm to 10 mm).  This was not hypermetabolic on the previous PET-CT.  There was interval  development of a 7 mm nodule in the medial right upper lobe in a background of small airway impaction and subtle tree-in-bud nodularity.   Head MRI on 03/10/2018 revealed no evidence of metastatic disease.  She began osimertinib on 03/25/2018.  She is tolerating treatment well.  She has a history of cervical cancer in 1989.  She underwent cryotherapy.  Symptomatically, she feels anxious.  She denies any chest pain, shortness of breath or cough.  Exam is unremarkable.  Plan: 1.  Labs today:  CBC with diff, CMP. 2.  Stage IV adenocarcinoma of the Lung (EGFR L861Q mutation)  Clinically, patient is doing well.  Discuss interval chest CT.  Images personally reviewed.  Agree with radiology interpretation.   Overall, scan looks very good.  Unclear significance of tiny change (2 mm) in LLL nodule and new 7 mm RUL nodule (? inflammatory).  Review potential side effects associated with osimertinib.   EKG at last visit was good- no increased QTc.   Schedule echo at baseline and every 3 months.  Review overall management lung cancer and plan for long term treatment. 3.   Anxiety  Patient notes anxiety associated with diagnosis of malignancy.  Discuss follow-up with Dr. Ronnald Ramp. 4.   Continue port flushes every 6-8 weeks. 5.   RTC on 06/21/2018 for labs (CBC with diff, CMP) and port flush. 6.   RTC in 1 month for labs (CBC with diff, CMP). 7.   RTC after chest CT for MD assessment, labs (CBC with diff, CMP), and review of imaging.  I discussed the assessment and treatment plan with the patient.   The patient was provided an opportunity to ask questions and all were answered.  The patient agreed with the plan and demonstrated an understanding of the instructions.  The patient was advised to call back or seek an in person evaluation if the symptoms worsen or if the condition fails to improve as anticipated.    Honor Loh, NP  06/18/2018, 3:45 PM   I saw and evaluated the patient, participating in the key portions of the service and reviewing pertinent diagnostic studies and records.  I reviewed the nurse practitioner's note and agree with the findings and the plan.  The assessment and plan were discussed with the patient.  Multiple questions were asked by the patient and answered.   Nolon Stalls, MD 06/18/2018,3:45 PM

## 2018-06-23 ENCOUNTER — Telehealth: Payer: Self-pay

## 2018-06-23 NOTE — Telephone Encounter (Signed)
Returned patient's call regarding refill request for Alprazolam. Informed patient Dr. Mike Gip is not in the office this week to refill medication. Informed her she could contact PCP for refill since PCP filled this for her in the past. Patient states she will wait until Dr. Mike Gip returns to have medication refilled.

## 2018-06-30 ENCOUNTER — Other Ambulatory Visit: Payer: Self-pay

## 2018-06-30 DIAGNOSIS — F419 Anxiety disorder, unspecified: Secondary | ICD-10-CM

## 2018-07-01 ENCOUNTER — Telehealth: Payer: Self-pay

## 2018-07-01 MED FILL — TAGRISSO 80 MG TABLET: 80 | 15 days supply | Qty: 15 | Fill #1

## 2018-07-01 NOTE — Telephone Encounter (Signed)
Informed patient Dr. Mike Gip advises her to follow up with her PCP to manage her Alprazolam RX. Patient verbalizes understanding.

## 2018-07-03 ENCOUNTER — Other Ambulatory Visit: Payer: Self-pay | Admitting: Family Medicine

## 2018-07-03 DIAGNOSIS — F419 Anxiety disorder, unspecified: Secondary | ICD-10-CM

## 2018-07-05 ENCOUNTER — Ambulatory Visit: Admission: RE | Admit: 2018-07-05 | Payer: BLUE CROSS/BLUE SHIELD | Source: Ambulatory Visit

## 2018-07-15 MED FILL — TAGRISSO 80 MG TABLET: 80 | 15 days supply | Qty: 15 | Fill #2

## 2018-07-16 ENCOUNTER — Other Ambulatory Visit: Payer: BLUE CROSS/BLUE SHIELD

## 2018-07-20 ENCOUNTER — Ambulatory Visit: Payer: BLUE CROSS/BLUE SHIELD

## 2018-07-27 ENCOUNTER — Other Ambulatory Visit: Payer: Self-pay

## 2018-07-27 ENCOUNTER — Ambulatory Visit
Admission: RE | Admit: 2018-07-27 | Discharge: 2018-07-27 | Disposition: A | Discharge status: Home / Self Care | Payer: BLUE CROSS/BLUE SHIELD | Source: Ambulatory Visit | Attending: Urgent Care | Admitting: Urgent Care

## 2018-07-27 DIAGNOSIS — C3490 Malignant neoplasm of unspecified part of unspecified bronchus or lung: Secondary | ICD-10-CM | POA: Insufficient documentation

## 2018-07-27 NOTE — Progress Notes (Signed)
*  PRELIMINARY RESULTS* Echocardiogram 2D Echocardiogram has been performed.  Kristina Wright 07/27/2018, 11:05 AM

## 2018-07-30 MED FILL — TAGRISSO 80 MG TABLET: 80 | 15 days supply | Qty: 15 | Fill #3

## 2018-08-16 ENCOUNTER — Encounter: Payer: Self-pay | Admitting: Family Medicine

## 2018-08-16 ENCOUNTER — Other Ambulatory Visit: Payer: Self-pay | Admitting: Family Medicine

## 2018-08-16 ENCOUNTER — Ambulatory Visit: Admission: RE | Admit: 2018-08-16 | Payer: BLUE CROSS/BLUE SHIELD | Source: Ambulatory Visit

## 2018-08-16 ENCOUNTER — Ambulatory Visit (INDEPENDENT_AMBULATORY_CARE_PROVIDER_SITE_OTHER): Payer: BLUE CROSS/BLUE SHIELD | Admitting: Family Medicine

## 2018-08-16 ENCOUNTER — Other Ambulatory Visit: Payer: Self-pay

## 2018-08-16 VITALS — BP 117/69 | Ht 66.0 in | Wt 190.0 lb

## 2018-08-16 DIAGNOSIS — W19XXXA Unspecified fall, initial encounter: Secondary | ICD-10-CM | POA: Diagnosis not present

## 2018-08-16 DIAGNOSIS — H8309 Labyrinthitis, unspecified ear: Secondary | ICD-10-CM

## 2018-08-16 DIAGNOSIS — F419 Anxiety disorder, unspecified: Secondary | ICD-10-CM

## 2018-08-16 MED ORDER — MECLIZINE HCL 25 MG PO TABS: 25.0000 mg | ORAL_TABLET | Freq: Three times a day (TID) | ORAL | 0 refills | Status: DC | PRN

## 2018-08-16 MED ORDER — ALPRAZOLAM 0.25 MG PO TABS: 0.2500 mg | ORAL_TABLET | Freq: Every evening | ORAL | 5 refills | Status: DC | PRN

## 2018-08-16 NOTE — Progress Notes (Signed)
Date:  08/16/2018   Name:  Kristina Wright   DOB:  24-Aug-1955   MRN:  [id number]   Chief Complaint: Fall (was dizzy- felt like ceiling was moving-when tried to get out of bed and slid down the bed to the floor. Did not lose consciousness or hit any part of the body. Not in any pain, except for toes- stupmed on bed. CT was cancelled and Mike Gip said to f/up. Hydrated and ate- feels better, dizziness is better.) and Anxiety (GAD7=6)  I connected withthis patient, Kristina Wright, by telephoneat the patient's home.  I verified that I am speaking with the correct person using two identifiers. This visit was conducted via telephone due to the Covid-19 outbreak from my office at [facility] in [place], [place]. I discussed the limitations, risks, security and privacy concerns of performing an evaluation and management service by telephone. I also discussed with the patient that there may be a patient responsible charge related to this service. The patient expressed understanding and agreed to proceed.  Fall  The accident occurred 6 to 12 hours ago. The fall occurred from a bed. She fell from a height of 1 to 2 ft. She landed on hard floor. The volume of blood lost was minimal. The point of impact was the left foot and right knee. The pain is present in the right knee and left foot. The pain is at a severity of 5/10. The pain is moderate. Associated symptoms include nausea. Pertinent negatives include no abdominal pain, fever, headaches, hematuria, numbness or vomiting. She has tried nothing for the symptoms. The treatment provided moderate relief.  Anxiety  Presents for follow-up visit. Symptoms include excessive worry, nausea, nervous/anxious behavior and panic. Patient reports no chest pain, compulsions, confusion, decreased concentration, depressed mood, dizziness, feeling of choking, hyperventilation, impotence, insomnia, irritability, malaise, muscle tension, obsessions, palpitations, restlessness,  shortness of breath or suicidal ideas. Symptoms occur occasionally. The severity of symptoms is mild.      Review of Systems  Constitutional: Negative.  Negative for chills, fatigue, fever, irritability and unexpected weight change.  HENT: Negative for congestion, ear discharge, ear pain, rhinorrhea, sinus pressure, sneezing and sore throat.   Eyes: Negative for photophobia, pain, discharge, redness and itching.  Respiratory: Negative for cough, shortness of breath, wheezing and stridor.   Cardiovascular: Negative for chest pain and palpitations.  Gastrointestinal: Positive for nausea. Negative for abdominal pain, blood in stool, constipation, diarrhea and vomiting.  Endocrine: Negative for cold intolerance, heat intolerance, polydipsia, polyphagia and polyuria.  Genitourinary: Negative for dysuria, flank pain, frequency, hematuria, impotence, menstrual problem, pelvic pain, urgency, vaginal bleeding and vaginal discharge.  Musculoskeletal: Negative for arthralgias, back pain and myalgias.  Skin: Negative for rash.  Allergic/Immunologic: Negative for environmental allergies and food allergies.  Neurological: Negative for dizziness, weakness, light-headedness, numbness and headaches.  Hematological: Negative for adenopathy. Does not bruise/bleed easily.  Psychiatric/Behavioral: Negative for confusion, decreased concentration, dysphoric mood and suicidal ideas. The patient is nervous/anxious. The patient does not have insomnia.     Patient Active Problem List   Diagnosis Date Noted   Genetic testing 06/11/2018   Family history of breast cancer    Family history of colon cancer    Family history of ovarian cancer    Family history of stomach cancer    Family history of throat cancer    Metastatic adenocarcinoma to lung with unknown primary site [facility]) 04/27/2018   Depression 03/12/2018   Malignant neoplasm of lung ([place]) 03/01/2018  Goals of care, counseling/discussion  03/01/2018   Tobacco use disorder 02/08/2016   Essential hypertension 02/08/2016   PVD (peripheral vascular disease) ([place]) 02/08/2016   Blue toe syndrome of right lower extremity ([place]) 02/08/2016    No Known Allergies  Past Surgical History:  Procedure Laterality Date   BILATERAL CARPAL TUNNEL RELEASE     BREAST CYST ASPIRATION Left    LUNG BIOPSY     PARTIAL HYSTERECTOMY     PORTA CATH INSERTION N/A 03/03/2018   Procedure: PORTA CATH INSERTION;  Surgeon: Katha Cabal, MD;  Location: [facility];  Service: Cardiovascular;  Laterality: N/A;   TUBAL LIGATION      Social History   Tobacco Use   Smoking status: Former Smoker    Packs/day: 1.00    Years: 25.00    Pack years: 25.00    Types: Cigarettes    Last attempt to quit: 11/19/2017    Years since quitting: 0.7   Smokeless tobacco: Never Used  Substance Use Topics   Alcohol use: No   Drug use: Yes    Types: Marijuana    Comment: occasional     Medication list has been reviewed and updated.  Current Meds  Medication Sig   aspirin EC 81 MG tablet Take 81 mg by mouth daily.   atorvastatin (LIPITOR) 10 MG tablet Take 1 tablet (10 mg total) by mouth daily.   busPIRone (BUSPAR) 5 MG tablet Take 1 tablet (5 mg total) by mouth daily.   clopidogrel (PLAVIX) 75 MG tablet Take 1 tablet (75 mg total) by mouth daily.   FLUoxetine (PROZAC) 10 MG capsule Take 1 capsule (10 mg total) by mouth daily.   levothyroxine (SYNTHROID, LEVOTHROID) 75 MCG tablet Take 1 tablet (75 mcg total) by mouth daily.   lisinopril-hydrochlorothiazide (PRINZIDE,ZESTORETIC) 10-12.5 MG tablet Take 1 tablet by mouth daily.   Multiple Vitamins-Minerals (MULTIVITAMIN ADULT PO) Take 1 tablet by mouth daily.    ondansetron (ZOFRAN) 8 MG tablet Take 1 tablet (8 mg total) by mouth 2 (two) times daily as needed (Nausea or vomiting). Start if needed on the third day after chemotherapy.   TAGRISSO 80 MG tablet TAKE 1 TABLET (80  MG TOTAL) BY MOUTH DAILY.    PHQ 2/9 Scores 01/07/2018 09/29/2017 07/20/2017  PHQ - 2 Score 1 2 6   PHQ- 9 Score 6 4 18     BP Readings from Last 3 Encounters:  08/16/18 117/69  06/18/18 138/71  05/17/18 (!) 147/85    Physical Exam Neurological:     Mental Status: She is alert.     Wt Readings from Last 3 Encounters:  08/16/18 190 lb (86.2 kg)  06/18/18 192 lb 3.9 oz (87.2 kg)  05/17/18 187 lb 4.5 oz (84.9 kg)    BP 117/69    Ht 5\' 6"  (1.676 m)    Wt 190 lb (86.2 kg)    BMI 30.67 kg/m   Assessment and Plan:  1. Fall, initial encounter New onset this morning patient had a fall resulting from an attack of labyrinthitis with vertigo.  Patient landed on her knee and foot with some bruising but no sustained pain.  We will keep an eye on this and use Tylenol on a as needed basis.  2. Anxiety Has had a flareup of her panic disorder which is associated with her vertigo from the chemotherapy and recent cancer diagnosis.  Patient will be prescribed alprazolam on a as needed basis at night or as needed during the day.  This will consist of alprazolam 0.25 daily as needed. - ALPRAZolam (XANAX) 0.25 MG tablet; Take 1 tablet (0.25 mg total) by mouth at bedtime as needed. for anxiety  Dispense: 30 tablet; Refill: 5  3. Labyrinthitis, unspecified laterality Patient has a history of vertigo it may be due to her chemotherapy which is what she attributes to or borderline labyrinthitis.  Also patient was probably dehydration to a certain extent because of the lack of fluid intake which exacerbated her labyrinthitis.  Patient was prescribed meclizine 5 mg every 8 hours as needed for vertigo. - meclizine (ANTIVERT) 25 MG tablet; Take 1 tablet (25 mg total) by mouth 3 (three) times daily as needed for dizziness.  Dispense: 30 tablet; Refill: 0  I spent 10 minutes with this patient, More than 50% of that time was spent in voice to voice education, counseling and care coordination.

## 2018-08-18 ENCOUNTER — Other Ambulatory Visit: Payer: BLUE CROSS/BLUE SHIELD

## 2018-08-18 ENCOUNTER — Ambulatory Visit: Payer: BLUE CROSS/BLUE SHIELD | Admitting: Hematology and Oncology

## 2018-08-19 ENCOUNTER — Ambulatory Visit: Payer: BLUE CROSS/BLUE SHIELD | Admitting: Hematology and Oncology

## 2018-08-19 MED FILL — TAGRISSO 80 MG TABLET: 80 | 15 days supply | Qty: 15 | Fill #4

## 2018-08-22 ENCOUNTER — Other Ambulatory Visit: Payer: Self-pay

## 2018-08-22 DIAGNOSIS — F419 Anxiety disorder, unspecified: Secondary | ICD-10-CM | POA: Insufficient documentation

## 2018-08-23 ENCOUNTER — Ambulatory Visit
Admission: RE | Admit: 2018-08-23 | Discharge: 2018-08-23 | Disposition: A | Discharge status: Home / Self Care | Payer: BLUE CROSS/BLUE SHIELD | Source: Ambulatory Visit | Attending: Urgent Care | Admitting: Urgent Care

## 2018-08-23 ENCOUNTER — Other Ambulatory Visit: Payer: Self-pay

## 2018-08-23 ENCOUNTER — Inpatient Hospital Stay: Payer: BLUE CROSS/BLUE SHIELD | Attending: Hematology and Oncology

## 2018-08-23 DIAGNOSIS — C3490 Malignant neoplasm of unspecified part of unspecified bronchus or lung: Secondary | ICD-10-CM

## 2018-08-23 DIAGNOSIS — E860 Dehydration: Secondary | ICD-10-CM | POA: Insufficient documentation

## 2018-08-23 DIAGNOSIS — Z79899 Other long term (current) drug therapy: Secondary | ICD-10-CM | POA: Diagnosis not present

## 2018-08-23 DIAGNOSIS — Z7982 Long term (current) use of aspirin: Secondary | ICD-10-CM | POA: Insufficient documentation

## 2018-08-23 DIAGNOSIS — F329 Major depressive disorder, single episode, unspecified: Secondary | ICD-10-CM | POA: Insufficient documentation

## 2018-08-23 DIAGNOSIS — Z8541 Personal history of malignant neoplasm of cervix uteri: Secondary | ICD-10-CM | POA: Diagnosis not present

## 2018-08-23 DIAGNOSIS — Z87891 Personal history of nicotine dependence: Secondary | ICD-10-CM | POA: Insufficient documentation

## 2018-08-23 DIAGNOSIS — I10 Essential (primary) hypertension: Secondary | ICD-10-CM | POA: Insufficient documentation

## 2018-08-23 DIAGNOSIS — F419 Anxiety disorder, unspecified: Secondary | ICD-10-CM | POA: Diagnosis not present

## 2018-08-23 LAB — COMPREHENSIVE METABOLIC PANEL
ALT: 40 U/L (ref 0–44)
AST: 31 U/L (ref 15–41)
Albumin: 3.6 g/dL (ref 3.5–5.0)
Alkaline Phosphatase: 66 U/L (ref 38–126)
Anion gap: 7 (ref 5–15)
BUN: 16 mg/dL (ref 6–20)
CO2: 25 mmol/L (ref 22–32)
Calcium: 8.6 mg/dL — ABNORMAL LOW (ref 8.9–10.3)
Chloride: 104 mmol/L (ref 98–111)
Creatinine, Ser: 0.94 mg/dL (ref 0.44–1.00)
GFR calc Af Amer: >60 mL/min (ref >60)
GFR calc non Af Amer: >60 mL/min (ref >60)
Glucose, Bld: 100 mg/dL — ABNORMAL HIGH (ref 70–99)
Potassium: 4.1 mmol/L (ref 3.5–5.1)
Sodium: 136 mmol/L (ref 135–145)
Total Bilirubin: 0.7 mg/dL (ref 0.3–1.2)
Total Protein: 6.8 g/dL (ref 6.5–8.1)

## 2018-08-23 LAB — CBC WITH DIFFERENTIAL/PLATELET
Abs Immature Granulocytes: 0.03 10*3/uL (ref 0.00–0.07)
Basophils Absolute: 0.1 10*3/uL (ref 0.0–0.1)
Basophils Relative: 1 %
Eosinophils Absolute: 0.1 10*3/uL (ref 0.0–0.5)
Eosinophils Relative: 2 %
HCT: 39.7 % (ref 36.0–46.0)
Hemoglobin: 13 g/dL (ref 12.0–15.0)
Immature Granulocytes: 0 %
Lymphocytes Relative: 35 %
Lymphs Abs: 2.6 10*3/uL (ref 0.7–4.0)
MCH: 31.1 pg (ref 26.0–34.0)
MCHC: 32.7 g/dL (ref 30.0–36.0)
MCV: 95 fL (ref 80.0–100.0)
Monocytes Absolute: 0.6 10*3/uL (ref 0.1–1.0)
Monocytes Relative: 8 %
Neutro Abs: 4.1 10*3/uL (ref 1.7–7.7)
Neutrophils Relative %: 54 %
Platelets: 215 10*3/uL (ref 150–400)
RBC: 4.18 MIL/uL (ref 3.87–5.11)
RDW: 13 % (ref 11.5–15.5)
WBC: 7.4 10*3/uL (ref 4.0–10.5)
nRBC: 0 % (ref 0.0–0.2)

## 2018-08-23 MED ORDER — SODIUM CHLORIDE 0.9% FLUSH
10.0000 mL | INTRAVENOUS | Status: DC | PRN
  Administered 2018-08-23: 09:00:00 10 mL via INTRAVENOUS
  Filled 2018-08-23: qty 10

## 2018-08-23 MED ORDER — HEPARIN SOD (PORK) LOCK FLUSH 100 UNIT/ML IV SOLN
500.0000 [IU] | Freq: Once | INTRAVENOUS | Status: AC
  Administered 2018-08-23: 09:00:00 500 [IU] via INTRAVENOUS
  Filled 2018-08-23: qty 5

## 2018-08-23 MED ORDER — IOHEXOL 300 MG/ML  SOLN
75.0000 mL | Freq: Once | INTRAMUSCULAR | Status: AC | PRN
  Administered 2018-08-23: 09:00:00 75 mL via INTRAVENOUS

## 2018-08-24 ENCOUNTER — Telehealth: Payer: Self-pay | Admitting: Hematology and Oncology

## 2018-08-24 NOTE — Progress Notes (Signed)
[facility]  [address] [place], [place] [address] Phone: [phone]  Fax: [phone]   Telemedicine Office Visit:  08/25/2018  Referring physician: Juline Patch, MD  I connected with [name] on 08/25/2018 at 1:24 PM by videoconferencing and verified that I was speaking with the correct person using 2 identifiers.  The patient was at home.  I discussed the limitations, risk, security and privacy concerns of performing an evaluation and management service by videoconferencing and the availability of in person appointments.  I also discussed with the patient that there may be a patient responsible charge related to this service.  The patient expressed understanding and agreed to proceed.   Chief Complaint: Kristina Wright is a 63 y.o. female with stage IV adenocarcinoma of the lung on osimertinib who is seen for 2 month assessment.  HPI: The patient was last seen in the oncology clinic on 06/18/2018.  At that time, she felt anxious. She denied any chest pain, shortness of breath or cough.  Exam was unremarkable.  Echo on 07/27/2018 showed an ejection fraction in the range of 60-65%.   Chest CT on 08/23/2018 revealed stable 10 mm left lower lobe pulmonary nodule. Tree-in-bud nodularity seen medial right upper lobe on the previous study was likely improved, but was obscured by breathing motion.  There was a stable 14 mm left breast nodule.  CBC on 08/23/2018: WBC 7,400 (ANC 4100), hemoglobin 13.0, hematocrit 39.7, platelets 215,000. Calcium was 8.6.   During the interim, the patient states "I'm doing pretty good." She reports dizziness and vertigo last week upon ambulation and while laying down. She has been taking Antivert 3x daily last week, 1-2x daily this week, as prescribed by her PCP Dr. Ronnald Ramp. She reports she was very dehydrated.  She denies any URI, congestion, or fever. She denies any palpitations. Her anxiety is under control and she is  using <1 Xanax daily.   Her last ECG was on 01/07/2018 showing NSR without ectopy; QTc of 423 msec.  She estimates her last Pap smear was about 10 years ago. She will follow-up with Dr. Ronnald Ramp for a Pap smear.   She is interested in having her port removed later this year.    Past Medical History:  Diagnosis Date  . Cancer ([place]) 1989   cervical  . Depression   . Family history of breast cancer   . Family history of colon cancer   . Family history of ovarian cancer   . Family history of stomach cancer   . Family history of throat cancer   . Hyperlipidemia   . Hypertension   . Malignant neoplasm of lung ([place]) 03/01/2018  . Thyroid disease     Past Surgical History:  Procedure Laterality Date  . BILATERAL CARPAL TUNNEL RELEASE    . BREAST CYST ASPIRATION Left   . LUNG BIOPSY    . PARTIAL HYSTERECTOMY    . PORTA CATH INSERTION N/A 03/03/2018   Procedure: PORTA CATH INSERTION;  Surgeon: Katha Cabal, MD;  Location: [facility];  Service: Cardiovascular;  Laterality: N/A;  . TUBAL LIGATION      Family History  Problem Relation Age of Onset  . Colon cancer Mother 42  . Colon cancer Maternal Grandmother 78  . Hypertension Father   . Breast cancer Sister 75  . Throat cancer Brother   . Heart attack Maternal Aunt   . Ovarian cancer Maternal Aunt 70  . Stomach cancer Paternal  Grandmother        dx >50  . Throat cancer Brother     Social History:  reports that she quit smoking about 9 months ago. Her smoking use included cigarettes. She has a 25.00 pack-year smoking history. She has never used smokeless tobacco. She reports current drug use. Drug: Marijuana. She reports that she does not drink alcohol. Shehas a Administrator, arts.  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 Her ex-husband's name is Kristina Wright (accompanied her on her initial visit). The patient's son is named Kristina Wright.The patient is alone today.   Participants in the patient's visit and their role in the encounter included the patient and Waymon Budge, RN today.  The intake visit was provided by Waymon Budge, RN.  Allergies: No Known Allergies  Current Medications: Current Outpatient Medications  Medication Sig Dispense Refill  . ALPRAZolam (XANAX) 0.25 MG tablet Take 1 tablet (0.25 mg total) by mouth at bedtime as needed. for anxiety 30 tablet 5  . aspirin EC 81 MG tablet Take 81 mg by mouth daily.    Marland Kitchen atorvastatin (LIPITOR) 10 MG tablet Take 1 tablet (10 mg total) by mouth daily. 90 tablet 1  . busPIRone (BUSPAR) 5 MG tablet Take 1 tablet (5 mg total) by mouth daily. 90 tablet 1  . clopidogrel (PLAVIX) 75 MG tablet Take 1 tablet (75 mg total) by mouth daily. 90 tablet 1  . FLUoxetine (PROZAC) 10 MG capsule Take 1 capsule (10 mg total) by mouth daily. 90 capsule 1  . levothyroxine (SYNTHROID, LEVOTHROID) 75 MCG tablet Take 1 tablet (75 mcg total) by mouth daily. 90 tablet 1  . lidocaine-prilocaine (EMLA) cream Apply to affected area once 30 g 3  . lisinopril-hydrochlorothiazide (PRINZIDE,ZESTORETIC) 10-12.5 MG tablet Take 1 tablet by mouth daily. 90 tablet 1  . meclizine (ANTIVERT) 25 MG tablet Take 1 tablet (25 mg total) by mouth 3 (three) times daily as needed for dizziness. 30 tablet 0  . Multiple Vitamins-Minerals (MULTIVITAMIN ADULT PO) Take 1 tablet by mouth daily.     . ondansetron (ZOFRAN) 8 MG tablet Take 1 tablet (8 mg total) by mouth 2 (two) times daily as needed (Nausea or vomiting). Start if needed on the third day after chemotherapy. 30 tablet 1  . TAGRISSO 80 MG tablet TAKE 1 TABLET (80 MG TOTAL) BY MOUTH DAILY. 30 tablet 2  . albuterol (PROVENTIL HFA;VENTOLIN HFA) 108 (90 Base) MCG/ACT inhaler Inhale 2 puffs into the lungs every 6 (six) hours as needed for wheezing or shortness of breath. (Patient not taking: Reported on 08/16/2018) 1 Inhaler 0  . prochlorperazine (COMPAZINE) 10 MG tablet Take 1 tablet (10 mg  total) by mouth every 6 (six) hours as needed (Nausea or vomiting). (Patient not taking: Reported on 06/18/2018) 30 tablet 1   No current facility-administered medications for this visit.     Review of Systems  Constitutional: Negative.  Negative for chills, diaphoresis, fever, malaise/fatigue and weight loss (stable).       Feels "pretty good".  HENT: Negative for congestion, ear pain, hearing loss, nosebleeds, sinus pain and sore throat.   Eyes: Negative.  Negative for blurred vision, double vision and photophobia.  Respiratory: Negative.  Negative for cough, sputum production, shortness of breath and wheezing.   Cardiovascular: Negative.  Negative for chest pain, palpitations, leg swelling and PND.  Gastrointestinal: Negative for abdominal pain, blood in stool, constipation, diarrhea, heartburn, melena, nausea and vomiting.       BMs 1-2x daily  Genitourinary: Negative.  Negative for dysuria, frequency, hematuria and urgency.  Musculoskeletal: Negative.  Negative for back pain, joint pain and myalgias.  Skin: Negative.  Negative for itching and rash.  Neurological: Positive for dizziness. Negative for tingling, sensory change, focal weakness, weakness and headaches.  Endo/Heme/Allergies: Does not bruise/bleed easily.       Thyroid disease on Synthroid.  Psychiatric/Behavioral: Negative for depression and memory loss. The patient is nervous/anxious (takes < 1 Xanax). The patient does not have insomnia.        Paranoid.    Performance status (ECOG):  1  Physical Exam  Constitutional: She is oriented to person, place, and time. She appears well-developed and well-nourished. No distress.  HENT:  Shoulder length dark hair  Eyes: Conjunctivae and EOM are normal. No scleral icterus.  Hazel eyes.  Glasses  Neurological: She is alert and oriented to person, place, and time.  Skin: She is not diaphoretic.  Psychiatric: She has a normal mood and affect. Her behavior is normal. Judgment and  thought content normal.  Nursing note reviewed.    Infusion on 08/23/2018  Component Date Value Ref Range Status  . Sodium 08/23/2018 136  135 - 145 mmol/L Final  . Potassium 08/23/2018 4.1  3.5 - 5.1 mmol/L Final  . Chloride 08/23/2018 104  98 - 111 mmol/L Final  . CO2 08/23/2018 25  22 - 32 mmol/L Final  . Glucose, Bld 08/23/2018 100* 70 - 99 mg/dL Final  . BUN 08/23/2018 16  6 - 20 mg/dL Final  . Creatinine, Ser 08/23/2018 0.94  0.44 - 1.00 mg/dL Final  . Calcium 08/23/2018 8.6* 8.9 - 10.3 mg/dL Final  . Total Protein 08/23/2018 6.8  6.5 - 8.1 g/dL Final  . Albumin 08/23/2018 3.6  3.5 - 5.0 g/dL Final  . AST 08/23/2018 31  15 - 41 U/L Final  . ALT 08/23/2018 40  0 - 44 U/L Final  . Alkaline Phosphatase 08/23/2018 66  38 - 126 U/L Final  . Total Bilirubin 08/23/2018 0.7  0.3 - 1.2 mg/dL Final  . GFR calc non Af Amer 08/23/2018 >60  >60 mL/min Final  . GFR calc Af Amer 08/23/2018 >60  >60 mL/min Final  . Anion gap 08/23/2018 7  5 - 15 Final   Performed at [facility], [address] [place], [place] [address]  . WBC 08/23/2018 7.4  4.0 - 10.5 K/uL Final  . RBC 08/23/2018 4.18  3.87 - 5.11 MIL/uL Final  . Hemoglobin 08/23/2018 13.0  12.0 - 15.0 g/dL Final  . HCT 08/23/2018 39.7  36.0 - 46.0 % Final  . MCV 08/23/2018 95.0  80.0 - 100.0 fL Final  . MCH 08/23/2018 31.1  26.0 - 34.0 pg Final  . MCHC 08/23/2018 32.7  30.0 - 36.0 g/dL Final  . RDW 08/23/2018 13.0  11.5 - 15.5 % Final  . Platelets 08/23/2018 215  150 - 400 K/uL Final  . nRBC 08/23/2018 0.0  0.0 - 0.2 % Final  . Neutrophils Relative % 08/23/2018 54  % Final  . Neutro Abs 08/23/2018 4.1  1.7 - 7.7 K/uL Final  . Lymphocytes Relative 08/23/2018 35  % Final  . Lymphs Abs 08/23/2018 2.6  0.7 - 4.0 K/uL Final  . Monocytes Relative 08/23/2018 8  % Final  . Monocytes Absolute 08/23/2018 0.6  0.1 - 1.0 K/uL Final  . Eosinophils Relative 08/23/2018 2  % Final  . Eosinophils Absolute 08/23/2018 0.1  0.0 - 0.5  K/uL Final  .  Basophils Relative 08/23/2018 1  % Final  . Basophils Absolute 08/23/2018 0.1  0.0 - 0.1 K/uL Final  . Immature Granulocytes 08/23/2018 0  % Final  . Abs Immature Granulocytes 08/23/2018 0.03  0.00 - 0.07 K/uL Final   Performed at [facility], [address] [place], [place] [address]    Assessment:  CLOTINE HEINER is a 63 y.o. female with stage IV adenocarcinoma of the lung s/p right axillary biopsy on 02/24/2018.  Pathology revealed metastatic adenocarcinoma c/w lung primary.  Tumor cells were positive for CK7 and TTF-1 with minimal blush reactivity for CK20.  Tumor cells were negative for GATA3, ER, and PR.  Omniseq testing revealed EGFR c.2582>A (L861Q) mutation and PDL-1 60% TPS.  CEA was 2.9 on 05/17/2018.  Chest CT 01/19/2018 revealed a 8.7 x 5.8 mm slightly irregular pulmonary nodule within the anterior left lower lobe. There are no other concerning pulmonary nodules. There was a focal 3.1 mm x 9.2 mm pleural nodule at the right middle lobe minor fissure felt likely a benign linear scar or lymphoid tissue of the pleura.  There was mediastinal adenopathy including a 1.7 x 3.7 cm precarinal lymph node mass and a 1.7 cm right hilar lymph node.  PET scan on 02/09/2018 revealed an unusual pattern of nodal metastasis with intensely hypermetabolic enlarged precarinal lymph node, RIGHT hilar lymph node and RIGHT axillary lymph node.  Recommend biopsy of the RIGHT axillary lymph node.  There was no metabolic activity associated with the RIGHT lobe pulmonary nodule.  There was no additional evidence primary or metastatic carcinoma.  Clinical stage [id number].  Chest CT on 06/17/2018 revealed marked interval reduction of mediastinal and right hilar lymphadenopathy. There was no new lymphadenopathy in the chest.  There was slight increase in the left lower lobe pulmonary nodule (8 mm to 10 mm).  This was not hypermetabolic on the previous PET-CT.  There was interval  development of a 7 mm nodule in the medial right upper lobe in a background of small airway impaction and subtle tree-in-bud nodularity.   CT Chest on 08/23/2018 revealed stable 10 mm left lower lobe pulmonary nodule. Tree-in-bud nodularity seen medial right upper lobe on the previous study was likely improved, but was obscured by breathing motion. There was a stable 14 mm left breast nodule.  Head MRI on 03/10/2018 revealed no evidence of metastatic disease.  She began osimertinib on 03/25/2018.  Echo on 07/27/2018 showed an ejection fraction in the range of 60-65%.  She is tolerating treatment well.  She has a history of cervical cancerin 1989. She underwent cryotherapy.  Symptomatically, she is doing well.  She describes some issues with dizziness which have resolved.  Dizziness may have been secondary to dehydration.  She has tapered her meclizine.  Plan: 1.  Review labs from 08/23/2018. 2.  Stage IV adenocarcinoma of the Lung (EGFR L861Q mutation)             Clinically, she is doing well.             Discuss chest CT from 08/23/2018.  Images personally reviewed.  Agree with radiology interpretation.                         LLL 10 mm nodule is stable.  Nodularity RUL improved             Tolerating osimertinib well.  EKG at last visit revealed no increased QTc.  Baseline echo revealed  a normal EF.  Follow-up EKG periodically.  Follow-up echo if any concern for cardiac symptoms. 3.   Anxiety             Patient has decreased use of Xanax.  Follow-up with Dr. Ronnald Ramp. 4.   Vertigo  Etiology unclear.  Patient relates to dehydration.  She appears to be tapering off her meclizine.  Doubt related to osimertinib.  Discuss repeat EKG. 5.   Port-a-cath  Continue port flushes every 8-12 weeks (during COVID-19 pandemic).  Discuss port-a-cath removal 6.   Breast nodule  Chest CT reveals a 14 mm left breast nodule.  Prior mammogram and ultrasound revealed abnormalities on the right.   Review imaging with radiology.  Discuss possible left mammogram and ultrasound. 7.   EKG on 08/31/2018 (when patient can return). 8.   RTC in 8 weeks for MD assessment, labs (CBC with diff, CMP, Mg), and port flush.  I discussed the assessment and treatment plan with the patient.  The patient was provided an opportunity to ask questions and all were answered.  The patient agreed with the plan and demonstrated an understanding of the instructions.  The patient was advised to call back or seek an in person evaluation if the symptoms worsen or if the condition fails to improve as anticipated.  I provided 24 minutes (1:24 PM - 1:48 PM) of face-to-face video visit time during this this encounter and > 50% was spent counseling as documented under my assessment and plan.  I provided these services from the [facility] office.   Nolon Stalls, MD, PhD  08/25/2018, 1:24 PM  I, Molly Dorshimer, am acting as Education administrator for [employer]. Mike Gip, MD, PhD.  I, Melissa C. Mike Gip, MD, have reviewed the above documentation for accuracy and completeness, and I agree with the above.

## 2018-08-25 ENCOUNTER — Inpatient Hospital Stay (HOSPITAL_BASED_OUTPATIENT_CLINIC_OR_DEPARTMENT_OTHER): Payer: BLUE CROSS/BLUE SHIELD | Admitting: Hematology and Oncology

## 2018-08-25 ENCOUNTER — Encounter: Payer: Self-pay | Admitting: Hematology and Oncology

## 2018-08-25 DIAGNOSIS — N63 Unspecified lump in unspecified breast: Secondary | ICD-10-CM | POA: Diagnosis not present

## 2018-08-25 DIAGNOSIS — I10 Essential (primary) hypertension: Secondary | ICD-10-CM | POA: Diagnosis not present

## 2018-08-25 DIAGNOSIS — C3490 Malignant neoplasm of unspecified part of unspecified bronchus or lung: Secondary | ICD-10-CM | POA: Diagnosis not present

## 2018-08-25 DIAGNOSIS — Z7982 Long term (current) use of aspirin: Secondary | ICD-10-CM

## 2018-08-25 DIAGNOSIS — Z800 Family history of malignant neoplasm of digestive organs: Secondary | ICD-10-CM

## 2018-08-25 DIAGNOSIS — Z803 Family history of malignant neoplasm of breast: Secondary | ICD-10-CM

## 2018-08-25 DIAGNOSIS — Z79899 Other long term (current) drug therapy: Secondary | ICD-10-CM

## 2018-08-25 DIAGNOSIS — Z8041 Family history of malignant neoplasm of ovary: Secondary | ICD-10-CM

## 2018-08-25 NOTE — Progress Notes (Signed)
Confirmed name, DOB, and address. Reports dizziness started last Monday. States she was advised by PCP that she was having vertigo d/t Tagrisso. Patient reports she is much better now. Denies any other concerns.

## 2018-08-26 MED FILL — CLOPIDOGREL 75 MG TABLET: 75 | 90 days supply | Qty: 90 | Fill #0

## 2018-08-31 ENCOUNTER — Inpatient Hospital Stay: Payer: BLUE CROSS/BLUE SHIELD | Admitting: Hematology and Oncology

## 2018-08-31 ENCOUNTER — Other Ambulatory Visit: Payer: Self-pay

## 2018-08-31 ENCOUNTER — Telehealth: Payer: Self-pay

## 2018-08-31 DIAGNOSIS — I4581 Long QT syndrome: Secondary | ICD-10-CM | POA: Diagnosis not present

## 2018-08-31 NOTE — Progress Notes (Deleted)
EKG completed. Dr. Mike Gip reviewed.

## 2018-08-31 NOTE — Telephone Encounter (Signed)
EKG Completed. Dr. Mike Gip reviewed and signed. Copy to be scanned into chart.

## 2018-09-03 ENCOUNTER — Encounter: Payer: Self-pay | Admitting: Hematology and Oncology

## 2018-09-07 MED FILL — TAGRISSO 80 MG TABLET: 80 | 15 days supply | Qty: 15 | Fill #5

## 2018-09-20 ENCOUNTER — Other Ambulatory Visit: Payer: Self-pay

## 2018-09-20 DIAGNOSIS — C7800 Secondary malignant neoplasm of unspecified lung: Secondary | ICD-10-CM

## 2018-09-20 MED ORDER — OSIMERTINIB MESYLATE 80 MG PO TABS: ORAL_TABLET | ORAL | 2 refills | Status: DC

## 2018-09-20 MED FILL — TAGRISSO 80 MG TABLET: 80 | 30 days supply | Qty: 30 | Fill #0

## 2018-09-22 ENCOUNTER — Other Ambulatory Visit: Payer: Self-pay | Admitting: Family Medicine

## 2018-09-22 DIAGNOSIS — J441 Chronic obstructive pulmonary disease with (acute) exacerbation: Secondary | ICD-10-CM

## 2018-09-23 ENCOUNTER — Telehealth: Payer: Self-pay | Admitting: Pharmacist

## 2018-09-27 NOTE — Telephone Encounter (Signed)
Oral Chemotherapy Pharmacist Encounter   Ms. Noto called because she had a question about sun exposure with Tagrisso. She has been spending some time in the sun recently and wasn't sure if she should be worried because she is on Tagrisso.  Reviewed with Ms. Miao that sunlight may worsen rash or dry/itchy skin. She should limit or avoid sun exposure as much as possible. Avoid the times of highest UV radiation, 10am-4pm when possible. Consider wearing a hat and long sleeve shirts with UV protection. She should also apply sunscreen with at least SPF 30 as often as directed on the bottle. She stated her understanding.   Darl Pikes, PharmD, BCPS, [facility] Hematology/Oncology Clinical Pharmacist [facility]/HP/AP [facility] [phone]  09/27/2018 11:17 AM

## 2018-10-18 MED FILL — TAGRISSO 80 MG TABLET: 80 | 30 days supply | Qty: 30 | Fill #1

## 2018-10-20 ENCOUNTER — Other Ambulatory Visit: Payer: Self-pay

## 2018-10-20 DIAGNOSIS — C3490 Malignant neoplasm of unspecified part of unspecified bronchus or lung: Secondary | ICD-10-CM

## 2018-10-20 NOTE — Progress Notes (Signed)
[facility]  [address], [address] [place], [place] [address] Phone: [phone]  Fax: [phone]   Clinic Day:  10/21/2018  Referring physician: Juline Patch, MD  Chief Complaint: Kristina Wright is a 63 y.o. female with stage IV adenocarcinoma of the lung on osimertinib who is seen for 1 month assessment.   HPI: The patient was last seen in the medical oncology clinic on 08/25/2018.  At that time, she was doing well. She described some issues with dizziness which had resolved. Dizziness may have been secondary to dehydration. She had tapered her meclizine.   During the interim, the patient "I'm doing good". She notes shortness of breath, and having to use her inhaler daily. She feels like she can not catch her breath. She denies any allergies or asthma during this time of the year. She reports tolerating medications with no complications. Her weight is up 12 lbs.  She reports feeling down sometimes and is interested in obtaining a disk of all her images. She notes that it would cheer her up.   She is interested in seeing a pulmonologist.    Past Medical History:  Diagnosis Date  . Cancer ([place]) 1989   cervical  . Depression   . Family history of breast cancer   . Family history of colon cancer   . Family history of ovarian cancer   . Family history of stomach cancer   . Family history of throat cancer   . Hyperlipidemia   . Hypertension   . Malignant neoplasm of lung ([place]) 03/01/2018  . Thyroid disease     Past Surgical History:  Procedure Laterality Date  . BILATERAL CARPAL TUNNEL RELEASE    . BREAST CYST ASPIRATION Left   . LUNG BIOPSY    . PARTIAL HYSTERECTOMY    . PORTA CATH INSERTION N/A 03/03/2018   Procedure: PORTA CATH INSERTION;  Surgeon: Katha Cabal, MD;  Location: [facility];  Service: Cardiovascular;  Laterality: N/A;  . TUBAL LIGATION      Family History  Problem Relation Age of Onset  . Colon cancer Mother  16  . Colon cancer Maternal Grandmother 78  . Hypertension Father   . Breast cancer Sister 62  . Throat cancer Brother   . Heart attack Maternal Aunt   . Ovarian cancer Maternal Aunt 70  . Stomach cancer Paternal Grandmother        dx >50  . Throat cancer Brother     Social History:  reports that she quit smoking about 11 months ago. Her smoking use included cigarettes. She has a 25.00 pack-year smoking history. She has never used smokeless tobacco. She reports current drug use. Drug: Marijuana. She reports that she does not drink alcohol. 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 The patient is alone today.  Allergies: No Known Allergies  Current Medications: Current Outpatient Medications  Medication Sig Dispense Refill  . ALPRAZolam (XANAX) 0.25 MG tablet Take 1 tablet (0.25 mg total) by mouth at bedtime as needed. for anxiety 30 tablet 5  . aspirin EC 81 MG tablet Take 81 mg by mouth daily.    Marland Kitchen atorvastatin (LIPITOR) 10 MG tablet Take 1 tablet (10 mg total) by mouth daily. 90 tablet 1  . busPIRone (BUSPAR) 5 MG tablet Take 1 tablet (5 mg total) by mouth daily. 90 tablet 1  .  clopidogrel (PLAVIX) 75 MG tablet Take 1 tablet (75 mg total) by mouth daily. 90 tablet 1  . FLUoxetine (PROZAC) 10 MG capsule Take 1 capsule (10 mg total) by mouth daily. 90 capsule 1  . levothyroxine (SYNTHROID, LEVOTHROID) 75 MCG tablet Take 1 tablet (75 mcg total) by mouth daily. 90 tablet 1  . lidocaine-prilocaine (EMLA) cream Apply to affected area once 30 g 3  . lisinopril-hydrochlorothiazide (PRINZIDE,ZESTORETIC) 10-12.5 MG tablet Take 1 tablet by mouth daily. 90 tablet 1  . meclizine (ANTIVERT) 25 MG tablet Take 1 tablet (25 mg total) by mouth 3 (three) times daily as needed for dizziness. 30 tablet 0  . Multiple  Vitamins-Minerals (MULTIVITAMIN ADULT PO) Take 1 tablet by mouth daily.     Marland Kitchen osimertinib mesylate (TAGRISSO) 80 MG tablet TAKE 1 TABLET (80 MG TOTAL) BY MOUTH DAILY. 30 tablet 2  . VENTOLIN HFA 108 (90 Base) MCG/ACT inhaler INHALE 2 PUFFS BY MOUTH EVERY 6 HOURS AS NEEDED FOR WHEEZING FOR SHORTNESS OF BREATH 18 g 0  . ondansetron (ZOFRAN) 8 MG tablet Take 1 tablet (8 mg total) by mouth 2 (two) times daily as needed (Nausea or vomiting). Start if needed on the third day after chemotherapy. (Patient not taking: Reported on 10/21/2018) 30 tablet 1  . prochlorperazine (COMPAZINE) 10 MG tablet Take 1 tablet (10 mg total) by mouth every 6 (six) hours as needed (Nausea or vomiting). (Patient not taking: Reported on 06/18/2018) 30 tablet 1   No current facility-administered medications for this visit.    Facility-Administered Medications Ordered in Other Visits  Medication Dose Route Frequency Provider Last Rate Last Dose  . sodium chloride flush (NS) 0.9 % injection 10 mL  10 mL Intravenous PRN Nolon Stalls C, MD   10 mL at 10/21/18 1100    Review of Systems  Constitutional: Negative for chills, diaphoresis, fever and malaise/fatigue. Weight loss: up 12 lbs.        "I'm doing good".  HENT: Negative.  Negative for congestion, ear pain, hearing loss, nosebleeds, sinus pain and sore throat.   Eyes: Negative.  Negative for blurred vision, double vision and photophobia.  Respiratory: Positive for shortness of breath (uses inhaler daily). Negative for cough, sputum production and wheezing.   Cardiovascular: Negative.  Negative for chest pain, palpitations, leg swelling and PND.  Gastrointestinal: Negative.  Negative for abdominal pain, blood in stool, constipation, diarrhea, heartburn, melena, nausea and vomiting.       BMs 1-2x daily.  Genitourinary: Negative.  Negative for dysuria, frequency, hematuria and urgency.  Musculoskeletal: Negative.  Negative for back pain, joint pain and myalgias.  Skin:  Negative.  Negative for itching and rash.  Neurological: Negative.  Negative for dizziness, tingling, sensory change, focal weakness, weakness and headaches.  Endo/Heme/Allergies: Does not bruise/bleed easily.       Thyroid disease on Synthroid.  Psychiatric/Behavioral: Negative for depression, hallucinations and memory loss. The patient is nervous/anxious (takes Xanax). The patient does not have insomnia.   All other systems reviewed and are negative.   Performance status (ECOG): 1  Vital Signs Blood pressure (!) 164/94, pulse 78, temperature 98 F (36.7 C), temperature source Tympanic, resp. rate 18, weight 202 lb 4.4 oz (91.7 kg), SpO2 99 %.   Physical Exam  Constitutional: She is oriented to person, place, and time. She appears well-developed and well-nourished. No distress.  HENT:  Head: Normocephalic and atraumatic.  Mouth/Throat: Oropharynx is clear and moist. No oropharyngeal exudate.  Shoulder length dark hair  Eyes: Pupils  are equal, round, and reactive to light. Conjunctivae and EOM are normal. No scleral icterus.  Hazel eyes.  Glasses  Neck: Normal range of motion. Neck supple.  Cardiovascular: Normal rate, regular rhythm and normal heart sounds. Exam reveals no gallop and no friction rub.  No murmur heard. Pulmonary/Chest: Effort normal and breath sounds normal. No respiratory distress. She has no wheezes. She has no rales. She exhibits no tenderness.  Abdominal: Soft. Bowel sounds are normal. She exhibits no distension and no mass. There is no abdominal tenderness. There is no rebound and no guarding.  Musculoskeletal: Normal range of motion.        General: No tenderness or edema.  Lymphadenopathy:    She has no cervical adenopathy.    She has no axillary adenopathy.       Right: No supraclavicular adenopathy present.       Left: No supraclavicular adenopathy present.  Neurological: She is alert and oriented to person, place, and time. She has normal reflexes.  Skin:  Skin is warm and dry. No rash noted. She is not diaphoretic. No erythema. No pallor.  Psychiatric: She has a normal mood and affect. Her behavior is normal. Judgment and thought content normal.  Nursing note and vitals reviewed.   Infusion on 10/21/2018  Component Date Value Ref Range Status  . Sodium 10/21/2018 136  135 - 145 mmol/L Final  . Potassium 10/21/2018 3.8  3.5 - 5.1 mmol/L Final  . Chloride 10/21/2018 103  98 - 111 mmol/L Final  . CO2 10/21/2018 24  22 - 32 mmol/L Final  . Glucose, Bld 10/21/2018 96  70 - 99 mg/dL Final  . BUN 10/21/2018 11  6 - 20 mg/dL Final  . Creatinine, Ser 10/21/2018 0.93  0.44 - 1.00 mg/dL Final  . Calcium 10/21/2018 8.7* 8.9 - 10.3 mg/dL Final  . Total Protein 10/21/2018 7.1  6.5 - 8.1 g/dL Final  . Albumin 10/21/2018 3.8  3.5 - 5.0 g/dL Final  . AST 10/21/2018 20  15 - 41 U/L Final  . ALT 10/21/2018 17  0 - 44 U/L Final  . Alkaline Phosphatase 10/21/2018 77  38 - 126 U/L Final  . Total Bilirubin 10/21/2018 0.5  0.3 - 1.2 mg/dL Final  . GFR calc non Af Amer 10/21/2018 >60  >60 mL/min Final  . GFR calc Af Amer 10/21/2018 >60  >60 mL/min Final  . Anion gap 10/21/2018 9  5 - 15 Final   Performed at [facility], [address], [place], [place] [address]  . WBC 10/21/2018 8.0  4.0 - 10.5 K/uL Final  . RBC 10/21/2018 4.43  3.87 - 5.11 MIL/uL Final  . Hemoglobin 10/21/2018 14.0  12.0 - 15.0 g/dL Final  . HCT 10/21/2018 41.5  36.0 - 46.0 % Final  . MCV 10/21/2018 93.7  80.0 - 100.0 fL Final  . MCH 10/21/2018 31.6  26.0 - 34.0 pg Final  . MCHC 10/21/2018 33.7  30.0 - 36.0 g/dL Final  . RDW 10/21/2018 12.5  11.5 - 15.5 % Final  . Platelets 10/21/2018 237  150 - 400 K/uL Final  . nRBC 10/21/2018 0.0  0.0 - 0.2 % Final  . Neutrophils Relative % 10/21/2018 59  % Final  . Neutro Abs 10/21/2018 4.8  1.7 - 7.7 K/uL Final  . Lymphocytes Relative 10/21/2018 32  % Final  . Lymphs Abs 10/21/2018 2.5  0.7 - 4.0 K/uL Final  . Monocytes Relative  10/21/2018 6  % Final  .  Monocytes Absolute 10/21/2018 0.5  0.1 - 1.0 K/uL Final  . Eosinophils Relative 10/21/2018 2  % Final  . Eosinophils Absolute 10/21/2018 0.1  0.0 - 0.5 K/uL Final  . Basophils Relative 10/21/2018 1  % Final  . Basophils Absolute 10/21/2018 0.1  0.0 - 0.1 K/uL Final  . Immature Granulocytes 10/21/2018 0  % Final  . Abs Immature Granulocytes 10/21/2018 0.02  0.00 - 0.07 K/uL Final   Performed at [facility], [address] [place], [place] [address]  . Magnesium 10/21/2018 2.3  1.7 - 2.4 mg/dL Final   Performed at [facility], [address], [place], [place] [address]    Assessment:  LINDAMARIE MACLACHLAN is a 63 y.o. female with stage IV adenocarcinoma of the lungs/p right axillary biopsy on 02/24/2018. Pathologyrevealed metastatic adenocarcinoma c/w lung primary. Tumor cells were positive for CK7 and TTF-1 with minimal blush reactivity for CK20. Tumor cells were negative for GATA3, ER, and PR. Omniseq testingrevealed EGFR c.2582>A (L861Q) mutation and PDL-1 60% TPS. CEAwas 2.9 on 05/17/2018.  Chest CT10/04/2017 revealed a 8.7 x 5.8 mm slightly irregular pulmonary nodule within the anterior left lower lobe. There are no other concerning pulmonary nodules. There was a focal 3.1 mm x 9.2 mm pleural nodule at the right middle lobe minor fissure felt likely a benign linear scar or lymphoid tissue of the pleura. There was mediastinal adenopathy including a 1.7 x 3.7 cm precarinal lymph node mass and a 1.7 cm right hilar lymph node.  PET scanon 02/09/2018 revealed an unusual pattern of nodal metastasis with intensely hypermetabolic enlarged precarinal lymph node, RIGHT hilar lymph node and RIGHT axillary lymph node. Recommend biopsy of the RIGHT axillary lymph node. There was no metabolic activity associated with the RIGHT lobe pulmonary nodule. There was no additional evidence primary or metastatic carcinoma. Clinical stageT1N3M1.   Chest CT on 06/17/2018 revealed marked interval reduction of mediastinal and right hilar lymphadenopathy.There was nonew lymphadenopathy in the chest. There was slight increase intheleft lower lobe pulmonary nodule (8 mm to10 mm).This was not hypermetabolic on the previous PET-CT. There was interval development of a 7 mm nodule in the medial right upper lobein a background of small airway impaction and subtle tree-in-bud nodularity.  CT Chest on 08/23/2018 revealed stable 10 mm left lower lobe pulmonary nodule. Tree-in-bud nodularity seen medial right upper lobe on the previous study was likely improved, but was obscured by breathing motion. There was a stable 14 mm left breast nodule.  Head MRIon 03/10/2018 revealed no evidence of metastatic disease.  She began osimertinibon 03/25/2018. Echo on 07/27/2018 showed an ejection fraction in the range of 60-65%.  She is tolerating treatment well.  She has a history of cervical cancerin 1989. She underwent cryotherapy.  Symptomatically, feels good.  She notes some shortness of breath.  Exam is unremarkable.  Plan: 1.   Labs today: CBC with differential, CMP, Mg. 2. Stage IV adenocarcinoma of the Lung (EGFR L861Q mutation) Clinically, she continues to do well.    She is tolerating osimertinib well.    Follow-up EKG periodically.    Follow-up echocardiogram if any concerning symptoms. Discuss plans for repeat CT scan.  If stable/improved, decrease frequency of scans to every 6 months.  Patient would like to have a copy of her scans. 3.Shortness of breath             Etiology unclear.  No clinical concern for pulmonary embolism.  Patient notes no relief with an inhaler.  Pulmonary referral. 4.   Port-a-cath             Continue port flushes every 8-12 weeks during COVID-19 pandemic.             Anticipate port removal in the future 5. Breast nodule             Chest CT on 08/23/2018 revealed a  14 mm left breast nodule.             Diagnostic left mammogram on 11/01/2018 6.   RN to provide patient with phone number to radiology to obtain a disk of scans. 7.   Pulmonary referral. 8.   Chest CT on 11/23/2018. 9.   RTC after chest CT for MD assessment, port flush, labs (CBC with diff, CMP, Mg), and review of imaging  Addendum: Diagnostic left mammogram on 11/01/2018 revealed a benign left breast mass unchanged mammographically since at least 2016.  There were no suspicious findings in the left breast.  I discussed the assessment and treatment plan with the patient.  The patient was provided an opportunity to ask questions and all were answered.  The patient agreed with the plan and demonstrated an understanding of the instructions.  The patient was advised to call back if the symptoms worsen or if the condition fails to improve as anticipated.   Lequita Asal, MD, PhD    10/21/2018, 12:07 PM  I, Selena Batten, am acting as scribe for [employer]. Mike Gip, MD, PhD.  I,  C. Mike Gip, MD, have reviewed the above documentation for accuracy and completeness, and I agree with the above.

## 2018-10-21 ENCOUNTER — Telehealth: Payer: Self-pay

## 2018-10-21 ENCOUNTER — Inpatient Hospital Stay: Payer: BLUE CROSS/BLUE SHIELD

## 2018-10-21 ENCOUNTER — Encounter: Payer: Self-pay | Admitting: Hematology and Oncology

## 2018-10-21 ENCOUNTER — Inpatient Hospital Stay: Payer: BLUE CROSS/BLUE SHIELD | Attending: Hematology and Oncology | Admitting: Hematology and Oncology

## 2018-10-21 VITALS — BP 164/94 | HR 78 | Temp 98.0°F | Resp 18 | Wt 202.3 lb

## 2018-10-21 DIAGNOSIS — R0602 Shortness of breath: Secondary | ICD-10-CM | POA: Diagnosis not present

## 2018-10-21 DIAGNOSIS — N63 Unspecified lump in unspecified breast: Secondary | ICD-10-CM

## 2018-10-21 DIAGNOSIS — C3490 Malignant neoplasm of unspecified part of unspecified bronchus or lung: Secondary | ICD-10-CM | POA: Insufficient documentation

## 2018-10-21 DIAGNOSIS — Z452 Encounter for adjustment and management of vascular access device: Secondary | ICD-10-CM | POA: Insufficient documentation

## 2018-10-21 DIAGNOSIS — C773 Secondary and unspecified malignant neoplasm of axilla and upper limb lymph nodes: Secondary | ICD-10-CM | POA: Insufficient documentation

## 2018-10-21 LAB — COMPREHENSIVE METABOLIC PANEL
ALT: 17 U/L (ref 0–44)
AST: 20 U/L (ref 15–41)
Albumin: 3.8 g/dL (ref 3.5–5.0)
Alkaline Phosphatase: 77 U/L (ref 38–126)
Anion gap: 9 (ref 5–15)
BUN: 11 mg/dL (ref 6–20)
CO2: 24 mmol/L (ref 22–32)
Calcium: 8.7 mg/dL — ABNORMAL LOW (ref 8.9–10.3)
Chloride: 103 mmol/L (ref 98–111)
Creatinine, Ser: 0.93 mg/dL (ref 0.44–1.00)
GFR calc Af Amer: >60 mL/min (ref >60)
GFR calc non Af Amer: >60 mL/min (ref >60)
Glucose, Bld: 96 mg/dL (ref 70–99)
Potassium: 3.8 mmol/L (ref 3.5–5.1)
Sodium: 136 mmol/L (ref 135–145)
Total Bilirubin: 0.5 mg/dL (ref 0.3–1.2)
Total Protein: 7.1 g/dL (ref 6.5–8.1)

## 2018-10-21 LAB — CBC WITH DIFFERENTIAL/PLATELET
Abs Immature Granulocytes: 0.02 10*3/uL (ref 0.00–0.07)
Basophils Absolute: 0.1 10*3/uL (ref 0.0–0.1)
Basophils Relative: 1 %
Eosinophils Absolute: 0.1 10*3/uL (ref 0.0–0.5)
Eosinophils Relative: 2 %
HCT: 41.5 % (ref 36.0–46.0)
Hemoglobin: 14 g/dL (ref 12.0–15.0)
Immature Granulocytes: 0 %
Lymphocytes Relative: 32 %
Lymphs Abs: 2.5 10*3/uL (ref 0.7–4.0)
MCH: 31.6 pg (ref 26.0–34.0)
MCHC: 33.7 g/dL (ref 30.0–36.0)
MCV: 93.7 fL (ref 80.0–100.0)
Monocytes Absolute: 0.5 10*3/uL (ref 0.1–1.0)
Monocytes Relative: 6 %
Neutro Abs: 4.8 10*3/uL (ref 1.7–7.7)
Neutrophils Relative %: 59 %
Platelets: 237 10*3/uL (ref 150–400)
RBC: 4.43 MIL/uL (ref 3.87–5.11)
RDW: 12.5 % (ref 11.5–15.5)
WBC: 8 10*3/uL (ref 4.0–10.5)
nRBC: 0 % (ref 0.0–0.2)

## 2018-10-21 LAB — MAGNESIUM: Magnesium: 2.3 mg/dL (ref 1.7–2.4)

## 2018-10-21 MED ORDER — SODIUM CHLORIDE 0.9% FLUSH
10.0000 mL | INTRAVENOUS | Status: DC | PRN
  Administered 2018-10-21: 10 mL via INTRAVENOUS
  Filled 2018-10-21: qty 10

## 2018-10-21 MED ORDER — HEPARIN SOD (PORK) LOCK FLUSH 100 UNIT/ML IV SOLN
500.0000 [IU] | Freq: Once | INTRAVENOUS | Status: AC
  Administered 2018-10-21: 11:00:00 500 [IU] via INTRAVENOUS

## 2018-10-21 NOTE — Progress Notes (Signed)
Pt here for follow up. Denies any concerns.  

## 2018-10-21 NOTE — Telephone Encounter (Signed)
Confirmed with office, new patient referral was received.

## 2018-10-31 ENCOUNTER — Other Ambulatory Visit: Payer: Self-pay | Admitting: Family Medicine

## 2018-10-31 DIAGNOSIS — E782 Mixed hyperlipidemia: Secondary | ICD-10-CM

## 2018-10-31 DIAGNOSIS — I10 Essential (primary) hypertension: Secondary | ICD-10-CM

## 2018-10-31 DIAGNOSIS — J441 Chronic obstructive pulmonary disease with (acute) exacerbation: Secondary | ICD-10-CM

## 2018-10-31 DIAGNOSIS — E039 Hypothyroidism, unspecified: Secondary | ICD-10-CM

## 2018-11-01 ENCOUNTER — Ambulatory Visit
Admission: RE | Admit: 2018-11-01 | Discharge: 2018-11-01 | Disposition: A | Discharge status: Home / Self Care | Payer: BLUE CROSS/BLUE SHIELD | Source: Ambulatory Visit | Attending: Hematology and Oncology | Admitting: Hematology and Oncology

## 2018-11-01 ENCOUNTER — Other Ambulatory Visit: Payer: Self-pay

## 2018-11-01 DIAGNOSIS — N63 Unspecified lump in unspecified breast: Secondary | ICD-10-CM | POA: Diagnosis not present

## 2018-11-12 ENCOUNTER — Other Ambulatory Visit: Payer: Self-pay

## 2018-11-12 ENCOUNTER — Ambulatory Visit: Payer: BLUE CROSS/BLUE SHIELD | Admitting: Pulmonary Disease

## 2018-11-12 ENCOUNTER — Encounter: Payer: Self-pay | Admitting: Pulmonary Disease

## 2018-11-12 VITALS — BP 140/80 | HR 83 | Temp 97.3°F | Ht 69.0 in | Wt 200.8 lb

## 2018-11-12 DIAGNOSIS — F1290 Cannabis use, unspecified, uncomplicated: Secondary | ICD-10-CM

## 2018-11-12 DIAGNOSIS — R0601 Orthopnea: Secondary | ICD-10-CM

## 2018-11-12 DIAGNOSIS — R0602 Shortness of breath: Secondary | ICD-10-CM | POA: Diagnosis not present

## 2018-11-12 DIAGNOSIS — Z87891 Personal history of nicotine dependence: Secondary | ICD-10-CM | POA: Diagnosis not present

## 2018-11-12 MED ORDER — ANORO ELLIPTA 62.5-25 MCG/INH IN AEPB: 1.0000 | INHALATION_SPRAY | Freq: Every day | RESPIRATORY_TRACT | 5 refills | Status: DC

## 2018-11-12 MED ORDER — ANORO ELLIPTA 62.5-25 MCG/INH IN AEPB: 1.0000 | INHALATION_SPRAY | Freq: Every day | RESPIRATORY_TRACT | 0 refills | Status: AC

## 2018-11-12 NOTE — Patient Instructions (Signed)
Trial of Anoro inhaler - one inhalation daily  Continue albuterol inhaler (Ventolin) as needed for increased shortness of breath, wheezing, chest tightness, cough  Follow-up in 3-4 weeks  PFTs (lung function test) have been ordered and will be obtained when able

## 2018-11-12 NOTE — Progress Notes (Signed)
PULMONARY CONSULT NOTE  Requesting MD/Service: Mike Gip Date of initial consultation: 11/12/18 Reason for consultation: Dyspnea  PT PROFILE: 63 y.o. female former smoker (40-pack-year history, quit 2019) with recently diagnosed adenocarcinoma, suspected lung primary.  She is referred for evaluation of dyspnea  DATA: 01/19/18 CT chest: LLL 7.3 suspicious irregular nodule.  Mediastinal and R hilar adenopathy. 02/09/18 PET scan: Unusual pattern of nodal metastasis with intensely hypermetabolic enlarged precarinal lymph node, R hilar lymph node and R axillary lymph node. Recommend biopsy of the R axial lymph node. No metabolic activity associated with the LLL pulmonary nodule. 02/24/18 R axillary node biopsy: Metastatic adenocarcinoma, most compatible with lung primary. 06/17/18 CT chest: Marked interval reduction of mediastinal and right hilar lymphadenopathy. No new lymphadenopathy in the chest. Slight increase in size of 8 mm left lower lobe pulmonary nodule identified previously now measuring 10 mm. This was not hypermetabolic on the previous PET-CT, but the interval progression is concerning. Interval development of a 7 mm nodule in the medial right upper lobe. This appears to be on a background of small airway impaction and subtle tree-in-bud nodularity 07/27/18 Echocardiogram: LVEF 60-65%.  Impaired relaxation.  LA size normal.  Right-sided chambers normal.  RVSP estimate 27 mmHg. 08/23/18 CT chest: Stable 10 mm left lower lobe pulmonary nodule. No new or progressive findings on today's study  INTERVAL:  HPI:  As above.  She has been a lifelong smoker until recently.  She has noted increasing shortness of breath over the past 2 months.  She notes dyspnea mostly when she lies in a supine position.  This can occur either day or night.  She is relatively sedentary and also limited by claudication.  Therefore, she describes little exertional dyspnea.  She believes that she could walk 1 flight of  stairs comfortably but probably not to.  She also describes vague L sided chest pain while in supine position and experiencing dyspnea.  Her dyspnea improves when she sits up and is also decreased in severity after using albuterol inhaler.  She has minimal cough with minimal mucus production.  She has never had hemoptysis.  She denies fevers, anginal or exertional chest pain, pleuritic chest pain, purulent sputum, unexplained weight loss, hemoptysis, lower extremity edema, calf tenderness, nocturia.  She smoked 1 pack cigarettes a day for from age 82 until age 38.  She continues to smoke marijuana on a daily basis.  On average she smokes 1 joint today.  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  She has no significant occupational or environmental exposures.  She has no significant travel history and was never in the [employer].  She has no history of tuberculosis exposure.  Evaluation of dyspnea thus far has included echocardiogram with results as documented above.  Past Medical History:  Diagnosis Date  . Cancer ([place]) 1989   cervical  . Depression   . Family history of breast cancer   . Family history of colon cancer   . Family history of ovarian cancer   . Family history of stomach cancer   . Family history of throat cancer   . Hyperlipidemia   . Hypertension   . Malignant neoplasm of lung ([place]) 03/01/2018  . Thyroid disease     Past Surgical History:  Procedure Laterality Date  . BILATERAL CARPAL TUNNEL RELEASE    . BREAST CYST ASPIRATION Left   . LUNG BIOPSY    . PARTIAL HYSTERECTOMY    .  PORTA CATH INSERTION N/A 03/03/2018   Procedure: PORTA CATH INSERTION;  Surgeon: Katha Cabal, MD;  Location: [facility];  Service: Cardiovascular;  Laterality: N/A;  . TUBAL LIGATION      MEDICATIONS: I have reviewed all medications and confirmed regimen as documented  Social History   Socioeconomic History   . Marital status: Single    Spouse name: Not on file  . Number of children: 3  . Years of education: Not on file  . Highest education level: Not on file  Occupational History  . Not on file  Social Needs  . Financial resource strain: Not on file  . Food insecurity    Worry: Not on file    Inability: Not on file  . Transportation needs    Medical: Not on file    Non-medical: Not on file  Tobacco Use  . Smoking status: Former Smoker    Packs/day: 1.00    Years: 25.00    Pack years: 25.00    Types: Cigarettes    Quit date: 11/19/2017    Years since quitting: 0.9  . Smokeless tobacco: Never Used  Substance and Sexual Activity  . Alcohol use: No  . Drug use: Yes    Types: Marijuana    Comment: occasional  . Sexual activity: Not on file  Lifestyle  . Physical activity    Days per week: Not on file    Minutes per session: Not on file  . Stress: Not on file  Relationships  . Social Herbalist on phone: Not on file    Gets together: Not on file    Attends religious service: Not on file    Active member of club or organization: Not on file    Attends meetings of clubs or organizations: Not on file    Relationship status: Not on file  . Intimate partner violence    Fear of current or ex partner: Not on file    Emotionally abused: Not on file    Physically abused: Not on file    Forced sexual activity: Not on file  Other Topics Concern  . Not on file  Social History Narrative  . Not on file    Family History  Problem Relation Age of Onset  . Colon cancer Mother 28  . Colon cancer Maternal Grandmother 78  . Hypertension Father   . Breast cancer Sister 68  . Throat cancer Brother   . Heart attack Maternal Aunt   . Ovarian cancer Maternal Aunt 70  . Stomach cancer Paternal Grandmother        dx >50  . Throat cancer Brother     ROS: No fever, myalgias/arthralgias, unexplained weight loss or weight gain No new focal weakness or sensory deficits No  otalgia, hearing loss, visual changes, nasal and sinus symptoms, mouth and throat problems No neck pain or adenopathy No abdominal pain, N/V/D, diarrhea, change in bowel pattern No dysuria, change in urinary pattern   Vitals:   11/12/18 1117  BP: 140/80  Pulse: 83  Temp: (!) 97.3 F (36.3 C)  TempSrc: Skin  SpO2: 96%  Weight: 200 lb 12.8 oz (91.1 kg)  Height: 5\' 9"  (1.753 m)  Room air   EXAM:  Gen: Moderately obese in NAD HEENT: NCAT, sclerae white, oropharynx normal Neck: No LAN, no JVD noted Lungs: full BS, normal percussion note throughout, no wheezes or other adventitious sounds Cardiovascular: RRR, no M noted Abdomen: Soft, NT, +BS Ext:  no C/C/E Neuro: PERRL, EOMI, motor/sensory grossly intact Skin: No lesions noted   DATA:   BMP Latest Ref Rng & Units 10/21/2018 08/23/2018 06/18/2018  Glucose 70 - 99 mg/dL 96 100(H) 89  BUN 6 - 20 mg/dL 11 16 13   Creatinine 0.44 - 1.00 mg/dL 0.93 0.94 0.80  BUN/Creat Ratio 9 - 23 - - -  Sodium 135 - 145 mmol/L 136 136 136  Potassium 3.5 - 5.1 mmol/L 3.8 4.1 4.0  Chloride 98 - 111 mmol/L 103 104 102  CO2 22 - 32 mmol/L 24 25 26   Calcium 8.9 - 10.3 mg/dL 8.7(L) 8.6(L) 8.8(L)    CBC Latest Ref Rng & Units 10/21/2018 08/23/2018 06/18/2018  WBC 4.0 - 10.5 K/uL 8.0 7.4 8.2  Hemoglobin 12.0 - 15.0 g/dL 14.0 13.0 14.1  Hematocrit 36.0 - 46.0 % 41.5 39.7 42.1  Platelets 150 - 400 K/uL 237 215 254    CXR 01/07/2018: Hyperinflation with very vague appearing nodular densities in LLL  I have personally reviewed all chest radiographs reported above including CXRs and CT chest unless otherwise indicated  IMPRESSION:     ICD-10-CM   1. Former smoker  Z87.891   2. Marijuana use  F12.90   3. Episodic shortness of breath relieved by albuterol  R06.02 Pulmonary Function Test [facility] Only  4. Orthopnea  R06.01    The symptom of orthopnea would suggest a cardiac etiology.  However, her echocardiogram is entirely normal.  Further, she does seem to  have improvement in her symptoms with bronchodilator therapy (albuterol).  Therefore, I suspect that her symptoms are likely attributable to COPD.  Her chest x-ray shows significant hyperinflation which would be consistent with COPD.  PLAN:  1) trial of Anoro inhaler, 1 inhalation daily.  Sample provided.  Prescription entered. 2) continue albuterol inhaler as needed for increased shortness of breath, wheezing, chest tightness, cough 3) at some point, PFTs will be obtained.  However, scheduling of PFTs is significantly backlogged due to coronavirus pandemic 4) I counseled against all smoking including marijuana  Follow-up in 3-4 weeks.  Call sooner if needed   Merton Border, MD PCCM service Mobile [phone] Pager [phone] 11/12/2018 2:58 PM

## 2018-11-17 MED FILL — TAGRISSO 80 MG TABLET: 80 | 30 days supply | Qty: 30 | Fill #2

## 2018-11-24 ENCOUNTER — Other Ambulatory Visit: Payer: Self-pay

## 2018-11-24 ENCOUNTER — Ambulatory Visit
Admission: RE | Admit: 2018-11-24 | Discharge: 2018-11-24 | Disposition: A | Discharge status: Home / Self Care | Payer: BLUE CROSS/BLUE SHIELD | Source: Ambulatory Visit | Attending: Hematology and Oncology | Admitting: Hematology and Oncology

## 2018-11-24 DIAGNOSIS — C3490 Malignant neoplasm of unspecified part of unspecified bronchus or lung: Secondary | ICD-10-CM | POA: Diagnosis not present

## 2018-11-28 NOTE — Progress Notes (Signed)
[facility]  [address], [address] [place], [place] [address] Phone: [phone]  Fax: [phone]   Clinic Day:  11/29/2018  Referring physician: Juline Patch, MD  Chief Complaint: Kristina Wright is a 63 y.o. female with stage IV adenocarcinoma of the lung on osimertinib who is seen for 1 month assessment.   HPI: The patient was last seen in the medical oncology clinic on 10/21/2018. At that time, she felt good. She noted some shortness of breath. Exam was unremarkable.  Diagnostic left mammogram on 11/01/2018 revealed a benign left breast mass unchanged mammographically since at least 2016. There were no suspicious findings in the left breast.  She was seen in pulmonology on 11/12/2018 for lung cancer and dyspnea. She was given an albuterol inhaler to use as needed. She was counseled to stop all smoking. PFTs were scheduled.   Chest CT on 11/24/2018 revealed an enlarging left lower lobe pulmonary nodule measuring 1.5 x 1.4 cm, concerning for progressive malignancy. There was mild diffuse bronchial wall thickening with mild centrilobular and paraseptal emphysema; imaging findings suggestive of underlying COPD.  During the interim, she is doing well.  She notes she has pink eye, and her eyes are itching and watering.  Symptoms have improved with erythromycin eye drops. She denies any other concerns.    Past Medical History:  Diagnosis Date   Cancer [facility]) 1989   cervical   Depression    Family history of breast cancer    Family history of colon cancer    Family history of ovarian cancer    Family history of stomach cancer    Family history of throat cancer    Hyperlipidemia    Hypertension    Malignant neoplasm of lung ([place]) 03/01/2018   Thyroid disease     Past Surgical History:  Procedure Laterality Date   BILATERAL CARPAL TUNNEL RELEASE     BREAST CYST ASPIRATION Left    LUNG BIOPSY     PARTIAL HYSTERECTOMY     PORTA CATH  INSERTION N/A 03/03/2018   Procedure: PORTA CATH INSERTION;  Surgeon: Katha Cabal, MD;  Location: [facility];  Service: Cardiovascular;  Laterality: N/A;   TUBAL LIGATION      Family History  Problem Relation Age of Onset   Colon cancer Mother 3   Colon cancer Maternal Grandmother 61   Hypertension Father    Breast cancer Sister 68   Throat cancer Brother    Heart attack Maternal Aunt    Ovarian cancer Maternal Aunt 11   Stomach cancer Paternal Grandmother        dx >50   Throat cancer Brother     Social History:  reports that she quit smoking about a year ago. Her smoking use included cigarettes. She has a 25.00 pack-year smoking history. She has never used smokeless tobacco. She reports current drug use. Drug: Marijuana. She reports that she does not drink alcohol. 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 The patient is alone today  Allergies: No Known Allergies  Current Medications: Current Outpatient Medications  Medication Sig Dispense Refill   ALPRAZolam (XANAX) 0.25 MG tablet Take 1 tablet (0.25 mg total) by mouth at bedtime as needed. for anxiety 30 tablet 5   aspirin EC 81 MG tablet Take 81 mg by mouth daily.     atorvastatin (LIPITOR) 10 MG tablet  Take 1 tablet by mouth once daily 90 tablet 0   busPIRone (BUSPAR) 5 MG tablet Take 1 tablet (5 mg total) by mouth daily. 90 tablet 1   clopidogrel (PLAVIX) 75 MG tablet Take 1 tablet (75 mg total) by mouth daily. 90 tablet 1   EUTHYROX 75 MCG tablet Take 1 tablet by mouth once daily 90 tablet 0   FLUoxetine (PROZAC) 10 MG capsule Take 1 capsule (10 mg total) by mouth daily. 90 capsule 1   lidocaine-prilocaine (EMLA) cream Apply to affected area once 30 g 3   lisinopril-hydrochlorothiazide (ZESTORETIC)  10-12.5 MG tablet Take 1 tablet by mouth once daily 90 tablet 0   meclizine (ANTIVERT) 25 MG tablet Take 1 tablet (25 mg total) by mouth 3 (three) times daily as needed for dizziness. 30 tablet 0   Multiple Vitamins-Minerals (MULTIVITAMIN ADULT PO) Take 1 tablet by mouth daily.      osimertinib mesylate (TAGRISSO) 80 MG tablet TAKE 1 TABLET (80 MG TOTAL) BY MOUTH DAILY. 30 tablet 2   umeclidinium-vilanterol (ANORO ELLIPTA) 62.5-25 MCG/INH AEPB Inhale 1 puff into the lungs daily. 60 each 5   VENTOLIN HFA 108 (90 Base) MCG/ACT inhaler INHALE 2 PUFFS BY MOUTH EVERY 6 HOURS AS NEEDED FOR WHEEZING FOR SHORTNESS OF BREATH 18 g 0   ondansetron (ZOFRAN) 8 MG tablet Take 1 tablet (8 mg total) by mouth 2 (two) times daily as needed (Nausea or vomiting). Start if needed on the third day after chemotherapy. (Patient not taking: Reported on 11/29/2018) 30 tablet 1   prochlorperazine (COMPAZINE) 10 MG tablet Take 1 tablet (10 mg total) by mouth every 6 (six) hours as needed (Nausea or vomiting). (Patient not taking: Reported on 11/29/2018) 30 tablet 1   No current facility-administered medications for this visit.    Facility-Administered Medications Ordered in Other Visits  Medication Dose Route Frequency Provider Last Rate Last Dose   sodium chloride flush (NS) 0.9 % injection 10 mL  10 mL Intravenous PRN Nolon Stalls C, MD   10 mL at 11/29/18 1524    Review of Systems  Constitutional: Positive for weight loss (stable). Negative for chills, diaphoresis, fever and malaise/fatigue.       Doing well.  HENT: Negative.  Negative for congestion, ear pain, hearing loss, nosebleeds, sinus pain and sore throat.   Eyes: Positive for discharge and redness. Negative for blurred vision, double vision and photophobia.  Respiratory: Positive for shortness of breath (uses inhaler daily). Negative for cough, sputum production and wheezing.   Cardiovascular: Negative.  Negative for chest pain, palpitations, leg  swelling and PND.  Gastrointestinal: Negative.  Negative for abdominal pain, blood in stool, constipation, diarrhea, heartburn, melena, nausea and vomiting.       BMs 1-2x daily.  Genitourinary: Negative.  Negative for dysuria, frequency, hematuria and urgency.  Musculoskeletal: Negative.  Negative for back pain, joint pain and myalgias.  Skin: Negative.  Negative for itching and rash.  Neurological: Negative.  Negative for dizziness, tingling, sensory change, focal weakness, weakness and headaches.  Endo/Heme/Allergies: Does not bruise/bleed easily.       Thyroid disease on Synthroid.  Psychiatric/Behavioral: Negative for depression, memory loss and substance abuse. The patient is nervous/anxious (takes Xanax). The patient does not have insomnia.   All other systems reviewed and are negative.  Performance status (ECOG): 1  Vitals Blood pressure (!) 149/81, pulse 82, temperature 97.8 F (36.6 C), temperature source Tympanic, resp. rate 18, weight 202 lb 6.1 oz (91.8 kg), SpO2 98 %.  Physical Exam  Constitutional: She is oriented to person, place, and time. She appears well-developed and well-nourished. No distress.  HENT:  Shoulder length dark hair.  Eyes: Conjunctivae and EOM are normal. Right eye exhibits discharge. Left eye exhibits discharge.  Glasses.  Brown eyes.  Neck: Normal range of motion.  Neurological: She is alert and oriented to person, place, and time.  Skin: No rash noted. She is not diaphoretic. No pallor.  Psychiatric: She has a normal mood and affect. Her behavior is normal. Judgment and thought content normal.  Nursing note and vitals reviewed.   Imaging studies: 01/19/2018:  Chest CTrevealed a 8.7 x 5.8 mm slightly irregular pulmonary nodule within the anterior left lower lobe. There are no other concerning pulmonary nodules. There was a focal 3.1 mm x 9.2 mm pleural nodule at the right middle lobe minor fissure felt likely a benign linear scar or lymphoid  tissue of the pleura. There was mediastinal adenopathy including a 1.7 x 3.7 cm precarinal lymph node mass and a 1.7 cm right hilar lymph node. 02/09/2018:  PET scanrevealed an unusual pattern of nodal metastasis with intensely hypermetabolic enlarged precarinal lymph node, RIGHT hilar lymph node and RIGHT axillary lymph node. Recommend biopsy of the RIGHT axillarylymph node. There was no metabolic activity associated with the RIGHT lobe pulmonary nodule. There was no additional evidence primary or metastatic carcinoma. Clinical stageT1N3M1. 06/17/2018:  Chest CT revealed marked interval reduction of mediastinal and right hilar lymphadenopathy.There was nonew lymphadenopathy in the chest. There was slight increase intheleft lower lobe pulmonary nodule (8 mm to10 mm).This was not hypermetabolic on the previous PET-CT. There was interval development of a 7 mm nodule in the medial right upper lobein a background of small airway impaction and subtle tree-in-bud nodularity. 08/24/2018:  Chest CT revealed stable 10 mm left lower lobe pulmonary nodule. Tree-in-bud nodularity seen medial right upper lobe on the previous studywas likely improved, butwasobscured by breathing motion. There was a stable14 mm left breast nodule. 11/24/2018:  Chest CT revealed an enlarging left lower lobe pulmonary nodule measuring 1.5 x 1.4 cm, concerning for progressive malignancy. There was mild diffuse bronchial wall thickening with mild centrilobular and paraseptal emphysema; imaging findings suggestive of underlying COPD.  03/10/2018:  Head MRIrevealed no evidence of metastatic disease. 11/01/2018:  Diagnostic left mammogram revealed a benign left breast mass unchanged mammographically since at least 2016. There were no suspicious findings in the left breast.   Infusion on 11/29/2018  Component Date Value Ref Range Status   Sodium 11/29/2018 138  135 - 145 mmol/L Final   Potassium 11/29/2018 3.7  3.5  - 5.1 mmol/L Final   Chloride 11/29/2018 104  98 - 111 mmol/L Final   CO2 11/29/2018 26  22 - 32 mmol/L Final   Glucose, Bld 11/29/2018 101* 70 - 99 mg/dL Final   BUN 11/29/2018 12  6 - 20 mg/dL Final   Creatinine, Ser 11/29/2018 0.95  0.44 - 1.00 mg/dL Final   Calcium 11/29/2018 8.9  8.9 - 10.3 mg/dL Final   Total Protein 11/29/2018 7.1  6.5 - 8.1 g/dL Final   Albumin 11/29/2018 3.8  3.5 - 5.0 g/dL Final   AST 11/29/2018 19  15 - 41 U/L Final   ALT 11/29/2018 15  0 - 44 U/L Final   Alkaline Phosphatase 11/29/2018 71  38 - 126 U/L Final   Total Bilirubin 11/29/2018 0.5  0.3 - 1.2 mg/dL Final   GFR calc non Af Amer 11/29/2018 >60  >60 mL/min  Final   GFR calc Af Amer 11/29/2018 >60  >60 mL/min Final   Anion gap 11/29/2018 8  5 - 15 Final   Performed at [facility] [place] [place], [place] [address]   Magnesium 11/29/2018 2.3  1.7 - 2.4 mg/dL Final   Performed at [facility], [address] [place], [place] [address]   WBC 11/29/2018 9.0  4.0 - 10.5 K/uL Final   RBC 11/29/2018 4.31  3.87 - 5.11 MIL/uL Final   Hemoglobin 11/29/2018 13.2  12.0 - 15.0 g/dL Final   HCT 11/29/2018 39.9  36.0 - 46.0 % Final   MCV 11/29/2018 92.6  80.0 - 100.0 fL Final   MCH 11/29/2018 30.6  26.0 - 34.0 pg Final   MCHC 11/29/2018 33.1  30.0 - 36.0 g/dL Final   RDW 11/29/2018 12.5  11.5 - 15.5 % Final   Platelets 11/29/2018 234  150 - 400 K/uL Final   nRBC 11/29/2018 0.0  0.0 - 0.2 % Final   Neutrophils Relative % 11/29/2018 70  % Final   Neutro Abs 11/29/2018 6.3  1.7 - 7.7 K/uL Final   Lymphocytes Relative 11/29/2018 23  % Final   Lymphs Abs 11/29/2018 2.1  0.7 - 4.0 K/uL Final   Monocytes Relative 11/29/2018 6  % Final   Monocytes Absolute 11/29/2018 0.5  0.1 - 1.0 K/uL Final   Eosinophils Relative 11/29/2018 1  % Final   Eosinophils Absolute 11/29/2018 0.1  0.0 - 0.5 K/uL Final   Basophils Relative 11/29/2018 0  % Final    Basophils Absolute 11/29/2018 0.0  0.0 - 0.1 K/uL Final   Immature Granulocytes 11/29/2018 0  % Final   Abs Immature Granulocytes 11/29/2018 0.03  0.00 - 0.07 K/uL Final   Performed at [facility], [address], [place], [place] [address]    Assessment:  DAYSIA VANDENBOOM is a 63 y.o. female with stage IV adenocarcinoma of the lungs/p right axillary biopsy on 02/24/2018. Pathologyrevealed metastatic adenocarcinoma c/w lung primary. Tumor cells were positive for CK7 and TTF-1 with minimal blush reactivity for CK20. Tumor cells were negative for GATA3, ER, and PR. Omniseq testingrevealed EGFR c.2582>A (L861Q) mutation and PDL-1 60% TPS. CEAwas 2.9 on 05/17/2018.  PET scanon 02/09/2018 revealed an unusual pattern of nodal metastasis with intensely hypermetabolic enlarged precarinal lymph node, RIGHT hilar lymph node and RIGHT axillary lymph node. Recommend biopsy of the RIGHT axillarylymph node. There was no metabolic activity associated with the RIGHT lobe pulmonary nodule. There was no additional evidence primary or metastatic carcinoma. Clinical stageT1N3M1.  Chest CT on 11/24/2018 revealed an enlarging left lower lobe pulmonary nodule measuring 1.5 x 1.4 cm, concerning for progressive malignancy. There was mild diffuse bronchial wall thickening with mild centrilobular and paraseptal emphysema; imaging findings suggestive of underlying COPD.  Head MRIon 03/10/2018 revealed no evidence of metastatic disease.  She began osimertinibon 03/25/2018.Echoon 07/27/2018 showed an ejection fraction in the range of 60-65%. She is tolerating treatment well.  Diagnostic left mammogram on 11/01/2018 revealed a benign left breast mass unchanged mammographically since at least 2016. There were no suspicious findings in the left breast.  She has a history of cervical cancerin 1989. She underwent cryotherapy.  Symptomatically, she denies any respiratory symptoms.  Exam  reveals left sided conjunctivitis.  Plan: 1. Stage IV adenocarcinoma of the Lung (EGFR L861Q mutation) Clinically, she is doing well.    She is tolerating osimertinib well.    Review interval chest CT.  Images personally reviewed.  Agree with radiology interpretation.     She has an enlarging 1.5 cm left lower lobe pulmonary nodule.    Other disease is stable to improved.  She has been on osimertinib since 03/25/2018.     Based on prior studies, osimertinib PFS is 18.9 months with a duration of response of 17.2 months.    Discuss continuation of osimertinib.    Discuss possible biopsy or SBRT to isolated lesion.  Present at tumor board. 2.Port-a-cath Continue port flushes every 8 to 12 weeks during COVID-19 pandemic.  3.Breast nodule Chest CT on 08/23/2018 revealed a 14 mm left breast nodule. Diagnostic left mammogram on 11/01/2018 revealed a benign left breast mass unchanged since at least 2016. 4.   Present at tumor board. 5.   MD to call patient after tumor board. 6.   RTC based on above.  I discussed the assessment and treatment plan with the patient.  The patient was provided an opportunity to ask questions and all were answered.  The patient agreed with the plan and demonstrated an understanding of the instructions.  The patient was advised to call back if the symptoms worsen or if the condition fails to improve as anticipated.   Lequita Asal, MD, PhD    11/29/2018, 3:48 PM  I, Molly Dorshimer, am acting as Education administrator for [employer]. Mike Gip, MD, PhD.  I, Alayna Mabe C. Mike Gip, MD, have reviewed the above documentation for accuracy and completeness, and I agree with the above.

## 2018-11-29 ENCOUNTER — Inpatient Hospital Stay: Payer: BLUE CROSS/BLUE SHIELD

## 2018-11-29 ENCOUNTER — Inpatient Hospital Stay: Payer: BLUE CROSS/BLUE SHIELD | Attending: Hematology and Oncology | Admitting: Hematology and Oncology

## 2018-11-29 ENCOUNTER — Encounter: Payer: Self-pay | Admitting: Hematology and Oncology

## 2018-11-29 ENCOUNTER — Other Ambulatory Visit: Payer: Self-pay

## 2018-11-29 VITALS — BP 149/81 | HR 82 | Temp 97.8°F | Resp 18 | Wt 202.4 lb

## 2018-11-29 DIAGNOSIS — Z452 Encounter for adjustment and management of vascular access device: Secondary | ICD-10-CM | POA: Diagnosis not present

## 2018-11-29 DIAGNOSIS — R911 Solitary pulmonary nodule: Secondary | ICD-10-CM | POA: Insufficient documentation

## 2018-11-29 DIAGNOSIS — C773 Secondary and unspecified malignant neoplasm of axilla and upper limb lymph nodes: Secondary | ICD-10-CM | POA: Diagnosis not present

## 2018-11-29 DIAGNOSIS — C3490 Malignant neoplasm of unspecified part of unspecified bronchus or lung: Secondary | ICD-10-CM | POA: Insufficient documentation

## 2018-11-29 DIAGNOSIS — N63 Unspecified lump in unspecified breast: Secondary | ICD-10-CM

## 2018-11-29 LAB — COMPREHENSIVE METABOLIC PANEL
ALT: 15 U/L (ref 0–44)
AST: 19 U/L (ref 15–41)
Albumin: 3.8 g/dL (ref 3.5–5.0)
Alkaline Phosphatase: 71 U/L (ref 38–126)
Anion gap: 8 (ref 5–15)
BUN: 12 mg/dL (ref 6–20)
CO2: 26 mmol/L (ref 22–32)
Calcium: 8.9 mg/dL (ref 8.9–10.3)
Chloride: 104 mmol/L (ref 98–111)
Creatinine, Ser: 0.95 mg/dL (ref 0.44–1.00)
GFR calc Af Amer: >60 mL/min (ref >60)
GFR calc non Af Amer: >60 mL/min (ref >60)
Glucose, Bld: 101 mg/dL — ABNORMAL HIGH (ref 70–99)
Potassium: 3.7 mmol/L (ref 3.5–5.1)
Sodium: 138 mmol/L (ref 135–145)
Total Bilirubin: 0.5 mg/dL (ref 0.3–1.2)
Total Protein: 7.1 g/dL (ref 6.5–8.1)

## 2018-11-29 LAB — CBC WITH DIFFERENTIAL/PLATELET
Abs Immature Granulocytes: 0.03 10*3/uL (ref 0.00–0.07)
Basophils Absolute: 0 10*3/uL (ref 0.0–0.1)
Basophils Relative: 0 %
Eosinophils Absolute: 0.1 10*3/uL (ref 0.0–0.5)
Eosinophils Relative: 1 %
HCT: 39.9 % (ref 36.0–46.0)
Hemoglobin: 13.2 g/dL (ref 12.0–15.0)
Immature Granulocytes: 0 %
Lymphocytes Relative: 23 %
Lymphs Abs: 2.1 10*3/uL (ref 0.7–4.0)
MCH: 30.6 pg (ref 26.0–34.0)
MCHC: 33.1 g/dL (ref 30.0–36.0)
MCV: 92.6 fL (ref 80.0–100.0)
Monocytes Absolute: 0.5 10*3/uL (ref 0.1–1.0)
Monocytes Relative: 6 %
Neutro Abs: 6.3 10*3/uL (ref 1.7–7.7)
Neutrophils Relative %: 70 %
Platelets: 234 10*3/uL (ref 150–400)
RBC: 4.31 MIL/uL (ref 3.87–5.11)
RDW: 12.5 % (ref 11.5–15.5)
WBC: 9 10*3/uL (ref 4.0–10.5)
nRBC: 0 % (ref 0.0–0.2)

## 2018-11-29 LAB — MAGNESIUM: Magnesium: 2.3 mg/dL (ref 1.7–2.4)

## 2018-11-29 MED ORDER — SODIUM CHLORIDE 0.9% FLUSH
10.0000 mL | INTRAVENOUS | Status: DC | PRN
  Administered 2018-11-29: 10 mL via INTRAVENOUS
  Filled 2018-11-29: qty 10

## 2018-11-29 MED ORDER — HEPARIN SOD (PORK) LOCK FLUSH 100 UNIT/ML IV SOLN
500.0000 [IU] | Freq: Once | INTRAVENOUS | Status: AC
  Administered 2018-11-29: 500 [IU] via INTRAVENOUS

## 2018-11-29 NOTE — Progress Notes (Signed)
Pt here for follow up. Denies any concerns.  

## 2018-12-06 ENCOUNTER — Other Ambulatory Visit: Payer: Self-pay | Admitting: Hematology and Oncology

## 2018-12-09 ENCOUNTER — Other Ambulatory Visit: Payer: Self-pay | Admitting: Hematology and Oncology

## 2018-12-09 ENCOUNTER — Other Ambulatory Visit: Payer: BLUE CROSS/BLUE SHIELD

## 2018-12-09 ENCOUNTER — Telehealth: Payer: Self-pay | Admitting: Hematology and Oncology

## 2018-12-09 DIAGNOSIS — C3490 Malignant neoplasm of unspecified part of unspecified bronchus or lung: Secondary | ICD-10-CM

## 2018-12-09 NOTE — Telephone Encounter (Signed)
Re:  Tumor board discussions  Discussed plan for bronchoscopy to obtain tissue from growing nodule. Patient in agreement. Consult for Dr Patsey Berthold placed.  Lequita Asal, MD

## 2018-12-09 NOTE — Progress Notes (Signed)
Tumor Board Documentation  Kristina Wright was presented by Dr Mike Gip at our Tumor Board on 12/09/2018, which included representatives from medical oncology, pathology, radiology, surgical, navigation, internal medicine, palliative care, pulmonology, research.  Kristina Wright currently presents as a current patient, for discussion, for new tumor(s) with history of the following treatments: active survellience, immunotherapy.  Additionally, we reviewed previous medical and familial history, history of present illness, and recent lab results along with all available histopathologic and imaging studies. The tumor board considered available treatment options and made the following recommendations: Biopsy Refer to Pulmonolgy for Bronch with biopsy  The following procedures/referrals were also placed: No orders of the defined types were placed in this encounter.   Clinical Trial Status: not discussed   Staging used: AJCC Stage Group  AJCC Staging:       Group: Stage 4 Adenocarcinoma of lung   National site-specific guidelines NCCN were discussed with respect to the case.  Tumor board is a meeting of clinicians from various specialty areas who evaluate and discuss patients for whom a multidisciplinary approach is being considered. Final determinations in the plan of care are those of the provider(s). The responsibility for follow up of recommendations given during tumor board is that of the provider.   Today's extended care, comprehensive team conference, Kristina Wright was not present for the discussion and was not examined.   Multidisciplinary Tumor Board is a multidisciplinary case peer review process.  Decisions discussed in the Multidisciplinary Tumor Board reflect the opinions of the specialists present at the conference without having examined the patient.  Ultimately, treatment and diagnostic decisions rest with the primary provider(s) and the patient.

## 2018-12-13 ENCOUNTER — Other Ambulatory Visit: Payer: Self-pay

## 2018-12-13 ENCOUNTER — Ambulatory Visit: Payer: BLUE CROSS/BLUE SHIELD | Admitting: Family Medicine

## 2018-12-13 ENCOUNTER — Ambulatory Visit: Payer: BLUE CROSS/BLUE SHIELD | Admitting: Pulmonary Disease

## 2018-12-13 ENCOUNTER — Other Ambulatory Visit: Payer: Self-pay | Admitting: Family Medicine

## 2018-12-13 ENCOUNTER — Encounter: Payer: Self-pay | Admitting: Family Medicine

## 2018-12-13 VITALS — BP 122/72 | HR 60 | Ht 69.0 in | Wt 199.0 lb

## 2018-12-13 DIAGNOSIS — I739 Peripheral vascular disease, unspecified: Secondary | ICD-10-CM

## 2018-12-13 DIAGNOSIS — Z23 Encounter for immunization: Secondary | ICD-10-CM | POA: Diagnosis not present

## 2018-12-13 DIAGNOSIS — L280 Lichen simplex chronicus: Secondary | ICD-10-CM | POA: Diagnosis not present

## 2018-12-13 DIAGNOSIS — J449 Chronic obstructive pulmonary disease, unspecified: Secondary | ICD-10-CM | POA: Diagnosis not present

## 2018-12-13 DIAGNOSIS — H1013 Acute atopic conjunctivitis, bilateral: Secondary | ICD-10-CM

## 2018-12-13 MED ORDER — OLOPATADINE HCL 0.1 % OP SOLN: 1.0000 [drp] | Freq: Two times a day (BID) | OPHTHALMIC | 12 refills | Status: DC

## 2018-12-13 MED ORDER — TRIAMCINOLONE ACETONIDE 0.1 % EX CREA: 1.0000 "application " | TOPICAL_CREAM | Freq: Two times a day (BID) | CUTANEOUS | 1 refills | Status: DC

## 2018-12-13 MED ORDER — HYDROXYZINE HCL 10 MG PO TABS: 10.0000 mg | ORAL_TABLET | Freq: Three times a day (TID) | ORAL | 2 refills | Status: DC | PRN

## 2018-12-13 NOTE — Progress Notes (Signed)
Date:  12/13/2018   Name:  Kristina Wright   DOB:  [birth year]   MRN:  [id number]   Chief Complaint: Rash (eye draining and rash on back of neck- itching. tagresso? ), influenza vaccine needed, and COPD (needs ventolin refilled)  Rash This is a new problem. The current episode started 1 to 4 weeks ago. The problem is unchanged. The affected locations include the neck (posterior neck). The rash is characterized by redness and itchiness. Associated with: Tagrisso. Pertinent negatives include no anorexia, congestion, cough, diarrhea, eye pain, facial edema, fatigue, fever, joint pain, nail changes, rhinorrhea, shortness of breath, sore throat or vomiting. Past treatments include antihistamine. The treatment provided mild relief.  COPD There is no chest tightness, cough, difficulty breathing, frequent throat clearing, hemoptysis, hoarse voice, shortness of breath, sputum production or wheezing. This is a recurrent problem. The problem occurs daily. The problem has been waxing and waning. Pertinent negatives include no chest pain, dyspnea on exertion, ear congestion, ear pain, fever, headaches, myalgias, orthopnea, rhinorrhea, sneezing or sore throat. Her symptoms are aggravated by change in weather. Her symptoms are alleviated by ipratropium and beta-agonist. She reports moderate improvement on treatment. Her past medical history is significant for COPD.    Review of Systems  Constitutional: Negative.  Negative for chills, fatigue, fever and unexpected weight change.  HENT: Negative for congestion, ear discharge, ear pain, hoarse voice, rhinorrhea, sinus pressure, sneezing and sore throat.   Eyes: Negative for photophobia, pain, discharge, redness and itching.  Respiratory: Negative for cough, hemoptysis, sputum production, shortness of breath, wheezing and stridor.   Cardiovascular: Negative for chest pain and dyspnea on exertion.  Gastrointestinal: Negative for abdominal pain, anorexia, blood in  stool, constipation, diarrhea, nausea and vomiting.  Endocrine: Negative for cold intolerance, heat intolerance, polydipsia, polyphagia and polyuria.  Genitourinary: Negative for dysuria, flank pain, frequency, hematuria, menstrual problem, pelvic pain, urgency, vaginal bleeding and vaginal discharge.  Musculoskeletal: Negative for arthralgias, back pain, joint pain and myalgias.  Skin: Positive for rash. Negative for nail changes.  Allergic/Immunologic: Negative for environmental allergies and food allergies.  Neurological: Negative for dizziness, weakness, light-headedness, numbness and headaches.  Hematological: Negative for adenopathy. Does not bruise/bleed easily.  Psychiatric/Behavioral: Negative for dysphoric mood. The patient is not nervous/anxious.     Patient Active Problem List   Diagnosis Date Noted  . Left lower lobe pulmonary nodule 11/29/2018  . Breast nodule 08/25/2018  . Anxiety 08/22/2018  . Genetic testing 06/11/2018  . Family history of breast cancer   . Family history of colon cancer   . Family history of ovarian cancer   . Family history of stomach cancer   . Family history of throat cancer   . Metastatic adenocarcinoma to lung with unknown primary site [facility]) 04/27/2018  . Depression 03/12/2018  . Malignant neoplasm of lung ([place]) 03/01/2018  . Goals of care, counseling/discussion 03/01/2018  . Tobacco use disorder 02/08/2016  . Essential hypertension 02/08/2016  . PVD (peripheral vascular disease) ([place]) 02/08/2016  . Blue toe syndrome of right lower extremity ([place]) 02/08/2016    No Known Allergies  Past Surgical History:  Procedure Laterality Date  . BILATERAL CARPAL TUNNEL RELEASE    . BREAST CYST ASPIRATION Left   . LUNG BIOPSY    . PARTIAL HYSTERECTOMY    . PORTA CATH INSERTION N/A 03/03/2018   Procedure: PORTA CATH INSERTION;  Surgeon: Katha Cabal, MD;  Location: [facility];  Service: Cardiovascular;  Laterality: N/A;  .  TUBAL  LIGATION      Social History   Tobacco Use  . Smoking status: Former Smoker    Packs/day: 1.00    Years: 25.00    Pack years: 25.00    Types: Cigarettes    Quit date: 11/19/2017    Years since quitting: 1.0  . Smokeless tobacco: Never Used  Substance Use Topics  . Alcohol use: No  . Drug use: Yes    Types: Marijuana    Comment: occasional     Medication list has been reviewed and updated.  Current Meds  Medication Sig  . ALPRAZolam (XANAX) 0.25 MG tablet Take 1 tablet (0.25 mg total) by mouth at bedtime as needed. for anxiety  . aspirin EC 81 MG tablet Take 81 mg by mouth daily.  Marland Kitchen atorvastatin (LIPITOR) 10 MG tablet Take 1 tablet by mouth once daily  . busPIRone (BUSPAR) 5 MG tablet Take 1 tablet (5 mg total) by mouth daily.  . clopidogrel (PLAVIX) 75 MG tablet TAKE 1 TABLET BY MOUTH ONCE A DAY  . EUTHYROX 75 MCG tablet Take 1 tablet by mouth once daily  . FLUoxetine (PROZAC) 10 MG capsule Take 1 capsule (10 mg total) by mouth daily.  Marland Kitchen lidocaine-prilocaine (EMLA) cream Apply to affected area once  . lisinopril-hydrochlorothiazide (ZESTORETIC) 10-12.5 MG tablet Take 1 tablet by mouth once daily  . meclizine (ANTIVERT) 25 MG tablet Take 1 tablet (25 mg total) by mouth 3 (three) times daily as needed for dizziness.  . Multiple Vitamins-Minerals (MULTIVITAMIN ADULT PO) Take 1 tablet by mouth daily.   Marland Kitchen osimertinib mesylate (TAGRISSO) 80 MG tablet TAKE 1 TABLET (80 MG TOTAL) BY MOUTH DAILY.  Marland Kitchen umeclidinium-vilanterol (ANORO ELLIPTA) 62.5-25 MCG/INH AEPB Inhale 1 puff into the lungs daily.  . VENTOLIN HFA 108 (90 Base) MCG/ACT inhaler INHALE 2 PUFFS BY MOUTH EVERY 6 HOURS AS NEEDED FOR WHEEZING FOR SHORTNESS OF BREATH    PHQ 2/9 Scores 12/13/2018 01/07/2018 09/29/2017 07/20/2017  PHQ - 2 Score 0 1 2 6   PHQ- 9 Score 1 6 4 18     BP Readings from Last 3 Encounters:  12/13/18 122/72  11/29/18 (!) 149/81  11/12/18 140/80    Physical Exam Vitals signs and nursing note reviewed.   Constitutional:      Appearance: She is well-developed.  HENT:     Head: Normocephalic.     Jaw: There is normal jaw occlusion.     Right Ear: Tympanic membrane, ear canal and external ear normal.     Left Ear: Tympanic membrane, ear canal and external ear normal.     Nose: Nose normal.     Mouth/Throat:     Lips: Pink.     Mouth: Mucous membranes are moist.  Eyes:     General: Lids are everted, no foreign bodies appreciated. Vision grossly intact. No scleral icterus.       Left eye: No foreign body or hordeolum.     Extraocular Movements: Extraocular movements intact.     Conjunctiva/sclera:     Right eye: Right conjunctiva is injected.     Left eye: Left conjunctiva is injected.     Pupils: Pupils are equal, round, and reactive to light.  Neck:     Musculoskeletal: Normal range of motion and neck supple.     Thyroid: No thyromegaly.     Vascular: No JVD.     Trachea: No tracheal deviation.  Cardiovascular:     Rate and Rhythm: Normal rate and regular rhythm.  Heart sounds: Normal heart sounds. No murmur. No friction rub. No gallop.   Pulmonary:     Effort: Pulmonary effort is normal. No respiratory distress.     Breath sounds: Normal breath sounds. No decreased air movement. No decreased breath sounds, wheezing, rhonchi or rales.  Abdominal:     General: Bowel sounds are normal.     Palpations: Abdomen is soft. There is no mass.     Tenderness: There is no abdominal tenderness. There is no guarding or rebound.  Musculoskeletal: Normal range of motion.        General: No tenderness.  Lymphadenopathy:     Cervical: No cervical adenopathy.  Skin:    General: Skin is warm.     Findings: Erythema and rash present. Rash is macular and scaling.     Comments: neck  Neurological:     Mental Status: She is alert and oriented to person, place, and time.     Cranial Nerves: No cranial nerve deficit.     Deep Tendon Reflexes: Reflexes normal.  Psychiatric:        Mood and  Affect: Mood is not anxious or depressed.     Wt Readings from Last 3 Encounters:  12/13/18 199 lb (90.3 kg)  11/29/18 202 lb 6.1 oz (91.8 kg)  11/12/18 200 lb 12.8 oz (91.1 kg)    BP 122/72   Pulse 60   Ht 5\' 9"  (1.753 m)   Wt 199 lb (90.3 kg)   BMI 29.39 kg/m   Assessment and Plan: 1. Neurodermatitis, localized New onset.  Rash on the posterior aspect of her neck multiple excoriated areas consistent with neurodermatitis will initiate triamcinolone cream 0.1% apply twice a day.  Encourage Zyrtec in the morning followed by hydroxyzine 10 mg nightly.  If continues our next step would be a dermatology consult - triamcinolone cream (KENALOG) 0.1 %; Apply 1 application topically 2 (two) times daily.  Dispense: 30 g; Refill: 1 - hydrOXYzine (ATARAX/VISTARIL) 10 MG tablet; Take 1 tablet (10 mg total) by mouth 3 (three) times daily as needed.  Dispense: 30 tablet; Refill: 2  2. Allergic conjunctivitis of both eyes Acute.  Persistent.  Consistent with allergic conjunctivitis.  Will initiate Patanol 0.1%.  Reviewed chemotherapy side effects and even though there is some eye irritation I think is more of a allergic conjunctivitis.  But if unresolved on Patanol next step will be ophthalmologic consultation. - olopatadine (PATANOL) 0.1 % ophthalmic solution; Place 1 drop into both eyes 2 (two) times daily.  Dispense: 5 mL; Refill: 12  3. Chronic obstructive pulmonary disease, unspecified COPD type ([place]) Chronic obstructive pulmonary disease.  Patient has not been taking baseline Anoro.  And been using her albuterol intermittently.  This was reviewed with patient.  Patient was provided Anoro again to begin on a daily basis as well as encouraged to use her albuterol on a regular basis.  Will refer back to pulmonary if this does not improve.  4. PVD (peripheral vascular disease) ([place]) Patient with history of peripheral vascular disease will continue Plavix 75 mg once a day.  5. Influenza vaccine  needed Discussed and administered. - Flu Vaccine QUAD 6+ mos PF IM (Fluarix Quad PF)

## 2018-12-13 NOTE — Patient Instructions (Signed)

## 2018-12-15 ENCOUNTER — Telehealth: Payer: Self-pay | Admitting: Pulmonary Disease

## 2018-12-15 NOTE — Telephone Encounter (Signed)

## 2018-12-16 ENCOUNTER — Encounter: Payer: Self-pay | Admitting: Pulmonary Disease

## 2018-12-16 ENCOUNTER — Ambulatory Visit: Payer: BLUE CROSS/BLUE SHIELD | Admitting: Pulmonary Disease

## 2018-12-16 ENCOUNTER — Other Ambulatory Visit: Payer: Self-pay

## 2018-12-16 VITALS — BP 132/80 | HR 71 | Temp 97.3°F | Ht 69.0 in | Wt 201.0 lb

## 2018-12-16 DIAGNOSIS — C3490 Malignant neoplasm of unspecified part of unspecified bronchus or lung: Secondary | ICD-10-CM

## 2018-12-16 DIAGNOSIS — R911 Solitary pulmonary nodule: Secondary | ICD-10-CM | POA: Diagnosis not present

## 2018-12-16 DIAGNOSIS — J449 Chronic obstructive pulmonary disease, unspecified: Secondary | ICD-10-CM

## 2018-12-16 DIAGNOSIS — Z87891 Personal history of nicotine dependence: Secondary | ICD-10-CM

## 2018-12-16 MED FILL — TAGRISSO 80 MG TABLET: 80 | 30 days supply | Qty: 30 | Fill #3

## 2018-12-16 MED FILL — CLOPIDOGREL 75 MG TABLET: 75 | 90 days supply | Qty: 90 | Fill #0

## 2018-12-16 NOTE — Progress Notes (Signed)
Subjective:    Patient ID: Kristina Wright, female    DOB: Nov 12, 1955, 63 y.o.   MRN: [id number]  HPI This is a 63 year old former smoker (quit 11/2017) who presents for evaluation for potential navigational bronchoscopy for biopsy of a lung nodule on the LEFT lower lobe nodule.  She has known adenocarcinoma of the lung stage IV and is on osimertinib.  She is kindly referred by Dr. Nolon Stalls.  Her primary physician is Dr. Otilio Miu.  She has been evaluated by Dr. Merton Border with regards to COPD as recently as 24 July.  She is to have PFTs performed.  She has been maintained on Anoro Ellipta and albuterol as needed with regards to her COPD.  She states that since starting the Anoro she has not had to use her rescue inhaler hardly at all.  Her dyspnea has been significantly improved.  She has not had any other constitutional symptoms.  With regards of her LEFT lower lobe nodule this has shown significant growth currently at 1.5 x 1.4 cm, previously it measured 1 cm (CT scan of 8/5).  We have discussed the best way to obtain tissue diagnosis and in her situation it would be with electromagnetic navigation bronchoscopy under general anesthesia.  The patient however is reluctant to undergo any biopsies.  She states that she has been told by her primary care physician that she is too high risk from the pulmonary standpoint.  I discussed with the patient and my clinical impression I do not think that she is a prohibitive risk for general anesthesia for the purpose of ENB with biopsy.  However the patient is still reluctant to proceed with this.   I have reviewed independently, all of the radiographic data.  I have reviewed the patient's past medical history and surgical history.  Review of Systems  Constitutional: Negative.   HENT: Negative.   Respiratory: Positive for cough (Mostly mornings, no hemoptysis.  Clear sputum) and shortness of breath (Improved on Anoro).   Cardiovascular: Negative.     Gastrointestinal: Negative.   Endocrine: Negative.   Genitourinary: Negative.   Musculoskeletal: Negative.   Skin: Negative.   Allergic/Immunologic: Negative.   Neurological: Negative.   Psychiatric/Behavioral: Negative.   All other systems reviewed and are negative.      Objective:   Physical Exam vital signs and nursing note reviewed Gen: Obese (BMI 29.6) in NAD HEENT: NCAT, sclerae anicteric, nose/mouth/throat not examined due to masking requirements for COVID 19. Neck: No LAN, no JVD noted Lungs: Coarse BS,no wheezes or other adventitious sounds, good air entry bilaterally Cardiovascular: RRR, no M noted Abdomen: Soft, NT, +BS Ext: no C/C/E Neuro: PERRL, EOMI, motor/sensory grossly intact.  No overt focal deficits. Skin: No lesions noted Psych: Anxious, cooperative  Chest CT images as below, independently reviewed:      Assessment & Plan:   1.  LEFT lower lobe nodule, enlarging: This is in the setting of known adenocarcinoma of the lung stage IV currently on osimertinib.  The patient's previously noted mediastinal lymphadenopathy and axillary nodes appear to be responding to this treatment.  However this nodule appears to be enlarging.  Ideally, she should have a biopsy of this lesion.  The patient however is reluctant to undergo the procedure stating that she has been told that she would not tolerate the procedure.  My clinical impression is that her COPD appears to be well compensated at present and the procedure is minimally invasive although done under general anesthesia for  her protection, but nevertheless she should be able to tolerate it.  She does not have any other comorbid reason not to undergo the procedure.  However the patient would like to get more information she is leaning towards SBRT without diagnosis.  I have however discouraged this as we could be dealing with a second primary type of tumor and she appears to be responding otherwise to the osimertinib.  Since  she continues to feel somewhat apprehensive about this we will proceed with obtaining a PET/CT to try to delineate the extent of disease and the FDG avidity of this lesion.  By that time she should also have had her PFTs which will help risk stratify her for general anesthesia.  We will see her in follow-up after PET/CT has been completed.  2.  Stage IV adenocarcinoma of the lung, on osimertinib: This issue adds complexity to her management.  3.  Chronic obstructive pulmonary disease: Severity to be determined, likely mixed type.  Continue Anoro and albuterol as needed.  She appears to be very well compensated in this regard.  4.  Former smoker: She was commended on continued abstinence.    This chart was dictated using voice recognition software/Dragon.  Despite best efforts to proofread, errors can occur which can change the meaning.  Any change was purely unintentional.

## 2018-12-16 NOTE — Patient Instructions (Signed)
1.  We will schedule a PET/CT.  2.  Follow-up with me after the PET/CT  3.  Continue taking your Anoro 1 puff daily and albuterol as needed.

## 2018-12-17 ENCOUNTER — Encounter: Payer: Self-pay | Admitting: Pulmonary Disease

## 2018-12-23 ENCOUNTER — Ambulatory Visit: Payer: BLUE CROSS/BLUE SHIELD

## 2018-12-29 ENCOUNTER — Encounter
Admission: RE | Admit: 2018-12-29 | Discharge: 2018-12-29 | Disposition: A | Discharge status: Home / Self Care | Payer: BLUE CROSS/BLUE SHIELD | Source: Ambulatory Visit | Attending: Pulmonary Disease | Admitting: Pulmonary Disease

## 2018-12-29 ENCOUNTER — Other Ambulatory Visit: Payer: Self-pay

## 2018-12-29 DIAGNOSIS — R911 Solitary pulmonary nodule: Secondary | ICD-10-CM | POA: Diagnosis not present

## 2018-12-29 LAB — GLUCOSE, CAPILLARY: Glucose-Capillary: 93 mg/dL (ref 70–99)

## 2018-12-29 MED ORDER — FLUDEOXYGLUCOSE F - 18 (FDG) INJECTION
10.5300 | Freq: Once | INTRAVENOUS | Status: AC | PRN
  Administered 2018-12-29: 10.53 via INTRAVENOUS

## 2019-01-03 ENCOUNTER — Other Ambulatory Visit: Payer: Self-pay

## 2019-01-03 ENCOUNTER — Ambulatory Visit: Payer: BLUE CROSS/BLUE SHIELD | Admitting: Pulmonary Disease

## 2019-01-03 ENCOUNTER — Encounter: Payer: Self-pay | Admitting: Pulmonary Disease

## 2019-01-03 VITALS — BP 132/80 | HR 78 | Temp 97.8°F | Ht 69.0 in | Wt 206.0 lb

## 2019-01-03 DIAGNOSIS — Z87891 Personal history of nicotine dependence: Secondary | ICD-10-CM

## 2019-01-03 DIAGNOSIS — C3490 Malignant neoplasm of unspecified part of unspecified bronchus or lung: Secondary | ICD-10-CM | POA: Diagnosis not present

## 2019-01-03 DIAGNOSIS — R911 Solitary pulmonary nodule: Secondary | ICD-10-CM

## 2019-01-03 DIAGNOSIS — J449 Chronic obstructive pulmonary disease, unspecified: Secondary | ICD-10-CM

## 2019-01-03 NOTE — Progress Notes (Signed)
Subjective:    Patient ID: Kristina Wright, female    DOB: 12-19-1955, 63 y.o.   MRN: [id number]  HPI Kristina Wright is a 63 year old former smoker (quit 11/2017) whom we evaluated on 27 August for potential navigational bronchoscopy and biopsy of a lung nodule on the LEFT lower lobe that had been growing over time.  The patient has known adenocarcinoma of the lung stage IV and is on osimertinib.  She has been doing well in this regard.  She is followed by Dr. Nolon Stalls for these issues.  Previously she followed with Dr. Gwendolyn Lima with regards to her COPD.  I have now assumed her care since Dr. Alva Garnet has left the practice.  At her initial visit she was ambivalent as to whether she wanted to have a biopsy and opted to have another PET/CT to evaluate the enlarging nodule.  She presents today for follow-up of this and for further discussion as to how to proceed from here.  Recall that during her previous appointment she has stated that she been told that her lungs were too bad to undergo general anesthesia.  I certainly do not think that her function is prohibitive however she is apprehensive about this.  She had the PET/CT performed on September 9.  Showed that the LEFT lower lobe superior segment nodule has an SUV max of 8.0 it is highly suspicious for carcinoma.  The remainder of her adenocarcinoma appears to be responding well as there are no other enhancing lesions and/or previously noted nodules have regressed.  The LEFT lower lobe superior segment nodule is highly suspicious for a metachronous primary bronchogenic malignancy.  All of this was explained to the patient and the films were shown to her.  She is still conflicted as to what to do and would like to be considered for potential SBRT without biopsy.  I will certainly confer with Dr. Mike Gip and Dr. Baruch Gouty in this regard.  Since her last visit she has no new complaint.  She is actually well compensated with regards to her COPD with her  current inhaler therapy.  Review of Systems  Constitutional: Negative.   HENT: Negative.   Respiratory: Positive for cough (Mostly mornings, no hemoptysis.) and shortness of breath (Improved).   Cardiovascular: Negative.   Gastrointestinal: Negative.   Endocrine: Negative.   Genitourinary: Negative.   Musculoskeletal: Negative.   Skin: Negative.   Allergic/Immunologic: Negative.   Neurological: Negative.   Psychiatric/Behavioral: Negative.   All other systems reviewed and are negative.      Objective:   Physical Exam Vital signs and nursing note reviewed ATF:TDDUK (BMI 30.6)in NAD HEENT: NCAT, sclerae anicteric, nose/mouth/throat not examined due to masking requirements for COVID 19. Neck: No LAN, no JVD noted Lungs: Coarse BS,nowheezes or otheradventitious sounds, good air entry bilaterally Cardiovascular:RRR, no M noted Abdomen: Soft, NT, +BS Ext: no C/C/E Neuro: PERRL, EOMI, motor/sensory grossly intact.  No overt focal deficits. Skin: No lesions noted Psych: Anxious, cooperative Unchanged from prior.     Assessment & Plan:  1.  LEFT lower lobe nodule, enlarging: This is in the setting of known adenocarcinoma of the lung stage IV currently on osimertinib.    This nodule has high FDG avidity and is suspicious for a metachronous lesion.  Ideally she should undergo bronchoscopy with biopsy to determine best therapy.  However, the patient is apprehensive about this and would like to explore potential SBRT without biopsy.  She is not a candidate for surgery.  We will  discussed with Dr. Mike Gip and Dr. Baruch Gouty for the feasibility of this option, and have the patient reconvene after this has been done.  2.  Stage IV adenocarcinoma of the lung, on osimertinib: This issue adds complexity to her management.  By the PET/CT she appears to be responding to osimertinib, continue therapy directed by Dr. Mike Gip.  3.  Chronic obstructive pulmonary disease: Severity to be  determined, likely mixed type.  Continue Anoro and albuterol as needed.  She appears to be very well compensated in this regard.  4.  Former smoker: She was commended on continued abstinence.   This chart was dictated using voice recognition software/Dragon.  Despite best efforts to proofread, errors can occur which can change the meaning.  Any change was purely unintentional.

## 2019-01-03 NOTE — Patient Instructions (Signed)
1.  I will discuss the findings of the PET scan with Dr. Mike Gip and Dr. Baruch Gouty.  2.  We will see him in follow-up in 3 weeks time  3.  Continue using your Anoro.

## 2019-01-17 MED FILL — TAGRISSO 80 MG TABLET: 80 | 30 days supply | Qty: 30 | Fill #4

## 2019-01-21 ENCOUNTER — Ambulatory Visit: Payer: BLUE CROSS/BLUE SHIELD | Admitting: Pulmonary Disease

## 2019-01-21 ENCOUNTER — Other Ambulatory Visit: Payer: Self-pay

## 2019-01-21 ENCOUNTER — Encounter: Payer: Self-pay | Admitting: Pulmonary Disease

## 2019-01-21 VITALS — BP 132/78 | HR 79 | Temp 97.8°F | Ht 69.0 in | Wt 207.0 lb

## 2019-01-21 DIAGNOSIS — J449 Chronic obstructive pulmonary disease, unspecified: Secondary | ICD-10-CM

## 2019-01-21 DIAGNOSIS — Z87891 Personal history of nicotine dependence: Secondary | ICD-10-CM | POA: Diagnosis not present

## 2019-01-21 DIAGNOSIS — R911 Solitary pulmonary nodule: Secondary | ICD-10-CM

## 2019-01-21 DIAGNOSIS — C3490 Malignant neoplasm of unspecified part of unspecified bronchus or lung: Secondary | ICD-10-CM

## 2019-01-21 NOTE — Patient Instructions (Addendum)
1.  We will set an appointment with Dr. Baruch Gouty in [facility]iation [facility]) for SBRT (radiation) to the nodule in your lung  2.  Continue Anoro 1 inhalation daily  3.  We will see you in follow-up in 3 months time.  Call sooner should any new difficulties arise

## 2019-01-26 ENCOUNTER — Encounter: Payer: Self-pay | Admitting: Pulmonary Disease

## 2019-01-26 NOTE — Progress Notes (Signed)
   Subjective:    Patient ID: Kristina Wright, female    DOB: 22-Jul-1955, 63 y.o.   MRN: [id number]  HPI Kristina Wright is a 63 year old former smoker (quit 11/2017) whom we evaluated on 27 August for potential navigational bronchoscopy and biopsy of a lung nodule on the LEFT lower lobe that had been growing over time.  The patient has known adenocarcinoma of the lung stage IV and is on osimertinib.  She has been doing well in this regard.  She is followed by Dr. Nolon Stalls for these issues.  Previously she followed with Dr. Merton Border with regards to her COPD.  I have now assumed her care since Dr. Alva Garnet has left the practice.    She had a PET/CT to evaluate this LEFT lower lobe nodule.  This has shown that this lesion has an SUV of 8.0.  In addition, it was noted that the rest of her disease appeared to be responded to the osimertinib so it appears that this lesion is a metachronous lesion.  She has been ambivalent about proceeding for biopsy of this lesion by navigational bronchoscopy.  She is actually reluctant to undergo biopsy at all.  She has inquired as to going directly to SBRT using the data from the PET/CT.  I have discussed the case with Dr. Mike Gip and Dr. Baruch Gouty and given that she is reluctant to undergo biopsy Dr. Baruch Gouty believes that she is a candidate for SBRT.  The process of biopsy via navigational bronchoscopy was again discussed with her however she remains apprehensive about this process.  Therefore she will be referred to Dr. Donella Stade for biopsy.  Review of Systems A 10 point review of systems was performed and is only notable for dyspnea on exertion which has not changed in quality and occasional morning cough also and change in quality.  All other systems were negative.    Objective:   Physical Exam Examination reveals no significant change from prior Patient is awake and alert.  No distress. Coarse breath sounds without wheezes. Cardiac examination without murmurs,  regular rate and rhythm No lower extremity edema. Neurologic exam grossly without deficit.    Assessment & Plan:  1. LEFT lower lobe nodule, enlarging:This is in the setting of known adenocarcinoma of the lung stage IV currently on osimertinib.   This nodule has high FDG avidity and is suspicious for a metachronous lesion.  Ideally she should undergo bronchoscopy with biopsy to determine best therapy.  The patient continues to decline biopsy.  Will be referred to Dr. Donella Stade for consideration of SBRT.    2. Stage IV adenocarcinoma of the lung, on osimertinib:This issue adds complexity to her management.  By the PET/CT she appears to be responding to osimertinib, continue therapy directed by Dr. Mike Gip.  3. Chronic obstructive pulmonary disease:Severity to be determined, likely mixed type. Continue Anoro and albuterol as needed. She appears to be very well compensated in this regard.  4. Former smoker:Continues to be abstinent.   This chart was dictated using voice recognition software/Dragon.  Despite best efforts to proofread, errors can occur which can change the meaning.  Any change was purely unintentional.

## 2019-01-28 ENCOUNTER — Other Ambulatory Visit: Payer: Self-pay

## 2019-01-31 ENCOUNTER — Ambulatory Visit
Admission: RE | Admit: 2019-01-31 | Discharge: 2019-01-31 | Disposition: A | Discharge status: Home / Self Care | Payer: BLUE CROSS/BLUE SHIELD | Source: Ambulatory Visit | Attending: Radiation Oncology | Admitting: Radiation Oncology

## 2019-01-31 ENCOUNTER — Other Ambulatory Visit: Payer: Self-pay

## 2019-01-31 ENCOUNTER — Encounter: Payer: Self-pay | Admitting: Radiation Oncology

## 2019-01-31 VITALS — BP 141/71 | HR 73 | Temp 98.2°F | Resp 16 | Wt 210.1 lb

## 2019-01-31 DIAGNOSIS — E079 Disorder of thyroid, unspecified: Secondary | ICD-10-CM | POA: Diagnosis not present

## 2019-01-31 DIAGNOSIS — E785 Hyperlipidemia, unspecified: Secondary | ICD-10-CM | POA: Insufficient documentation

## 2019-01-31 DIAGNOSIS — R911 Solitary pulmonary nodule: Secondary | ICD-10-CM

## 2019-01-31 DIAGNOSIS — Z79899 Other long term (current) drug therapy: Secondary | ICD-10-CM | POA: Insufficient documentation

## 2019-01-31 DIAGNOSIS — Z7982 Long term (current) use of aspirin: Secondary | ICD-10-CM | POA: Insufficient documentation

## 2019-01-31 DIAGNOSIS — I10 Essential (primary) hypertension: Secondary | ICD-10-CM | POA: Insufficient documentation

## 2019-01-31 DIAGNOSIS — C773 Secondary and unspecified malignant neoplasm of axilla and upper limb lymph nodes: Secondary | ICD-10-CM | POA: Insufficient documentation

## 2019-01-31 DIAGNOSIS — F329 Major depressive disorder, single episode, unspecified: Secondary | ICD-10-CM | POA: Diagnosis not present

## 2019-01-31 DIAGNOSIS — Z803 Family history of malignant neoplasm of breast: Secondary | ICD-10-CM | POA: Insufficient documentation

## 2019-01-31 DIAGNOSIS — C3432 Malignant neoplasm of lower lobe, left bronchus or lung: Secondary | ICD-10-CM | POA: Diagnosis not present

## 2019-01-31 DIAGNOSIS — Z87891 Personal history of nicotine dependence: Secondary | ICD-10-CM | POA: Insufficient documentation

## 2019-01-31 DIAGNOSIS — Z800 Family history of malignant neoplasm of digestive organs: Secondary | ICD-10-CM | POA: Diagnosis not present

## 2019-01-31 NOTE — Consult Note (Signed)
NEW PATIENT EVALUATION  Name: Kristina Wright  MRN: [id number]  Date:   01/31/2019     DOB: 02-Dec-1955   This 63 y.o. female patient presents to the clinic for initial evaluation of left lower lobe hypermetabolic pulmonary nodule in patient with known stage IV adenocarcinoma of the lung currently on immunotherapy.  REFERRING PHYSICIAN: Juline Patch, MD  CHIEF COMPLAINT:  Chief Complaint  Patient presents with  . Lung Cancer    Initial consultation    DIAGNOSIS: The encounter diagnosis was Left lower lobe pulmonary nodule.   PREVIOUS INVESTIGATIONS:  PET CT scan reviewed Clinical notes reviewed Pathology reviewed  HPI: Patient is a 63 year old female who has been under treatment for stage IV adenocarcinoma of the lung currently on  osimertinib.  At presentation she had a right axillary lymph node which was biopsied and positive for metastatic adenocarcinoma of lung primary.  They have been following a left lower lobe pulmonary nodule as well as mediastinal adenopathy which has improved.  The left lower lobe nodule is hypermetabolic on PET scan and is slowly progressing.  She has refused biopsy in the past and is seen today for consideration of treatment to the left lower lobe pulmonary nodule.  She specifically [place] cough hemoptysis chest tightness or dysphasia at this time.  PLANNED TREATMENT REGIMEN: SBRT  PAST MEDICAL HISTORY:  has a past medical history of Cancer ([place]) (1989), Depression, Family history of breast cancer, Family history of colon cancer, Family history of ovarian cancer, Family history of stomach cancer, Family history of throat cancer, Hyperlipidemia, Hypertension, Malignant neoplasm of lung ([place]) (03/01/2018), and Thyroid disease.    PAST SURGICAL HISTORY:  Past Surgical History:  Procedure Laterality Date  . BILATERAL CARPAL TUNNEL RELEASE    . BREAST CYST ASPIRATION Left   . LUNG BIOPSY    . PARTIAL HYSTERECTOMY    . PORTA CATH INSERTION N/A 03/03/2018   Procedure: PORTA CATH INSERTION;  Surgeon: Katha Cabal, MD;  Location: [facility];  Service: Cardiovascular;  Laterality: N/A;  . TUBAL LIGATION      FAMILY HISTORY: family history includes Breast cancer (age of onset: 10) in her sister; Colon cancer (age of onset: 56) in her maternal grandmother and mother; Heart attack in her maternal aunt; Hypertension in her father; Ovarian cancer (age of onset: 68) in her maternal aunt; Stomach cancer in her paternal grandmother; Throat cancer in her brother and brother.  SOCIAL HISTORY:  reports that she quit smoking about 14 months ago. Her smoking use included cigarettes. She has a 25.00 pack-year smoking history. She has never used smokeless tobacco. She reports current drug use. Drug: Marijuana. She reports that she does not drink alcohol.  ALLERGIES: Patient has no known allergies.  MEDICATIONS:  Current Outpatient Medications  Medication Sig Dispense Refill  . ALPRAZolam (XANAX) 0.25 MG tablet Take 1 tablet (0.25 mg total) by mouth at bedtime as needed. for anxiety 30 tablet 5  . aspirin EC 81 MG tablet Take 81 mg by mouth daily.    Marland Kitchen atorvastatin (LIPITOR) 10 MG tablet Take 1 tablet by mouth once daily 90 tablet 0  . busPIRone (BUSPAR) 5 MG tablet Take 1 tablet (5 mg total) by mouth daily. 90 tablet 1  . clopidogrel (PLAVIX) 75 MG tablet TAKE 1 TABLET BY MOUTH ONCE A DAY 90 tablet 0  . EUTHYROX 75 MCG tablet Take 1 tablet by mouth once daily 90 tablet 0  . FLUoxetine (PROZAC) 10 MG capsule Take  1 capsule (10 mg total) by mouth daily. 90 capsule 1  . hydrOXYzine (ATARAX/VISTARIL) 10 MG tablet Take 1 tablet (10 mg total) by mouth 3 (three) times daily as needed. 30 tablet 2  . lidocaine-prilocaine (EMLA) cream Apply to affected area once 30 g 3  . lisinopril-hydrochlorothiazide (ZESTORETIC) 10-12.5 MG tablet Take 1 tablet by mouth once daily 90 tablet 0  . meclizine (ANTIVERT) 25 MG tablet Take 1 tablet (25 mg total) by mouth 3  (three) times daily as needed for dizziness. 30 tablet 0  . Multiple Vitamins-Minerals (MULTIVITAMIN ADULT PO) Take 1 tablet by mouth daily.     Marland Kitchen olopatadine (PATANOL) 0.1 % ophthalmic solution Place 1 drop into both eyes 2 (two) times daily. 5 mL 12  . ondansetron (ZOFRAN) 8 MG tablet Take 1 tablet (8 mg total) by mouth 2 (two) times daily as needed (Nausea or vomiting). Start if needed on the third day after chemotherapy. 30 tablet 1  . osimertinib mesylate (TAGRISSO) 80 MG tablet TAKE 1 TABLET (80 MG TOTAL) BY MOUTH DAILY. 30 tablet 2  . prochlorperazine (COMPAZINE) 10 MG tablet Take 1 tablet (10 mg total) by mouth every 6 (six) hours as needed (Nausea or vomiting). 30 tablet 1  . triamcinolone cream (KENALOG) 0.1 % Apply 1 application topically 2 (two) times daily. 30 g 1  . umeclidinium-vilanterol (ANORO ELLIPTA) 62.5-25 MCG/INH AEPB Inhale 1 puff into the lungs daily. 60 each 5  . VENTOLIN HFA 108 (90 Base) MCG/ACT inhaler INHALE 2 PUFFS BY MOUTH EVERY 6 HOURS AS NEEDED FOR WHEEZING FOR SHORTNESS OF BREATH 18 g 0   No current facility-administered medications for this encounter.     ECOG PERFORMANCE STATUS:  0 - Asymptomatic  REVIEW OF SYSTEMS: Patient denies any weight loss, fatigue, weakness, fever, chills or night sweats. Patient denies any loss of vision, blurred vision. Patient denies any ringing  of the ears or hearing loss. No irregular heartbeat. Patient denies heart murmur or history of fainting. Patient denies any chest pain or pain radiating to her upper extremities. Patient denies any shortness of breath, difficulty breathing at night, cough or hemoptysis. Patient denies any swelling in the lower legs. Patient denies any nausea vomiting, vomiting of blood, or coffee ground material in the vomitus. Patient denies any stomach pain. Patient states has had normal bowel movements no significant constipation or diarrhea. Patient denies any dysuria, hematuria or significant nocturia.  Patient denies any problems walking, swelling in the joints or loss of balance. Patient denies any skin changes, loss of hair or loss of weight. Patient denies any excessive worrying or anxiety or significant depression. Patient denies any problems with insomnia. Patient denies excessive thirst, polyuria, polydipsia. Patient denies any swollen glands, patient denies easy bruising or easy bleeding. Patient denies any recent infections, allergies or URI. Patient "s visual fields have not changed significantly in recent time.   PHYSICAL EXAM: BP (!) 141/71 (BP Location: Left Arm, Patient Position: Sitting)   Pulse 73   Temp 98.2 F (36.8 C) (Tympanic)   Resp 16   Wt 210 lb 1.6 oz (95.3 kg)   BMI 31.03 kg/m  Well-developed well-nourished patient in NAD. HEENT reveals PERLA, EOMI, discs not visualized.  Oral cavity is clear. No oral mucosal lesions are identified. Neck is clear without evidence of cervical or supraclavicular adenopathy. Lungs are clear to A&P. Cardiac examination is essentially unremarkable with regular rate and rhythm without murmur rub or thrill. Abdomen is benign with no organomegaly or masses  noted. Motor sensory and DTR levels are equal and symmetric in the upper and lower extremities. Cranial nerves II through XII are grossly intact. Proprioception is intact. No peripheral adenopathy or edema is identified. No motor or sensory levels are noted. Crude visual fields are within normal range.  LABORATORY DATA: Pathology report reviewed    RADIOLOGY RESULTS: PET CT scan and CT scans reviewed   IMPRESSION: Left lower lobe non-small cell lung cancer in 63 year old female with history of stage IV adenocarcinoma of the lung  PLAN: At this time I believe were dealing with a second lung primary.  Based on her current history of stage IV adenocarcinoma, slow growth of the lesion and PET positive lesion would favor going ahead with SBRT to the left lower lobe.  I would plan on delivering  6000 cGy in 5 fractions.  Risks and benefits of treatment including possible development of cough fatigue skin reaction alteration of blood counts all were discussed in detail with the patient.  She seems to comprehend my treatment plan well.  I have personally set up and ordered CT simulation for later this week.  We will use for dimensional treatment planning as well as motion restriction during treatment planning.  Also use PET CT fusion study.  Patient comprehends my treatment plan well.  I would like to take this opportunity to thank you for allowing me to participate in the care of your patient.Noreene Filbert, MD

## 2019-02-02 ENCOUNTER — Other Ambulatory Visit: Payer: Self-pay | Admitting: Family Medicine

## 2019-02-02 DIAGNOSIS — E782 Mixed hyperlipidemia: Secondary | ICD-10-CM

## 2019-02-02 DIAGNOSIS — E039 Hypothyroidism, unspecified: Secondary | ICD-10-CM

## 2019-02-03 ENCOUNTER — Ambulatory Visit
Admission: RE | Admit: 2019-02-03 | Discharge: 2019-02-03 | Disposition: A | Discharge status: Home / Self Care | Payer: BLUE CROSS/BLUE SHIELD | Source: Ambulatory Visit | Attending: Radiation Oncology | Admitting: Radiation Oncology

## 2019-02-03 ENCOUNTER — Other Ambulatory Visit: Payer: Self-pay

## 2019-02-03 DIAGNOSIS — Z510 Encounter for antineoplastic radiation therapy: Secondary | ICD-10-CM | POA: Insufficient documentation

## 2019-02-03 DIAGNOSIS — C3432 Malignant neoplasm of lower lobe, left bronchus or lung: Secondary | ICD-10-CM | POA: Insufficient documentation

## 2019-02-04 DIAGNOSIS — Z510 Encounter for antineoplastic radiation therapy: Secondary | ICD-10-CM | POA: Diagnosis not present

## 2019-02-08 ENCOUNTER — Telehealth: Payer: Self-pay | Admitting: Pulmonary Disease

## 2019-02-08 NOTE — Telephone Encounter (Signed)
Will route to Rhonda.  

## 2019-02-08 NOTE — Telephone Encounter (Signed)
Spoke with Kristina Wright in Cardiopulmonary and PFT has been canceled. Pt states she will call back to R/S.  Nothing else needed at this time. [name]

## 2019-02-08 NOTE — Telephone Encounter (Signed)
Left message to relay date/time of covid test. 02/09/2019 prior to 11:[facility]  

## 2019-02-08 NOTE — Telephone Encounter (Signed)
Patient states would like to cancel PFT. Will call back to reschedule.  Patient phone number is [phone].

## 2019-02-09 ENCOUNTER — Other Ambulatory Visit: Admission: RE | Admit: 2019-02-09 | Payer: BLUE CROSS/BLUE SHIELD | Source: Ambulatory Visit

## 2019-02-10 ENCOUNTER — Ambulatory Visit: Payer: BLUE CROSS/BLUE SHIELD

## 2019-02-14 ENCOUNTER — Ambulatory Visit
Admission: RE | Admit: 2019-02-14 | Discharge: 2019-02-14 | Disposition: A | Discharge status: Home / Self Care | Payer: BLUE CROSS/BLUE SHIELD | Source: Ambulatory Visit | Attending: Radiation Oncology | Admitting: Radiation Oncology

## 2019-02-14 ENCOUNTER — Other Ambulatory Visit: Payer: Self-pay

## 2019-02-14 DIAGNOSIS — Z510 Encounter for antineoplastic radiation therapy: Secondary | ICD-10-CM | POA: Diagnosis not present

## 2019-02-14 MED FILL — TAGRISSO 80 MG TABLET: 80 | 30 days supply | Qty: 30 | Fill #5

## 2019-02-16 ENCOUNTER — Other Ambulatory Visit: Payer: Self-pay

## 2019-02-16 ENCOUNTER — Ambulatory Visit
Admission: RE | Admit: 2019-02-16 | Discharge: 2019-02-16 | Disposition: A | Discharge status: Home / Self Care | Payer: BLUE CROSS/BLUE SHIELD | Source: Ambulatory Visit | Attending: Radiation Oncology | Admitting: Radiation Oncology

## 2019-02-16 DIAGNOSIS — Z510 Encounter for antineoplastic radiation therapy: Secondary | ICD-10-CM | POA: Diagnosis not present

## 2019-02-20 ENCOUNTER — Other Ambulatory Visit: Payer: Self-pay | Admitting: Family Medicine

## 2019-02-20 DIAGNOSIS — F419 Anxiety disorder, unspecified: Secondary | ICD-10-CM

## 2019-02-21 ENCOUNTER — Ambulatory Visit
Admission: RE | Admit: 2019-02-21 | Discharge: 2019-02-21 | Disposition: A | Discharge status: Home / Self Care | Payer: BLUE CROSS/BLUE SHIELD | Source: Ambulatory Visit | Attending: Radiation Oncology | Admitting: Radiation Oncology

## 2019-02-21 ENCOUNTER — Other Ambulatory Visit: Payer: Self-pay

## 2019-02-21 DIAGNOSIS — Z510 Encounter for antineoplastic radiation therapy: Secondary | ICD-10-CM | POA: Diagnosis not present

## 2019-02-21 DIAGNOSIS — C3432 Malignant neoplasm of lower lobe, left bronchus or lung: Secondary | ICD-10-CM | POA: Insufficient documentation

## 2019-02-23 ENCOUNTER — Other Ambulatory Visit: Payer: Self-pay

## 2019-02-23 ENCOUNTER — Ambulatory Visit
Admission: RE | Admit: 2019-02-23 | Discharge: 2019-02-23 | Disposition: A | Discharge status: Home / Self Care | Payer: BLUE CROSS/BLUE SHIELD | Source: Ambulatory Visit | Attending: Radiation Oncology | Admitting: Radiation Oncology

## 2019-02-23 DIAGNOSIS — Z510 Encounter for antineoplastic radiation therapy: Secondary | ICD-10-CM | POA: Diagnosis not present

## 2019-02-28 ENCOUNTER — Ambulatory Visit
Admission: RE | Admit: 2019-02-28 | Discharge: 2019-02-28 | Disposition: A | Discharge status: Home / Self Care | Payer: BLUE CROSS/BLUE SHIELD | Source: Ambulatory Visit | Attending: Radiation Oncology | Admitting: Radiation Oncology

## 2019-02-28 ENCOUNTER — Other Ambulatory Visit: Payer: Self-pay

## 2019-02-28 DIAGNOSIS — Z510 Encounter for antineoplastic radiation therapy: Secondary | ICD-10-CM | POA: Diagnosis not present

## 2019-03-03 ENCOUNTER — Inpatient Hospital Stay (HOSPITAL_COMMUNITY): Admit: 2019-03-03 | Payer: BLUE CROSS/BLUE SHIELD

## 2019-03-03 ENCOUNTER — Encounter: Payer: Self-pay | Admitting: Family Medicine

## 2019-03-03 ENCOUNTER — Ambulatory Visit (INDEPENDENT_AMBULATORY_CARE_PROVIDER_SITE_OTHER): Payer: BLUE CROSS/BLUE SHIELD | Admitting: Family Medicine

## 2019-03-03 ENCOUNTER — Other Ambulatory Visit: Payer: Self-pay

## 2019-03-03 VITALS — BP 130/70 | HR 64 | Ht 69.0 in | Wt 208.0 lb

## 2019-03-03 DIAGNOSIS — J449 Chronic obstructive pulmonary disease, unspecified: Secondary | ICD-10-CM

## 2019-03-03 DIAGNOSIS — Z124 Encounter for screening for malignant neoplasm of cervix: Secondary | ICD-10-CM

## 2019-03-03 DIAGNOSIS — B3731 Acute candidiasis of vulva and vagina: Secondary | ICD-10-CM

## 2019-03-03 DIAGNOSIS — F419 Anxiety disorder, unspecified: Secondary | ICD-10-CM

## 2019-03-03 DIAGNOSIS — Z0000 Encounter for general adult medical examination without abnormal findings: Secondary | ICD-10-CM

## 2019-03-03 DIAGNOSIS — E039 Hypothyroidism, unspecified: Secondary | ICD-10-CM | POA: Diagnosis not present

## 2019-03-03 DIAGNOSIS — J441 Chronic obstructive pulmonary disease with (acute) exacerbation: Secondary | ICD-10-CM

## 2019-03-03 DIAGNOSIS — Z01419 Encounter for gynecological examination (general) (routine) without abnormal findings: Secondary | ICD-10-CM | POA: Diagnosis not present

## 2019-03-03 DIAGNOSIS — I10 Essential (primary) hypertension: Secondary | ICD-10-CM

## 2019-03-03 DIAGNOSIS — E782 Mixed hyperlipidemia: Secondary | ICD-10-CM

## 2019-03-03 DIAGNOSIS — I739 Peripheral vascular disease, unspecified: Secondary | ICD-10-CM

## 2019-03-03 DIAGNOSIS — F330 Major depressive disorder, recurrent, mild: Secondary | ICD-10-CM

## 2019-03-03 DIAGNOSIS — B373 Candidiasis of vulva and vagina: Secondary | ICD-10-CM

## 2019-03-03 MED ORDER — LISINOPRIL-HYDROCHLOROTHIAZIDE 10-12.5 MG PO TABS: 1.0000 | ORAL_TABLET | Freq: Every day | ORAL | 1 refills | Status: DC

## 2019-03-03 MED ORDER — LEVOTHYROXINE SODIUM 75 MCG PO TABS: 75.0000 ug | ORAL_TABLET | Freq: Every day | ORAL | 1 refills | Status: DC

## 2019-03-03 MED ORDER — BUSPIRONE HCL 5 MG PO TABS: 5.0000 mg | ORAL_TABLET | Freq: Every day | ORAL | 1 refills | Status: DC

## 2019-03-03 MED ORDER — VENTOLIN HFA 108 (90 BASE) MCG/ACT IN AERS: INHALATION_SPRAY | RESPIRATORY_TRACT | 11 refills | Status: DC

## 2019-03-03 MED ORDER — FLUOXETINE HCL 10 MG PO CAPS: 10.0000 mg | ORAL_CAPSULE | Freq: Every day | ORAL | 1 refills | Status: DC

## 2019-03-03 MED ORDER — ATORVASTATIN CALCIUM 10 MG PO TABS: 10.0000 mg | ORAL_TABLET | Freq: Every day | ORAL | 1 refills | Status: DC

## 2019-03-03 MED ORDER — ALPRAZOLAM 0.25 MG PO TABS: 0.2500 mg | ORAL_TABLET | Freq: Every evening | ORAL | 5 refills | Status: DC | PRN

## 2019-03-03 MED ORDER — ANORO ELLIPTA 62.5-25 MCG/INH IN AEPB: 1.0000 | INHALATION_SPRAY | Freq: Every day | RESPIRATORY_TRACT | 5 refills | Status: DC

## 2019-03-03 MED ORDER — CLOPIDOGREL BISULFATE 75 MG PO TABS: 75.0000 mg | ORAL_TABLET | Freq: Every day | ORAL | 1 refills | Status: DC

## 2019-03-03 MED ORDER — NYSTATIN 100000 UNIT/GM EX CREA: 1.0000 "application " | TOPICAL_CREAM | Freq: Two times a day (BID) | CUTANEOUS | 0 refills | Status: DC

## 2019-03-03 NOTE — Progress Notes (Signed)
Date:  03/03/2019   Name:  Kristina Wright   DOB:  61-14-1957   MRN:  [id number]   Chief Complaint: Annual Exam (needs pap), Anxiety, Depression (PHQ9=1 and GAD7=4), Hypertension, and COPD  Anxiety Presents for follow-up visit. Symptoms include depressed mood, excessive worry and nervous/anxious behavior. Patient reports no chest pain, decreased concentration, dizziness, insomnia, irritability, nausea, palpitations, restlessness, shortness of breath or suicidal ideas. Symptoms occur most days. The quality of sleep is good.    Depression        The current episode started more than 1 year ago.   The problem has been gradually improving since onset.  Associated symptoms include decreased interest and sad.  Associated symptoms include no decreased concentration, no fatigue, no helplessness, no hopelessness, does not have insomnia, not irritable, no restlessness, no appetite change, no body aches, no myalgias, no headaches, no indigestion and no suicidal ideas.  Past treatments include SSRIs - Selective serotonin reuptake inhibitors.  Compliance with treatment is good.  Past medical history includes thyroid problem and anxiety.     Pertinent negatives include no hypothyroidism. Hypertension This is a chronic problem. The current episode started more than 1 year ago. The problem is controlled. Associated symptoms include anxiety. Pertinent negatives include no blurred vision, chest pain, headaches, malaise/fatigue, neck pain, orthopnea, palpitations, peripheral edema, PND, shortness of breath or sweats. Risk factors for coronary artery disease include dyslipidemia and smoking/tobacco exposure. Past treatments include ACE inhibitors and diuretics. The current treatment provides moderate improvement. There are no compliance problems.  There is no history of angina, kidney disease, CAD/MI, CVA, heart failure, left ventricular hypertrophy, PVD or retinopathy. Identifiable causes of hypertension include a  thyroid problem. There is no history of chronic renal disease, a hypertension causing med or renovascular disease.  COPD There is no chest tightness, cough, difficulty breathing, frequent throat clearing, hemoptysis, hoarse voice, shortness of breath, sputum production or wheezing. This is a new problem. The problem occurs intermittently. Pertinent negatives include no appetite change, chest pain, ear pain, fever, headaches, malaise/fatigue, myalgias, PND, sore throat or sweats. Her past medical history is significant for COPD.  Hyperlipidemia This is a chronic problem. The current episode started more than 1 year ago. The problem is controlled. Recent lipid tests were reviewed and are normal. She has no history of chronic renal disease, diabetes, hypothyroidism, liver disease, obesity or nephrotic syndrome. There are no known factors aggravating her hyperlipidemia. Pertinent negatives include no chest pain, myalgias or shortness of breath. Current antihyperlipidemic treatment includes statins. The current treatment provides moderate improvement of lipids. There are no compliance problems.  Risk factors for coronary artery disease include dyslipidemia and hypertension.  Thyroid Problem Presents for follow-up visit. Symptoms include anxiety, depressed mood and weight gain. Patient reports no constipation, diarrhea, fatigue, hoarse voice or palpitations. The symptoms have been stable. Her past medical history is significant for hyperlipidemia. There is no history of diabetes or heart failure.    Lab Results  Component Value Date   CREATININE 0.95 11/29/2018   BUN 12 11/29/2018   NA 138 11/29/2018   K 3.7 11/29/2018   CL 104 11/29/2018   CO2 26 11/29/2018   Lab Results  Component Value Date   CHOL 169 01/22/2018   HDL 37 (L) 01/22/2018   LDLCALC 88 01/22/2018   TRIG 221 (H) 01/22/2018   Lab Results  Component Value Date   TSH 4.647 (H) 05/17/2018   No results found for: HGBA1C   Review  of Systems  Constitutional: Positive for weight gain. Negative for appetite change, chills, fatigue, fever, irritability and malaise/fatigue.  HENT: Negative for drooling, ear discharge, ear pain, hoarse voice and sore throat.   Eyes: Negative for blurred vision.  Respiratory: Negative for cough, hemoptysis, sputum production, shortness of breath and wheezing.   Cardiovascular: Negative for chest pain, palpitations, orthopnea, leg swelling and PND.  Gastrointestinal: Negative for abdominal pain, blood in stool, constipation, diarrhea and nausea.  Endocrine: Negative for polydipsia.  Genitourinary: Negative for dysuria, frequency, hematuria and urgency.  Musculoskeletal: Negative for back pain, myalgias and neck pain.  Skin: Negative for rash.  Allergic/Immunologic: Negative for environmental allergies.  Neurological: Negative for dizziness and headaches.  Hematological: Does not bruise/bleed easily.  Psychiatric/Behavioral: Positive for depression. Negative for decreased concentration and suicidal ideas. The patient is nervous/anxious. The patient does not have insomnia.     Patient Active Problem List   Diagnosis Date Noted  . Chronic obstructive pulmonary disease ([place]) 12/13/2018  . Left lower lobe pulmonary nodule 11/29/2018  . Breast nodule 08/25/2018  . Anxiety 08/22/2018  . Genetic testing 06/11/2018  . Family history of breast cancer   . Family history of colon cancer   . Family history of ovarian cancer   . Family history of stomach cancer   . Family history of throat cancer   . Metastatic adenocarcinoma to lung with unknown primary site [facility]) 04/27/2018  . Depression 03/12/2018  . Malignant neoplasm of lung ([place]) 03/01/2018  . Goals of care, counseling/discussion 03/01/2018  . Tobacco use disorder 02/08/2016  . Essential hypertension 02/08/2016  . PVD (peripheral vascular disease) ([place]) 02/08/2016  . Blue toe syndrome of right lower extremity ([place]) 02/08/2016    No  Known Allergies  Past Surgical History:  Procedure Laterality Date  . BILATERAL CARPAL TUNNEL RELEASE    . BREAST CYST ASPIRATION Left   . LUNG BIOPSY    . PARTIAL HYSTERECTOMY    . PORTA CATH INSERTION N/A 03/03/2018   Procedure: PORTA CATH INSERTION;  Surgeon: Katha Cabal, MD;  Location: [facility];  Service: Cardiovascular;  Laterality: N/A;  . TUBAL LIGATION      Social History   Tobacco Use  . Smoking status: Former Smoker    Packs/day: 1.00    Years: 25.00    Pack years: 25.00    Types: Cigarettes    Quit date: 11/19/2017    Years since quitting: 1.2  . Smokeless tobacco: Never Used  Substance Use Topics  . Alcohol use: No  . Drug use: Yes    Types: Marijuana    Comment: occasional     Medication list has been reviewed and updated.  Current Meds  Medication Sig  . ALPRAZolam (XANAX) 0.25 MG tablet Take 1 tablet (0.25 mg total) by mouth at bedtime as needed. for anxiety  . aspirin EC 81 MG tablet Take 81 mg by mouth daily.  Marland Kitchen atorvastatin (LIPITOR) 10 MG tablet Take 1 tablet by mouth once daily  . busPIRone (BUSPAR) 5 MG tablet Take 1 tablet (5 mg total) by mouth daily.  . clopidogrel (PLAVIX) 75 MG tablet TAKE 1 TABLET BY MOUTH ONCE A DAY  . EUTHYROX 75 MCG tablet Take 1 tablet by mouth once daily  . FLUoxetine (PROZAC) 10 MG capsule Take 1 capsule (10 mg total) by mouth daily.  . hydrOXYzine (ATARAX/VISTARIL) 10 MG tablet Take 1 tablet (10 mg total) by mouth 3 (three) times daily as needed.  . lidocaine-prilocaine (EMLA)  cream Apply to affected area once  . lisinopril-hydrochlorothiazide (ZESTORETIC) 10-12.5 MG tablet Take 1 tablet by mouth once daily  . Multiple Vitamins-Minerals (MULTIVITAMIN ADULT PO) Take 1 tablet by mouth daily.   Marland Kitchen olopatadine (PATANOL) 0.1 % ophthalmic solution Place 1 drop into both eyes 2 (two) times daily.  Marland Kitchen osimertinib mesylate (TAGRISSO) 80 MG tablet TAKE 1 TABLET (80 MG TOTAL) BY MOUTH DAILY.  Marland Kitchen triamcinolone cream  (KENALOG) 0.1 % Apply 1 application topically 2 (two) times daily.  Marland Kitchen umeclidinium-vilanterol (ANORO ELLIPTA) 62.5-25 MCG/INH AEPB Inhale 1 puff into the lungs daily.    PHQ 2/9 Scores 03/03/2019 12/13/2018 01/07/2018 09/29/2017  PHQ - 2 Score 0 0 1 2  PHQ- 9 Score 1 1 6 4     BP Readings from Last 3 Encounters:  03/03/19 130/70  01/31/19 (!) 141/71  01/21/19 132/78    Physical Exam Vitals signs and nursing note reviewed. Exam conducted with a chaperone present.  Constitutional:      General: She is not irritable.    Appearance: She is well-developed and normal weight.  HENT:     Head: Normocephalic.     Jaw: There is normal jaw occlusion.     Right Ear: Hearing, tympanic membrane, ear canal and external ear normal.     Left Ear: Hearing, tympanic membrane, ear canal and external ear normal.     Nose: Nose normal.     Mouth/Throat:     Lips: Pink.     Mouth: Mucous membranes are moist.  Eyes:     General: Lids are normal. Lids are everted, no foreign bodies appreciated. Gaze aligned appropriately. No scleral icterus.       Left eye: No foreign body or hordeolum.     Extraocular Movements: Extraocular movements intact.     Conjunctiva/sclera: Conjunctivae normal.     Right eye: Right conjunctiva is not injected.     Left eye: Left conjunctiva is not injected.     Pupils: Pupils are equal, round, and reactive to light.     Funduscopic exam:    Right eye: Red reflex present.        Left eye: Red reflex present. Neck:     Musculoskeletal: Full passive range of motion without pain, normal range of motion and neck supple.     Thyroid: No thyroid mass, thyromegaly or thyroid tenderness.     Vascular: No JVD.     Trachea: No tracheal deviation.  Cardiovascular:     Rate and Rhythm: Normal rate and regular rhythm.     Chest Wall: PMI is not displaced.     Pulses: Normal pulses.          Carotid pulses are 2+ on the right side and 2+ on the left side.      Radial pulses are 2+ on  the right side and 2+ on the left side.       Femoral pulses are 2+ on the right side and 2+ on the left side.      Popliteal pulses are 2+ on the right side and 2+ on the left side.       Dorsalis pedis pulses are 2+ on the right side and 2+ on the left side.       Posterior tibial pulses are 2+ on the right side and 2+ on the left side.     Heart sounds: Normal heart sounds, S1 normal and S2 normal. No murmur. No systolic murmur. No diastolic murmur. No friction rub.  No gallop. No S3 or S4 sounds.   Pulmonary:     Effort: Pulmonary effort is normal. No respiratory distress.     Breath sounds: Normal breath sounds. No decreased breath sounds, wheezing, rhonchi or rales.  Chest:     Breasts:        Right: Normal. No swelling, bleeding, inverted nipple, mass, nipple discharge, skin change or tenderness.        Left: Normal. No swelling, bleeding, inverted nipple, mass, nipple discharge, skin change or tenderness.  Abdominal:     General: Bowel sounds are normal. There is no distension.     Palpations: Abdomen is soft. There is no hepatomegaly, splenomegaly or mass.     Tenderness: There is no abdominal tenderness. There is no guarding or rebound.     Hernia: No hernia is present.  Genitourinary:    Exam position: Lithotomy position.     Pubic Area: No rash.      Labia:        Right: Rash present.        Left: Rash present.      Vagina: Normal.     Cervix: Friability present.     Uterus: Absent.      Adnexa: Right adnexa normal and left adnexa normal.     Rectum: Normal. Guaiac result negative. No mass or tenderness.  Musculoskeletal: Normal range of motion.        General: No tenderness.     Right lower leg: No edema.     Left lower leg: No edema.  Lymphadenopathy:     Head:     Right side of head: No submental or submandibular adenopathy.     Left side of head: No submental or submandibular adenopathy.     Cervical: No cervical adenopathy.     Right cervical: No superficial,  deep or posterior cervical adenopathy.    Left cervical: No superficial, deep or posterior cervical adenopathy.     Upper Body:     Right upper body: No supraclavicular or axillary adenopathy.     Left upper body: No supraclavicular or axillary adenopathy.     Lower Body: No right inguinal adenopathy. No left inguinal adenopathy.  Skin:    General: Skin is warm.     Capillary Refill: Capillary refill takes less than 2 seconds.     Findings: No rash.  Neurological:     Mental Status: She is alert and oriented to person, place, and time.     Cranial Nerves: Cranial nerves are intact. No cranial nerve deficit.     Sensory: Sensation is intact.     Motor: Motor function is intact.     Deep Tendon Reflexes: Reflexes normal.  Psychiatric:        Mood and Affect: Mood is not anxious or depressed.     Wt Readings from Last 3 Encounters:  03/03/19 208 lb (94.3 kg)  01/31/19 210 lb 1.6 oz (95.3 kg)  01/21/19 207 lb (93.9 kg)    BP 130/70   Pulse 64   Ht 5\' 9"  (1.753 m)   Wt 208 lb (94.3 kg)   BMI 30.72 kg/m   Assessment and Plan:  1. Annual physical exam No subjective/objective concerns noted today history and physical.  Patient's previous encounters were reviewed as well is labs and imaging and specialty notes.DEBBORA ANG is a 63 y.o. female who presents today for her Complete Annual Exam. She feels well. She reports exercising . She reports she is sleeping  well. Immunizations are reviewed and recommendations provided.   Age appropriate screening tests are discussed. Counseling given for risk factor reduction interventions. - Cytology - PAP  2. Encounter for routine gynecological examination with Papanicolaou smear of cervix Patient said that she had a "partial hysterectomy".  She was uncertain whether she had a right or left ovary that remained as well as she has had a hysterectomy.  On exam there was a cervix that was noted so we Pap the cervix and endocervical.  And this was sent  off for evaluation. - Cytology - PAP  3. Hypothyroidism, unspecified type Chronic.  Controlled.  Stable.  Will continue levothyroxine 75 mcg daily.  Will check a TSH. - levothyroxine (EUTHYROX) 75 MCG tablet; Take 1 tablet (75 mcg total) by mouth daily.  Dispense: 90 tablet; Refill: 1 - TSH  4. Anxiety Chronic.  Controlled.  Patient's been under a lot of anxiety given her recent cancer diagnosis.  Will continue buspirone 5 mg daily as well as refilling her Xanax 0.25 to be used at bedtime as needed for anxiety.  Gad score was noted to be 4 - ALPRAZolam (XANAX) 0.25 MG tablet; Take 1 tablet (0.25 mg total) by mouth at bedtime as needed. for anxiety  Dispense: 30 tablet; Refill: 5 - busPIRone (BUSPAR) 5 MG tablet; Take 1 tablet (5 mg total) by mouth daily.  Dispense: 90 tablet; Refill: 1  5. Chronic obstructive pulmonary disease, unspecified COPD type (HCC) Chronic.  Controlled.  Continue current therapy of Ventolin and Anoro.  6. COPD exacerbation (HCC) Continue Ventolin and Anoro - VENTOLIN HFA 108 (90 Base) MCG/ACT inhaler; INHALE 2 PUFFS BY MOUTH EVERY 6 HOURS AS NEEDED FOR WHEEZING FOR SHORTNESS OF BREATH  Dispense: 18 g; Refill: 11  7. Essential hypertension Chronic.  Controlled.  Continue lisinopril hydrochlorothiazide once a day.  Will check renal function panel - lisinopril-hydrochlorothiazide (ZESTORETIC) 10-12.5 MG tablet; Take 1 tablet by mouth daily.  Dispense: 90 tablet; Refill: 1 - Renal Function Panel  8. Mild episode of recurrent major depressive disorder (HCC) Chronic.  Controlled.  PHQ was noted to be 1.  We will continue Prozac 10 mg once a day. - FLUoxetine (PROZAC) 10 MG capsule; Take 1 capsule (10 mg total) by mouth daily.  Dispense: 90 capsule; Refill: 1  9. Mixed hyperlipidemia Chronic.  Controlled.  Continue atorvastatin 10 mg once a day.  Will check lipid panel - atorvastatin (LIPITOR) 10 MG tablet; Take 1 tablet (10 mg total) by mouth daily.  Dispense: 90  tablet; Refill: 1 - Lipid panel  10. PVD (peripheral vascular disease) (HCC) Chronic.  Controlled.  Will continue on Plavix as well as aspirin. - clopidogrel (PLAVIX) 75 MG tablet; Take 1 tablet (75 mg total) by mouth daily.  Dispense: 90 tablet; Refill: 1  11. Vulval candidiasis Patient was noted to have some erythema of the vulvar area which is consistent with candidiasis.  Patient was prescribed nystatin cream to be used on a twice a week basis. - nystatin cream (MYCOSTATIN); Apply 1 application topically 2 (two) times daily.  Dispense: 30 g; Refill: 0  12. Cervical cancer screening Pap and pelvic was done and cytology was ordered. - Cytology - PAP

## 2019-03-04 ENCOUNTER — Other Ambulatory Visit: Payer: Self-pay

## 2019-03-04 LAB — RENAL FUNCTION PANEL
Albumin: 4.4 g/dL (ref 3.8–4.9)
BUN/Creatinine Ratio: 11 — ABNORMAL LOW (ref 12–28)
BUN: 10 mg/dL (ref 8–27)
CO2: 24 mmol/L (ref 20–29)
Calcium: 9.2 mg/dL (ref 8.7–10.3)
Chloride: 102 mmol/L (ref 96–106)
Creatinine, Ser: 0.92 mg/dL (ref 0.57–1.00)
GFR calc Af Amer: 78 mL/min/{1.73_m2} (ref >59)
GFR calc non Af Amer: 68 mL/min/{1.73_m2} (ref >59)
Glucose: 107 mg/dL — ABNORMAL HIGH (ref 65–99)
Phosphorus: 3.7 mg/dL (ref 3.0–4.3)
Potassium: 4 mmol/L (ref 3.5–5.2)
Sodium: 141 mmol/L (ref 134–144)

## 2019-03-04 LAB — LIPID PANEL
Chol/HDL Ratio: 3.5 ratio (ref 0.0–4.4)
Cholesterol, Total: 170 mg/dL (ref 100–199)
HDL: 49 mg/dL (ref >39)
LDL Chol Calc (NIH): 96 mg/dL (ref 0–99)
Triglycerides: 143 mg/dL (ref 0–149)
VLDL Cholesterol Cal: 25 mg/dL (ref 5–40)

## 2019-03-04 LAB — TSH: TSH: 14.4 u[IU]/mL — ABNORMAL HIGH (ref 0.450–4.500)

## 2019-03-07 ENCOUNTER — Other Ambulatory Visit: Payer: Self-pay

## 2019-03-07 ENCOUNTER — Inpatient Hospital Stay: Payer: BLUE CROSS/BLUE SHIELD | Attending: Hematology and Oncology

## 2019-03-07 VITALS — BP 144/76 | HR 78 | Temp 98.1°F | Resp 18

## 2019-03-07 DIAGNOSIS — Z95828 Presence of other vascular implants and grafts: Secondary | ICD-10-CM

## 2019-03-07 DIAGNOSIS — Z452 Encounter for adjustment and management of vascular access device: Secondary | ICD-10-CM | POA: Diagnosis not present

## 2019-03-07 DIAGNOSIS — E039 Hypothyroidism, unspecified: Secondary | ICD-10-CM

## 2019-03-07 DIAGNOSIS — C3490 Malignant neoplasm of unspecified part of unspecified bronchus or lung: Secondary | ICD-10-CM | POA: Insufficient documentation

## 2019-03-07 MED ORDER — HEPARIN SOD (PORK) LOCK FLUSH 100 UNIT/ML IV SOLN
500.0000 [IU] | Freq: Once | INTRAVENOUS | Status: AC
  Administered 2019-03-07: 500 [IU] via INTRAVENOUS

## 2019-03-07 MED ORDER — SODIUM CHLORIDE 0.9% FLUSH
10.0000 mL | INTRAVENOUS | Status: DC | PRN
  Administered 2019-03-07: 15:00:00 10 mL via INTRAVENOUS
  Filled 2019-03-07: qty 10

## 2019-03-07 MED ORDER — LEVOTHYROXINE SODIUM 88 MCG PO TABS: 88.0000 ug | ORAL_TABLET | Freq: Every day | ORAL | 1 refills | Status: DC

## 2019-03-09 LAB — CYTOLOGY - PAP
Comment: NEGATIVE
Diagnosis: NEGATIVE
High risk HPV: NEGATIVE

## 2019-03-16 MED FILL — TAGRISSO 80 MG TABLET: 80 | 30 days supply | Qty: 30 | Fill #0

## 2019-03-22 ENCOUNTER — Inpatient Hospital Stay: Payer: BLUE CROSS/BLUE SHIELD | Attending: Hematology and Oncology

## 2019-03-22 ENCOUNTER — Other Ambulatory Visit: Payer: Self-pay

## 2019-03-22 DIAGNOSIS — N63 Unspecified lump in unspecified breast: Secondary | ICD-10-CM

## 2019-03-22 DIAGNOSIS — C773 Secondary and unspecified malignant neoplasm of axilla and upper limb lymph nodes: Secondary | ICD-10-CM | POA: Diagnosis present

## 2019-03-22 DIAGNOSIS — C3490 Malignant neoplasm of unspecified part of unspecified bronchus or lung: Secondary | ICD-10-CM | POA: Diagnosis not present

## 2019-03-22 LAB — CBC WITH DIFFERENTIAL/PLATELET
Abs Immature Granulocytes: 0.01 10*3/uL (ref 0.00–0.07)
Basophils Absolute: 0 10*3/uL (ref 0.0–0.1)
Basophils Relative: 1 %
Eosinophils Absolute: 0.1 10*3/uL (ref 0.0–0.5)
Eosinophils Relative: 2 %
HCT: 38.9 % (ref 36.0–46.0)
Hemoglobin: 12.8 g/dL (ref 12.0–15.0)
Immature Granulocytes: 0 %
Lymphocytes Relative: 29 %
Lymphs Abs: 1.6 10*3/uL (ref 0.7–4.0)
MCH: 30.5 pg (ref 26.0–34.0)
MCHC: 32.9 g/dL (ref 30.0–36.0)
MCV: 92.8 fL (ref 80.0–100.0)
Monocytes Absolute: 0.5 10*3/uL (ref 0.1–1.0)
Monocytes Relative: 9 %
Neutro Abs: 3.2 10*3/uL (ref 1.7–7.7)
Neutrophils Relative %: 59 %
Platelets: 187 10*3/uL (ref 150–400)
RBC: 4.19 MIL/uL (ref 3.87–5.11)
RDW: 13.2 % (ref 11.5–15.5)
WBC: 5.4 10*3/uL (ref 4.0–10.5)
nRBC: 0 % (ref 0.0–0.2)

## 2019-03-22 LAB — COMPREHENSIVE METABOLIC PANEL
ALT: 20 U/L (ref 0–44)
AST: 25 U/L (ref 15–41)
Albumin: 3.7 g/dL (ref 3.5–5.0)
Alkaline Phosphatase: 73 U/L (ref 38–126)
Anion gap: 7 (ref 5–15)
BUN: 14 mg/dL (ref 6–20)
CO2: 26 mmol/L (ref 22–32)
Calcium: 8.6 mg/dL — ABNORMAL LOW (ref 8.9–10.3)
Chloride: 103 mmol/L (ref 98–111)
Creatinine, Ser: 0.94 mg/dL (ref 0.44–1.00)
GFR calc Af Amer: >60 mL/min (ref >60)
GFR calc non Af Amer: >60 mL/min (ref >60)
Glucose, Bld: 87 mg/dL (ref 70–99)
Potassium: 3.7 mmol/L (ref 3.5–5.1)
Sodium: 136 mmol/L (ref 135–145)
Total Bilirubin: 0.4 mg/dL (ref 0.3–1.2)
Total Protein: 7.1 g/dL (ref 6.5–8.1)

## 2019-03-22 LAB — MAGNESIUM: Magnesium: 2.3 mg/dL (ref 1.7–2.4)

## 2019-03-22 NOTE — Progress Notes (Signed)
[facility]  [address] [place], [place] [address] Phone: [phone]  Fax: [phone]   Telemedicine Office Visit:  03/24/2019  Referring physician: Juline Patch, MD  I connected with [name] on 03/24/2019 at 9:45 AM by videoconferencing and verified that I was speaking with the correct person using 2 identifiers.  The patient was at home.  I discussed the limitations, risk, security and privacy concerns of performing an evaluation and management service by videoconferencing and the availability of in person appointments.  I also discussed with the patient that there may be a patient responsible charge related to this service.  The patient expressed understanding and agreed to proceed.   Chief Complaint: Kristina Wright is a 63 y.o. female stage IV adenocarcinoma of the lung on osimertinib who is seen for 4 month assessment.   HPI: The patient was last seen in the medical oncology clinic on 11/29/2018. At that time, she denied any respiratory symptoms.  Exam revealed left sided conjunctivitis. She was tolerating osimertinib well. Chest CT revealed an enlarging 1.5 cm left lower lobe pulmonary nodule. Other disease was stable to improved.  We discussed possible biopsy or SBRT to the isolated lesion.  Tumor board recommended a biopsy on 12/09/2018. The patient was referred to pulmonary for bronchoscopy with biopsy.   She was seen by Dr. Patsey Berthold on 12/16/2018. She stated that since starting the Anoro she had not used her rescue inhaler hardly at all. Her dyspnea had been significantly improved. She denied any other constitutional symptoms. Dr. Patsey Berthold wanted to proceed with obtaining a PET/CT to try to delineate the extent of disease and the FDG avidity of this lesion. Patient continue Anoro and albuterol as needed.  She was seen by Dr. Patsey Berthold on 01/03/2019.  PET scan on 12/29/2018 showed that the LEFT lower lobe superior segment nodule had a SUV  max of 8.0 and was highly suspicious for carcinoma.  The remainder of her adenocarcinoma appeared to be responding well as there were no other enhancing lesions and/or previously noted nodules have regressed.  The LEFT lower lobe superior segment nodule was highly suspicious for a metachronous primary bronchogenic malignancy. The patient was apprehensive about this and wanted like to explore potential SBRT without biopsy.  She was not a candidate for surgery. She remained on Anoro and albuterol as needed.  She was evaluated by Dr. Baruch Gouty on 01/31/2019. Patient refused a biopsy. She had no cough, chest tightness, or shortness of breath. Dr. Baruch Gouty was in favor of SBRT to the left lower lobe lesion. He planned on delivering 6,000 cGy in 5 fractions.   She received SBRT from 02/03/2019 - 02/28/2019.   Labs on 03/22/2019: Hematocrit 38.9, hemoglobin 12.8, platelets 187,000, WBC 5,400.  Calcium was 8.6 (corr xx).  During the interim, she has done well. Her weight is stable. Her eye redness and discharge have resolved secondary to eye drops. She does not need to use her inhaler as much anymore. Thyroid medication was increased.   She notes tolerating the SBRT well. She reports taking Tagrisso. She will have a follow up with Dr. Baruch Gouty in 04/04/2019.   Patient notes that with her new insurance for 2021,  she is unsure if she can see her regular doctors anymore. She will no longer be able to visit Dr. Ronnald Ramp. The patient prefers to have a chest CT before the end of the year. She would like to have a colonoscopy by the end of the year.  Past Medical History:  Diagnosis Date  . Cancer ([place]) 1989   cervical  . Depression   . Family history of breast cancer   . Family history of colon cancer   . Family history of ovarian cancer   . Family history of stomach cancer   . Family history of throat cancer   . Hyperlipidemia   . Hypertension   . Malignant neoplasm of lung ([place]) 03/01/2018  . Thyroid  disease     Past Surgical History:  Procedure Laterality Date  . BILATERAL CARPAL TUNNEL RELEASE    . BREAST CYST ASPIRATION Left   . LUNG BIOPSY    . PARTIAL HYSTERECTOMY    . PORTA CATH INSERTION N/A 03/03/2018   Procedure: PORTA CATH INSERTION;  Surgeon: Katha Cabal, MD;  Location: [facility];  Service: Cardiovascular;  Laterality: N/A;  . TUBAL LIGATION      Family History  Problem Relation Age of Onset  . Colon cancer Mother 88  . Colon cancer Maternal Grandmother 78  . Hypertension Father   . Breast cancer Sister 82  . Throat cancer Brother   . Heart attack Maternal Aunt   . Ovarian cancer Maternal Aunt 70  . Stomach cancer Paternal Grandmother        dx >50  . Throat cancer Brother     Social History:  reports that she quit smoking about 16 months ago. Her smoking use included cigarettes. She has a 25.00 pack-year smoking history. She has never used smokeless tobacco. She reports current drug use. Drug: Marijuana. She reports that she does not drink alcohol. 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 The patient is alone today.  Participants in the patient's visit and their role in the encounter included the patient and Waymon Budge, RN, today.  The intake visit was provided by Waymon Budge, RN.  Allergies: No Known Allergies  Current Medications: Current Outpatient Medications  Medication Sig Dispense Refill  . ALPRAZolam (XANAX) 0.25 MG tablet Take 1 tablet (0.25 mg total) by mouth at bedtime as needed. for anxiety 30 tablet 5  . aspirin EC 81 MG tablet Take 81 mg by mouth daily.    Marland Kitchen atorvastatin (LIPITOR) 10 MG tablet Take 1 tablet (10 mg total) by mouth daily. 90 tablet 1  . busPIRone (BUSPAR) 5 MG tablet Take 1 tablet (5 mg total) by mouth daily. 90  tablet 1  . clopidogrel (PLAVIX) 75 MG tablet Take 1 tablet (75 mg total) by mouth daily. 90 tablet 1  . FLUoxetine (PROZAC) 10 MG capsule Take 1 capsule (10 mg total) by mouth daily. 90 capsule 1  . hydrOXYzine (ATARAX/VISTARIL) 10 MG tablet Take 1 tablet (10 mg total) by mouth 3 (three) times daily as needed. 30 tablet 2  . levothyroxine (SYNTHROID) 88 MCG tablet Take 1 tablet (88 mcg total) by mouth daily. 30 tablet 1  . lidocaine-prilocaine (EMLA) cream Apply to affected area once 30 g 3  . lisinopril-hydrochlorothiazide (ZESTORETIC) 10-12.5 MG tablet Take 1 tablet by mouth daily. 90 tablet 1  . Multiple Vitamins-Minerals (MULTIVITAMIN ADULT PO) Take 1 tablet by mouth daily.     Marland Kitchen nystatin cream (MYCOSTATIN) Apply 1 application topically 2 (two) times daily. 30 g 0  . olopatadine (PATANOL) 0.1 % ophthalmic solution Place 1 drop into both eyes 2 (two) times daily. 5 mL 12  . osimertinib mesylate (  TAGRISSO) 80 MG tablet TAKE 1 TABLET (80 MG TOTAL) BY MOUTH DAILY. 30 tablet 2  . triamcinolone cream (KENALOG) 0.1 % Apply 1 application topically 2 (two) times daily. 30 g 1  . umeclidinium-vilanterol (ANORO ELLIPTA) 62.5-25 MCG/INH AEPB Inhale 1 puff into the lungs daily. 60 each 5  . meclizine (ANTIVERT) 25 MG tablet Take 1 tablet (25 mg total) by mouth 3 (three) times daily as needed for dizziness. (Patient not taking: Reported on 03/03/2019) 30 tablet 0  . ondansetron (ZOFRAN) 8 MG tablet Take 1 tablet (8 mg total) by mouth 2 (two) times daily as needed (Nausea or vomiting). Start if needed on the third day after chemotherapy. (Patient not taking: Reported on 03/03/2019) 30 tablet 1  . prochlorperazine (COMPAZINE) 10 MG tablet Take 1 tablet (10 mg total) by mouth every 6 (six) hours as needed (Nausea or vomiting). (Patient not taking: Reported on 03/03/2019) 30 tablet 1   No current facility-administered medications for this visit.      Review of Systems  Constitutional: Negative.  Negative  for chills, diaphoresis, fever, malaise/fatigue and weight loss (stable).       Doing well.  HENT: Negative.  Negative for congestion, ear pain, hearing loss, nosebleeds, sinus pain and sore throat.   Eyes: Negative.  Negative for blurred vision, double vision, photophobia, discharge and redness.  Respiratory: Negative.  Negative for cough, sputum production, shortness of breath (uses inhaler) and wheezing.   Cardiovascular: Negative.  Negative for chest pain, palpitations, leg swelling and PND.  Gastrointestinal: Negative.  Negative for abdominal pain, blood in stool, constipation, diarrhea, heartburn, melena, nausea and vomiting.  Genitourinary: Negative.  Negative for dysuria, frequency, hematuria and urgency.  Musculoskeletal: Negative.  Negative for back pain, joint pain and myalgias.  Skin: Negative.  Negative for itching and rash.  Neurological: Negative.  Negative for dizziness, tingling, sensory change, focal weakness, weakness and headaches.  Endo/Heme/Allergies: Does not bruise/bleed easily.       Thyroid disease on Synthroid.  Psychiatric/Behavioral: Negative.  Negative for depression, memory loss and substance abuse. The patient is not nervous/anxious (takes Xanax) and does not have insomnia.   All other systems reviewed and are negative.   Performance status (ECOG): 0  Physical Exam  Constitutional: She is oriented to person, place, and time. She appears well-developed. No distress.  HENT:  Head: Normocephalic and atraumatic.  Dark hair.  Eyes: Conjunctivae and EOM are normal. No scleral icterus.  Glasses.  Neurological: She is alert and oriented to person, place, and time.  Skin: She is not diaphoretic.  Psychiatric: She has a normal mood and affect. Her behavior is normal. Judgment and thought content normal.  Nursing note reviewed.   Imaging studies: 01/19/2018:  Chest CTrevealed a 8.7 x 5.8 mm slightly irregular pulmonary nodule within the anterior left lower lobe.  There are no other concerning pulmonary nodules. There was a focal 3.1 mm x 9.2 mm pleural nodule at the right middle lobe minor fissure felt likely a benign linear scar or lymphoid tissue of the pleura. There was mediastinal adenopathy including a 1.7 x 3.7 cm precarinal lymph node mass and a 1.7 cm right hilar lymph node. 02/09/2018:  PET scanrevealed an unusual pattern of nodal metastasis with intensely hypermetabolic enlarged precarinal lymph node, RIGHT hilar lymph node and RIGHT axillary lymph node. Recommend biopsy of the RIGHT axillarylymph node. There was no metabolic activity associated with the RIGHT lobe pulmonary nodule. There was no additional evidence primary or metastatic carcinoma. Clinical stageT1N3M1.  06/17/2018:  Chest CT revealed marked interval reduction of mediastinal and right hilar lymphadenopathy.There was nonew lymphadenopathy in the chest. There was slight increase intheleft lower lobe pulmonary nodule (8 mm to10 mm).This was not hypermetabolic on the previous PET-CT. There was interval development of a 7 mm nodule in the medial right upper lobein a background of small airway impaction and subtle tree-in-bud nodularity. 08/24/2018:  Chest CT revealed stable 10 mm left lower lobe pulmonary nodule. Tree-in-bud nodularity seen medial right upper lobe on the previous studywas likely improved, butwasobscured by breathing motion. There was a stable14 mm left breast nodule. 11/24/2018:  Chest CT revealed an enlarging left lower lobe pulmonary nodule measuring 1.5 x 1.4 cm, concerning for progressive malignancy. There was mild diffuse bronchial wall thickening with mild centrilobular and paraseptal emphysema; imaging findings suggestive of underlying COPD. 12/29/2018:  PET scan revealed the LEFT lower lobe superior segment nodule had a SUV max of 8.0 and was highly suspicious for carcinoma.  The remainder of her adenocarcinoma appeared to be responding well as there  were no other enhancing lesions and/or previously noted nodules have regressed.  The LEFT lower lobe superior segment nodule was highly suspicious for a metachronous primary bronchogenic malignancy.  03/10/2018:  Head MRIrevealed no evidence of metastatic disease. 11/01/2018:  Diagnostic left mammogram revealed a benign left breast mass unchanged mammographically since at least 2016. There were no suspicious findings in the left breast.   Appointment on 03/22/2019  Component Date Value Ref Range Status  . WBC 03/22/2019 5.4  4.0 - 10.5 K/uL Final  . RBC 03/22/2019 4.19  3.87 - 5.11 MIL/uL Final  . Hemoglobin 03/22/2019 12.8  12.0 - 15.0 g/dL Final  . HCT 03/22/2019 38.9  36.0 - 46.0 % Final  . MCV 03/22/2019 92.8  80.0 - 100.0 fL Final  . MCH 03/22/2019 30.5  26.0 - 34.0 pg Final  . MCHC 03/22/2019 32.9  30.0 - 36.0 g/dL Final  . RDW 03/22/2019 13.2  11.5 - 15.5 % Final  . Platelets 03/22/2019 187  150 - 400 K/uL Final  . nRBC 03/22/2019 0.0  0.0 - 0.2 % Final  . Neutrophils Relative % 03/22/2019 59  % Final  . Neutro Abs 03/22/2019 3.2  1.7 - 7.7 K/uL Final  . Lymphocytes Relative 03/22/2019 29  % Final  . Lymphs Abs 03/22/2019 1.6  0.7 - 4.0 K/uL Final  . Monocytes Relative 03/22/2019 9  % Final  . Monocytes Absolute 03/22/2019 0.5  0.1 - 1.0 K/uL Final  . Eosinophils Relative 03/22/2019 2  % Final  . Eosinophils Absolute 03/22/2019 0.1  0.0 - 0.5 K/uL Final  . Basophils Relative 03/22/2019 1  % Final  . Basophils Absolute 03/22/2019 0.0  0.0 - 0.1 K/uL Final  . Immature Granulocytes 03/22/2019 0  % Final  . Abs Immature Granulocytes 03/22/2019 0.01  0.00 - 0.07 K/uL Final   Performed at [facility], [address], [place], [place] [address]  . Sodium 03/22/2019 136  135 - 145 mmol/L Final  . Potassium 03/22/2019 3.7  3.5 - 5.1 mmol/L Final  . Chloride 03/22/2019 103  98 - 111 mmol/L Final  . CO2 03/22/2019 26  22 - 32 mmol/L Final  . Glucose, Bld 03/22/2019 87   70 - 99 mg/dL Final  . BUN 03/22/2019 14  6 - 20 mg/dL Final  . Creatinine, Ser 03/22/2019 0.94  0.44 - 1.00 mg/dL Final  . Calcium 03/22/2019 8.6* 8.9 - 10.3 mg/dL Final  .  Total Protein 03/22/2019 7.1  6.5 - 8.1 g/dL Final  . Albumin 03/22/2019 3.7  3.5 - 5.0 g/dL Final  . AST 03/22/2019 25  15 - 41 U/L Final  . ALT 03/22/2019 20  0 - 44 U/L Final  . Alkaline Phosphatase 03/22/2019 73  38 - 126 U/L Final  . Total Bilirubin 03/22/2019 0.4  0.3 - 1.2 mg/dL Final  . GFR calc non Af Amer 03/22/2019 >60  >60 mL/min Final  . GFR calc Af Amer 03/22/2019 >60  >60 mL/min Final  . Anion gap 03/22/2019 7  5 - 15 Final   Performed at [facility], [address] [place], [place] [address]  . Magnesium 03/22/2019 2.3  1.7 - 2.4 mg/dL Final   Performed at [facility], [address] [place], [place] [address]    Assessment:  Kristina Wright is a 63 y.o. female with stage IV adenocarcinoma of the lungs/p right axillary biopsy on 02/24/2018. Pathologyrevealed metastatic adenocarcinoma c/w lung primary. Tumor cells were positive for CK7 and TTF-1 with minimal blush reactivity for CK20. Tumor cells were negative for GATA3, ER, and PR. Omniseq testingrevealed EGFR c.2582>A (L861Q) mutation and PDL-1 60% TPS. CEAwas 2.9 on 05/17/2018.  PET scanon 02/09/2018 revealed an unusual pattern of nodal metastasis with intensely hypermetabolic enlarged precarinal lymph node, RIGHT hilar lymph node and RIGHT axillary lymph node. Recommend biopsy of the RIGHT axillarylymph node. There was no metabolic activity associated with the RIGHT lobe pulmonary nodule. There was no additional evidence primary or metastatic carcinoma. Clinical stageT1N3M1.  Chest CT on 11/24/2018 revealed an enlarging left lower lobe pulmonary nodule measuring 1.5 x 1.4 cm, concerning for progressive malignancy. There was mild diffuse bronchial wall thickening with mild centrilobular and paraseptal  emphysema; imaging findings suggestive of underlying COPD.  PET scan on 12/29/2018 revealed an isolated 1.2 cm hypermetabolic LLL pulmonary nodule (SUV 8.0).  There was no evidence of metastasis.  She refused a biopsy.   She received SBRT (6,000 cGy in 5 fractions) to the left lower lobe nodule from 02/03/2019 - 02/28/2019.   Head MRIon 03/10/2018 revealed no evidence of metastatic disease.  She began osimertinibon 03/25/2018.[place] 07/27/2018 showed an ejection fraction in the range of 60-65%. She is tolerating treatment well.  Diagnostic left mammogram on 11/01/2018 revealed a benign left breast mass unchanged mammographically since at least 2016. There were no suspicious findings in the left breast.  She has a history of cervical cancerin 1989. She underwent cryotherapy.  Symptomatically, she is doing well.  She tolerated SBRT.  Plan: 1.   Review labs from 03/22/2019. 2.   Stage IV adenocarcinoma of the Lung (EGFR L861Q mutation) Clinically, she continues to do well.               She is tolerating osimertinib.             PET scan on 12/29/2018 revealed an isolated 1.2 cm LLL pulmonary nodule.    She received SBRT (completed 02/28/2019).  Discuss ongoing surveillance.   Schedule imaging studies prior to end of year per patient request. 3.Port-a-cath Patient currently has port-a-cath in place.  Continue port-a-cath flushes every 12 weeks during COVID-19 pandemic.  4.   Schedule chest CT on 04/21/2019. 5.   RTC after chest CT for MD assessment and review of imaging.  I discussed the assessment and treatment plan with the patient.  The patient was provided an opportunity to ask questions and all were answered.  The  patient agreed with the plan and demonstrated an understanding of the instructions.  The patient was advised to call back or seek an in person evaluation if the symptoms worsen or if the condition fails to improve as anticipated.  I  provided 22 minutes (9:45 AM - 10:06 AM) of face-to-face video visit time during this this encounter and > 50% was spent counseling as documented under my assessment and plan.  I provided these services from the [facility] office.   Nolon Stalls, MD, PhD  03/24/2019, 9:45 AM  I, Selena Batten, am acting as scribe for [employer]. Mike Gip, MD, PhD.  I, Melissa C. Mike Gip, MD, have reviewed the above documentation for accuracy and completeness, and I agree with the above.

## 2019-03-23 NOTE — Progress Notes (Signed)
Confirmed Name, DOB, and Address. Patient would like to know if she should have her port removed.

## 2019-03-24 ENCOUNTER — Inpatient Hospital Stay (HOSPITAL_BASED_OUTPATIENT_CLINIC_OR_DEPARTMENT_OTHER): Payer: BLUE CROSS/BLUE SHIELD | Admitting: Hematology and Oncology

## 2019-03-24 ENCOUNTER — Encounter: Payer: Self-pay | Admitting: Hematology and Oncology

## 2019-03-24 DIAGNOSIS — C3490 Malignant neoplasm of unspecified part of unspecified bronchus or lung: Secondary | ICD-10-CM

## 2019-03-24 DIAGNOSIS — R911 Solitary pulmonary nodule: Secondary | ICD-10-CM

## 2019-03-24 DIAGNOSIS — Z7189 Other specified counseling: Secondary | ICD-10-CM | POA: Diagnosis not present

## 2019-03-24 DIAGNOSIS — N63 Unspecified lump in unspecified breast: Secondary | ICD-10-CM

## 2019-04-01 ENCOUNTER — Other Ambulatory Visit: Payer: Self-pay

## 2019-04-04 ENCOUNTER — Other Ambulatory Visit: Payer: Self-pay

## 2019-04-04 ENCOUNTER — Encounter: Payer: Self-pay | Admitting: Radiation Oncology

## 2019-04-04 ENCOUNTER — Ambulatory Visit
Admission: RE | Admit: 2019-04-04 | Discharge: 2019-04-04 | Disposition: A | Discharge status: Home / Self Care | Payer: BLUE CROSS/BLUE SHIELD | Source: Ambulatory Visit | Attending: Radiation Oncology | Admitting: Radiation Oncology

## 2019-04-04 ENCOUNTER — Telehealth: Payer: Self-pay | Admitting: Pharmacy Technician

## 2019-04-04 VITALS — BP 165/90 | HR 83 | Temp 97.6°F | Resp 16 | Wt 214.2 lb

## 2019-04-04 DIAGNOSIS — Z923 Personal history of irradiation: Secondary | ICD-10-CM | POA: Diagnosis not present

## 2019-04-04 DIAGNOSIS — R911 Solitary pulmonary nodule: Secondary | ICD-10-CM

## 2019-04-04 DIAGNOSIS — C3432 Malignant neoplasm of lower lobe, left bronchus or lung: Secondary | ICD-10-CM | POA: Insufficient documentation

## 2019-04-04 NOTE — Telephone Encounter (Signed)
Oral Oncology Patient Advocate Encounter  Prior Authorization for [facility] has been approved.    PA# [id number] Effective dates: 04/04/2019 through 04/02/2020  Oral Oncology Clinic will continue to follow.   [place] Patient [place] Phone [phone] Fax [phone] 04/04/2019 8:42 AM

## 2019-04-04 NOTE — Progress Notes (Signed)
Radiation Oncology Follow up Note  Name: Kristina Wright   Date:   04/04/2019 MRN:  [id number] DOB: June 20, 1955    This 63 y.o. female presents to the clinic today for 1 month follow-up status post SBRT to left lower lobe in a patient with known stage IV adenocarcinoma of the lung currently on immunotherapy.  REFERRING PROVIDER: Juline Patch, MD  HPI: Patient is a 63 year old female now at 1 month having completed SBRT to her left lower lobe for a hypermetabolic pulmonary nodule in patient with known stage IV adenocarcinoma of the lung.  She is seen today in routine follow-up is doing well she specifically denies cough hemoptysis or chest tightness..  She is already had a CT scan of the chest scheduled for next month which I will review when it becomes available.  COMPLICATIONS OF TREATMENT: none  FOLLOW UP COMPLIANCE: keeps appointments   PHYSICAL EXAM:  BP (!) 165/90   Pulse 83   Temp 97.6 F (36.4 C)   Resp 16   Wt 214 lb 3.2 oz (97.2 kg)   SpO2 98%   BMI 31.63 kg/m  Well-developed well-nourished patient in NAD. HEENT reveals PERLA, EOMI, discs not visualized.  Oral cavity is clear. No oral mucosal lesions are identified. Neck is clear without evidence of cervical or supraclavicular adenopathy. Lungs are clear to A&P. Cardiac examination is essentially unremarkable with regular rate and rhythm without murmur rub or thrill. Abdomen is benign with no organomegaly or masses noted. Motor sensory and DTR levels are equal and symmetric in the upper and lower extremities. Cranial nerves II through XII are grossly intact. Proprioception is intact. No peripheral adenopathy or edema is identified. No motor or sensory levels are noted. Crude visual fields are within normal range.  RADIOLOGY RESULTS: No current films for review  PLAN: Present time patient is doing well very low side effect profile from her SBRT.  I am pleased with her overall progress.  She continues close follow-up care with  medical oncology.  Dr. Mike Gip is already ordered a CT scan next month.  Which I will review when it becomes available.  I have asked to see her back in 3 months for follow-up.  Patient knows to call sooner with any concerns.  I would like to take this opportunity to thank you for allowing me to participate in the care of your patient.Noreene Filbert, MD

## 2019-04-04 NOTE — Telephone Encounter (Signed)
Oral Oncology Patient Advocate Encounter  Received notification from [facility] that prior authorization for Tagrisso is required.  PA submitted on CoverMyMeds Key [id number] Status is pending  Oral Oncology Clinic will continue to follow.  [place] Patient [place] Phone [phone] Fax [phone] 04/04/2019 8:41 AM

## 2019-04-12 MED FILL — TAGRISSO 80 MG TABLET: 80 | 30 days supply | Qty: 30 | Fill #1

## 2019-04-20 ENCOUNTER — Other Ambulatory Visit: Payer: Self-pay | Admitting: Family Medicine

## 2019-04-20 ENCOUNTER — Other Ambulatory Visit: Payer: Self-pay

## 2019-04-20 ENCOUNTER — Ambulatory Visit
Admission: RE | Admit: 2019-04-20 | Discharge: 2019-04-20 | Disposition: A | Discharge status: Home / Self Care | Payer: BLUE CROSS/BLUE SHIELD | Source: Ambulatory Visit | Attending: Hematology and Oncology | Admitting: Hematology and Oncology

## 2019-04-20 DIAGNOSIS — R911 Solitary pulmonary nodule: Secondary | ICD-10-CM

## 2019-04-20 DIAGNOSIS — C3490 Malignant neoplasm of unspecified part of unspecified bronchus or lung: Secondary | ICD-10-CM | POA: Diagnosis not present

## 2019-04-20 DIAGNOSIS — L280 Lichen simplex chronicus: Secondary | ICD-10-CM

## 2019-04-21 ENCOUNTER — Ambulatory Visit: Payer: BLUE CROSS/BLUE SHIELD | Admitting: Pulmonary Disease

## 2019-04-23 NOTE — Progress Notes (Signed)
[facility]  [address], [address] [place], [place] [address] Phone: [phone]  Fax: [phone]   Telemedicine Office Visit:  04/25/2019  Referring physician: Juline Patch, MD  I connected with Kristina Wright on 04/25/19 at 11:44 AM by videoconferencing and verified that I was speaking with the correct person using 2 identifiers.  The patient was at home.  I discussed the limitations, risk, security and privacy concerns of performing an evaluation and management service by videoconferencing and the availability of in person appointments.  I also discussed with the patient that there may be a patient responsible charge related to this service.  The patient expressed understanding and agreed to proceed.   Chief Complaint: Kristina Wright is a 64 y.o. female with stage IV adenocarcinoma of the lung on osimertinib who is seen for assessment 1 month assessment after interval chest CT.   HPI: The patient was last seen in the medical oncology clinic via telemedicine on 03/24/2019. At that time, she was doing well.  She tolerated SBRT to an isolated 1.2 cm LLL pulmonary nodule well.  Chest CT wo contrast on 04/20/2019 showed an interval reduction in volume of the left lower lobe nodule following stereotactic body radiation treatment. There was no new or progressive malignancy.  During the interim, she has been "just fine, really nice so far."  Symptomatically she feels good. She had no side effects from radiation.  She denies any shortness of breath.  She hasn't been coughing.  She hasn't had to use her inhaler. She would like to lose a few pounds.  She continues to work as Sales executive; she wears a mask when she goes to work.  She needs blood work before she gets her Synthroid refilled.  She is currently taking Synthroid 88 mcg/day.   TSH was 14.40 (high) on 03/03/2019.  She takes her Xanax occasionally.  She had her port flushed 03/07/2019.   Past Medical  History:  Diagnosis Date  . Cancer ([place]) 1989   cervical  . Depression   . Family history of breast cancer   . Family history of colon cancer   . Family history of ovarian cancer   . Family history of stomach cancer   . Family history of throat cancer   . Hyperlipidemia   . Hypertension   . Malignant neoplasm of lung ([place]) 03/01/2018  . Thyroid disease     Past Surgical History:  Procedure Laterality Date  . BILATERAL CARPAL TUNNEL RELEASE    . BREAST CYST ASPIRATION Left   . LUNG BIOPSY    . PARTIAL HYSTERECTOMY    . PORTA CATH INSERTION N/A 03/03/2018   Procedure: PORTA CATH INSERTION;  Surgeon: Katha Cabal, MD;  Location: [facility];  Service: Cardiovascular;  Laterality: N/A;  . TUBAL LIGATION      Family History  Problem Relation Age of Onset  . Colon cancer Mother 1  . Colon cancer Maternal Grandmother 78  . Hypertension Father   . Breast cancer Sister 81  . Throat cancer Brother   . Heart attack Maternal Aunt   . Ovarian cancer Maternal Aunt 70  . Stomach cancer Paternal Grandmother        dx >50  . Throat cancer Brother     Social History:  reports that she quit smoking about 17 months ago. Her smoking use included cigarettes. She has a 25.00 pack-year smoking history. She has never used smokeless tobacco. She reports current drug  use. Drug: Marijuana. She reports that she does not drink alcohol. 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  The patient is alone today.  Participants in the patient's visit and their role in the encounter included the patient, Samul Dada, scribe, and [employer], CMA, today.  The intake visit was provided by Samul Dada, scribe, and Vito Berger, CMA.   Allergies: No Known Allergies  Current Medications: Current  Outpatient Medications  Medication Sig Dispense Refill  . ALPRAZolam (XANAX) 0.25 MG tablet Take 1 tablet (0.25 mg total) by mouth at bedtime as needed. for anxiety 30 tablet 5  . aspirin EC 81 MG tablet Take 81 mg by mouth daily.    Marland Kitchen atorvastatin (LIPITOR) 10 MG tablet Take 1 tablet (10 mg total) by mouth daily. 90 tablet 1  . busPIRone (BUSPAR) 5 MG tablet Take 1 tablet (5 mg total) by mouth daily. 90 tablet 1  . clopidogrel (PLAVIX) 75 MG tablet Take 1 tablet (75 mg total) by mouth daily. 90 tablet 1  . FLUoxetine (PROZAC) 10 MG capsule Take 1 capsule (10 mg total) by mouth daily. 90 capsule 1  . hydrOXYzine (ATARAX/VISTARIL) 10 MG tablet Take 1 tablet by mouth three times daily as needed 30 tablet 1  . levothyroxine (SYNTHROID) 88 MCG tablet Take 1 tablet (88 mcg total) by mouth daily. 30 tablet 1  . lisinopril-hydrochlorothiazide (ZESTORETIC) 10-12.5 MG tablet Take 1 tablet by mouth daily. 90 tablet 1  . Multiple Vitamins-Minerals (MULTIVITAMIN ADULT PO) Take 1 tablet by mouth daily.     Marland Kitchen nystatin cream (MYCOSTATIN) Apply 1 application topically 2 (two) times daily. 30 g 0  . olopatadine (PATANOL) 0.1 % ophthalmic solution Place 1 drop into both eyes 2 (two) times daily. 5 mL 12  . osimertinib mesylate (TAGRISSO) 80 MG tablet TAKE 1 TABLET (80 MG TOTAL) BY MOUTH DAILY. 30 tablet 2  . meclizine (ANTIVERT) 25 MG tablet Take 1 tablet (25 mg total) by mouth 3 (three) times daily as needed for dizziness. (Patient not taking: Reported on 03/03/2019) 30 tablet 0  . triamcinolone cream (KENALOG) 0.1 % Apply 1 application topically 2 (two) times daily. (Patient not taking: Reported on 04/25/2019) 30 g 1  . umeclidinium-vilanterol (ANORO ELLIPTA) 62.5-25 MCG/INH AEPB Inhale 1 puff into the lungs daily. (Patient not taking: Reported on 04/25/2019) 60 each 5  . VENTOLIN HFA 108 (90 Base) MCG/ACT inhaler      No current facility-administered medications for this visit.    Review of Systems    Constitutional: Negative.  Negative for chills, diaphoresis, fever, malaise/fatigue and weight loss (unspecified weght gain).       Feels "good".  HENT: Negative.  Negative for congestion, ear pain, hearing loss, nosebleeds, sinus pain and sore throat.   Eyes: Negative.  Negative for blurred vision, double vision, photophobia, discharge and redness.  Respiratory: Negative.  Negative for cough, sputum production, shortness of breath (uses inhaler) and wheezing.   Cardiovascular: Negative.  Negative for chest pain, palpitations, leg swelling and PND.  Gastrointestinal: Negative.  Negative for abdominal pain, blood in stool, constipation, diarrhea, heartburn, melena, nausea and vomiting.  Genitourinary: Negative.  Negative for dysuria, frequency, hematuria and urgency.  Musculoskeletal: Negative.  Negative for back pain, joint pain and myalgias.  Skin: Negative.  Negative for itching and rash.  Neurological: Negative.  Negative for dizziness, tingling, sensory change, focal weakness, weakness and  headaches.  Endo/Heme/Allergies: Does not bruise/bleed easily.       Thyroid disease on Synthroid.  Psychiatric/Behavioral: Negative.  Negative for depression, memory loss and substance abuse. The patient is not nervous/anxious (takes Xanax occasionally) and does not have insomnia.   All other systems reviewed and are negative.  Performance status (ECOG):  0  Vitals There were no vitals taken for this visit.   Physical Exam  Constitutional: She is oriented to person, place, and time. She appears well-developed and well-nourished. No distress.  HENT:  Head: Normocephalic and atraumatic.  Shoulder length brown hair.  Eyes: Conjunctivae and EOM are normal. No scleral icterus.  Glasses.  Brown eyes.  Neurological: She is oriented to person, place, and time.  Skin: She is not diaphoretic.  Psychiatric: She has a normal mood and affect. Her behavior is normal. Judgment and thought content normal.   Nursing note reviewed.   No visits with results within 3 Day(s) from this visit.  Latest known visit with results is:  Appointment on 03/22/2019  Component Date Value Ref Range Status  . WBC 03/22/2019 5.4  4.0 - 10.5 K/uL Final  . RBC 03/22/2019 4.19  3.87 - 5.11 MIL/uL Final  . Hemoglobin 03/22/2019 12.8  12.0 - 15.0 g/dL Final  . HCT 03/22/2019 38.9  36.0 - 46.0 % Final  . MCV 03/22/2019 92.8  80.0 - 100.0 fL Final  . MCH 03/22/2019 30.5  26.0 - 34.0 pg Final  . MCHC 03/22/2019 32.9  30.0 - 36.0 g/dL Final  . RDW 03/22/2019 13.2  11.5 - 15.5 % Final  . Platelets 03/22/2019 187  150 - 400 K/uL Final  . nRBC 03/22/2019 0.0  0.0 - 0.2 % Final  . Neutrophils Relative % 03/22/2019 59  % Final  . Neutro Abs 03/22/2019 3.2  1.7 - 7.7 K/uL Final  . Lymphocytes Relative 03/22/2019 29  % Final  . Lymphs Abs 03/22/2019 1.6  0.7 - 4.0 K/uL Final  . Monocytes Relative 03/22/2019 9  % Final  . Monocytes Absolute 03/22/2019 0.5  0.1 - 1.0 K/uL Final  . Eosinophils Relative 03/22/2019 2  % Final  . Eosinophils Absolute 03/22/2019 0.1  0.0 - 0.5 K/uL Final  . Basophils Relative 03/22/2019 1  % Final  . Basophils Absolute 03/22/2019 0.0  0.0 - 0.1 K/uL Final  . Immature Granulocytes 03/22/2019 0  % Final  . Abs Immature Granulocytes 03/22/2019 0.01  0.00 - 0.07 K/uL Final   Performed at [facility], [address], [place], [place] [address]  . Sodium 03/22/2019 136  135 - 145 mmol/L Final  . Potassium 03/22/2019 3.7  3.5 - 5.1 mmol/L Final  . Chloride 03/22/2019 103  98 - 111 mmol/L Final  . CO2 03/22/2019 26  22 - 32 mmol/L Final  . Glucose, Bld 03/22/2019 87  70 - 99 mg/dL Final  . BUN 03/22/2019 14  6 - 20 mg/dL Final  . Creatinine, Ser 03/22/2019 0.94  0.44 - 1.00 mg/dL Final  . Calcium 03/22/2019 8.6* 8.9 - 10.3 mg/dL Final  . Total Protein 03/22/2019 7.1  6.5 - 8.1 g/dL Final  . Albumin 03/22/2019 3.7  3.5 - 5.0 g/dL Final  . AST 03/22/2019 25  15 - 41 U/L Final  .  ALT 03/22/2019 20  0 - 44 U/L Final  . Alkaline Phosphatase 03/22/2019 73  38 - 126 U/L Final  . Total Bilirubin 03/22/2019 0.4  0.3 - 1.2 mg/dL Final  . GFR calc  non Af Amer 03/22/2019 >60  >60 mL/min Final  . GFR calc Af Amer 03/22/2019 >60  >60 mL/min Final  . Anion gap 03/22/2019 7  5 - 15 Final   Performed at [facility], [address] [place], [place] [address]  . Magnesium 03/22/2019 2.3  1.7 - 2.4 mg/dL Final   Performed at [facility], [address] [place], [place] [address]    Assessment:  Kristina Wright is a 64 y.o. female with stage IV adenocarcinoma of the lungs/p right axillary biopsy on 02/24/2018. Pathologyrevealed metastatic adenocarcinoma c/w lung primary. Tumor cells were positive for CK7 and TTF-1 with minimal blush reactivity for CK20. Tumor cells were negative for GATA3, ER, and PR. Omniseq testingrevealed EGFR c.2582>A (L861Q) mutation and PDL-1 60% TPS. CEAwas 2.9 on 05/17/2018.  PET scanon 02/09/2018 revealed an unusual pattern of nodal metastasis with intensely hypermetabolic enlarged precarinal lymph node, RIGHT hilar lymph node and RIGHT axillary lymph node. Recommend biopsy of the RIGHT axillarylymph node. There was no metabolic activity associated with the RIGHT lobe pulmonary nodule. There was no additional evidence primary or metastatic carcinoma. Clinical stageT1N3M1.  Chest CTon 11/24/2018 revealed an enlarging left lower lobe pulmonary nodule measuring1.5 x 1.4 cm, concerning for progressive malignancy. There was mild diffuse bronchial wall thickening with mild centrilobular and paraseptal emphysema; imaging findings suggestive of underlying COPD.  PET scan on 12/29/2018 revealed an isolated 1.2 cm hypermetabolic LLL pulmonary nodule (SUV 8.0).  There was no evidence of metastasis.  She refused a biopsy.   She received SBRT (6,000 cGy in 5 fractions) to the left lower lobe nodule from 02/03/2019 - 02/28/2019.    Chest CT on 04/20/2019 showed an interval reduction in volume of the left lower lobe nodule following stereotactic body radiation treatment. There was no new or progressive malignancy.  Head MRIon 03/10/2018 revealed no evidence of metastatic disease.  She began osimertinibon 03/25/2018.[place] 07/27/2018 showed an ejection fraction in the range of 60-65%. She is tolerating treatment well.  Diagnostic left mammogramon 11/01/2018 revealed a benign left breast mass unchanged mammographically since at least 2016. There were no suspicious findings in the left breast.  She has a history of cervical cancerin 1989. She underwent cryotherapy.  Symptomatically, she feels "ok".  She denies any shortness of breath or cough.  Plan: 1.   Review labs from 03/22/2019. 2.   Stage IV adenocarcinoma of the Lung (EGFR L861Q mutation) Clinically, she is doing well. She continues osimertinib 80 mg a day. PET scan on 12/29/2018 revealed an isolated 1.2 cm LLL pulmonary nodule.                          She received SBRT (completed 02/28/2019).             Chest CT on 04/20/2019 personally reviewed.  Agree with radiology.                         Interval reduction in volume of LLL lesion. 3.Elevated TSH  TSH was 14.40 (0.45 - 4.5) on 03/03/2019.  Patient on Synthroid 88 mcg/day.  Review plans for follow-up testing and likely increase in Synthroid. 4.   Port-a-cath Port-a-cath remains in place.             Port-a-cath flushes every 12 weeks during COVID-19 pandemic (last 03/07/2019). 5.   Please mail copy of chest CT to patient.  6.   RTC  on 06/20/2019 for MD assessment, labs (CBC with diff, CMP), and port-a- cath flush.  I discussed the assessment and treatment plan with the patient.  The patient was provided an opportunity to ask questions and all were answered.  The patient agreed with the plan and demonstrated an understanding of the  instructions.  The patient was advised to call back if the symptoms worsen or if the condition fails to improve as anticipated.  I provided 14 minutes (11:42 AM - 11:56 AM) of face-to-face time during this this encounter and > 50% was spent counseling as documented under my assessment and plan.    Lequita Asal, MD, PhD    04/25/2019, 11:56 AM  I, Samul Dada, am acting as a scribe for Lequita Asal, MD.  I, [place] Mike Gip, MD, have reviewed the above documentation for accuracy and completeness, and I agree with the above.

## 2019-04-25 ENCOUNTER — Inpatient Hospital Stay: Payer: BLUE CROSS/BLUE SHIELD | Attending: Hematology and Oncology | Admitting: Hematology and Oncology

## 2019-04-25 ENCOUNTER — Encounter: Payer: Self-pay | Admitting: Hematology and Oncology

## 2019-04-25 DIAGNOSIS — Z7189 Other specified counseling: Secondary | ICD-10-CM

## 2019-04-25 DIAGNOSIS — C7800 Secondary malignant neoplasm of unspecified lung: Secondary | ICD-10-CM

## 2019-04-25 DIAGNOSIS — R7989 Other specified abnormal findings of blood chemistry: Secondary | ICD-10-CM

## 2019-04-25 DIAGNOSIS — R911 Solitary pulmonary nodule: Secondary | ICD-10-CM | POA: Diagnosis not present

## 2019-04-25 DIAGNOSIS — C801 Malignant (primary) neoplasm, unspecified: Secondary | ICD-10-CM

## 2019-04-25 NOTE — Progress Notes (Signed)
No new changes noted today. The patient Name and DOB has been verified by phone today. 

## 2019-05-04 ENCOUNTER — Other Ambulatory Visit: Payer: Self-pay

## 2019-05-04 DIAGNOSIS — E039 Hypothyroidism, unspecified: Secondary | ICD-10-CM

## 2019-05-04 NOTE — Progress Notes (Signed)
Lab entered

## 2019-05-06 ENCOUNTER — Other Ambulatory Visit: Payer: Self-pay

## 2019-05-06 DIAGNOSIS — E039 Hypothyroidism, unspecified: Secondary | ICD-10-CM

## 2019-05-06 LAB — TSH: TSH: 6.74 u[IU]/mL — ABNORMAL HIGH (ref 0.450–4.500)

## 2019-05-06 MED ORDER — LEVOTHYROXINE SODIUM 100 MCG PO TABS: 88.0000 ug | ORAL_TABLET | Freq: Every day | ORAL | 1 refills | Status: DC

## 2019-05-16 MED FILL — TAGRISSO 80 MG TABLET: 80 | 30 days supply | Qty: 30 | Fill #2

## 2019-05-30 ENCOUNTER — Other Ambulatory Visit: Payer: Self-pay

## 2019-05-30 ENCOUNTER — Inpatient Hospital Stay: Payer: BLUE CROSS/BLUE SHIELD | Attending: Hematology and Oncology

## 2019-05-30 DIAGNOSIS — Z452 Encounter for adjustment and management of vascular access device: Secondary | ICD-10-CM | POA: Diagnosis not present

## 2019-05-30 DIAGNOSIS — Z95828 Presence of other vascular implants and grafts: Secondary | ICD-10-CM

## 2019-05-30 DIAGNOSIS — C7800 Secondary malignant neoplasm of unspecified lung: Secondary | ICD-10-CM | POA: Insufficient documentation

## 2019-05-30 DIAGNOSIS — C801 Malignant (primary) neoplasm, unspecified: Secondary | ICD-10-CM | POA: Insufficient documentation

## 2019-05-30 MED ORDER — HEPARIN SOD (PORK) LOCK FLUSH 100 UNIT/ML IV SOLN
500.0000 [IU] | Freq: Once | INTRAVENOUS | Status: AC
  Administered 2019-05-30: 500 [IU] via INTRAVENOUS
  Filled 2019-05-30: qty 5

## 2019-05-30 MED ORDER — SODIUM CHLORIDE 0.9% FLUSH
10.0000 mL | INTRAVENOUS | Status: DC | PRN
  Administered 2019-05-30: 14:00:00 10 mL via INTRAVENOUS
  Filled 2019-05-30: qty 10

## 2019-06-08 ENCOUNTER — Other Ambulatory Visit: Payer: Self-pay | Admitting: Hematology and Oncology

## 2019-06-08 DIAGNOSIS — C7800 Secondary malignant neoplasm of unspecified lung: Secondary | ICD-10-CM

## 2019-06-15 MED FILL — TAGRISSO 80 MG TABLET: 80 | 30 days supply | Qty: 30 | Fill #0

## 2019-06-16 ENCOUNTER — Other Ambulatory Visit: Payer: Self-pay

## 2019-06-16 ENCOUNTER — Other Ambulatory Visit: Payer: BLUE CROSS/BLUE SHIELD

## 2019-06-16 DIAGNOSIS — E039 Hypothyroidism, unspecified: Secondary | ICD-10-CM

## 2019-06-16 NOTE — Progress Notes (Signed)
[facility]  [address] [place], [place] [address] Phone: [phone]  Fax: [phone]   Clinic Day:  06/20/2019  Referring physician: Juline Patch, MD  Chief Complaint: Kristina Wright is a 64 y.o. female with stage IV adenocarcinoma of the lung on osimertinib who is seen for a 2 month assessment.  HPI: The patient was last seen in the medical oncology clinic on 04/25/2019 via telemedicine. At that time, she felt "ok".  She denied any shortness of breath or cough. Hematocrit was 38.9, hemoglobin 12.8, platelets 187,000, WBC 5,400. Calcium was 8.6. Magnesium was 2.3. Patient continued osimertinib 80 mg a day and synthroid 88 mcg per day.   She received the first dose of the COVID-19 vaccine on 06/17/2019. She had no symptoms.   During the interim, the patient has felt good. She will have her second COVID-19 vaccine on 07/12/2019. She will be starting a new dosage 110 mg of Synthroid. She is compliant with her current medications. She feels like she has more energy and strength. Her back pain is improving.   She can not walk a long distance without having to stop and rest secondary to her peripheral vascular disease. She feels her symptoms are worsening.   For weight loss, I suggested My Fitness Pal app. She is active and works out.   She will follow up with Dr.Chrystal on 07/06/2019.    Past Medical History:  Diagnosis Date  . Cancer ([place]) 1989   cervical  . Depression   . Family history of breast cancer   . Family history of colon cancer   . Family history of ovarian cancer   . Family history of stomach cancer   . Family history of throat cancer   . Hyperlipidemia   . Hypertension   . Malignant neoplasm of lung ([place]) 03/01/2018  . Thyroid disease     Past Surgical History:  Procedure Laterality Date  . BILATERAL CARPAL TUNNEL RELEASE    . BREAST CYST ASPIRATION Left   . LUNG BIOPSY    . PARTIAL HYSTERECTOMY    . PORTA CATH  INSERTION N/A 03/03/2018   Procedure: PORTA CATH INSERTION;  Surgeon: Katha Cabal, MD;  Location: [facility] [facility];  Service: Cardiovascular;  Laterality: N/A;  . TUBAL LIGATION      Family History  Problem Relation Age of Onset  . Colon cancer Mother 4  . Colon cancer Maternal Grandmother 78  . Hypertension Father   . Breast cancer Sister 70  . Throat cancer Brother   . Heart attack Maternal Aunt   . Ovarian cancer Maternal Aunt 70  . Stomach cancer Paternal Grandmother        dx >50  . Throat cancer Brother     Social History:  reports that she quit smoking about 19 months ago. Her smoking use included cigarettes. She has a 25.00 pack-year smoking history. She has never used smokeless tobacco. She reports current drug use. Drug: Marijuana. She reports that she does not drink alcohol.  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 She is expecting a grandchild. The patient is alone today.  Allergies: No Known Allergies  Current Medications: Current Outpatient Medications  Medication Sig Dispense Refill  . ALPRAZolam (XANAX) 0.25 MG tablet Take 1 tablet (0.25 mg total) by mouth at bedtime as needed. for anxiety 30 tablet 5  .  aspirin EC 81 MG tablet Take 81 mg by mouth daily.    Marland Kitchen atorvastatin (LIPITOR) 10 MG tablet Take 1 tablet (10 mg total) by mouth daily. 90 tablet 1  . busPIRone (BUSPAR) 5 MG tablet Take 1 tablet (5 mg total) by mouth daily. 90 tablet 1  . clopidogrel (PLAVIX) 75 MG tablet Take 1 tablet (75 mg total) by mouth daily. 90 tablet 1  . FLUoxetine (PROZAC) 10 MG capsule Take 1 capsule (10 mg total) by mouth daily. 90 capsule 1  . hydrOXYzine (ATARAX/VISTARIL) 10 MG tablet Take 1 tablet by mouth three times daily as needed 30 tablet 1  . levothyroxine (SYNTHROID) 112 MCG tablet  Take 1 tablet (112 mcg total) by mouth daily. 30 tablet 1  . lisinopril-hydrochlorothiazide (ZESTORETIC) 10-12.5 MG tablet Take 1 tablet by mouth daily. 90 tablet 1  . Multiple Vitamins-Minerals (MULTIVITAMIN ADULT PO) Take 1 tablet by mouth daily.     Marland Kitchen nystatin cream (MYCOSTATIN) Apply 1 application topically 2 (two) times daily. 30 g 0  . olopatadine (PATANOL) 0.1 % ophthalmic solution Place 1 drop into both eyes 2 (two) times daily. 5 mL 12  . TAGRISSO 80 MG tablet TAKE 1 TABLET (80 MG TOTAL) BY MOUTH DAILY. 30 tablet 2  . VENTOLIN HFA 108 (90 Base) MCG/ACT inhaler     . meclizine (ANTIVERT) 25 MG tablet Take 1 tablet (25 mg total) by mouth 3 (three) times daily as needed for dizziness. (Patient not taking: Reported on 03/03/2019) 30 tablet 0  . triamcinolone cream (KENALOG) 0.1 % Apply 1 application topically 2 (two) times daily. (Patient not taking: Reported on 04/25/2019) 30 g 1  . umeclidinium-vilanterol (ANORO ELLIPTA) 62.5-25 MCG/INH AEPB Inhale 1 puff into the lungs daily. (Patient not taking: Reported on 04/25/2019) 60 each 5   No current facility-administered medications for this visit.    Review of Systems  Constitutional: Negative.  Negative for chills, diaphoresis, fever, malaise/fatigue and weight loss (up 17 lbs since 11/29/2018).       Doing good. More energy. Better strength. Active.  HENT: Negative.  Negative for congestion, ear pain, hearing loss, nosebleeds, sinus pain and sore throat.   Eyes: Negative.  Negative for blurred vision, double vision, photophobia, discharge and redness.  Respiratory: Negative.  Negative for cough, sputum production, shortness of breath (uses inhaler) and wheezing.   Cardiovascular: Negative.  Negative for chest pain, palpitations, leg swelling and PND.  Gastrointestinal: Negative.  Negative for abdominal pain, blood in stool, constipation, diarrhea, heartburn, melena, nausea and vomiting.  Genitourinary: Negative.  Negative for dysuria,  frequency, hematuria and urgency.  Musculoskeletal: Negative.  Negative for back pain, joint pain and myalgias.       Peripheral vascular disease; worsening.  Skin: Negative.  Negative for itching and rash.  Neurological: Negative.  Negative for dizziness, tingling, sensory change, focal weakness, weakness and headaches.  Endo/Heme/Allergies: Does not bruise/bleed easily.       Thyroid disease on Synthroid.  Psychiatric/Behavioral: Negative.  Negative for depression, memory loss and substance abuse. The patient is not nervous/anxious (takes Xanax occasionally) and does not have insomnia.   All other systems reviewed and are negative.  Performance status (ECOG): 0  Vitals Blood pressure 115/84, pulse 69, temperature (!) 97.4 F (36.3 C), temperature source Tympanic, resp. rate 18, height 5' 9"  (1.753 m), weight 219 lb 11 oz (99.6 kg), SpO2 100 %.   Physical Exam  Constitutional: She is oriented to person, place, and time. She appears well-developed and well-nourished.  No distress.  HENT:  Head: Normocephalic and atraumatic.  Mouth/Throat: Oropharynx is clear and moist. No oropharyngeal exudate.  Shoulder length brown hair.  Eyes: Pupils are equal, round, and reactive to light. Conjunctivae and EOM are normal. No scleral icterus.  Glasses.  Brown eyes.  Cardiovascular: Normal rate, regular rhythm and normal heart sounds.  No murmur heard. Pulmonary/Chest: Effort normal and breath sounds normal. No respiratory distress. She has no wheezes. She has no rales. She exhibits no tenderness.  Abdominal: Soft. Bowel sounds are normal. She exhibits no distension and no mass. There is no abdominal tenderness. There is no rebound and no guarding.  Musculoskeletal:        General: No tenderness or edema. Normal range of motion.     Cervical back: Normal range of motion and neck supple.  Lymphadenopathy:       Head (right side): No preauricular, no posterior auricular and no occipital adenopathy  present.       Head (left side): No preauricular, no posterior auricular and no occipital adenopathy present.    She has no cervical adenopathy.    She has no axillary adenopathy.       Right: No inguinal and no supraclavicular adenopathy present.       Left: No inguinal and no supraclavicular adenopathy present.  Neurological: She is alert and oriented to person, place, and time.  Skin: Skin is warm and dry. She is not diaphoretic.  Psychiatric: She has a normal mood and affect. Her behavior is normal. Judgment and thought content normal.  Nursing note and vitals reviewed.   Appointment on 06/20/2019  Component Date Value Ref Range Status  . Sodium 06/20/2019 137  135 - 145 mmol/L Final  . Potassium 06/20/2019 4.0  3.5 - 5.1 mmol/L Final  . Chloride 06/20/2019 103  98 - 111 mmol/L Final  . CO2 06/20/2019 24  22 - 32 mmol/L Final  . Glucose, Bld 06/20/2019 74  70 - 99 mg/dL Final   Glucose reference range applies only to samples taken after fasting for at least 8 hours.  . BUN 06/20/2019 17  8 - 23 mg/dL Final  . Creatinine, Ser 06/20/2019 0.90  0.44 - 1.00 mg/dL Final  . Calcium 06/20/2019 8.9  8.9 - 10.3 mg/dL Final  . Total Protein 06/20/2019 7.2  6.5 - 8.1 g/dL Final  . Albumin 06/20/2019 3.8  3.5 - 5.0 g/dL Final  . AST 06/20/2019 20  15 - 41 U/L Final  . ALT 06/20/2019 23  0 - 44 U/L Final  . Alkaline Phosphatase 06/20/2019 74  38 - 126 U/L Final  . Total Bilirubin 06/20/2019 0.5  0.3 - 1.2 mg/dL Final  . GFR calc non Af Amer 06/20/2019 >60  >60 mL/min Final  . GFR calc Af Amer 06/20/2019 >60  >60 mL/min Final  . Anion gap 06/20/2019 10  5 - 15 Final   Performed at [facility], [address], [place], [place] [address]  . WBC 06/20/2019 5.9  4.0 - 10.5 K/uL Final  . RBC 06/20/2019 4.17  3.87 - 5.11 MIL/uL Final  . Hemoglobin 06/20/2019 12.8  12.0 - 15.0 g/dL Final  . HCT 06/20/2019 39.3  36.0 - 46.0 % Final  . MCV 06/20/2019 94.2  80.0 - 100.0 fL Final  .  MCH 06/20/2019 30.7  26.0 - 34.0 pg Final  . MCHC 06/20/2019 32.6  30.0 - 36.0 g/dL Final  . RDW 06/20/2019 13.0  11.5 - 15.5 % Final  .  Platelets 06/20/2019 233  150 - 400 K/uL Final  . nRBC 06/20/2019 0.0  0.0 - 0.2 % Final  . Neutrophils Relative % 06/20/2019 53  % Final  . Neutro Abs 06/20/2019 3.1  1.7 - 7.7 K/uL Final  . Lymphocytes Relative 06/20/2019 35  % Final  . Lymphs Abs 06/20/2019 2.0  0.7 - 4.0 K/uL Final  . Monocytes Relative 06/20/2019 10  % Final  . Monocytes Absolute 06/20/2019 0.6  0.1 - 1.0 K/uL Final  . Eosinophils Relative 06/20/2019 1  % Final  . Eosinophils Absolute 06/20/2019 0.1  0.0 - 0.5 K/uL Final  . Basophils Relative 06/20/2019 1  % Final  . Basophils Absolute 06/20/2019 0.0  0.0 - 0.1 K/uL Final  . Immature Granulocytes 06/20/2019 0  % Final  . Abs Immature Granulocytes 06/20/2019 0.02  0.00 - 0.07 K/uL Final   Performed at [facility], [address] [place], [place] [address]    Assessment:  Kristina Wright is a 64 y.o. female with stage IV adenocarcinoma of the lungs/p right axillary biopsy on 02/24/2018. Pathologyrevealed metastatic adenocarcinoma c/w lung primary. Tumor cells were positive for CK7 and TTF-1 with minimal blush reactivity for CK20. Tumor cells were negative for GATA3, ER, and PR. Omniseq testingrevealed EGFR c.2582>A (L861Q) mutation and PDL-1 60% TPS. CEAwas 2.9 on 05/17/2018.  PET scanon 02/09/2018 revealed an unusual pattern of nodal metastasis with intensely hypermetabolic enlarged precarinal lymph node, RIGHT hilar lymph node and RIGHT axillary lymph node. Recommend biopsy of the RIGHT axillarylymph node. There was no metabolic activity associated with the RIGHT lobe pulmonary nodule. There was no additional evidence primary or metastatic carcinoma. Clinical stageT1N3M1.  Chest CTon 11/24/2018 revealed an enlarging left lower lobe pulmonary nodule measuring1.5 x 1.4 cm, concerning for progressive  malignancy. There was mild diffuse bronchial wall thickening with mild centrilobular and paraseptal emphysema; imaging findings suggestive of underlying COPD.  PETscanon 12/29/2018 revealedan isolated 1.2 cm hypermetabolic LLL pulmonary nodule (SUV 8.0). There was no evidence of metastasis. Sherefused a biopsy.   Shereceived SBRT(6,000 cGy in 5 fractions) to the left lower lobe nodulefrom 02/03/2019 - 02/28/2019.  Chest CT on 04/20/2019 showed an interval reduction in volume of the left lower lobe nodule following stereotactic body radiation treatment. There was no new or progressive malignancy.  Head MRIon 03/10/2018 revealed no evidence of metastatic disease.  She began osimertinibon 03/25/2018.[place] 07/27/2018 showed an ejection fraction in the range of 60-65%. She is tolerating treatment well.  Diagnostic left mammogramon 11/01/2018 revealed a benign left breast mass unchanged mammographically since at least 2016. There were no suspicious findings in the left breast.  She has a history of cervical cancerin 1989. She underwent cryotherapy.  She received the first dose of the COVID-19 vaccine on 06/17/2019.   Symptomatically, she feels good.  She has gained weight.  Exam is stable.  Plan: 1.   Labs today: CBC with diff, CMP. 2.Stage IV adenocarcinoma of the Lung (EGFR L861Q mutation) Clinically,  she continues to do well. PET scan on 12/29/2018 revealed an isolated 1.2 cm LLL pulmonary nodule. She received SBRT (completed 02/28/2019). Chest CT on 04/20/2019 revealed interval reduction in volume of LLL lesion.  Continue osimertinib 80 mg a day.  Discuss plans for restaging chest CT. 3.Elevated TSH             TSH 14.40 (0.45 - 4.5) on 03/03/2019.  TSH 3.341 and free T4 0.88 on 06/20/2019.  Synthroid was increased to 110 mcg/day.             Continue to monitor. 4.    Port-a-cath Port-A-Cath flush every 12 weeks during COVID-19 pandemic. 5.   Chest CT 07/19/2019. 6.   RTC after chest CT for MD assessment (Doximetry or WebEx) and review of imaging.  I discussed the assessment and treatment plan with the patient.  The patient was provided an opportunity to ask questions and all were answered.  The patient agreed with the plan and demonstrated an understanding of the instructions.  The patient was advised to call back if the symptoms worsen or if the condition fails to improve as anticipated.   Lequita Asal, MD, PhD    06/20/2019, 11:34 AM  I, Selena Batten, am acting as scribe for [employer]. Mike Gip, MD, PhD.  I, Doree Kuehne C. Mike Gip, MD, have reviewed the above documentation for accuracy and completeness, and I agree with the above.

## 2019-06-17 ENCOUNTER — Other Ambulatory Visit: Payer: Self-pay

## 2019-06-17 ENCOUNTER — Ambulatory Visit: Payer: BLUE CROSS/BLUE SHIELD | Attending: Internal Medicine

## 2019-06-17 DIAGNOSIS — E039 Hypothyroidism, unspecified: Secondary | ICD-10-CM

## 2019-06-17 DIAGNOSIS — Z23 Encounter for immunization: Secondary | ICD-10-CM | POA: Insufficient documentation

## 2019-06-17 LAB — THYROID PANEL WITH TSH
Free Thyroxine Index: 2.1 (ref 1.2–4.9)
T3 Uptake Ratio: 23 % — ABNORMAL LOW (ref 24–39)
T4, Total: 9.3 ug/dL (ref 4.5–12.0)
TSH: 6.41 u[IU]/mL — ABNORMAL HIGH (ref 0.450–4.500)

## 2019-06-17 MED ORDER — LEVOTHYROXINE SODIUM 112 MCG PO TABS: 112.0000 ug | ORAL_TABLET | Freq: Every day | ORAL | 1 refills | Status: DC

## 2019-06-17 NOTE — Progress Notes (Unsigned)
Sent in med

## 2019-06-17 NOTE — Progress Notes (Signed)
   Covid-19 Vaccination Clinic  Name:  Kristina Wright    MRN: [id number] DOB: 1956-02-21  06/17/2019  Kristina Wright was observed post Covid-19 immunization for 15 minutes without incidence. She was provided with Vaccine Information Sheet and instruction to access the V-Safe system.   Kristina Wright was instructed to call 911 with any severe reactions post vaccine: Marland Kitchen Difficulty breathing  . Swelling of your face and throat  . A fast heartbeat  . A bad rash all over your body  . Dizziness and weakness    Immunizations Administered    Name Date Dose VIS Date Route   Pfizer COVID-19 Vaccine 06/17/2019 10:46 AM 0.3 mL 04/01/2019 Intramuscular   Manufacturer: [place]   Lot: [id number]   [place]: [id number]

## 2019-06-20 ENCOUNTER — Encounter: Payer: Self-pay | Admitting: Hematology and Oncology

## 2019-06-20 ENCOUNTER — Other Ambulatory Visit: Payer: Self-pay | Admitting: Hematology and Oncology

## 2019-06-20 ENCOUNTER — Inpatient Hospital Stay: Payer: BLUE CROSS/BLUE SHIELD | Attending: Hematology and Oncology

## 2019-06-20 ENCOUNTER — Other Ambulatory Visit: Payer: Self-pay

## 2019-06-20 ENCOUNTER — Inpatient Hospital Stay (HOSPITAL_BASED_OUTPATIENT_CLINIC_OR_DEPARTMENT_OTHER): Payer: BLUE CROSS/BLUE SHIELD | Admitting: Hematology and Oncology

## 2019-06-20 VITALS — BP 115/84 | HR 69 | Temp 97.4°F | Resp 18 | Ht 69.0 in | Wt 219.7 lb

## 2019-06-20 DIAGNOSIS — R7989 Other specified abnormal findings of blood chemistry: Secondary | ICD-10-CM

## 2019-06-20 DIAGNOSIS — C773 Secondary and unspecified malignant neoplasm of axilla and upper limb lymph nodes: Secondary | ICD-10-CM | POA: Insufficient documentation

## 2019-06-20 DIAGNOSIS — C7800 Secondary malignant neoplasm of unspecified lung: Secondary | ICD-10-CM

## 2019-06-20 DIAGNOSIS — R946 Abnormal results of thyroid function studies: Secondary | ICD-10-CM | POA: Diagnosis not present

## 2019-06-20 DIAGNOSIS — R911 Solitary pulmonary nodule: Secondary | ICD-10-CM

## 2019-06-20 DIAGNOSIS — C3490 Malignant neoplasm of unspecified part of unspecified bronchus or lung: Secondary | ICD-10-CM

## 2019-06-20 LAB — COMPREHENSIVE METABOLIC PANEL
ALT: 23 U/L (ref 0–44)
AST: 20 U/L (ref 15–41)
Albumin: 3.8 g/dL (ref 3.5–5.0)
Alkaline Phosphatase: 74 U/L (ref 38–126)
Anion gap: 10 (ref 5–15)
BUN: 17 mg/dL (ref 8–23)
CO2: 24 mmol/L (ref 22–32)
Calcium: 8.9 mg/dL (ref 8.9–10.3)
Chloride: 103 mmol/L (ref 98–111)
Creatinine, Ser: 0.9 mg/dL (ref 0.44–1.00)
GFR calc Af Amer: >60 mL/min (ref >60)
GFR calc non Af Amer: >60 mL/min (ref >60)
Glucose, Bld: 74 mg/dL (ref 70–99)
Potassium: 4 mmol/L (ref 3.5–5.1)
Sodium: 137 mmol/L (ref 135–145)
Total Bilirubin: 0.5 mg/dL (ref 0.3–1.2)
Total Protein: 7.2 g/dL (ref 6.5–8.1)

## 2019-06-20 LAB — CBC WITH DIFFERENTIAL/PLATELET
Abs Immature Granulocytes: 0.02 10*3/uL (ref 0.00–0.07)
Basophils Absolute: 0 10*3/uL (ref 0.0–0.1)
Basophils Relative: 1 %
Eosinophils Absolute: 0.1 10*3/uL (ref 0.0–0.5)
Eosinophils Relative: 1 %
HCT: 39.3 % (ref 36.0–46.0)
Hemoglobin: 12.8 g/dL (ref 12.0–15.0)
Immature Granulocytes: 0 %
Lymphocytes Relative: 35 %
Lymphs Abs: 2 10*3/uL (ref 0.7–4.0)
MCH: 30.7 pg (ref 26.0–34.0)
MCHC: 32.6 g/dL (ref 30.0–36.0)
MCV: 94.2 fL (ref 80.0–100.0)
Monocytes Absolute: 0.6 10*3/uL (ref 0.1–1.0)
Monocytes Relative: 10 %
Neutro Abs: 3.1 10*3/uL (ref 1.7–7.7)
Neutrophils Relative %: 53 %
Platelets: 233 10*3/uL (ref 150–400)
RBC: 4.17 MIL/uL (ref 3.87–5.11)
RDW: 13 % (ref 11.5–15.5)
WBC: 5.9 10*3/uL (ref 4.0–10.5)
nRBC: 0 % (ref 0.0–0.2)

## 2019-06-20 LAB — T4, FREE: Free T4: 0.88 ng/dL (ref 0.61–1.12)

## 2019-06-20 LAB — TSH: TSH: 3.341 u[IU]/mL (ref 0.350–4.500)

## 2019-06-20 NOTE — Progress Notes (Signed)
No new changes noted today 

## 2019-06-21 ENCOUNTER — Telehealth: Payer: Self-pay

## 2019-06-21 NOTE — Telephone Encounter (Signed)
The patient labs has been faxed to Dr Ronnald Ramp office.

## 2019-07-05 ENCOUNTER — Other Ambulatory Visit: Payer: Self-pay

## 2019-07-06 ENCOUNTER — Ambulatory Visit
Admission: RE | Admit: 2019-07-06 | Discharge: 2019-07-06 | Disposition: A | Discharge status: Home / Self Care | Payer: BLUE CROSS/BLUE SHIELD | Source: Ambulatory Visit | Attending: Radiation Oncology | Admitting: Radiation Oncology

## 2019-07-06 VITALS — BP 139/80 | HR 86 | Temp 97.9°F | Resp 16 | Wt 217.3 lb

## 2019-07-06 DIAGNOSIS — C3432 Malignant neoplasm of lower lobe, left bronchus or lung: Secondary | ICD-10-CM | POA: Diagnosis not present

## 2019-07-06 DIAGNOSIS — Z923 Personal history of irradiation: Secondary | ICD-10-CM | POA: Diagnosis not present

## 2019-07-06 DIAGNOSIS — Z87891 Personal history of nicotine dependence: Secondary | ICD-10-CM | POA: Insufficient documentation

## 2019-07-06 DIAGNOSIS — C801 Malignant (primary) neoplasm, unspecified: Secondary | ICD-10-CM

## 2019-07-06 DIAGNOSIS — C7800 Secondary malignant neoplasm of unspecified lung: Secondary | ICD-10-CM

## 2019-07-06 NOTE — Progress Notes (Signed)
Radiation Oncology Follow up Note  Name: Kristina Wright   Date:   07/06/2019 MRN:  [id number] DOB: 28-Feb-1956    This 64 y.o. female presents to the clinic today for 71-month follow-up status post SBRT to left lower lobe and patient with known stage IV adenocarcinoma of the lung currently on immunotherapy.  REFERRING PROVIDER: Juline Patch, MD  HPI: Patient is a 64 year old female now out 4 months having completed SBRT to left lower lobe for hypermetabolic pulmonary nodule patient with known stage IV adenocarcinoma lung currently on immunotherapy.  She is seen today in routine follow-up and is doing well.  She specifically denies cough hemoptysis chest tightness or bone pain..  She had a CT scan back in December which I have reviewed shows interval reduction involving the left lower lobe consistent with stereotactic treatment.  No new or progressive malignancy was seen.  She is questions about her port removal and is going to talking t about that with Dr. Mike Gip in the next several weeks.  She is also scheduled for CT scan the end of this month.  She is currentlyon osimertinib .  COMPLICATIONS OF TREATMENT: none  FOLLOW UP COMPLIANCE: keeps appointments   PHYSICAL EXAM:  BP 139/80   Pulse 86   Temp 97.9 F (36.6 C) (Tympanic)   Resp 16   Wt 217 lb 4.8 oz (98.6 kg)   BMI 32.09 kg/m  Well-developed well-nourished patient in NAD. HEENT reveals PERLA, EOMI, discs not visualized.  Oral cavity is clear. No oral mucosal lesions are identified. Neck is clear without evidence of cervical or supraclavicular adenopathy. Lungs are clear to A&P. Cardiac examination is essentially unremarkable with regular rate and rhythm without murmur rub or thrill. Abdomen is benign with no organomegaly or masses noted. Motor sensory and DTR levels are equal and symmetric in the upper and lower extremities. Cranial nerves II through XII are grossly intact. Proprioception is intact. No peripheral adenopathy or edema  is identified. No motor or sensory levels are noted. Crude visual fields are within normal range.  RADIOLOGY RESULTS: CT scans reviewed compatible with above-stated findings I have requested review of her CT scan at the end of this month.  PLAN: Present time patient is doing well currently on on osimertinib .  She has discussed removing her Port-A-Cath she will discuss that further with Dr. Mike Gip I have asked to see her back in 6 months for follow-up.  Patient knows to call with any concerns.  I would like to take this opportunity to thank you for allowing me to participate in the care of your patient.Noreene Filbert, MD

## 2019-07-12 ENCOUNTER — Ambulatory Visit: Payer: BLUE CROSS/BLUE SHIELD | Attending: Internal Medicine

## 2019-07-12 DIAGNOSIS — Z23 Encounter for immunization: Secondary | ICD-10-CM

## 2019-07-12 NOTE — Progress Notes (Signed)
   Covid-19 Vaccination Clinic  Name:  Kristina Wright    MRN: [id number] DOB: Dec 16, 1955  07/12/2019  Kristina Wright was observed post Covid-19 immunization for 15 minutes without incident. She was provided with Vaccine Information Sheet and instruction to access the V-Safe system.   Kristina Wright was instructed to call 911 with any severe reactions post vaccine: Marland Kitchen Difficulty breathing  . Swelling of face and throat  . A fast heartbeat  . A bad rash all over body  . Dizziness and weakness   Immunizations Administered    Name Date Dose VIS Date Route   Pfizer COVID-19 Vaccine 07/12/2019  3:42 PM 0.3 mL 04/01/2019 Intramuscular   Manufacturer: [place]   Lot: [id number]   [place]: [id number]

## 2019-07-14 MED FILL — TAGRISSO 80 MG TABLET: 80 | 30 days supply | Qty: 30 | Fill #1

## 2019-07-20 ENCOUNTER — Other Ambulatory Visit: Payer: Self-pay

## 2019-07-20 ENCOUNTER — Ambulatory Visit
Admission: RE | Admit: 2019-07-20 | Discharge: 2019-07-20 | Disposition: A | Discharge status: Home / Self Care | Payer: BLUE CROSS/BLUE SHIELD | Source: Ambulatory Visit | Attending: Hematology and Oncology | Admitting: Hematology and Oncology

## 2019-07-20 DIAGNOSIS — C3490 Malignant neoplasm of unspecified part of unspecified bronchus or lung: Secondary | ICD-10-CM | POA: Insufficient documentation

## 2019-07-20 DIAGNOSIS — R911 Solitary pulmonary nodule: Secondary | ICD-10-CM | POA: Diagnosis present

## 2019-07-21 NOTE — Progress Notes (Signed)
[facility]  [address], [address] [place], [place] [address] Phone: [phone]  Fax: [phone]   Telemedicine Office Visit:  07/25/2019  Referring physician: Juline Patch, MD  I connected with Kristina Wright on 07/25/2019 at 3:03 PM by videoconferencing and verified that I was speaking with the correct person using 2 identifiers.  The patient was at home.  I discussed the limitations, risk, security and privacy concerns of performing an evaluation and management service by videoconferencing and the availability of in person appointments.  I also discussed with the patient that there may be a patient responsible charge related to this service.  The patient expressed understanding and agreed to proceed.   Chief Complaint: Kristina Wright is a 64 y.o. female with stage IV adenocarcinoma of the lung on osimertinib who is seen for a 1 month assessment and review of interval chest CT.   HPI: The patient was last seen in the medical oncology clinic on 06/20/2019. At that time, she felt good. She had gained weight. Exam was stable. CBC and CMP were normal. TSH was 3.341 and free T4 was 0.88. We discussed need for follow-up chest CT for restaging.  Her Synthroid was increased to 110 mcg/day.  Patient continued osimertinib 80 mg per day.   She had a follow up with Dr. Baruch Gouty on 07/06/2019. She was doing well and had no complaints. She was considering port-a-cath removal. Patient has a follow up on 01/12/2020.   Chest CT on 07/20/2019 revealed continued evolutionary changes secondary to external beam radiation involving the lingula and superior segment of left lower lobe. There was no specific findings to suggest residual or recurrence of tumor. There was emphysema and aortic atherosclerosis. There was left circumflex coronary artery calcification.   During the interim, she has felt "ok".  She denied follow-up with vascular surgery to assess for possible claudication. She  comments that Dr. Ronnald Ramp could have put her on a medication to see if it would help. She is considering port removal.  She notes headaches daily since her second COVID-19 vaccine on 07/13/2019. Her headaches radiate from the back of her head to the front on the right side. She takes Tylenol for pain as needed.  She reports that she was not drinking much water; she has since increased her fluid intake and her headaches have improved.  Patient denies any sinus drainage or congestion. She has no vision changes. She reports once in awhile numbness in the right hand radiating up the arm. She has had carpal tunnel surgery; symptoms are similar.  I advised the patient to call the clinic if her headaches worsen or any concerns.   Past Medical History:  Diagnosis Date  . Cancer ([place]) 1989   cervical  . Depression   . Family history of breast cancer   . Family history of colon cancer   . Family history of ovarian cancer   . Family history of stomach cancer   . Family history of throat cancer   . Hyperlipidemia   . Hypertension   . Malignant neoplasm of lung ([place]) 03/01/2018  . Thyroid disease     Past Surgical History:  Procedure Laterality Date  . BILATERAL CARPAL TUNNEL RELEASE    . BREAST CYST ASPIRATION Left   . LUNG BIOPSY    . PARTIAL HYSTERECTOMY    . PORTA CATH INSERTION N/A 03/03/2018   Procedure: PORTA CATH INSERTION;  Surgeon: Katha Cabal, MD;  Location: [facility];  Service: Cardiovascular;  Laterality: N/A;  . TUBAL LIGATION      Family History  Problem Relation Age of Onset  . Colon cancer Mother 22  . Colon cancer Maternal Grandmother 78  . Hypertension Father   . Breast cancer Sister 51  . Throat cancer Brother   . Heart attack Maternal Aunt   . Ovarian cancer Maternal Aunt 70  . Stomach cancer Paternal Grandmother        dx >50  . Throat cancer Brother     Social History:  reports that she quit smoking about 20 months ago. Her smoking use included  cigarettes. She has a 25.00 pack-year smoking history. She has never used smokeless tobacco. She reports current drug use. Drug: Marijuana. She reports that she does not drink alcohol. 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 Her first grandson named was born in 06/2019. The patient is alone today.  Participants in the patient's visit and their role in the encounter included the patient and Vito Berger, CMA, today.  The intake visit was provided by Vito Berger, CMA.  Allergies: No Known Allergies  Current Medications: Current Outpatient Medications  Medication Sig Dispense Refill  . ALPRAZolam (XANAX) 0.25 MG tablet Take 1 tablet (0.25 mg total) by mouth at bedtime as needed. for anxiety 30 tablet 5  . aspirin EC 81 MG tablet Take 81 mg by mouth daily.    Marland Kitchen atorvastatin (LIPITOR) 10 MG tablet Take 1 tablet (10 mg total) by mouth daily. 90 tablet 1  . busPIRone (BUSPAR) 5 MG tablet Take 1 tablet (5 mg total) by mouth daily. 90 tablet 1  . clopidogrel (PLAVIX) 75 MG tablet Take 1 tablet (75 mg total) by mouth daily. 90 tablet 1  . FLUoxetine (PROZAC) 10 MG capsule Take 1 capsule (10 mg total) by mouth daily. 90 capsule 1  . hydrOXYzine (ATARAX/VISTARIL) 10 MG tablet Take 1 tablet by mouth three times daily as needed 30 tablet 1  . levothyroxine (SYNTHROID) 112 MCG tablet Take 1 tablet (112 mcg total) by mouth daily. 30 tablet 1  . lisinopril-hydrochlorothiazide (ZESTORETIC) 10-12.5 MG tablet Take 1 tablet by mouth daily. 90 tablet 1  . meclizine (ANTIVERT) 25 MG tablet Take 1 tablet (25 mg total) by mouth 3 (three) times daily as needed for dizziness. 30 tablet 0  . Multiple Vitamins-Minerals (MULTIVITAMIN ADULT PO) Take 1 tablet by mouth daily.     Marland Kitchen nystatin cream (MYCOSTATIN) Apply 1 application  topically 2 (two) times daily. 30 g 0  . olopatadine (PATANOL) 0.1 % ophthalmic solution Place 1 drop into both eyes 2 (two) times daily. 5 mL 12  . TAGRISSO 80 MG tablet TAKE 1 TABLET (80 MG TOTAL) BY MOUTH DAILY. 30 tablet 2  . triamcinolone cream (KENALOG) 0.1 % Apply 1 application topically 2 (two) times daily. 30 g 1  . umeclidinium-vilanterol (ANORO ELLIPTA) 62.5-25 MCG/INH AEPB Inhale 1 puff into the lungs daily. 60 each 5  . VENTOLIN HFA 108 (90 Base) MCG/ACT inhaler      No current facility-administered medications for this visit.     Review of Systems  Constitutional: Negative.  Negative for chills, diaphoresis, fever, malaise/fatigue and weight loss.       Feels "ok".  HENT: Negative.  Negative for congestion, ear pain, hearing loss, nosebleeds, sinus pain and sore throat.   Eyes: Negative.  Negative for blurred vision,  double vision, photophobia, discharge and redness.  Respiratory: Negative.  Negative for cough, sputum production, shortness of breath (uses inhaler) and wheezing.   Cardiovascular: Negative.  Negative for chest pain, palpitations, leg swelling and PND.       Pain on ambulation.  Gastrointestinal: Negative.  Negative for abdominal pain, blood in stool, constipation, diarrhea, heartburn, melena, nausea and vomiting.  Genitourinary: Negative.  Negative for dysuria, frequency, hematuria and urgency.  Musculoskeletal: Negative.  Negative for back pain, falls, joint pain, myalgias and neck pain.  Skin: Negative.  Negative for itching and rash.  Neurological: Positive for sensory change (once in awhile numbness in right hand) and headaches (daily since COVID-19 vaccine on 07/13/2019). Negative for dizziness, tingling, tremors, focal weakness and weakness.  Endo/Heme/Allergies: Does not bruise/bleed easily.       Thyroid disease on Synthroid.  Psychiatric/Behavioral: Negative.  Negative for depression, memory loss and substance abuse. The patient is not nervous/anxious  (takes Xanax occasionally) and does not have insomnia.   All other systems reviewed and are negative.   Performance status (ECOG): 1  Physical Exam  Constitutional: She is oriented to person, place, and time. She appears well-developed and well-nourished. No distress.  HENT:  Head: Normocephalic and atraumatic.  Shoulder length brown hair.  Eyes: Conjunctivae and EOM are normal. No scleral icterus.  Glasses.  Brown eyes.  Neurological: She is alert and oriented to person, place, and time.  Skin: She is not diaphoretic.  Psychiatric: She has a normal mood and affect. Her behavior is normal. Judgment and thought content normal.  Nursing note reviewed.   No visits with results within 3 Day(s) from this visit.  Latest known visit with results is:  Appointment on 06/20/2019  Component Date Value Ref Range Status  . Free T4 06/20/2019 0.88  0.61 - 1.12 ng/dL Final   Comment: (NOTE) Biotin ingestion may interfere with free T4 tests. If the results are inconsistent with the TSH level, previous test results, or the clinical presentation, then consider biotin interference. If needed, order repeat testing after stopping biotin. Performed at [facility], [address], [place], [place] [address]   . TSH 06/20/2019 3.341  0.350 - 4.500 uIU/mL Final   Comment: Performed by a 3rd Generation assay with a functional sensitivity of <=0.01 uIU/mL. Performed at [facility], [address], [place], [place] [address]   . Sodium 06/20/2019 137  135 - 145 mmol/L Final  . Potassium 06/20/2019 4.0  3.5 - 5.1 mmol/L Final  . Chloride 06/20/2019 103  98 - 111 mmol/L Final  . CO2 06/20/2019 24  22 - 32 mmol/L Final  . Glucose, Bld 06/20/2019 74  70 - 99 mg/dL Final   Glucose reference range applies only to samples taken after fasting for at least 8 hours.  . BUN 06/20/2019 17  8 - 23 mg/dL Final  . Creatinine, Ser 06/20/2019 0.90  0.44 - 1.00 mg/dL Final  . Calcium 06/20/2019  8.9  8.9 - 10.3 mg/dL Final  . Total Protein 06/20/2019 7.2  6.5 - 8.1 g/dL Final  . Albumin 06/20/2019 3.8  3.5 - 5.0 g/dL Final  . AST 06/20/2019 20  15 - 41 U/L Final  . ALT 06/20/2019 23  0 - 44 U/L Final  . Alkaline Phosphatase 06/20/2019 74  38 - 126 U/L Final  . Total Bilirubin 06/20/2019 0.5  0.3 - 1.2 mg/dL Final  . GFR calc non Af Amer 06/20/2019 >60  >60 mL/min Final  . GFR  calc Af Amer 06/20/2019 >60  >60 mL/min Final  . Anion gap 06/20/2019 10  5 - 15 Final   Performed at [facility], [address], [place], [place] [address]  . WBC 06/20/2019 5.9  4.0 - 10.5 K/uL Final  . RBC 06/20/2019 4.17  3.87 - 5.11 MIL/uL Final  . Hemoglobin 06/20/2019 12.8  12.0 - 15.0 g/dL Final  . HCT 06/20/2019 39.3  36.0 - 46.0 % Final  . MCV 06/20/2019 94.2  80.0 - 100.0 fL Final  . MCH 06/20/2019 30.7  26.0 - 34.0 pg Final  . MCHC 06/20/2019 32.6  30.0 - 36.0 g/dL Final  . RDW 06/20/2019 13.0  11.5 - 15.5 % Final  . Platelets 06/20/2019 233  150 - 400 K/uL Final  . nRBC 06/20/2019 0.0  0.0 - 0.2 % Final  . Neutrophils Relative % 06/20/2019 53  % Final  . Neutro Abs 06/20/2019 3.1  1.7 - 7.7 K/uL Final  . Lymphocytes Relative 06/20/2019 35  % Final  . Lymphs Abs 06/20/2019 2.0  0.7 - 4.0 K/uL Final  . Monocytes Relative 06/20/2019 10  % Final  . Monocytes Absolute 06/20/2019 0.6  0.1 - 1.0 K/uL Final  . Eosinophils Relative 06/20/2019 1  % Final  . Eosinophils Absolute 06/20/2019 0.1  0.0 - 0.5 K/uL Final  . Basophils Relative 06/20/2019 1  % Final  . Basophils Absolute 06/20/2019 0.0  0.0 - 0.1 K/uL Final  . Immature Granulocytes 06/20/2019 0  % Final  . Abs Immature Granulocytes 06/20/2019 0.02  0.00 - 0.07 K/uL Final   Performed at [facility], [address] [place], [place] [address]    Assessment:  Kristina Wright is a 64 y.o. female with stage IV adenocarcinoma of the lungs/p right axillary biopsy on 02/24/2018. Pathologyrevealed metastatic  adenocarcinoma c/w lung primary. Tumor cells were positive for CK7 and TTF-1 with minimal blush reactivity for CK20. Tumor cells were negative for GATA3, ER, and PR. Omniseq testingrevealed EGFR c.2582>A (L861Q) mutation and PDL-1 60% TPS. CEAwas 2.9 on 05/17/2018.  PET scanon 02/09/2018 revealed an unusual pattern of nodal metastasis with intensely hypermetabolic enlarged precarinal lymph node, RIGHT hilar lymph node and RIGHT axillary lymph node. Recommend biopsy of the RIGHT axillarylymph node. There was no metabolic activity associated with the RIGHT lobe pulmonary nodule. There was no additional evidence primary or metastatic carcinoma. Clinical stageT1N3M1.  Chest CTon 11/24/2018 revealed an enlarging left lower lobe pulmonary nodule measuring1.5 x 1.4 cm, concerning for progressive malignancy. There was mild diffuse bronchial wall thickening with mild centrilobular and paraseptal emphysema; imaging findings suggestive of underlying COPD.  PETscanon 12/29/2018 revealedan isolated 1.2 cm hypermetabolic LLL pulmonary nodule (SUV 8.0). There was no evidence of metastasis. Sherefused a biopsy.   Shereceived SBRT(6,000 cGy in 5 fractions) to the left lower lobe nodulefrom 02/03/2019 - 02/28/2019.  ChestCTon 04/20/2019 showed an interval reduction in volume of theleftlower lobe nodule following stereotactic body radiation treatment. There was no new or progressive malignancy.  Chest CT on 07/20/2019 revealed continued evolutionary changes secondary to external beam radiation involving the lingula and superior segment of left lower lobe. There was no specific findings to suggest residual or recurrence of tumor.  Head MRIon 03/10/2018 revealed no evidence of metastatic disease.  She began osimertinibon 03/25/2018.Echoon 07/27/2018 showed an ejection fraction in the range of 60-65%. She is tolerating treatment well.  Diagnostic left mammogramon 11/01/2018  revealed a benign left breast mass unchanged mammographically since at least  2016. There were no suspicious findings in the left breast.  She has a history of cervical cancerin 1989. She underwent cryotherapy.  She received her second dose of the COVID-19 vaccine on 07/13/2019.   Symptomatically, she feels "ok".  She denies any shortness of breath or cough.  She has had headaches since her second COVID-19 vaccine.  She has intermittent right hand numbness similar to prior carpal tunnel disease.  Plan: 1.    Stage IV adenocarcinoma of the Lung (EGFR L861Q mutation) Clinically, she appears to be doing well. PET scan on 12/29/2018 revealed an isolated 1.2 cm LLL pulmonary nodule. She received SBRT (completed 02/28/2019).  Chest CT on 07/20/2019 was personally reviewed.  Agree with radiology findings.   There was continued evolutionary changes secondary to radiation involving the lingula and superior segment of left lower lobe.    There was no residual or recurrence of tumor.  Discuss plans for complete restaging (chest, abdomen, and pelvic CT).             Continue osimertinib 80 mg a day. 2.Headache Patient notes headache since COVID-19 vaccine.  Symptoms controlled with Tylenol prn.  Patient to contact clinic if symptoms do not resolve/worsen. 3.Port-a-cath Continue port-a-cath flushes every 12 weeks during COVID-19 pandemic. 4.   Please send copy of chest CT to patient. 5.   Schedule chest, abdomen, and pelvis CT on 11/21/2019. 6.   RTC 09/20/2019 for labs (CBC with diff, CMP, Mg, CEA, LDH). 7.   RTC after CT scans for MD assessment, labs (CBC with diff, CMP, Mg), port flush and review of scans.  I discussed the assessment and treatment plan with the patient.  The patient was provided an opportunity to ask questions and all were answered.  The patient agreed with the plan and demonstrated an  understanding of the instructions.  The patient was advised to call back or seek an in person evaluation if the symptoms worsen or if the condition fails to improve as anticipated.  I provided 20 minutes (3:03 PM - 3:23 PM) of face-to-face video visit time during this this encounter and > 50% was spent counseling as documented under my assessment and plan.  I provided these services from the [facility] office.   Nolon Stalls, MD, PhD  07/25/2019, 3:03 PM  I, Selena Batten, am acting as scribe for [employer]. Mike Gip, MD, PhD.  I, Karagan Lehr C. Mike Gip, MD, have reviewed the above documentation for accuracy and completeness, and I agree with the above.

## 2019-07-22 ENCOUNTER — Encounter: Payer: Self-pay | Admitting: Hematology and Oncology

## 2019-07-22 NOTE — Progress Notes (Signed)
No new changes noted today. The patient Name and DOB has been verified by phone today. 

## 2019-07-25 ENCOUNTER — Inpatient Hospital Stay: Payer: 59 | Attending: Hematology and Oncology | Admitting: Hematology and Oncology

## 2019-07-25 ENCOUNTER — Encounter: Payer: Self-pay | Admitting: Hematology and Oncology

## 2019-07-25 DIAGNOSIS — R911 Solitary pulmonary nodule: Secondary | ICD-10-CM | POA: Diagnosis not present

## 2019-07-25 DIAGNOSIS — Z452 Encounter for adjustment and management of vascular access device: Secondary | ICD-10-CM | POA: Insufficient documentation

## 2019-07-25 DIAGNOSIS — C7800 Secondary malignant neoplasm of unspecified lung: Secondary | ICD-10-CM | POA: Diagnosis not present

## 2019-07-25 DIAGNOSIS — C801 Malignant (primary) neoplasm, unspecified: Secondary | ICD-10-CM | POA: Diagnosis not present

## 2019-07-25 DIAGNOSIS — R519 Headache, unspecified: Secondary | ICD-10-CM | POA: Insufficient documentation

## 2019-07-25 DIAGNOSIS — C3432 Malignant neoplasm of lower lobe, left bronchus or lung: Secondary | ICD-10-CM | POA: Insufficient documentation

## 2019-07-27 ENCOUNTER — Telehealth: Payer: Self-pay | Admitting: *Deleted

## 2019-07-27 NOTE — Telephone Encounter (Signed)
Patient called stating she has some questions regarding her appointment this past Monday. Please return her call [phone].

## 2019-07-27 NOTE — Telephone Encounter (Signed)
  I ordered a chest, abdomen, and pelvic CT scan for complete restaging.  M

## 2019-07-29 ENCOUNTER — Other Ambulatory Visit: Payer: Self-pay | Admitting: Family Medicine

## 2019-07-29 DIAGNOSIS — L280 Lichen simplex chronicus: Secondary | ICD-10-CM

## 2019-08-01 ENCOUNTER — Other Ambulatory Visit: Payer: Self-pay

## 2019-08-01 ENCOUNTER — Ambulatory Visit: Payer: BLUE CROSS/BLUE SHIELD | Admitting: Family Medicine

## 2019-08-01 ENCOUNTER — Encounter: Payer: Self-pay | Admitting: Family Medicine

## 2019-08-01 VITALS — BP 130/80 | HR 80 | Ht 69.0 in | Wt 216.0 lb

## 2019-08-01 DIAGNOSIS — E039 Hypothyroidism, unspecified: Secondary | ICD-10-CM

## 2019-08-01 DIAGNOSIS — I739 Peripheral vascular disease, unspecified: Secondary | ICD-10-CM

## 2019-08-01 DIAGNOSIS — E782 Mixed hyperlipidemia: Secondary | ICD-10-CM

## 2019-08-01 DIAGNOSIS — F419 Anxiety disorder, unspecified: Secondary | ICD-10-CM | POA: Diagnosis not present

## 2019-08-01 DIAGNOSIS — F330 Major depressive disorder, recurrent, mild: Secondary | ICD-10-CM

## 2019-08-01 DIAGNOSIS — I10 Essential (primary) hypertension: Secondary | ICD-10-CM | POA: Diagnosis not present

## 2019-08-01 DIAGNOSIS — L280 Lichen simplex chronicus: Secondary | ICD-10-CM

## 2019-08-01 DIAGNOSIS — I700 Atherosclerosis of aorta: Secondary | ICD-10-CM

## 2019-08-01 DIAGNOSIS — R69 Illness, unspecified: Secondary | ICD-10-CM

## 2019-08-01 MED ORDER — ALPRAZOLAM 0.25 MG PO TABS: 0.2500 mg | ORAL_TABLET | Freq: Every evening | ORAL | 5 refills | Status: DC | PRN

## 2019-08-01 MED ORDER — ANORO ELLIPTA 62.5-25 MCG/INH IN AEPB: 1.0000 | INHALATION_SPRAY | Freq: Every day | RESPIRATORY_TRACT | 5 refills | Status: AC

## 2019-08-01 MED ORDER — ATORVASTATIN CALCIUM 10 MG PO TABS: 10.0000 mg | ORAL_TABLET | Freq: Every day | ORAL | 1 refills | Status: AC

## 2019-08-01 MED ORDER — VENTOLIN HFA 108 (90 BASE) MCG/ACT IN AERS: 1.0000 | INHALATION_SPRAY | Freq: Four times a day (QID) | RESPIRATORY_TRACT | 1 refills | Status: AC | PRN

## 2019-08-01 MED ORDER — LISINOPRIL-HYDROCHLOROTHIAZIDE 10-12.5 MG PO TABS: 1.0000 | ORAL_TABLET | Freq: Every day | ORAL | 1 refills | Status: DC

## 2019-08-01 MED ORDER — BUSPIRONE HCL 5 MG PO TABS: 5.0000 mg | ORAL_TABLET | Freq: Every day | ORAL | 1 refills | Status: AC

## 2019-08-01 MED ORDER — LEVOTHYROXINE SODIUM 112 MCG PO TABS: 112.0000 ug | ORAL_TABLET | Freq: Every day | ORAL | 1 refills | Status: AC

## 2019-08-01 MED ORDER — FLUOXETINE HCL 10 MG PO CAPS: 10.0000 mg | ORAL_CAPSULE | Freq: Every day | ORAL | 1 refills | Status: DC

## 2019-08-01 MED ORDER — HYDROXYZINE HCL 10 MG PO TABS: 10.0000 mg | ORAL_TABLET | Freq: Every day | ORAL | 5 refills | Status: DC

## 2019-08-01 NOTE — Progress Notes (Signed)
Date:  08/01/2019   Name:  Kristina Wright   DOB:  01/26/1956   MRN:  [id number]   Chief Complaint: Hypertension, Hyperlipidemia, Depression, Anxiety, Hypothyroidism, COPD, and peripheral artery disease (legs are getting worse- opend arteries in legs?/ medicine)  Hypertension This is a chronic problem. The current episode started more than 1 year ago. The problem has been waxing and waning since onset. The problem is controlled. Associated symptoms include anxiety. Pertinent negatives include no blurred vision, chest pain, headaches, malaise/fatigue, neck pain, orthopnea, palpitations, peripheral edema, PND, shortness of breath or sweats. There are no associated agents to hypertension. There are no known risk factors for coronary artery disease. Past treatments include ACE inhibitors and diuretics. The current treatment provides moderate improvement. There are no compliance problems.  There is no history of angina, kidney disease, CAD/MI, CVA, heart failure, left ventricular hypertrophy, PVD or retinopathy. Identifiable causes of hypertension include a thyroid problem. There is no history of chronic renal disease, a hypertension causing med or renovascular disease.  Hyperlipidemia This is a chronic problem. The current episode started more than 1 year ago. The problem is controlled. Recent lipid tests were reviewed and are normal. She has no history of chronic renal disease, diabetes, hypothyroidism, liver disease, obesity or nephrotic syndrome. There are no known factors aggravating her hyperlipidemia. Pertinent negatives include no chest pain, focal sensory loss, focal weakness, leg pain, myalgias or shortness of breath. Current antihyperlipidemic treatment includes statins. The current treatment provides moderate improvement of lipids. There are no compliance problems.   Depression        This is a chronic problem.  The current episode started more than 1 year ago.   The problem occurs  intermittently.  Associated symptoms include insomnia and decreased interest.  Associated symptoms include no decreased concentration, no fatigue, no helplessness, no hopelessness, not irritable, no restlessness, no myalgias, no headaches and not sad.  Past treatments include SSRIs - Selective serotonin reuptake inhibitors.  Compliance with treatment is good.  Previous treatment provided moderate relief.  Past medical history includes thyroid problem and anxiety.     Pertinent negatives include no hypothyroidism. Anxiety Presents for follow-up visit. Symptoms include excessive worry, insomnia and nervous/anxious behavior. Patient reports no chest pain, decreased concentration, depressed mood, dizziness, nausea, palpitations, restlessness or shortness of breath.   There is no history of asthma.  COPD There is no chest tightness, cough, difficulty breathing, frequent throat clearing, hemoptysis, hoarse voice, shortness of breath, sputum production or wheezing. The current episode started more than 1 year ago. Pertinent negatives include no chest pain, ear pain, fever, headaches, malaise/fatigue, myalgias, nasal congestion, PND, rhinorrhea, sneezing, sore throat or sweats. Her symptoms are alleviated by beta-agonist. She reports moderate improvement on treatment. Her past medical history is significant for COPD. There is no history of asthma, bronchiectasis, bronchitis, emphysema or pneumonia.  Thyroid Problem Presents for follow-up visit. Symptoms include anxiety and diarrhea. Patient reports no cold intolerance, constipation, depressed mood, diaphoresis, dry skin, fatigue, hair loss, heat intolerance, hoarse voice, leg swelling, menstrual problem, nail problem, palpitations, tremors, visual change or weight gain. The symptoms have been improving. Her past medical history is significant for hyperlipidemia. There is no history of diabetes or heart failure.    Lab Results  Component Value Date   CREATININE  0.90 06/20/2019   BUN 17 06/20/2019   NA 137 06/20/2019   K 4.0 06/20/2019   CL 103 06/20/2019   CO2 24 06/20/2019   Lab Results  Component Value Date   CHOL 170 03/03/2019   HDL 49 03/03/2019   LDLCALC 96 03/03/2019   TRIG 143 03/03/2019   CHOLHDL 3.5 03/03/2019   Lab Results  Component Value Date   TSH 3.341 06/20/2019   No results found for: HGBA1C Lab Results  Component Value Date   WBC 5.9 06/20/2019   HGB 12.8 06/20/2019   HCT 39.3 06/20/2019   MCV 94.2 06/20/2019   PLT 233 06/20/2019   Lab Results  Component Value Date   ALT 23 06/20/2019   AST 20 06/20/2019   ALKPHOS 74 06/20/2019   BILITOT 0.5 06/20/2019     Review of Systems  Constitutional: Negative.  Negative for chills, diaphoresis, fatigue, fever, malaise/fatigue, unexpected weight change and weight gain.  HENT: Negative for congestion, ear discharge, ear pain, hoarse voice, rhinorrhea, sinus pressure, sneezing and sore throat.   Eyes: Negative for blurred vision, photophobia, pain, discharge, redness and itching.  Respiratory: Negative for cough, hemoptysis, sputum production, shortness of breath, wheezing and stridor.   Cardiovascular: Negative for chest pain, palpitations, orthopnea and PND.  Gastrointestinal: Positive for diarrhea. Negative for abdominal pain, blood in stool, constipation, nausea and vomiting.  Endocrine: Negative for cold intolerance, heat intolerance, polydipsia, polyphagia and polyuria.  Genitourinary: Negative for dysuria, flank pain, frequency, hematuria, menstrual problem, pelvic pain, urgency, vaginal bleeding and vaginal discharge.  Musculoskeletal: Negative for arthralgias, back pain, myalgias and neck pain.  Skin: Negative for rash.  Allergic/Immunologic: Negative for environmental allergies and food allergies.  Neurological: Negative for dizziness, tremors, focal weakness, weakness, light-headedness, numbness and headaches.  Hematological: Negative for adenopathy. Does  not bruise/bleed easily.  Psychiatric/Behavioral: Positive for depression. Negative for decreased concentration and dysphoric mood. The patient is nervous/anxious and has insomnia.     Patient Active Problem List   Diagnosis Date Noted  . Elevated TSH 06/20/2019  . Chronic obstructive pulmonary disease ([place]) 12/13/2018  . Left lower lobe pulmonary nodule 11/29/2018  . Breast nodule 08/25/2018  . Anxiety 08/22/2018  . Genetic testing 06/11/2018  . Family history of breast cancer   . Family history of colon cancer   . Family history of ovarian cancer   . Family history of stomach cancer   . Family history of throat cancer   . Metastatic adenocarcinoma to lung with unknown primary site [facility]) 04/27/2018  . Depression 03/12/2018  . Malignant neoplasm of lung ([place]) 03/01/2018  . Goals of care, counseling/discussion 03/01/2018  . Tobacco use disorder 02/08/2016  . Essential hypertension 02/08/2016  . PVD (peripheral vascular disease) ([place]) 02/08/2016  . Blue toe syndrome of right lower extremity ([place]) 02/08/2016  . Adult BMI > 30 07/31/2015  . High risk medication use 07/31/2015  . Carpal tunnel syndrome on both sides 10/30/2014  . Postmenopause atrophic vaginitis 05/15/2014  . Sensory urge incontinence 05/15/2014  . Vitamin D deficiency 04/25/2014  . Extremity pain 11/22/2013  . Numbness 11/22/2013  . Sleep disorder 11/22/2013  . Chronic insomnia 11/10/2013  . Foot pain, left 11/10/2013  . Hypothyroidism 11/10/2013  . Mixed hyperlipidemia 11/10/2013    No Known Allergies  Past Surgical History:  Procedure Laterality Date  . BILATERAL CARPAL TUNNEL RELEASE    . BREAST CYST ASPIRATION Left   . LUNG BIOPSY    . PARTIAL HYSTERECTOMY    . PORTA CATH INSERTION N/A 03/03/2018   Procedure: PORTA CATH INSERTION;  Surgeon: Katha Cabal, MD;  Location: [facility];  Service: Cardiovascular;  Laterality: N/A;  . TUBAL  LIGATION      Social History   Tobacco Use  .  Smoking status: Former Smoker    Packs/day: 1.00    Years: 25.00    Pack years: 25.00    Types: Cigarettes    Quit date: 11/19/2017    Years since quitting: 1.6  . Smokeless tobacco: Never Used  Substance Use Topics  . Alcohol use: No  . Drug use: Yes    Types: Marijuana    Comment: occasional     Medication list has been reviewed and updated.  Current Meds  Medication Sig  . ALPRAZolam (XANAX) 0.25 MG tablet Take 1 tablet (0.25 mg total) by mouth at bedtime as needed. for anxiety  . aspirin EC 81 MG tablet Take 81 mg by mouth daily.  Marland Kitchen atorvastatin (LIPITOR) 10 MG tablet Take 1 tablet (10 mg total) by mouth daily.  . busPIRone (BUSPAR) 5 MG tablet Take 1 tablet (5 mg total) by mouth daily.  . clopidogrel (PLAVIX) 75 MG tablet Take 1 tablet (75 mg total) by mouth daily.  Marland Kitchen FLUoxetine (PROZAC) 10 MG capsule Take 1 capsule (10 mg total) by mouth daily.  . hydrOXYzine (ATARAX/VISTARIL) 10 MG tablet Take 1 tablet by mouth three times daily as needed (Patient taking differently: Take 10 mg by mouth daily. )  . levothyroxine (SYNTHROID) 112 MCG tablet Take 1 tablet (112 mcg total) by mouth daily.  Marland Kitchen lisinopril-hydrochlorothiazide (ZESTORETIC) 10-12.5 MG tablet Take 1 tablet by mouth daily.  . Multiple Vitamins-Minerals (MULTIVITAMIN ADULT PO) Take 1 tablet by mouth daily.   Marland Kitchen nystatin cream (MYCOSTATIN) Apply 1 application topically 2 (two) times daily.  Marland Kitchen olopatadine (PATANOL) 0.1 % ophthalmic solution Place 1 drop into both eyes 2 (two) times daily.  Marland Kitchen TAGRISSO 80 MG tablet TAKE 1 TABLET (80 MG TOTAL) BY MOUTH DAILY.  Marland Kitchen triamcinolone cream (KENALOG) 0.1 % Apply 1 application topically 2 (two) times daily.  Marland Kitchen umeclidinium-vilanterol (ANORO ELLIPTA) 62.5-25 MCG/INH AEPB Inhale 1 puff into the lungs daily.  [place] Cutter HFA 108 (90 Base) MCG/ACT inhaler     PHQ 2/9 Scores 08/01/2019 03/03/2019 12/13/2018 01/07/2018  PHQ - 2 Score 2 0 0 1  PHQ- 9 Score 4 1 1 6     BP Readings from Last 3  Encounters:  08/01/19 130/80  07/05/19 139/80  06/20/19 115/84    Physical Exam Vitals and nursing note reviewed.  Constitutional:      General: She is not irritable.    Appearance: She is well-developed.  HENT:     Head: Normocephalic.     Right Ear: Tympanic membrane, ear canal and external ear normal. There is no impacted cerumen.     Left Ear: Tympanic membrane, ear canal and external ear normal. There is no impacted cerumen.     Nose: Nose normal.     Mouth/Throat:     Mouth: Mucous membranes are moist.     Pharynx: No oropharyngeal exudate or posterior oropharyngeal erythema.  Eyes:     General: Lids are everted, no foreign bodies appreciated. No scleral icterus.       Left eye: No foreign body or hordeolum.     Conjunctiva/sclera: Conjunctivae normal.     Right eye: Right conjunctiva is not injected.     Left eye: Left conjunctiva is not injected.     Pupils: Pupils are equal, round, and reactive to light.  Neck:     Thyroid: No thyromegaly.     Vascular: No JVD.  Trachea: No tracheal deviation.  Cardiovascular:     Rate and Rhythm: Normal rate and regular rhythm.     Heart sounds: Normal heart sounds. No murmur. No friction rub. No gallop.   Pulmonary:     Effort: Pulmonary effort is normal. No respiratory distress.     Breath sounds: Normal breath sounds. No wheezing, rhonchi or rales.  Abdominal:     General: Bowel sounds are normal.     Palpations: Abdomen is soft. There is no mass.     Tenderness: There is no abdominal tenderness. There is no right CVA tenderness, left CVA tenderness, guarding or rebound.  Musculoskeletal:        General: No tenderness. Normal range of motion.     Cervical back: Normal range of motion and neck supple.  Lymphadenopathy:     Cervical: No cervical adenopathy.  Skin:    General: Skin is warm.     Capillary Refill: Capillary refill takes less than 2 seconds.     Findings: No rash.  Neurological:     Mental Status: She is  alert and oriented to person, place, and time.     Cranial Nerves: No cranial nerve deficit.     Deep Tendon Reflexes: Reflexes normal.  Psychiatric:        Mood and Affect: Mood is not anxious or depressed.     Wt Readings from Last 3 Encounters:  08/01/19 216 lb (98 kg)  07/05/19 217 lb 4.8 oz (98.6 kg)  06/20/19 219 lb 11 oz (99.6 kg)    BP 130/80   Pulse 80   Ht 5\' 9"  (1.753 m)   Wt 216 lb (98 kg)   BMI 31.90 kg/m   Assessment and Plan:  1. Mixed hyperlipidemia Chronic.  Controlled.  Stable.  Continue atorvastatin 10 mg once a day.  Will check lipid panel for baseline values LDL triglycerides - atorvastatin (LIPITOR) 10 MG tablet; Take 1 tablet (10 mg total) by mouth daily.  Dispense: 90 tablet; Refill: 1 - Lipid Panel With LDL/HDL Ratio  2. Anxiety Chronic.  Controlled.  Stable.  Patient doing relatively well given her history of lung cancer and vascular disease.  We will continue buspirone 5 mg daily along with the occasional alprazolam as needed panic attack. - busPIRone (BUSPAR) 5 MG tablet; Take 1 tablet (5 mg total) by mouth daily.  Dispense: 90 tablet; Refill: 1 - ALPRAZolam (XANAX) 0.25 MG tablet; Take 1 tablet (0.25 mg total) by mouth at bedtime as needed. for anxiety  Dispense: 30 tablet; Refill: 5  3. PVD (peripheral vascular disease) (HCC) Chronic.  Controlled.  Relatively stable at this time.  Followed with Dr. Lazaro Arms in which it was noted that patient had blue toe syndrome of the right extremity but has been noted to have significant for for vascular disease.  Patient is beginning to have some claudication-like symptoms suggesting worsening of conditions for which Dr. Lucky Cowboy suggested that stenting may be the next level.  Patient will be checking in with [facility] primary care and will likely need to advance to vascular evaluation with [facility] vascular studies if [facility] does not participate in her insurance.  4. Mild episode of recurrent major depressive  disorder (HCC) Chronic.  Controlled.  Stable.  Continue Prozac 10 mg once a day. - FLUoxetine (PROZAC) 10 MG capsule; Take 1 capsule (10 mg total) by mouth daily.  Dispense: 90 capsule; Refill: 1  5. Neurodermatitis, localized Chronic.  Controlled.  Stable.  Particularly bothersome  at night and is controlled with hydroxyzine 10 mg nightly. - hydrOXYzine (ATARAX/VISTARIL) 10 MG tablet; Take 1 tablet (10 mg total) by mouth daily.  Dispense: 30 tablet; Refill: 5  6. Hypothyroidism, unspecified type Chronic.  Controlled.  Stable.  Will check TSH at a later date in the meantime we will continue levothyroxine 112 mcg daily. - levothyroxine (SYNTHROID) 112 MCG tablet; Take 1 tablet (112 mcg total) by mouth daily.  Dispense: 90 tablet; Refill: 1  7. Essential hypertension Chronic.  Controlled.  Stable.  Continue lisinopril hydrochlorothiazide 10-12.5 mg will check CMP.  Evaluate current GFR. - lisinopril-hydrochlorothiazide (ZESTORETIC) 10-12.5 MG tablet; Take 1 tablet by mouth daily.  Dispense: 90 tablet; Refill: 1 - Comprehensive Metabolic Panel (CMET)  8. Taking multiple medications for chronic disease Chronic.  This is for control of vascular disease with PVD and it is also noted that patient had coronary vascular disease as well we will check CBC to assess for any thrombocytopenia. - CBC w/Diff/Platelet  9.Aortic Atherosclerosis patient is noted to have atherosclerosis of the aorta on CT.  There is also noted to have some calcification in the circumflex.  This will be treated with control of the patient's cholesterol status as well as reducing blood pressure and antiplatelet therapy with Plavix.

## 2019-08-02 ENCOUNTER — Telehealth: Payer: Self-pay

## 2019-08-02 NOTE — Telephone Encounter (Signed)
Spoke with the patient due to having question why do she need a abdomen and pelvis scan. Per Dr Mike Gip I informed the patient that she wanted to make sure there is no other growth any where else and to keep you in a health state. If abdomen and pelvis scans are not done how do we know that there is no growth any where else. I informed the patient that Dr Mike Gip is only taking caution with her health to make sure she maintain a health state. The patient was understanding and agreeable.

## 2019-08-03 ENCOUNTER — Emergency Department: Payer: BLUE CROSS/BLUE SHIELD

## 2019-08-03 ENCOUNTER — Encounter: Payer: Self-pay | Admitting: Family Medicine

## 2019-08-03 ENCOUNTER — Other Ambulatory Visit: Payer: Self-pay

## 2019-08-03 ENCOUNTER — Emergency Department
Admission: EM | Admit: 2019-08-03 | Discharge: 2019-08-03 | Disposition: A | Discharge status: Home / Self Care | Payer: BLUE CROSS/BLUE SHIELD | Attending: Emergency Medicine | Admitting: Emergency Medicine

## 2019-08-03 ENCOUNTER — Ambulatory Visit: Payer: BLUE CROSS/BLUE SHIELD | Admitting: Family Medicine

## 2019-08-03 VITALS — BP 160/80 | HR 88 | Ht 69.0 in | Wt 214.0 lb

## 2019-08-03 DIAGNOSIS — C3490 Malignant neoplasm of unspecified part of unspecified bronchus or lung: Secondary | ICD-10-CM | POA: Diagnosis not present

## 2019-08-03 DIAGNOSIS — J449 Chronic obstructive pulmonary disease, unspecified: Secondary | ICD-10-CM | POA: Diagnosis not present

## 2019-08-03 DIAGNOSIS — Z87891 Personal history of nicotine dependence: Secondary | ICD-10-CM | POA: Diagnosis not present

## 2019-08-03 DIAGNOSIS — I75021 Atheroembolism of right lower extremity: Secondary | ICD-10-CM | POA: Diagnosis not present

## 2019-08-03 DIAGNOSIS — R350 Frequency of micturition: Secondary | ICD-10-CM

## 2019-08-03 DIAGNOSIS — R791 Abnormal coagulation profile: Secondary | ICD-10-CM | POA: Diagnosis not present

## 2019-08-03 DIAGNOSIS — Z7982 Long term (current) use of aspirin: Secondary | ICD-10-CM | POA: Diagnosis not present

## 2019-08-03 DIAGNOSIS — G459 Transient cerebral ischemic attack, unspecified: Secondary | ICD-10-CM

## 2019-08-03 DIAGNOSIS — G936 Cerebral edema: Secondary | ICD-10-CM | POA: Insufficient documentation

## 2019-08-03 DIAGNOSIS — R29810 Facial weakness: Secondary | ICD-10-CM | POA: Diagnosis not present

## 2019-08-03 DIAGNOSIS — Z7901 Long term (current) use of anticoagulants: Secondary | ICD-10-CM | POA: Diagnosis not present

## 2019-08-03 DIAGNOSIS — I10 Essential (primary) hypertension: Secondary | ICD-10-CM | POA: Insufficient documentation

## 2019-08-03 DIAGNOSIS — Z79899 Other long term (current) drug therapy: Secondary | ICD-10-CM | POA: Diagnosis not present

## 2019-08-03 DIAGNOSIS — I499 Cardiac arrhythmia, unspecified: Secondary | ICD-10-CM | POA: Diagnosis not present

## 2019-08-03 DIAGNOSIS — C7800 Secondary malignant neoplasm of unspecified lung: Secondary | ICD-10-CM

## 2019-08-03 DIAGNOSIS — R4701 Aphasia: Secondary | ICD-10-CM | POA: Diagnosis present

## 2019-08-03 DIAGNOSIS — C801 Malignant (primary) neoplasm, unspecified: Secondary | ICD-10-CM

## 2019-08-03 LAB — COMPREHENSIVE METABOLIC PANEL
ALT: 12 IU/L (ref 0–32)
ALT: 16 U/L (ref 0–44)
AST: 15 IU/L (ref 0–40)
AST: 17 U/L (ref 15–41)
Albumin/Globulin Ratio: 1.5 (ref 1.2–2.2)
Albumin: 4.1 g/dL (ref 3.8–4.8)
Albumin: 4 g/dL (ref 3.5–5.0)
Alkaline Phosphatase: 93 IU/L (ref 39–117)
Alkaline Phosphatase: 79 U/L (ref 38–126)
Anion gap: 11 (ref 5–15)
BUN/Creatinine Ratio: 13 (ref 12–28)
BUN: 13 mg/dL (ref 8–27)
BUN: 11 mg/dL (ref 8–23)
Bilirubin Total: 0.3 mg/dL (ref 0.0–1.2)
CO2: 24 mmol/L (ref 20–29)
CO2: 25 mmol/L (ref 22–32)
Calcium: 9.3 mg/dL (ref 8.7–10.3)
Calcium: 9.4 mg/dL (ref 8.9–10.3)
Chloride: 104 mmol/L (ref 96–106)
Chloride: 104 mmol/L (ref 98–111)
Creatinine, Ser: 1.01 mg/dL — ABNORMAL HIGH (ref 0.57–1.00)
Creatinine, Ser: 0.97 mg/dL (ref 0.44–1.00)
GFR calc Af Amer: 69 mL/min/{1.73_m2} (ref >59)
GFR calc Af Amer: >60 mL/min (ref >60)
GFR calc non Af Amer: 60 mL/min/{1.73_m2} (ref >59)
GFR calc non Af Amer: >60 mL/min (ref >60)
Globulin, Total: 2.8 g/dL (ref 1.5–4.5)
Glucose, Bld: 122 mg/dL — ABNORMAL HIGH (ref 70–99)
Glucose: 90 mg/dL (ref 65–99)
Potassium: 4.5 mmol/L (ref 3.5–5.2)
Potassium: 4 mmol/L (ref 3.5–5.1)
Sodium: 140 mmol/L (ref 134–144)
Sodium: 140 mmol/L (ref 135–145)
Total Bilirubin: 0.9 mg/dL (ref 0.3–1.2)
Total Protein: 6.9 g/dL (ref 6.0–8.5)
Total Protein: 7.6 g/dL (ref 6.5–8.1)

## 2019-08-03 LAB — CBC
HCT: 42.2 % (ref 36.0–46.0)
Hemoglobin: 13.8 g/dL (ref 12.0–15.0)
MCH: 30.3 pg (ref 26.0–34.0)
MCHC: 32.7 g/dL (ref 30.0–36.0)
MCV: 92.7 fL (ref 80.0–100.0)
Platelets: 277 10*3/uL (ref 150–400)
RBC: 4.55 MIL/uL (ref 3.87–5.11)
RDW: 12.4 % (ref 11.5–15.5)
WBC: 6.8 10*3/uL (ref 4.0–10.5)
nRBC: 0 % (ref 0.0–0.2)

## 2019-08-03 LAB — CBC WITH DIFFERENTIAL/PLATELET
Basophils Absolute: 0 10*3/uL (ref 0.0–0.2)
Basos: 1 %
EOS (ABSOLUTE): 0.1 10*3/uL (ref 0.0–0.4)
Eos: 1 %
Hematocrit: 38.9 % (ref 34.0–46.6)
Hemoglobin: 13.4 g/dL (ref 11.1–15.9)
Immature Grans (Abs): 0 10*3/uL (ref 0.0–0.1)
Immature Granulocytes: 0 %
Lymphocytes Absolute: 2 10*3/uL (ref 0.7–3.1)
Lymphs: 34 %
MCH: 30.9 pg (ref 26.6–33.0)
MCHC: 34.4 g/dL (ref 31.5–35.7)
MCV: 90 fL (ref 79–97)
Monocytes Absolute: 0.4 10*3/uL (ref 0.1–0.9)
Monocytes: 7 %
Neutrophils Absolute: 3.3 10*3/uL (ref 1.4–7.0)
Neutrophils: 57 %
Platelets: 278 10*3/uL (ref 150–450)
RBC: 4.34 x10E6/uL (ref 3.77–5.28)
RDW: 11.9 % (ref 11.7–15.4)
WBC: 5.8 10*3/uL (ref 3.4–10.8)

## 2019-08-03 LAB — DIFFERENTIAL
Abs Immature Granulocytes: 0.03 10*3/uL (ref 0.00–0.07)
Basophils Absolute: 0 10*3/uL (ref 0.0–0.1)
Basophils Relative: 0 %
Eosinophils Absolute: 0 10*3/uL (ref 0.0–0.5)
Eosinophils Relative: 0 %
Immature Granulocytes: 0 %
Lymphocytes Relative: 23 %
Lymphs Abs: 1.6 10*3/uL (ref 0.7–4.0)
Monocytes Absolute: 0.4 10*3/uL (ref 0.1–1.0)
Monocytes Relative: 5 %
Neutro Abs: 4.8 10*3/uL (ref 1.7–7.7)
Neutrophils Relative %: 72 %

## 2019-08-03 LAB — POCT URINALYSIS DIPSTICK
Bilirubin, UA: NEGATIVE
Blood, UA: NEGATIVE
Glucose, UA: NEGATIVE
Ketones, UA: NEGATIVE
Leukocytes, UA: NEGATIVE
Nitrite, UA: NEGATIVE
Protein, UA: NEGATIVE
Spec Grav, UA: 1.02 (ref 1.010–1.025)
Urobilinogen, UA: 0.2 E.U./dL
pH, UA: 6 (ref 5.0–8.0)

## 2019-08-03 LAB — GLUCOSE, CAPILLARY
Glucose-Capillary: 109 mg/dL — ABNORMAL HIGH (ref 70–99)
Glucose-Capillary: 132 mg/dL — ABNORMAL HIGH (ref 70–99)

## 2019-08-03 LAB — LIPID PANEL WITH LDL/HDL RATIO
Cholesterol, Total: 150 mg/dL (ref 100–199)
HDL: 45 mg/dL (ref >39)
LDL Chol Calc (NIH): 84 mg/dL (ref 0–99)
LDL/HDL Ratio: 1.9 ratio (ref 0.0–3.2)
Triglycerides: 119 mg/dL (ref 0–149)
VLDL Cholesterol Cal: 21 mg/dL (ref 5–40)

## 2019-08-03 LAB — PROTIME-INR
INR: 1 (ref 0.8–1.2)
Prothrombin Time: 13.4 seconds (ref 11.4–15.2)

## 2019-08-03 LAB — APTT: aPTT: 32 seconds (ref 24–36)

## 2019-08-03 MED ORDER — DEXAMETHASONE SODIUM PHOSPHATE 10 MG/ML IJ SOLN
10.0000 mg | Freq: Once | INTRAMUSCULAR | Status: AC
  Administered 2019-08-03: 10 mg via INTRAVENOUS
  Filled 2019-08-03: qty 1

## 2019-08-03 MED ORDER — DEXAMETHASONE 4 MG PO TABS: 4.0000 mg | ORAL_TABLET | Freq: Two times a day (BID) | ORAL | 0 refills | Status: DC

## 2019-08-03 MED ORDER — GADOBUTROL 1 MMOL/ML IV SOLN
9.0000 mL | Freq: Once | INTRAVENOUS | Status: AC | PRN
  Administered 2019-08-03: 9 mL via INTRAVENOUS

## 2019-08-03 MED ORDER — LEVETIRACETAM IN NACL 1000 MG/100ML IV SOLN
1000.0000 mg | Freq: Once | INTRAVENOUS | Status: AC
  Administered 2019-08-03: 1000 mg via INTRAVENOUS
  Filled 2019-08-03: qty 100

## 2019-08-03 MED ORDER — LEVETIRACETAM 500 MG PO TABS: 500.0000 mg | ORAL_TABLET | Freq: Two times a day (BID) | ORAL | 2 refills | Status: DC

## 2019-08-03 NOTE — ED Notes (Signed)
Pt had a stroke like episode yesterday at approx. 1130. Pt had numbness on lt side of mouth and left hand was shaking. Pt was unable to communicate during episode. EMS came out and advised to come to ED. Pt went home and followed up w/ her PCP and was sent to this ED today.

## 2019-08-03 NOTE — ED Triage Notes (Signed)
Pt comes POV from doctor's office. Yesterday around 1130 pt had numbness on the left side of mouth and arm and was unable to talk for about 5 mins. EMS saw her and wanted her to follow up with ER but pt didn't want to come. PCP sent her here after being seen this morning. Currently, pt is neurologically intact except a small droop to the left side of her face.

## 2019-08-03 NOTE — Consult Note (Signed)
Referring Physician:  No referring provider defined for this encounter.  Primary Physician:  Juline Patch, MD  Chief Complaint:  Newly found R frontal mass  History of Present Illness: 08/03/2019 Kristina Wright is a 64 y.o. female who presents with the chief complaint of possible seizure yesterday.  She reports that her son told her her voice sounded strange, and then her L arm was shaky.  No LOC or loss of B/B control.  She now feels relatively normal.  Denies HA/N/V.  She reports her cognition is baseline, and her speech is normal.  She has no current complaints.    Review of Systems:  A 10 point review of systems is negative, except for the pertinent positives and negatives detailed in the HPI.  Past Medical History: Past Medical History:  Diagnosis Date  . Cancer ([place]) 1989   cervical  . Depression   . Family history of breast cancer   . Family history of colon cancer   . Family history of ovarian cancer   . Family history of stomach cancer   . Family history of throat cancer   . Hyperlipidemia   . Hypertension   . Malignant neoplasm of lung ([place]) 03/01/2018  . Thyroid disease     Past Surgical History: Past Surgical History:  Procedure Laterality Date  . BILATERAL CARPAL TUNNEL RELEASE    . BREAST CYST ASPIRATION Left   . LUNG BIOPSY    . PARTIAL HYSTERECTOMY    . PORTA CATH INSERTION N/A 03/03/2018   Procedure: PORTA CATH INSERTION;  Surgeon: Katha Cabal, MD;  Location: [facility];  Service: Cardiovascular;  Laterality: N/A;  . TUBAL LIGATION      Allergies: Allergies as of 08/03/2019  . (No Known Allergies)    Medications: No current facility-administered medications for this encounter.  Current Outpatient Medications:  .  ALPRAZolam (XANAX) 0.25 MG tablet, Take 1 tablet (0.25 mg total) by mouth at bedtime as needed. for anxiety, Disp: 30 tablet, Rfl: 5 .  aspirin EC 81 MG tablet, Take 81 mg by mouth daily., Disp: , Rfl:  .   atorvastatin (LIPITOR) 10 MG tablet, Take 1 tablet (10 mg total) by mouth daily., Disp: 90 tablet, Rfl: 1 .  busPIRone (BUSPAR) 5 MG tablet, Take 1 tablet (5 mg total) by mouth daily., Disp: 90 tablet, Rfl: 1 .  clopidogrel (PLAVIX) 75 MG tablet, Take 1 tablet (75 mg total) by mouth daily., Disp: 90 tablet, Rfl: 1 .  FLUoxetine (PROZAC) 10 MG capsule, Take 1 capsule (10 mg total) by mouth daily., Disp: 90 capsule, Rfl: 1 .  hydrOXYzine (ATARAX/VISTARIL) 10 MG tablet, Take 1 tablet (10 mg total) by mouth daily., Disp: 30 tablet, Rfl: 5 .  levothyroxine (SYNTHROID) 112 MCG tablet, Take 1 tablet (112 mcg total) by mouth daily., Disp: 90 tablet, Rfl: 1 .  lisinopril-hydrochlorothiazide (ZESTORETIC) 10-12.5 MG tablet, Take 1 tablet by mouth daily., Disp: 90 tablet, Rfl: 1 .  meclizine (ANTIVERT) 25 MG tablet, Take 1 tablet (25 mg total) by mouth 3 (three) times daily as needed for dizziness. (Patient not taking: Reported on 08/01/2019), Disp: 30 tablet, Rfl: 0 .  Multiple Vitamins-Minerals (MULTIVITAMIN ADULT PO), Take 1 tablet by mouth daily. , Disp: , Rfl:  .  nystatin cream (MYCOSTATIN), Apply 1 application topically 2 (two) times daily., Disp: 30 g, Rfl: 0 .  olopatadine (PATANOL) 0.1 % ophthalmic solution, Place 1 drop into both eyes 2 (two) times daily., Disp: 5 mL,  Rfl: 12 .  TAGRISSO 80 MG tablet, TAKE 1 TABLET (80 MG TOTAL) BY MOUTH DAILY., Disp: 30 tablet, Rfl: 2 .  triamcinolone cream (KENALOG) 0.1 %, Apply 1 application topically 2 (two) times daily., Disp: 30 g, Rfl: 1 .  umeclidinium-vilanterol (ANORO ELLIPTA) 62.5-25 MCG/INH AEPB, Inhale 1 puff into the lungs daily., Disp: 60 each, Rfl: 5 .  VENTOLIN HFA 108 (90 Base) MCG/ACT inhaler, Inhale 1-2 puffs into the lungs every 6 (six) hours as needed for wheezing or shortness of breath., Disp: 18 g, Rfl: 1   Social History: Social History   Tobacco Use  . Smoking status: Former Smoker    Packs/day: 1.00    Years: 25.00    Pack years:  25.00    Types: Cigarettes    Quit date: 11/19/2017    Years since quitting: 1.7  . Smokeless tobacco: Never Used  Substance Use Topics  . Alcohol use: No  . Drug use: Yes    Types: Marijuana    Comment: occasional    Family Medical History: Family History  Problem Relation Age of Onset  . Colon cancer Mother 57  . Colon cancer Maternal Grandmother 78  . Hypertension Father   . Breast cancer Sister 34  . Throat cancer Brother   . Heart attack Maternal Aunt   . Ovarian cancer Maternal Aunt 70  . Stomach cancer Paternal Grandmother        dx >50  . Throat cancer Brother     Physical Examination: Vitals:   08/03/19 1239 08/03/19 1500  BP: (!) 162/73 (!) 179/88  Pulse: 86 77  Resp: 18 17  Temp: 98.6 F (37 C)   SpO2: 98% 98%     General: Patient is well developed, well nourished, calm, collected, and in no apparent distress.  Psychiatric: Patient is non-anxious.  Head:  Pupils equal, round, and reactive to light.  ENT:  Oral mucosa appears well hydrated.  Neck:   Supple.  Full range of motion.  Respiratory: Patient is breathing without any difficulty.  Extremities: No edema.  Vascular: Palpable pulses in dorsal pedal vessels.  Skin:   On exposed skin, there are no abnormal skin lesions.  NEUROLOGICAL:  General: In no acute distress.   Awake, alert, oriented to person, place, and time.  Pupils equal round and reactive to light.  Facial tone shows slight L facial droop.  Tongue protrusion is midline.  There is no pronator drift.   Strength: Side Biceps Triceps Deltoid Interossei Grip Wrist Ext. Wrist Flex.  R 5 5 5 5 5 5 5   L 5 5 5 5 5 5 5    Side Iliopsoas Quads Hamstring PF DF EHL  R 5 5 5 5 5 5   L 5 5 5 5 5 5    Reflexes are 1+ and symmetric at the biceps, triceps, brachioradialis, patella and achilles.   Bilateral upper and lower extremity sensation is intact to light touch and pin prick.  Clonus is not present.  Toes are down-going.  Gait is normal.   Hoffman's is absent.  Imaging: CT Head 08/03/2019 IMPRESSION: Peripherally hyperdense 2.5 cm right frontal lesion is concerning for hemorrhagic metastases versus primary neoplasm. Further evaluation with MRI brain with and without contrast is recommended.  Right frontal perilesional edema involving the right basal ganglia and thalamus.  Partial right lateral ventricle effacement with 4 mm leftward midline shift.  These results were called by telephone at the time of interpretation on 08/03/2019 at 1:22 pm to provider  Blake Divine , who verbally acknowledged these results.   Electronically Signed   By: Primitivo Gauze M.D.   On: 08/03/2019 13:26 I have personally reviewed the images and agree with the above interpretation.  Labs: CBC Latest Ref Rng & Units 08/03/2019 08/02/2019 06/20/2019  WBC 4.0 - 10.5 K/uL 6.8 5.8 5.9  Hemoglobin 12.0 - 15.0 g/dL 13.8 13.4 12.8  Hematocrit 36.0 - 46.0 % 42.2 38.9 39.3  Platelets 150 - 400 K/uL 277 278 233       Assessment and Plan: Ms. Brimley is a pleasant 64 y.o. female with newly diagnosed R frontal mass, consisted with metastatic disease versus primary glial neoplasm.  Given underlying diagnosis of lung cancer, metastasis is favored.  - Agree with dexamethasone 10mg  now, then 4mg  q6 hrs - Keppra 1 gm load, then 500 mg BID - MRI brain - further decision based on MRI  She will definitely need some type of radiation, but surgery versus SRS is not yet clear.  After MRI, Drs. Corcoran, Chrystal, and I will review to make a recommendation regarding SRS vs other XRT vs surgery and XRT.  Unfortunately, due to her likely seizure, I advised her that she cannot drive until she is seizure free for 6 months.  She expressed understanding.   Ejay Lashley K. Izora Ribas MD, [facility]

## 2019-08-03 NOTE — ED Notes (Signed)
Pt trx to MRI

## 2019-08-03 NOTE — Discharge Instructions (Addendum)
Please begin taking your Decadron/steroid twice daily as prescribed as well as your Keppra (seizure medication) twice daily as prescribed.  Please do not drive until you have been cleared by your doctor.  Return to the emergency department for any further seizure-like activity, or any other symptom personally concerning to yourself.

## 2019-08-03 NOTE — ED Provider Notes (Signed)
[facility] Emergency Department Provider Note  ____________________________________________   First MD Initiated Contact with Patient 08/03/19 1333     (approximate)  I have reviewed the triage vital signs and the nursing notes.   HISTORY  Chief Complaint Transient Ischemic Attack    HPI Kristina Wright is a 64 y.o. female with history of lung cancer on immunotherapy who comes in with neurological episode that happened yesterday.  Patient stated that yesterday she had about 5 minutes where she felt like her left arm was jerking and she was unable to speak.  This was constant, unclear what brought it on, got better on its own.  Went away after 5 minutes.  She is at her baseline although does have a little bit of facial asymmetry that she noted she was told to go to the ER but was not evaluated yesterday.  Saw her primary care doctor today who wanted her to come in to be evaluated for concern for possible stroke versus metastatic disease          Past Medical History:  Diagnosis Date  . Cancer ([place]) 1989   cervical  . Depression   . Family history of breast cancer   . Family history of colon cancer   . Family history of ovarian cancer   . Family history of stomach cancer   . Family history of throat cancer   . Hyperlipidemia   . Hypertension   . Malignant neoplasm of lung ([place]) 03/01/2018  . Thyroid disease     Patient Active Problem List   Diagnosis Date Noted  . Elevated TSH 06/20/2019  . Chronic obstructive pulmonary disease ([place]) 12/13/2018  . Left lower lobe pulmonary nodule 11/29/2018  . Breast nodule 08/25/2018  . Anxiety 08/22/2018  . Genetic testing 06/11/2018  . Family history of breast cancer   . Family history of colon cancer   . Family history of ovarian cancer   . Family history of stomach cancer   . Family history of throat cancer   . Metastatic adenocarcinoma to lung with unknown primary site [facility]) 04/27/2018  . Depression  03/12/2018  . Malignant neoplasm of lung ([place]) 03/01/2018  . Goals of care, counseling/discussion 03/01/2018  . Tobacco use disorder 02/08/2016  . Essential hypertension 02/08/2016  . PVD (peripheral vascular disease) ([place]) 02/08/2016  . Blue toe syndrome of right lower extremity ([place]) 02/08/2016  . Adult BMI > 30 07/31/2015  . High risk medication use 07/31/2015  . Carpal tunnel syndrome on both sides 10/30/2014  . Postmenopause atrophic vaginitis 05/15/2014  . Sensory urge incontinence 05/15/2014  . Vitamin D deficiency 04/25/2014  . Extremity pain 11/22/2013  . Numbness 11/22/2013  . Sleep disorder 11/22/2013  . Chronic insomnia 11/10/2013  . Foot pain, left 11/10/2013  . Hypothyroidism 11/10/2013  . Mixed hyperlipidemia 11/10/2013    Past Surgical History:  Procedure Laterality Date  . BILATERAL CARPAL TUNNEL RELEASE    . BREAST CYST ASPIRATION Left   . LUNG BIOPSY    . PARTIAL HYSTERECTOMY    . PORTA CATH INSERTION N/A 03/03/2018   Procedure: PORTA CATH INSERTION;  Surgeon: Katha Cabal, MD;  Location: [facility];  Service: Cardiovascular;  Laterality: N/A;  . TUBAL LIGATION      Prior to Admission medications   Medication Sig Start Date End Date Taking? Authorizing Provider  ALPRAZolam (XANAX) 0.25 MG tablet Take 1 tablet (0.25 mg total) by mouth at bedtime as needed. for anxiety 08/01/19  Juline Patch, MD  aspirin EC 81 MG tablet Take 81 mg by mouth daily.    [provider]  atorvastatin (LIPITOR) 10 MG tablet Take 1 tablet (10 mg total) by mouth daily. 08/01/19   Juline Patch, MD  busPIRone (BUSPAR) 5 MG tablet Take 1 tablet (5 mg total) by mouth daily. 08/01/19   Juline Patch, MD  clopidogrel (PLAVIX) 75 MG tablet Take 1 tablet (75 mg total) by mouth daily. 03/03/19   Juline Patch, MD  FLUoxetine (PROZAC) 10 MG capsule Take 1 capsule (10 mg total) by mouth daily. 08/01/19   Juline Patch, MD  hydrOXYzine (ATARAX/VISTARIL) 10 MG  tablet Take 1 tablet (10 mg total) by mouth daily. 08/01/19   Juline Patch, MD  levothyroxine (SYNTHROID) 112 MCG tablet Take 1 tablet (112 mcg total) by mouth daily. 08/01/19   Juline Patch, MD  lisinopril-hydrochlorothiazide (ZESTORETIC) 10-12.5 MG tablet Take 1 tablet by mouth daily. 08/01/19   Juline Patch, MD  meclizine (ANTIVERT) 25 MG tablet Take 1 tablet (25 mg total) by mouth 3 (three) times daily as needed for dizziness. Patient not taking: Reported on 08/01/2019 08/16/18   Juline Patch, MD  Multiple Vitamins-Minerals (MULTIVITAMIN ADULT PO) Take 1 tablet by mouth daily.     [provider]  nystatin cream (MYCOSTATIN) Apply 1 application topically 2 (two) times daily. 03/03/19   Juline Patch, MD  olopatadine (PATANOL) 0.1 % ophthalmic solution Place 1 drop into both eyes 2 (two) times daily. 12/13/18   Juline Patch, MD  TAGRISSO 80 MG tablet TAKE 1 TABLET (80 MG TOTAL) BY MOUTH DAILY. 06/08/19   Lequita Asal, MD  triamcinolone cream (KENALOG) 0.1 % Apply 1 application topically 2 (two) times daily. 12/13/18   Juline Patch, MD  umeclidinium-vilanterol (ANORO ELLIPTA) 62.5-25 MCG/INH AEPB Inhale 1 puff into the lungs daily. 08/01/19   Juline Patch, MD  VENTOLIN HFA 108 (90 Base) MCG/ACT inhaler Inhale 1-2 puffs into the lungs every 6 (six) hours as needed for wheezing or shortness of breath. 08/01/19   Juline Patch, MD  prochlorperazine (COMPAZINE) 10 MG tablet Take 1 tablet (10 mg total) by mouth every 6 (six) hours as needed (Nausea or vomiting). Patient not taking: Reported on 03/03/2019 03/01/18 04/25/19  Earlie Server, MD    Allergies Patient has no known allergies.  Family History  Problem Relation Age of Onset  . Colon cancer Mother 61  . Colon cancer Maternal Grandmother 78  . Hypertension Father   . Breast cancer Sister 71  . Throat cancer Brother   . Heart attack Maternal Aunt   . Ovarian cancer Maternal Aunt 70  . Stomach cancer Paternal  Grandmother        dx >50  . Throat cancer Brother     Social History Social History   Tobacco Use  . Smoking status: Former Smoker    Packs/day: 1.00    Years: 25.00    Pack years: 25.00    Types: Cigarettes    Quit date: 11/19/2017    Years since quitting: 1.7  . Smokeless tobacco: Never Used  Substance Use Topics  . Alcohol use: No  . Drug use: Yes    Types: Marijuana    Comment: occasional      Review of Systems Constitutional: No fever/chills Eyes: No visual changes. ENT: No sore throat. Cardiovascular: Denies chest pain. Respiratory: Denies shortness of breath. Gastrointestinal: No abdominal pain.  No nausea, no vomiting.  No diarrhea.  No constipation. Genitourinary: Negative for dysuria. Musculoskeletal: Negative for back pain. Skin: Negative for rash. Neurological: Negative for headaches, focal weakness or numbness. All other ROS negative ____________________________________________   PHYSICAL EXAM:  VITAL SIGNS: ED Triage Vitals [08/03/19 1239]  Enc Vitals Group     BP (!) 162/73     Pulse Rate 86     Resp 18     Temp 98.6 F (37 C)     Temp Source Oral     SpO2 98 %     Weight 213 lb 13.5 oz (97 kg)     Height 5' 9"  (1.753 m)     Head Circumference      Peak Flow      Pain Score 0     Pain Loc      Pain Edu?      Excl. in [place]?     Constitutional: Alert and oriented. Well appearing and in no acute distress. Eyes: Conjunctivae are normal. EOMI. Head: Atraumatic. Nose: No congestion/rhinnorhea. Mouth/Throat: Mucous membranes are moist.   Neck: No stridor. Trachea Midline. FROM Cardiovascular: Normal rate, regular rhythm. Grossly normal heart sounds.  Good peripheral circulation. Respiratory: Normal respiratory effort.  No retractions. Lungs CTAB. Gastrointestinal: Soft and nontender. No distention. No abdominal bruits.  Musculoskeletal: No lower extremity tenderness nor edema.  No joint effusions. Neurologic: Slight facial asymmetry with a  little bit of droop on the left, otherwise neuro intact.  Equal strength in her arms and legs with sensation intact Skin:  Skin is warm, dry and intact. No rash noted. Psychiatric: Mood and affect are normal. Speech and behavior are normal. GU: Deferred   ____________________________________________   LABS (all labs ordered are listed, but only abnormal results are displayed)  Labs Reviewed  COMPREHENSIVE METABOLIC PANEL - Abnormal; Notable for the following components:      Result Value   Glucose, Bld 122 (*)    All other components within normal limits  GLUCOSE, CAPILLARY - Abnormal; Notable for the following components:   Glucose-Capillary 109 (*)    All other components within normal limits  PROTIME-INR  APTT  CBC  DIFFERENTIAL  I-STAT CREATININE, ED  CBG MONITORING, ED   ____________________________________________   ED ECG REPORT I, Vanessa Scarsdale, the attending physician, personally viewed and interpreted this ECG.  EKG is sinus rate of 84, no ST elevation, no T wave inversions, normal intervals, occasional PVC ____________________________________________  RADIOLOGY [name] Bellow, personally viewed and evaluated these images (plain radiographs) as part of my medical decision making, as well as reviewing the written report by the radiologist.  ED MD interpretation: Irregularity noted concerning for possible met with edema and midline shift.  Official radiology report(s): CT HEAD WO CONTRAST  Result Date: 08/03/2019 CLINICAL DATA:  Transit ischemic attack. EXAM: CT HEAD WITHOUT CONTRAST TECHNIQUE: Contiguous axial images were obtained from the base of the skull through the vertex without intravenous contrast. COMPARISON:  03/10/2018 MRI head. FINDINGS: Brain: There is a peripherally hyperdense 2.0 x 1.9 x 2.5 cm right frontal lesion (2:18, 4:25), new since prior exam. New right frontal/perilesional vasogenic edema likely involving the right basal ganglia, posterior limb  of the right internal capsule and thalamus. Partial effacement of the right lateral ventricle with 4 mm leftward midline shift (2:17). No ventriculomegaly. No extra-axial fluid collection. Vascular: No hyperdense vessel. Bilateral carotid siphon atherosclerotic calcifications. Skull: Negative for fracture or focal lesion. Sinuses/Orbits: Normal orbits. Clear paranasal sinuses.  No mastoid effusion. Other: None. IMPRESSION: Peripherally hyperdense 2.5 cm right frontal lesion is concerning for hemorrhagic metastases versus primary neoplasm. Further evaluation with MRI brain with and without contrast is recommended. Right frontal perilesional edema involving the right basal ganglia and thalamus. Partial right lateral ventricle effacement with 4 mm leftward midline shift. These results were called by telephone at the time of interpretation on 08/03/2019 at 1:22 pm to provider Blake Divine , who verbally acknowledged these results. Electronically Signed   By: Primitivo Gauze M.D.   On: 08/03/2019 13:26    ____________________________________________   PROCEDURES  Procedure(s) performed (including Critical Care):  .Critical Care Performed by: Vanessa Keddie, MD Authorized by: Vanessa Manor, MD   Critical care provider statement:    Critical care time (minutes):  35   Critical care was necessary to treat or prevent imminent or life-threatening deterioration of the following conditions:  CNS failure or compromise   Critical care was time spent personally by me on the following activities:  Discussions with consultants, evaluation of patient's response to treatment, examination of patient, ordering and performing treatments and interventions, ordering and review of laboratory studies, ordering and review of radiographic studies, pulse oximetry, re-evaluation of patient's condition, obtaining history from patient or surrogate and review of old  charts     ____________________________________________   INITIAL IMPRESSION / [place] / ED COURSE  KINLIE JANICE was evaluated in Emergency Department on 08/03/2019 for the symptoms described in the history of present illness. She was evaluated in the context of the global COVID-19 pandemic, which necessitated consideration that the patient might be at risk for infection with the SARS-CoV-2 virus that causes COVID-19. Institutional protocols and algorithms that pertain to the evaluation of patients at risk for COVID-19 are in a state of rapid change based on information released by regulatory bodies including the CDC and federal and state organizations. These policies and algorithms were followed during the patient's care in the ED.    Patient is a 64 year old with lung cancer who comes in with neurological event that happened yesterday.  Sounds concerning for possible partial seizure versus stroke.  Will get CT head evaluate for stroke, hemorrhage, metastatic disease.  Will get labs to evaluate for electrolyte abnormalities, AKI  CT scan concerning for hyperdense frontal lesion concerning for hemorrhagic met versus primary neoplasm there is some associated edema with 4 mm of midline shift.  Will discuss with neurosurgery for further recommendations  D/w Dr. Izora Ribas who wanted to start off with MRI with and without contrast because of the chest that she can stay here and get radiation treatment.  Dr. Mike Gip is also aware of the MRI results her oncologist and agrees with this plan.  We will give a dose of IV Decadron due to the swelling in the brain as well as a dose of IV Keppra due to concern patient may have had a partial seizure.  Patient had off to oncoming team pending MRI and consultation with the above to decide on final disposition      ____________________________   FINAL CLINICAL IMPRESSION(S) / ED DIAGNOSES   Final diagnoses:  Brain edema ([place])  Facial droop   Malignant neoplasm of lung, unspecified laterality, unspecified part of lung ([place])      MEDICATIONS GIVEN DURING THIS VISIT:  Medications  dexamethasone (DECADRON) injection 10 mg (has no administration in time range)  levETIRAcetam (KEPPRA) IVPB 1000 mg/100 mL premix (has no administration in time range)  ED Discharge Orders    None       Note:  This document was prepared using Dragon voice recognition software and may include unintentional dictation errors.   Vanessa Menard, MD 08/03/19 1420

## 2019-08-03 NOTE — ED Provider Notes (Signed)
-----------------------------------------   7:51 PM on 08/03/2019 -----------------------------------------  Patient's MRI unfortunately confirms what appears to be a right frontal metastases with some surrounding edema and subcentimeter shift.  I spoke to Dr. Cari Caraway who has reviewed the images believe the patient would be safe for discharge home on Keppra and Decadron which I will write for the patient.  I discussed with the patient the options of admission versus discharge home, patient strongly wishes to go home.  Patient agreeable to plan of care and will follow up.   Harvest Dark, MD 08/03/19 [phone]

## 2019-08-03 NOTE — ED Notes (Signed)
Patient discharged to home per MD order. Patient in stable condition, and deemed medically cleared by ED provider for discharge. Discharge instructions reviewed with patient/family using "Teach Back"; verbalized understanding of medication education and administration, and information about follow-up care. Denies further concerns. ° °

## 2019-08-03 NOTE — ED Triage Notes (Signed)
First Nurse Note:  Arrives for ED evaluation os "CVA symptoms that started 4/13 @ 1030."  Has history of metastatic CA.  PCP wants patient to be ruled out for CVA vs Mets to brain.

## 2019-08-03 NOTE — Progress Notes (Signed)
Date:  08/03/2019   Name:  Kristina Wright   DOB:  [birth year]   MRN:  [id number]   Chief Complaint: Follow-up (had a blood draw yesterday am and then went to church to move supplies. Afterwards, felt SOB and like she could not speak. another person at church called 911 and EMTs came. She was advised to go to hospital, but refused. daughter stayed with her during the rest of the day yesterday. she felt "shaky and my mouth was trembling. i have had to pee several times during the day and at night.")  Neurologic Problem The patient's pertinent negatives include no altered mental status, clumsiness, focal sensory loss, focal weakness, loss of balance, memory loss, near-syncope, slurred speech, syncope, visual change or weakness. Primary symptoms comment: aphasia. This is a new problem. The current episode started yesterday. The problem has been gradually improving since onset. There was left-sided, facial and upper extremity (uncontrollable arm movement) focality noted. Associated symptoms include fatigue, headaches and palpitations. Pertinent negatives include no abdominal pain, auditory change, aura, back pain, bladder incontinence, bowel incontinence, chest pain, confusion, diaphoresis, dizziness, fever, light-headedness, nausea, neck pain, shortness of breath, vertigo or vomiting. (Expressive aphasia) Past treatments include nothing. The treatment provided moderate relief. There is no history of a bleeding disorder, a clotting disorder, a CVA, dementia, head trauma, liver disease, mood changes or seizures. (Cancer)  Shortness of Breath This is a chronic problem. The current episode started yesterday. Associated symptoms include headaches. Pertinent negatives include no abdominal pain, chest pain, claudication, coryza, ear pain, fever, hemoptysis, leg pain, leg swelling, neck pain, orthopnea, PND, rash, rhinorrhea, sore throat, sputum production, swollen glands, syncope, vomiting or wheezing. (Cancer)     Lab Results  Component Value Date   CREATININE 1.01 (H) 08/02/2019   BUN 13 08/02/2019   NA 140 08/02/2019   K 4.5 08/02/2019   CL 104 08/02/2019   CO2 24 08/02/2019   Lab Results  Component Value Date   CHOL 150 08/02/2019   HDL 45 08/02/2019   LDLCALC 84 08/02/2019   TRIG 119 08/02/2019   CHOLHDL 3.5 03/03/2019   Lab Results  Component Value Date   TSH 3.341 06/20/2019   No results found for: HGBA1C Lab Results  Component Value Date   WBC 5.8 08/02/2019   HGB 13.4 08/02/2019   HCT 38.9 08/02/2019   MCV 90 08/02/2019   PLT 278 08/02/2019   Lab Results  Component Value Date   ALT 12 08/02/2019   AST 15 08/02/2019   ALKPHOS 93 08/02/2019   BILITOT 0.3 08/02/2019     Review of Systems  Constitutional: Positive for fatigue. Negative for chills, diaphoresis, fever and unexpected weight change.  HENT: Negative for congestion, ear discharge, ear pain, rhinorrhea, sinus pressure, sneezing and sore throat.   Eyes: Negative for photophobia, pain, discharge, redness and itching.  Respiratory: Negative for cough, hemoptysis, sputum production, shortness of breath, wheezing and stridor.   Cardiovascular: Positive for palpitations. Negative for chest pain, orthopnea, claudication, leg swelling, syncope, PND and near-syncope.  Gastrointestinal: Negative for abdominal pain, blood in stool, bowel incontinence, constipation, diarrhea, nausea and vomiting.  Endocrine: Negative for cold intolerance, heat intolerance, polydipsia, polyphagia and polyuria.  Genitourinary: Negative for bladder incontinence, dysuria, flank pain, frequency, hematuria, menstrual problem, pelvic pain, urgency, vaginal bleeding and vaginal discharge.  Musculoskeletal: Negative for arthralgias, back pain, myalgias and neck pain.  Skin: Negative for rash.  Allergic/Immunologic: Negative for environmental allergies and food allergies.  Neurological: Positive  for facial asymmetry and headaches. Negative for  dizziness, vertigo, focal weakness, syncope, weakness, light-headedness, numbness and loss of balance.  Hematological: Negative for adenopathy. Does not bruise/bleed easily.  Psychiatric/Behavioral: Negative for confusion, dysphoric mood and memory loss. The patient is not nervous/anxious.     Patient Active Problem List   Diagnosis Date Noted  . Elevated TSH 06/20/2019  . Chronic obstructive pulmonary disease ([place]) 12/13/2018  . Left lower lobe pulmonary nodule 11/29/2018  . Breast nodule 08/25/2018  . Anxiety 08/22/2018  . Genetic testing 06/11/2018  . Family history of breast cancer   . Family history of colon cancer   . Family history of ovarian cancer   . Family history of stomach cancer   . Family history of throat cancer   . Metastatic adenocarcinoma to lung with unknown primary site [facility]) 04/27/2018  . Depression 03/12/2018  . Malignant neoplasm of lung ([place]) 03/01/2018  . Goals of care, counseling/discussion 03/01/2018  . Tobacco use disorder 02/08/2016  . Essential hypertension 02/08/2016  . PVD (peripheral vascular disease) ([place]) 02/08/2016  . Blue toe syndrome of right lower extremity ([place]) 02/08/2016  . Adult BMI > 30 07/31/2015  . High risk medication use 07/31/2015  . Carpal tunnel syndrome on both sides 10/30/2014  . Postmenopause atrophic vaginitis 05/15/2014  . Sensory urge incontinence 05/15/2014  . Vitamin D deficiency 04/25/2014  . Extremity pain 11/22/2013  . Numbness 11/22/2013  . Sleep disorder 11/22/2013  . Chronic insomnia 11/10/2013  . Foot pain, left 11/10/2013  . Hypothyroidism 11/10/2013  . Mixed hyperlipidemia 11/10/2013    No Known Allergies  Past Surgical History:  Procedure Laterality Date  . BILATERAL CARPAL TUNNEL RELEASE    . BREAST CYST ASPIRATION Left   . LUNG BIOPSY    . PARTIAL HYSTERECTOMY    . PORTA CATH INSERTION N/A 03/03/2018   Procedure: PORTA CATH INSERTION;  Surgeon: Katha Cabal, MD;  Location: [place] [facility];  Service: Cardiovascular;  Laterality: N/A;  . TUBAL LIGATION      Social History   Tobacco Use  . Smoking status: Former Smoker    Packs/day: 1.00    Years: 25.00    Pack years: 25.00    Types: Cigarettes    Quit date: 11/19/2017    Years since quitting: 1.7  . Smokeless tobacco: Never Used  Substance Use Topics  . Alcohol use: No  . Drug use: Yes    Types: Marijuana    Comment: occasional     Medication list has been reviewed and updated.  Current Meds  Medication Sig  . ALPRAZolam (XANAX) 0.25 MG tablet Take 1 tablet (0.25 mg total) by mouth at bedtime as needed. for anxiety  . aspirin EC 81 MG tablet Take 81 mg by mouth daily.  Marland Kitchen atorvastatin (LIPITOR) 10 MG tablet Take 1 tablet (10 mg total) by mouth daily.  . busPIRone (BUSPAR) 5 MG tablet Take 1 tablet (5 mg total) by mouth daily.  . clopidogrel (PLAVIX) 75 MG tablet Take 1 tablet (75 mg total) by mouth daily.  Marland Kitchen FLUoxetine (PROZAC) 10 MG capsule Take 1 capsule (10 mg total) by mouth daily.  . hydrOXYzine (ATARAX/VISTARIL) 10 MG tablet Take 1 tablet (10 mg total) by mouth daily.  Marland Kitchen levothyroxine (SYNTHROID) 112 MCG tablet Take 1 tablet (112 mcg total) by mouth daily.  Marland Kitchen lisinopril-hydrochlorothiazide (ZESTORETIC) 10-12.5 MG tablet Take 1 tablet by mouth daily.  . Multiple Vitamins-Minerals (MULTIVITAMIN ADULT PO) Take 1 tablet by mouth daily.   Marland Kitchen  nystatin cream (MYCOSTATIN) Apply 1 application topically 2 (two) times daily.  Marland Kitchen olopatadine (PATANOL) 0.1 % ophthalmic solution Place 1 drop into both eyes 2 (two) times daily.  Marland Kitchen TAGRISSO 80 MG tablet TAKE 1 TABLET (80 MG TOTAL) BY MOUTH DAILY.  Marland Kitchen triamcinolone cream (KENALOG) 0.1 % Apply 1 application topically 2 (two) times daily.  Marland Kitchen umeclidinium-vilanterol (ANORO ELLIPTA) 62.5-25 MCG/INH AEPB Inhale 1 puff into the lungs daily.  . VENTOLIN HFA 108 (90 Base) MCG/ACT inhaler Inhale 1-2 puffs into the lungs every 6 (six) hours as needed for wheezing or shortness of  breath.    PHQ 2/9 Scores 08/01/2019 03/03/2019 12/13/2018 01/07/2018  PHQ - 2 Score 2 0 0 1  PHQ- 9 Score 4 1 1 6     BP Readings from Last 3 Encounters:  08/03/19 (!) 160/80  08/01/19 130/80  07/05/19 139/80    Physical Exam Vitals and nursing note reviewed.  Constitutional:      General: She is not in acute distress.    Appearance: She is not diaphoretic.  HENT:     Head: Normocephalic and atraumatic.     Right Ear: External ear normal.     Left Ear: External ear normal.     Nose: Nose normal.     Mouth/Throat:     Mouth: Mucous membranes are moist.  Eyes:     General:        Right eye: No discharge.        Left eye: No discharge.     Conjunctiva/sclera: Conjunctivae normal.     Pupils: Pupils are equal, round, and reactive to light.  Neck:     Thyroid: No thyromegaly.     Vascular: No JVD.  Cardiovascular:     Rate and Rhythm: Normal rate and regular rhythm.     Heart sounds: Normal heart sounds. No murmur. No friction rub. No gallop.   Pulmonary:     Effort: Pulmonary effort is normal.     Breath sounds: Normal breath sounds. No rhonchi or rales.  Abdominal:     General: Bowel sounds are normal.     Palpations: Abdomen is soft. There is no mass.     Tenderness: There is no abdominal tenderness. There is no guarding or rebound.  Musculoskeletal:        General: Normal range of motion.     Cervical back: Normal range of motion and neck supple.  Lymphadenopathy:     Cervical: No cervical adenopathy.  Skin:    General: Skin is warm and dry.  Neurological:     Mental Status: She is alert.     Cranial Nerves: Facial asymmetry present. No dysarthria.     Sensory: No sensory deficit.     Motor: Motor function is intact. No abnormal muscle tone.     Deep Tendon Reflexes: Reflexes are normal and symmetric.     Comments: Ptosis/left with assymmetrical smile  Psychiatric:        Attention and Perception: Attention normal.        Mood and Affect: Mood is depressed.         Speech: Speech normal.        Behavior: Behavior normal.     Wt Readings from Last 3 Encounters:  08/03/19 214 lb (97.1 kg)  08/01/19 216 lb (98 kg)  07/05/19 217 lb 4.8 oz (98.6 kg)    BP (!) 160/80   Pulse 88   Ht 5' 9"  (1.753 m)   Wt 214 lb (  97.1 kg)   BMI 31.60 kg/m   Assessment and Plan:  1. Irregularly irregular pulse rhythm New onset patient was noted to have an irregular pulse rate upon arrival today given the circumstances yesterday of what sounded like a TIA was given an EKG to rule out atrial fibrillation.  EKG was performed: EKG was performed and the following was noted.  Rate noted to be 86 and regular with an occasional ectopic ventricular beat.  PR interval QRS and QT intervals within normal limits.  Axis is more normal and there is no LVH criteria met by EKG.  Low voltage was noted across the precordial leads.  There is no ischemic changes noted. - EKG 12-Lead  2. TIA (transient ischemic attack) She had an episode in which he lost control least of her upper extremity and may be a lower extremity and new which she wanted to say but could not say and conveyed that she was having a stroke to the people at church.  This lasted approximately 5 minutes after which she was seen by rescue and was suggested that she go to the emergency room.  Patient decided not to go.  During the course of the last 12 hours there is been no recurrence however it is noted that patient has a slight ptosis of the left eye with asymmetry of the smile.  This may reflect a residual and the possibility of CVA patient does have a history of metastatic adenocarcinoma of unknown primary and has had recent onset of headaches.  Discussion with Dr. Mike Gip who is her oncologist said that she would recommend getting a immediate MRI to rule out metastases and the possibility of need for steroids and increase intervention.  It was elected to send patient directly to the emergency room since she still has  residual from her episode yesterday and to rule out that she had a CVA versus metastatic concern.  3. Frequent urination Patient's had frequent urination urinalysis was normal review of labs from yesterday notes no hyperglycemia. - POCT urinalysis dipstick  4. Blue toe syndrome of right lower extremity ([place]) Patient has a history of blue toe syndrome which has been followed by [facility].  I am concerned the possibility of carotid atherosclerosis which could have embolized and/or cerebrovascular disease.  This is a risk and should be delineated with ER evaluation  5. Metastatic adenocarcinoma to lung with unknown primary site, unspecified laterality [facility]) As noted above in 2019 patient was diagnosed with metastatic adenocarcinoma to the lung with an unknown primary site.  She is undergoing chemotherapy Mike Gip) and radiation therapy ( [place]).  With the recent onset of headaches and the episode yesterday patient is being referred to rule out CVA versus metastatic disease.

## 2019-08-04 NOTE — Progress Notes (Signed)
[facility]  [address] [place], [place] [address] Phone: [phone]  Fax: [phone]   Office Visit:  08/05/2019  Referring physician: Juline Patch, MD  Chief Complaint: Kristina Wright is a 64 y.o. female with stage IV adenocarcinoma of the lung on osimertinib who is seen for assessment after interval ER evaluation and diagnosis of brain metastasis.  HPI: The patient was last seen in the medical oncology clinic on 07/25/2019. At that time, Kristina Wright felt "ok".  Kristina Wright denied any shortness of breath or cough. Kristina Wright described headaches since her second COVID-19 vaccine. Kristina Wright had intermittent right hand numbness similar to prior carpal tunnel disease.  Patient was seen at [facility] by Dr. Ronnald Ramp on 08/03/2019 for a neurologic episode Kristina Wright had on 08/02/2019. Kristina Wright described about 5 minutes where Kristina Wright felt like her left arm was jerking and Kristina Wright was unable to speak.  On exam, Kristina Wright had a little bit of facial asymmetry.  I was contacted and suggested head MRI to r/o metastasis.  Kristina Wright was referred directly to [facility] emergency department for CNS imaging to r/o CVA versus metastatic concern.   Kristina Wright was seen at [facility] ER on 08/03/2019. Head CT revealed peripherally hyperdense 2.5 cm right frontal lesion is concerning for hemorrhagic metastases vs primary neoplasm.  There was right frontal perilesional edema involving the right basal ganglia and thalamus. There was partial right lateral ventricle effacement with 4 mm leftward midline shift.   Head MRI on 08/03/2019 revealed a 2.1 x 2.1 x 2.5 cm right frontal mass involving/abutting the lateral precentral gyrus likely reflecting a metastasis. There was significant surrounding edema with mild regional mass effect including subcentimeter leftward midline shift. There was no hydrocephalus.  Kristina Wright was seen by Dr Izora Ribas, neurosurgeon, while in the ER.  Kristina Wright reported a possible seizure with a change in voice and left arm shaking.  In  the ER, Kristina Wright felt relatively normal.  Kristina Wright denied any headache, nausea or vomiting.  Cognition was at baseline.  Speech was normal.  Kristina Wright denied any focal numbness or weakness.  Kristina Wright received Decadron 10 mg IV then began 4 mg po q 6 hours.  Kristina Wright received 1 gm Keppra load then 500 mg po BID. Surgery versus stereotactic radiosurgery [facility]) was discussed. Secondary to a history of recent seizure, Kristina Wright was advised not to drive.  The patient described the events of her neurological episode. Kristina Wright Kristina Wright got blood drawn for Dr. Ronnald Ramp that morning and then Kristina Wright went to work to unload some boxes, at that time Kristina Wright wasn't feeling good. Her left arm started 'jerking' around and the EMS came. Her BP was high and Kristina Wright was advised to see her PCP. Kristina Wright met with Dr. Ronnald Ramp the next morning and Kristina Wright then sent her to the ER. Kristina Wright notes Kristina Wright is taking Keppra 500 mg 2 x a day. Kristina Wright felt a little nauseous last night. Her headaches have improved. Kristina Wright has been having a little drool. Kristina Wright denies any issues with eating and drinking. We discused increasing her Decadron 4 mg 4 times a day.   Kristina Wright says Kristina Wright does not have an appointment with Dr. Baruch Gouty on Monday, 08/08/2019, but would like to schedule one.    Past Medical History:  Diagnosis Date  . Cancer ([place]) 1989   cervical  . Depression   . Family history of breast cancer   . Family history of colon cancer   . Family history of ovarian cancer   . Family history of  stomach cancer   . Family history of throat cancer   . Hyperlipidemia   . Hypertension   . Malignant neoplasm of lung ([place]) 03/01/2018  . Thyroid disease     Past Surgical History:  Procedure Laterality Date  . BILATERAL CARPAL TUNNEL RELEASE    . BREAST CYST ASPIRATION Left   . LUNG BIOPSY    . PARTIAL HYSTERECTOMY    . PORTA CATH INSERTION N/A 03/03/2018   Procedure: PORTA CATH INSERTION;  Surgeon: Katha Cabal, MD;  Location: [facility];  Service: Cardiovascular;  Laterality: N/A;  . TUBAL LIGATION        Family History  Problem Relation Age of Onset  . Colon cancer Mother 36  . Colon cancer Maternal Grandmother 78  . Hypertension Father   . Breast cancer Sister 64  . Throat cancer Brother   . Heart attack Maternal Aunt   . Ovarian cancer Maternal Aunt 70  . Stomach cancer Paternal Grandmother        dx >50  . Throat cancer Brother     Social History:  reports that Kristina Wright quit smoking about 20 months ago. Her smoking use included cigarettes. Kristina Wright has a 25.00 pack-year smoking history. Kristina Wright has never used smokeless tobacco. Kristina Wright reports current drug use. Drug: Marijuana. Kristina Wright reports that Kristina Wright does not drink alcohol. 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 Her first grandson named was born in 06/2019. The patient is alone  today.   Allergies: No Known Allergies  Current Medications: Current Outpatient Medications  Medication Sig Dispense Refill  . ALPRAZolam (XANAX) 0.25 MG tablet Take 1 tablet (0.25 mg total) by mouth at bedtime as needed. for anxiety 30 tablet 5  . aspirin EC 81 MG tablet Take 81 mg by mouth daily.    Marland Kitchen atorvastatin (LIPITOR) 10 MG tablet Take 1 tablet (10 mg total) by mouth daily. 90 tablet 1  . busPIRone (BUSPAR) 5 MG tablet Take 1 tablet (5 mg total) by mouth daily. 90 tablet 1  . clopidogrel (PLAVIX) 75 MG tablet Take 1 tablet (75 mg total) by mouth daily. 90 tablet 1  . dexamethasone (DECADRON) 4 MG tablet Take 1 tablet (4 mg total) by mouth 2 (two) times daily. 28 tablet 0  . FLUoxetine (PROZAC) 10 MG capsule Take 1 capsule (10 mg total) by mouth daily. 90 capsule 1  . hydrOXYzine (ATARAX/VISTARIL) 10 MG tablet Take 1 tablet (10 mg total) by mouth daily. 30 tablet 5  . levETIRAcetam (KEPPRA) 500 MG tablet Take 1 tablet (500 mg total) by mouth 2 (two) times daily. 60 tablet 2  .  levothyroxine (SYNTHROID) 112 MCG tablet Take 1 tablet (112 mcg total) by mouth daily. 90 tablet 1  . lisinopril-hydrochlorothiazide (ZESTORETIC) 10-12.5 MG tablet Take 1 tablet by mouth daily. 90 tablet 1  . Multiple Vitamins-Minerals (MULTIVITAMIN ADULT PO) Take 1 tablet by mouth daily.     Marland Kitchen nystatin cream (MYCOSTATIN) Apply 1 application topically 2 (two) times daily. 30 g 0  . olopatadine (PATANOL) 0.1 % ophthalmic solution Place 1 drop into both eyes 2 (two) times daily. 5 mL 12  . TAGRISSO 80 MG tablet TAKE 1 TABLET (80 MG TOTAL) BY MOUTH DAILY. 30 tablet 2  . triamcinolone cream (KENALOG) 0.1 % Apply 1 application topically 2 (two) times daily. 30 g 1  . umeclidinium-vilanterol (ANORO ELLIPTA) 62.5-25 MCG/INH AEPB  Inhale 1 puff into the lungs daily. 60 each 5  . VENTOLIN HFA 108 (90 Base) MCG/ACT inhaler Inhale 1-2 puffs into the lungs every 6 (six) hours as needed for wheezing or shortness of breath. 18 g 1  . meclizine (ANTIVERT) 25 MG tablet Take 1 tablet (25 mg total) by mouth 3 (three) times daily as needed for dizziness. (Patient not taking: Reported on 08/01/2019) 30 tablet 0   No current facility-administered medications for this visit.    Review of Systems  Constitutional: Negative.  Negative for chills, diaphoresis, fever, malaise/fatigue and weight loss.       Feels "ok".  HENT: Negative.  Negative for congestion, ear pain, hearing loss, nosebleeds, sinus pain, sore throat and tinnitus.   Eyes: Negative.  Negative for blurred vision, double vision, photophobia, discharge and redness.  Respiratory: Negative.  Negative for cough, sputum production, shortness of breath (uses inhaler) and wheezing.   Cardiovascular: Negative.  Negative for chest pain, palpitations, leg swelling and PND.  Gastrointestinal: Positive for nausea (last night ). Negative for abdominal pain, blood in stool, constipation, diarrhea, heartburn, melena and vomiting.  Genitourinary: Negative.  Negative for  dysuria, frequency, hematuria and urgency.  Musculoskeletal: Negative.  Negative for back pain, falls, joint pain, myalgias and neck pain.  Skin: Negative.  Negative for itching and rash.  Neurological: Negative for dizziness, tingling, tremors, sensory change, speech change, focal weakness, weakness and headaches.       Possible interval seizure , now on Keppra.  Endo/Heme/Allergies: Does not bruise/bleed easily.       Thyroid disease on Synthroid.  Psychiatric/Behavioral: Negative.  Negative for depression, memory loss and substance abuse. The patient is not nervous/anxious (takes Xanax occasionally) and does not have insomnia.   All other systems reviewed and are negative.  Performance status (ECOG):  1  Physical Exam  Constitutional: Kristina Wright is oriented to person, place, and time. Kristina Wright appears well-developed and well-nourished. No distress.  HENT:  Head: Normocephalic and atraumatic.  Mouth/Throat: Oropharynx is clear and moist.  Shoulder length brown hair.  Eyes: Pupils are equal, round, and reactive to light. Conjunctivae and EOM are normal. No scleral icterus.  Glasses.  Brown eyes.  Neck: No JVD present.  Cardiovascular: Normal rate, regular rhythm and intact distal pulses.  No murmur heard. Pulmonary/Chest: Effort normal and breath sounds normal. No respiratory distress. Kristina Wright has no wheezes. Kristina Wright has no rales. Kristina Wright exhibits no tenderness.  Abdominal: Soft. Bowel sounds are normal. Kristina Wright exhibits no distension and no mass. There is no abdominal tenderness. There is no rebound and no guarding.  Musculoskeletal:        General: No tenderness or edema. Normal range of motion.     Cervical back: Normal range of motion and neck supple.  Lymphadenopathy:       Head (right side): No preauricular, no posterior auricular and no occipital adenopathy present.       Head (left side): No preauricular, no posterior auricular and no occipital adenopathy present.    Kristina Wright has no cervical adenopathy.     Kristina Wright has no axillary adenopathy.       Right: No inguinal and no supraclavicular adenopathy present.       Left: No inguinal and no supraclavicular adenopathy present.  Neurological: Kristina Wright is alert and oriented to person, place, and time. Kristina Wright displays no tremor and normal reflexes. No cranial nerve deficit or sensory deficit. Kristina Wright exhibits normal muscle tone. Coordination and gait normal.  Reflex Scores:  Patellar reflexes are 2+ on the right side and 2+ on the left side. Skin: Skin is warm and dry. No rash noted. Kristina Wright is not diaphoretic. No erythema. No pallor.  Psychiatric: Kristina Wright has a normal mood and affect. Her behavior is normal. Judgment and thought content normal.  Nursing note reviewed.   Admission on 08/03/2019, Discharged on 08/03/2019  Component Date Value Ref Range Status  . Prothrombin Time 08/03/2019 13.4  11.4 - 15.2 seconds Final  . INR 08/03/2019 1.0  0.8 - 1.2 Final   Comment: (NOTE) INR goal varies based on device and disease states. Performed at [facility], [address], [place], [place] [address]   . aPTT 08/03/2019 32  24 - 36 seconds Final   Performed at [facility], [address] [place], [place] [address]  . WBC 08/03/2019 6.8  4.0 - 10.5 K/uL Final  . RBC 08/03/2019 4.55  3.87 - 5.11 MIL/uL Final  . Hemoglobin 08/03/2019 13.8  12.0 - 15.0 g/dL Final  . HCT 08/03/2019 42.2  36.0 - 46.0 % Final  . MCV 08/03/2019 92.7  80.0 - 100.0 fL Final  . MCH 08/03/2019 30.3  26.0 - 34.0 pg Final  . MCHC 08/03/2019 32.7  30.0 - 36.0 g/dL Final  . RDW 08/03/2019 12.4  11.5 - 15.5 % Final  . Platelets 08/03/2019 277  150 - 400 K/uL Final  . nRBC 08/03/2019 0.0  0.0 - 0.2 % Final   Performed at [facility], [address] [place], [place] [address]  . Neutrophils Relative % 08/03/2019 72  % Final  . Neutro Abs 08/03/2019 4.8  1.7 - 7.7 K/uL Final  . Lymphocytes Relative 08/03/2019 23  % Final  . Lymphs Abs 08/03/2019 1.6  0.7 - 4.0 K/uL  Final  . Monocytes Relative 08/03/2019 5  % Final  . Monocytes Absolute 08/03/2019 0.4  0.1 - 1.0 K/uL Final  . Eosinophils Relative 08/03/2019 0  % Final  . Eosinophils Absolute 08/03/2019 0.0  0.0 - 0.5 K/uL Final  . Basophils Relative 08/03/2019 0  % Final  . Basophils Absolute 08/03/2019 0.0  0.0 - 0.1 K/uL Final  . Immature Granulocytes 08/03/2019 0  % Final  . Abs Immature Granulocytes 08/03/2019 0.03  0.00 - 0.07 K/uL Final   Performed at [facility], [address], [place], [place] [address]  . Sodium 08/03/2019 140  135 - 145 mmol/L Final  . Potassium 08/03/2019 4.0  3.5 - 5.1 mmol/L Final  . Chloride 08/03/2019 104  98 - 111 mmol/L Final  . CO2 08/03/2019 25  22 - 32 mmol/L Final  . Glucose, Bld 08/03/2019 122* 70 - 99 mg/dL Final   Glucose reference range applies only to samples taken after fasting for at least 8 hours.  . BUN 08/03/2019 11  8 - 23 mg/dL Final  . Creatinine, Ser 08/03/2019 0.97  0.44 - 1.00 mg/dL Final  . Calcium 08/03/2019 9.4  8.9 - 10.3 mg/dL Final  . Total Protein 08/03/2019 7.6  6.5 - 8.1 g/dL Final  . Albumin 08/03/2019 4.0  3.5 - 5.0 g/dL Final  . AST 08/03/2019 17  15 - 41 U/L Final  . ALT 08/03/2019 16  0 - 44 U/L Final  . Alkaline Phosphatase 08/03/2019 79  38 - 126 U/L Final  . Total Bilirubin 08/03/2019 0.9  0.3 - 1.2 mg/dL Final  . GFR calc non Af Amer 08/03/2019 >60  >60 mL/min Final  . GFR calc  Af Amer 08/03/2019 >60  >60 mL/min Final  . Anion gap 08/03/2019 11  5 - 15 Final   Performed at [facility], [address], [place], [place] [address]  . Glucose-Capillary 08/03/2019 109* 70 - 99 mg/dL Final   Glucose reference range applies only to samples taken after fasting for at least 8 hours.  . Glucose-Capillary 08/03/2019 132* 70 - 99 mg/dL Final   Glucose reference range applies only to samples taken after fasting for at least 8 hours.  Office Visit on 08/03/2019  Component Date Value Ref Range Status  . Color, UA  08/03/2019 yellow   Final  . Clarity, UA 08/03/2019 clear   Final  . Glucose, UA 08/03/2019 Negative  Negative Final  . Bilirubin, UA 08/03/2019 negative   Final  . Ketones, UA 08/03/2019 negative   Final  . Spec Grav, UA 08/03/2019 1.020  1.010 - 1.025 Final  . Blood, UA 08/03/2019 negative   Final  . pH, UA 08/03/2019 6.0  5.0 - 8.0 Final  . Protein, UA 08/03/2019 Negative  Negative Final  . Urobilinogen, UA 08/03/2019 0.2  0.2 or 1.0 E.U./dL Final  . Nitrite, UA 08/03/2019 negative   Final  . Leukocytes, UA 08/03/2019 Negative  Negative Final  . Appearance 08/03/2019 yellow   Final  . Odor 08/03/2019 none   Final    Assessment:  NANDITA MATHENIA is a 64 y.o. female with stage IV adenocarcinoma of the lungs/p right axillary biopsy on 02/24/2018. Pathologyrevealed metastatic adenocarcinoma c/w lung primary. Tumor cells were positive for CK7 and TTF-1 with minimal blush reactivity for CK20. Tumor cells were negative for GATA3, ER, and PR. Omniseq testingrevealed EGFR c.2582>A (L861Q) mutation and PDL-1 60% TPS. CEAwas 2.9 on 05/17/2018.  PET scanon 02/09/2018 revealed an unusual pattern of nodal metastasis with intensely hypermetabolic enlarged precarinal lymph node, RIGHT hilar lymph node and RIGHT axillary lymph node. Recommend biopsy of the RIGHT axillarylymph node. There was no metabolic activity associated with the RIGHT lobe pulmonary nodule. There was no additional evidence primary or metastatic carcinoma. Clinical stageT1N3M1.  Chest CTon 11/24/2018 revealed an enlarging left lower lobe pulmonary nodule measuring1.5 x 1.4 cm, concerning for progressive malignancy. There was mild diffuse bronchial wall thickening with mild centrilobular and paraseptal emphysema; imaging findings suggestive of underlying COPD.  PETscanon 12/29/2018 revealedan isolated 1.2 cm hypermetabolic LLL pulmonary nodule (SUV 8.0). There was no evidence of metastasis. Sherefused a biopsy.    Shereceived SBRT(6,000 cGy in 5 fractions) to the left lower lobe nodulefrom 02/03/2019 - 02/28/2019.  ChestCTon 04/20/2019 showed an interval reduction in volume of theleftlower lobe nodule following stereotactic body radiation treatment. There was no new or progressive malignancy.  Chest CT on 07/20/2019 revealed continued evolutionary changes secondary to external beam radiation involving the lingula and superior segment of left lower lobe. There was no specific findings to suggest residual or recurrence of tumor.  Head MRIon 03/10/2018 revealed no evidence of metastatic disease.  Head MRI on 08/03/2019 revealed a 2.1 x 2.1 x 2.5 cm right frontal mass involving/abutting the lateral precentral gyrus likely reflecting a metastasis. There was significant surrounding edema with mild regional mass effect including subcentimeter leftward midline shift. There was no hydrocephalus.  Kristina Wright began osimertinibon 03/25/2018.Echoon 07/27/2018 showed an ejection fraction in the range of 60-65%. Kristina Wright is tolerating treatment well.  Diagnostic left mammogramon 11/01/2018 revealed a benign left breast mass unchanged mammographically since at least 2016. There were no suspicious findings in the left breast.  Kristina Wright has  a history of cervical cancerin 1989. Kristina Wright underwent cryotherapy.  Kristina Wright received her second dose of the COVID-19 vaccine on 07/13/2019.   Symptomatically, Kristina Wright denies any headache, visual changes, numbness, weakness, balance or coordination issues.  Neurologic exam is normal.  Plan: 1.   Review labs from 08/03/2019. 2.   Stage IV adenocarcinoma of the Lung (EGFR L861Q mutation) Clinically,Kristina Wright was doing well until apparent recent seizure. PET scan on 12/29/2018 revealed an isolated 1.2 cm LLL pulmonary nodule. Kristina Wright received SBRT (completed 02/28/2019).  Chest CT on 07/20/2019 revealed continued evolutionary changes  secondary to radiation involving the lingula and superior segment of left lower lobe.    There was no residual or recurrence of tumor.  Discuss interval head MRI s/p recent seizure.   Suspect metastatic disease from lung cancer.  Discuss plan for restaging chest, abdomen, and pelvis CT earlier than planned.             Continue osimertinib 80 mg a day. 3.Right frontal CNS mass  Head MRI on 08/03/2019 personally reviewed.  Agree with radiology interpretation.   Lesion appears isolated with surrounding edema.  Discuss plan for Decadron 4 mg every 6 hours to decrease swelling.   Anticipate taper schedule per Dr Baruch Gouty.  Discuss options of surgical resection or stereotactic radiosurgery [facility]).   Patient interested in radiation.  Continue Keppra for seizure prophylaxis.  Consult radiation oncology. 4.   Port-a-cath Continue port-a-cath flushes every 12 weeks during COVID-19 pandemic. 5.   Reschedule chest, abdomen, and pelvis CT on 08/09/2019. 6.   Schedule appt with Dr Baruch Gouty on Monday, 08/08/2019. 7.   RTC after CT scans for MD assess and review of imaging.  I discussed the assessment and treatment plan with the patient.  The patient was provided an opportunity to ask questions and all were answered.  The patient agreed with the plan and demonstrated an understanding of the instructions.  The patient was advised to call back or seek an in person evaluation if the symptoms worsen or if the condition fails to improve as anticipated.  I provided 34 minutes (2:52 PM - 3:26 PM) of face-to-face time during this this encounter and > 50% was spent counseling as documented under my assessment and plan.  An additional 10-15 minutes were spent reviewing her chart (Epic and Care Everywhere) including notes, labs, and imaging studies as well as discussing her imaging with Dr Izora Ribas and Dr Baruch Gouty.    Nolon Stalls, MD, PhD  08/05/2019, 2:52 PM  I, Heywood Footman, am  acting as a scribe for Lequita Asal, MD, PhD  I, Westin Knotts C. Mike Gip, MD, have reviewed the above documentation for accuracy and completeness, and I agree with the above.

## 2019-08-05 ENCOUNTER — Inpatient Hospital Stay (HOSPITAL_BASED_OUTPATIENT_CLINIC_OR_DEPARTMENT_OTHER): Payer: 59 | Admitting: Hematology and Oncology

## 2019-08-05 ENCOUNTER — Other Ambulatory Visit: Payer: Self-pay

## 2019-08-05 ENCOUNTER — Encounter: Payer: Self-pay | Admitting: Hematology and Oncology

## 2019-08-05 VITALS — BP 125/60 | HR 90 | Temp 97.5°F | Resp 18 | Ht 69.0 in | Wt 214.2 lb

## 2019-08-05 DIAGNOSIS — Z7189 Other specified counseling: Secondary | ICD-10-CM

## 2019-08-05 DIAGNOSIS — C7931 Secondary malignant neoplasm of brain: Secondary | ICD-10-CM | POA: Diagnosis not present

## 2019-08-05 DIAGNOSIS — C801 Malignant (primary) neoplasm, unspecified: Secondary | ICD-10-CM | POA: Diagnosis not present

## 2019-08-05 DIAGNOSIS — R519 Headache, unspecified: Secondary | ICD-10-CM | POA: Diagnosis not present

## 2019-08-05 DIAGNOSIS — Z452 Encounter for adjustment and management of vascular access device: Secondary | ICD-10-CM | POA: Diagnosis not present

## 2019-08-05 DIAGNOSIS — C7800 Secondary malignant neoplasm of unspecified lung: Secondary | ICD-10-CM

## 2019-08-05 DIAGNOSIS — C3432 Malignant neoplasm of lower lobe, left bronchus or lung: Secondary | ICD-10-CM | POA: Diagnosis present

## 2019-08-05 MED ORDER — SUCRALFATE 1 G PO TABS: 1.0000 g | ORAL_TABLET | Freq: Three times a day (TID) | ORAL | 1 refills | Status: DC

## 2019-08-05 MED ORDER — PANTOPRAZOLE SODIUM 20 MG PO TBEC: 20.0000 mg | DELAYED_RELEASE_TABLET | Freq: Every day | ORAL | 1 refills | Status: DC

## 2019-08-05 NOTE — Patient Instructions (Addendum)
  Carafate will coat your stomach and prevent irritation from the steroids. Ask your pharmacist about taking your other meds and timing of the carafate.  Decadron (dexamethasone) 4 mg every 6 hours.

## 2019-08-05 NOTE — Progress Notes (Signed)
No new c/o noted today.

## 2019-08-08 ENCOUNTER — Other Ambulatory Visit: Payer: Self-pay

## 2019-08-08 DIAGNOSIS — C7931 Secondary malignant neoplasm of brain: Secondary | ICD-10-CM

## 2019-08-08 DIAGNOSIS — C7800 Secondary malignant neoplasm of unspecified lung: Secondary | ICD-10-CM

## 2019-08-09 ENCOUNTER — Other Ambulatory Visit
Admission: RE | Admit: 2019-08-09 | Discharge: 2019-08-09 | Disposition: A | Discharge status: Home / Self Care | Payer: BLUE CROSS/BLUE SHIELD | Source: Ambulatory Visit | Attending: Hematology and Oncology | Admitting: Hematology and Oncology

## 2019-08-09 ENCOUNTER — Other Ambulatory Visit: Payer: Self-pay

## 2019-08-09 ENCOUNTER — Inpatient Hospital Stay: Payer: 59

## 2019-08-09 ENCOUNTER — Ambulatory Visit
Admission: RE | Admit: 2019-08-09 | Discharge: 2019-08-09 | Disposition: A | Discharge status: Home / Self Care | Payer: BLUE CROSS/BLUE SHIELD | Source: Ambulatory Visit | Attending: Hematology and Oncology | Admitting: Hematology and Oncology

## 2019-08-09 DIAGNOSIS — C3432 Malignant neoplasm of lower lobe, left bronchus or lung: Secondary | ICD-10-CM | POA: Diagnosis not present

## 2019-08-09 DIAGNOSIS — R911 Solitary pulmonary nodule: Secondary | ICD-10-CM | POA: Insufficient documentation

## 2019-08-09 DIAGNOSIS — C7931 Secondary malignant neoplasm of brain: Secondary | ICD-10-CM

## 2019-08-09 DIAGNOSIS — C7800 Secondary malignant neoplasm of unspecified lung: Secondary | ICD-10-CM | POA: Insufficient documentation

## 2019-08-09 DIAGNOSIS — C801 Malignant (primary) neoplasm, unspecified: Secondary | ICD-10-CM

## 2019-08-09 DIAGNOSIS — R519 Headache, unspecified: Secondary | ICD-10-CM | POA: Diagnosis not present

## 2019-08-09 DIAGNOSIS — Z452 Encounter for adjustment and management of vascular access device: Secondary | ICD-10-CM | POA: Diagnosis not present

## 2019-08-09 LAB — CBC WITH DIFFERENTIAL/PLATELET
Abs Immature Granulocytes: 0.12 10*3/uL — ABNORMAL HIGH (ref 0.00–0.07)
Basophils Absolute: 0 10*3/uL (ref 0.0–0.1)
Basophils Relative: 0 %
Eosinophils Absolute: 0 10*3/uL (ref 0.0–0.5)
Eosinophils Relative: 0 %
HCT: 41.6 % (ref 36.0–46.0)
Hemoglobin: 13.8 g/dL (ref 12.0–15.0)
Immature Granulocytes: 1 %
Lymphocytes Relative: 19 %
Lymphs Abs: 2.9 10*3/uL (ref 0.7–4.0)
MCH: 30 pg (ref 26.0–34.0)
MCHC: 33.2 g/dL (ref 30.0–36.0)
MCV: 90.4 fL (ref 80.0–100.0)
Monocytes Absolute: 1.1 10*3/uL — ABNORMAL HIGH (ref 0.1–1.0)
Monocytes Relative: 8 %
Neutro Abs: 11 10*3/uL — ABNORMAL HIGH (ref 1.7–7.7)
Neutrophils Relative %: 72 %
Platelets: 321 10*3/uL (ref 150–400)
RBC: 4.6 MIL/uL (ref 3.87–5.11)
RDW: 12.7 % (ref 11.5–15.5)
WBC: 15.2 10*3/uL — ABNORMAL HIGH (ref 4.0–10.5)
nRBC: 0 % (ref 0.0–0.2)

## 2019-08-09 LAB — COMPREHENSIVE METABOLIC PANEL
ALT: 23 U/L (ref 0–44)
AST: 18 U/L (ref 15–41)
Albumin: 3.6 g/dL (ref 3.5–5.0)
Alkaline Phosphatase: 72 U/L (ref 38–126)
Anion gap: 9 (ref 5–15)
BUN: 18 mg/dL (ref 8–23)
CO2: 26 mmol/L (ref 22–32)
Calcium: 8.5 mg/dL — ABNORMAL LOW (ref 8.9–10.3)
Chloride: 101 mmol/L (ref 98–111)
Creatinine, Ser: 0.88 mg/dL (ref 0.44–1.00)
GFR calc Af Amer: >60 mL/min (ref >60)
GFR calc non Af Amer: >60 mL/min (ref >60)
Glucose, Bld: 94 mg/dL (ref 70–99)
Potassium: 3.4 mmol/L — ABNORMAL LOW (ref 3.5–5.1)
Sodium: 136 mmol/L (ref 135–145)
Total Bilirubin: 0.6 mg/dL (ref 0.3–1.2)
Total Protein: 7 g/dL (ref 6.5–8.1)

## 2019-08-09 LAB — CREATININE, SERUM
Creatinine, Ser: 0.94 mg/dL (ref 0.44–1.00)
GFR calc Af Amer: >60 mL/min (ref >60)
GFR calc non Af Amer: >60 mL/min (ref >60)

## 2019-08-09 LAB — MAGNESIUM: Magnesium: 2.3 mg/dL (ref 1.7–2.4)

## 2019-08-09 MED ORDER — SODIUM CHLORIDE 0.9% FLUSH
10.0000 mL | INTRAVENOUS | Status: DC | PRN
  Administered 2019-08-09: 10 mL via INTRAVENOUS
  Filled 2019-08-09: qty 10

## 2019-08-09 MED ORDER — HEPARIN SOD (PORK) LOCK FLUSH 100 UNIT/ML IV SOLN
500.0000 [IU] | Freq: Once | INTRAVENOUS | Status: AC
  Administered 2019-08-09: 500 [IU] via INTRAVENOUS
  Filled 2019-08-09: qty 5

## 2019-08-09 MED ORDER — IOHEXOL 300 MG/ML  SOLN
100.0000 mL | Freq: Once | INTRAMUSCULAR | Status: AC | PRN
  Administered 2019-08-09: 100 mL via INTRAVENOUS

## 2019-08-09 NOTE — Progress Notes (Signed)
[facility]  [address] [place], [place] [address] Phone: [phone]  Fax: [phone]   Clinic Day:  08/11/2019  Referring physician: Juline Patch, MD  Chief Complaint: Kristina Wright is a 64 y.o. female with stage IV adenocarcinoma of the lung on osimertiniband recent diagnosis of an isolated CNS metastasis who is seen for a 1 week assessment and review of restaging studies.   HPI: The patient was last seen in the medical oncology clinic on 08/05/2019.  At that time, she was seen for assessment after interval seizure and ER evaluation.  Head MRI from 08/03/2019 revealed a 2.5 cm right frontal mass with associated edema.  She was on Keppra 500 mg BID.  Decadron was increased from 4 mg q 12 hours to 4 mg 6 hours.  We discussed surgical resection or radiation.  Plan was for SBRT.  We discussed plans for restaging studies.  Labs on 08/09/2019 revealed hematocrit 41.6, hemoglobin 13.8, platelets 321,000, WBC 15,200 (ANC 11,000). Creatinine was 0.94. Potassium 3.4. Calcium 8.5. Magnesium 2.3.   Chest, abdomen and pelvis CT on 08/09/2019 revealed stable chest CT findings.  There was a 12 mm short axis left hilar lymph node probably stable since prior study although difficult to assess given lack of intravenous contrast on the earlier exams. Close attention on follow-up was recommended. There was no findings to suggest metastatic disease in the abdomen/pelvis. There was left colonic diverticulosis without diverticulitis. There was stable 2.5 cm cystic lesion left ovary, not hypermetabolic on previous PET imaging and likely benign.  She was seen by Dr Baruch Gouty on 08/10/2019.  She denied any focal neurologic deficits.  SRS 20 Gray in 1 fraction to her solitary brain metastasis was discussed.  Thin slice MRI scan for treatment planning purposes followed up by simulation was planned.  Decadron was tapered down to 4 mg po q 12 hours.  During the interim, she has  felt "fatigued".  She feels sick from not eating all day. She has a "little headache". She notes feeling tired and stressed from work. She reports that she was recently told she may lose her job.   Patient has some abdominal pain. She feels sick from medications.  Patient tolerates Carafate and Protonix. She wanted to go over all her medications. She is now on Decadron 4 mg BID.  I added all current medications to her medication list.   She is scheduled for a head MRI on 08/23/2019. She anticipates having stereotactic radiosurgery the 3rd week in 08/2019.  Her mask will be made on 08/29/2019.  BP is 154/80 today. She will be buying a blood pressure cuff to monitor her BP at home.    Past Medical History:  Diagnosis Date  . Cancer ([place]) 1989   cervical  . Depression   . Family history of breast cancer   . Family history of colon cancer   . Family history of ovarian cancer   . Family history of stomach cancer   . Family history of throat cancer   . Hyperlipidemia   . Hypertension   . Malignant neoplasm of lung ([place]) 03/01/2018  . Thyroid disease     Past Surgical History:  Procedure Laterality Date  . BILATERAL CARPAL TUNNEL RELEASE    . BREAST CYST ASPIRATION Left   . LUNG BIOPSY    . PARTIAL HYSTERECTOMY    . PORTA CATH INSERTION N/A 03/03/2018   Procedure: PORTA CATH INSERTION;  Surgeon: Katha Cabal, MD;  Location: [facility];  Service: Cardiovascular;  Laterality: N/A;  . TUBAL LIGATION      Family History  Problem Relation Age of Onset  . Colon cancer Mother 69  . Colon cancer Maternal Grandmother 78  . Hypertension Father   . Breast cancer Sister 52  . Throat cancer Brother   . Heart attack Maternal Aunt   . Ovarian cancer Maternal Aunt 70  . Stomach cancer Paternal Grandmother        dx >50  . Throat cancer Brother     Social History:  reports that she quit smoking about 20 months ago. Her smoking use included cigarettes. She has a 25.00 pack-year  smoking history. She has never used smokeless tobacco. She reports current drug use. Drug: Marijuana. She reports that she does not drink alcohol. 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 The patient is alone today.  Allergies: No Known Allergies  Current Medications: Current Outpatient Medications  Medication Sig Dispense Refill  . ALPRAZolam (XANAX) 0.25 MG tablet Take 1 tablet (0.25 mg total) by mouth at bedtime as needed. for anxiety 30 tablet 5  . aspirin EC 81 MG tablet Take 81 mg by mouth daily.    Marland Kitchen atorvastatin (LIPITOR) 10 MG tablet Take 1 tablet (10 mg total) by mouth daily. 90 tablet 1  . busPIRone (BUSPAR) 5 MG tablet Take 1 tablet (5 mg total) by mouth daily. 90 tablet 1  . clopidogrel (PLAVIX) 75 MG tablet Take 1 tablet (75 mg total) by mouth daily. 90 tablet 1  . dexamethasone (DECADRON) 4 MG tablet Take 1 tablet (4 mg total) by mouth 2 (two) times daily. 28 tablet 0  . FLUoxetine (PROZAC) 10 MG capsule Take 1 capsule (10 mg total) by mouth daily. 90 capsule 1  . hydrOXYzine (ATARAX/VISTARIL) 10 MG tablet Take 1 tablet (10 mg total) by mouth daily. 30 tablet 5  . levETIRAcetam (KEPPRA) 500 MG tablet Take 1 tablet (500 mg total) by mouth 2 (two) times daily. 60 tablet 2  . levothyroxine (SYNTHROID) 112 MCG tablet Take 1 tablet (112 mcg total) by mouth daily. 90 tablet 1  . lisinopril-hydrochlorothiazide (ZESTORETIC) 10-12.5 MG tablet Take 1 tablet by mouth daily. 90 tablet 1  . meclizine (ANTIVERT) 25 MG tablet Take 1 tablet (25 mg total) by mouth 3 (three) times daily as needed for dizziness. 30 tablet 0  . Multiple Vitamins-Minerals (MULTIVITAMIN ADULT PO) Take 1 tablet by mouth daily.     Marland Kitchen nystatin cream (MYCOSTATIN) Apply 1 application topically 2  (two) times daily. 30 g 0  . olopatadine (PATANOL) 0.1 % ophthalmic solution Place 1 drop into both eyes 2 (two) times daily. 5 mL 12  . pantoprazole (PROTONIX) 20 MG tablet Take 1 tablet (20 mg total) by mouth daily. 30 tablet 1  . sucralfate (CARAFATE) 1 g tablet Take 1 tablet (1 g total) by mouth 4 (four) times daily -  with meals and at bedtime. 30 tablet 1  . TAGRISSO 80 MG tablet TAKE 1 TABLET (80 MG TOTAL) BY MOUTH DAILY. 30 tablet 2  . triamcinolone cream (KENALOG) 0.1 % Apply 1 application topically 2 (two) times daily. 30 g 1  . umeclidinium-vilanterol (ANORO ELLIPTA) 62.5-25 MCG/INH AEPB Inhale 1 puff into the lungs daily. 60 each 5  . VENTOLIN HFA 108 (90 Base) MCG/ACT inhaler  Inhale 1-2 puffs into the lungs every 6 (six) hours as needed for wheezing or shortness of breath. 18 g 1   No current facility-administered medications for this visit.    Review of Systems  Constitutional: Positive for malaise/fatigue. Negative for chills, diaphoresis, fever and weight loss (up 5 lbs).       Feels "fatigued".  HENT: Negative for congestion, ear discharge, ear pain, hearing loss, nosebleeds, sinus pain, sore throat and tinnitus.   Eyes: Negative for blurred vision.  Respiratory: Negative for cough, hemoptysis, sputum production and shortness of breath (uses inhaler).   Cardiovascular: Negative for chest pain, palpitations and leg swelling.  Gastrointestinal: Positive for abdominal pain. Negative for blood in stool, constipation, diarrhea, heartburn, melena, nausea and vomiting.  Genitourinary: Negative for dysuria, frequency, hematuria and urgency.  Musculoskeletal: Negative for back pain, joint pain, myalgias and neck pain.  Skin: Negative for itching and rash.  Neurological: Positive for headaches (slight). Negative for dizziness, tingling, sensory change and weakness.  Endo/Heme/Allergies: Does not bruise/bleed easily.       Thyroid disease on Synthroid.  Psychiatric/Behavioral:  Negative for depression and memory loss. The patient is not nervous/anxious (takes Xanax occasionally) and does not have insomnia.        Stress form work. Tearful.  All other systems reviewed and are negative.   Performance status (ECOG): 1  Vitals Blood pressure (!) 154/80, pulse 74, temperature (!) 96.7 F (35.9 C), temperature source Tympanic, resp. rate 18, weight 219 lb 12.8 oz (99.7 kg), SpO2 100 %.   Physical Exam  Constitutional: She is oriented to person, place, and time. She appears well-developed and well-nourished. No distress.  HENT:  Head: Normocephalic and atraumatic.  Mouth/Throat: Oropharynx is clear and moist. No oropharyngeal exudate.  Shoulder length brown hair.  Eyes: Conjunctivae and EOM are normal. No scleral icterus.  Glasses.  Brown hair.  Mask.  Cardiovascular: Normal rate, regular rhythm and normal heart sounds.  No murmur heard. Pulmonary/Chest: Effort normal and breath sounds normal. No respiratory distress. She has no wheezes. She has no rales. She exhibits no tenderness.  Abdominal: Soft. Bowel sounds are normal. She exhibits no distension and no mass. There is no abdominal tenderness. There is no rebound and no guarding.  Musculoskeletal:        General: No tenderness or edema.  Neurological: She is alert and oriented to person, place, and time. No cranial nerve deficit. Coordination normal.  Skin: Skin is warm and dry. No rash noted. She is not diaphoretic. No erythema. No pallor.  Psychiatric: She has a normal mood and affect. Her behavior is normal. Judgment and thought content normal.  Tearful.  Nursing note and vitals reviewed.   Imaging studies: 02/09/2018:  PET scanrevealed an unusual pattern of nodal metastasis with intensely hypermetabolic enlarged precarinal lymph node, RIGHT hilar lymph node and RIGHT axillary lymph node. Recommend biopsy of the RIGHT axillarylymph node. There was no metabolic activity associated with the RIGHT lobe  pulmonary nodule. There was no additional evidence primary or metastatic carcinoma. Clinical stageT1N3M1. 03/10/2018:  Head MRIrevealed no evidence of metastatic disease. 11/24/2018:  Chest CTrevealed an enlarging left lower lobe pulmonary nodule measuring1.5 x 1.4 cm, concerning for progressive malignancy. There was mild diffuse bronchial wall thickening with mild centrilobular and paraseptal emphysema; imaging findings suggestive of underlying COPD. 12/29/2018:  PETscanrevealedan isolated 1.2 cm hypermetabolic LLL pulmonary nodule (SUV 8.0). There was no evidence of metastasis. Sherefused a biopsy.  04/20/2019:  ChestCTrevealed an interval reduction in volume of  theleftlower lobe nodule following stereotactic body radiation treatment. There was no new or progressive malignancy.   07/20/2018:  Chest CT revealed continued evolutionary changes secondary to external beam radiation involving the lingula and superior segment of left lower lobe. There was no specific findings to suggest residual or recurrence of tumor. 08/03/2019:  Head MRI revealed a 2.1 x 2.1 x 2.5 cm right frontal mass involving/abutting the lateral precentral gyrus likely reflecting a metastasis. There was significant surrounding edema with mild regional mass effect including subcentimeter leftward midline shift. There was no hydrocephalus. 08/09/2019:  Chest, abdomen and pelvis CT revealed stable chest CT findings.  There was a 12 mm short axis left hilar lymph node was probably stable since prior study although difficult to assess given lack of intravenous contrast on the earlier exams. Close attention on follow-up was recommended. There was no findings to suggest metastatic disease in the abdomen/pelvis. There was left colonic diverticulosis without diverticulitis. There was stable 2.5 cm cystic lesion left ovary, not hypermetabolic on previous PET imaging and likely benign.   Hospital Outpatient Visit on 08/09/2019    Component Date Value Ref Range Status  . Creatinine, Ser 08/09/2019 0.94  0.44 - 1.00 mg/dL Final  . GFR calc non Af Amer 08/09/2019 >60  >60 mL/min Final  . GFR calc Af Amer 08/09/2019 >60  >60 mL/min Final   Performed at [facility], [address], [place], [place] [address]  Infusion on 08/09/2019  Component Date Value Ref Range Status  . Magnesium 08/09/2019 2.3  1.7 - 2.4 mg/dL Final   Performed at [facility], [address], [place], [place] [address]  . WBC 08/09/2019 15.2* 4.0 - 10.5 K/uL Final  . RBC 08/09/2019 4.60  3.87 - 5.11 MIL/uL Final  . Hemoglobin 08/09/2019 13.8  12.0 - 15.0 g/dL Final  . HCT 08/09/2019 41.6  36.0 - 46.0 % Final  . MCV 08/09/2019 90.4  80.0 - 100.0 fL Final  . MCH 08/09/2019 30.0  26.0 - 34.0 pg Final  . MCHC 08/09/2019 33.2  30.0 - 36.0 g/dL Final  . RDW 08/09/2019 12.7  11.5 - 15.5 % Final  . Platelets 08/09/2019 321  150 - 400 K/uL Final  . nRBC 08/09/2019 0.0  0.0 - 0.2 % Final  . Neutrophils Relative % 08/09/2019 72  % Final  . Neutro Abs 08/09/2019 11.0* 1.7 - 7.7 K/uL Final  . Lymphocytes Relative 08/09/2019 19  % Final  . Lymphs Abs 08/09/2019 2.9  0.7 - 4.0 K/uL Final  . Monocytes Relative 08/09/2019 8  % Final  . Monocytes Absolute 08/09/2019 1.1* 0.1 - 1.0 K/uL Final  . Eosinophils Relative 08/09/2019 0  % Final  . Eosinophils Absolute 08/09/2019 0.0  0.0 - 0.5 K/uL Final  . Basophils Relative 08/09/2019 0  % Final  . Basophils Absolute 08/09/2019 0.0  0.0 - 0.1 K/uL Final  . Immature Granulocytes 08/09/2019 1  % Final  . Abs Immature Granulocytes 08/09/2019 0.12* 0.00 - 0.07 K/uL Final   Performed at [facility], [address], [place], [place] [address]  . Sodium 08/09/2019 136  135 - 145 mmol/L Final  . Potassium 08/09/2019 3.4* 3.5 - 5.1 mmol/L Final  . Chloride 08/09/2019 101  98 - 111 mmol/L Final  . CO2 08/09/2019 26  22 - 32 mmol/L Final  . Glucose, Bld 08/09/2019 94  70 - 99 mg/dL  Final   Glucose reference range applies only to samples taken  after fasting for at least 8 hours.  . BUN 08/09/2019 18  8 - 23 mg/dL Final  . Creatinine, Ser 08/09/2019 0.88  0.44 - 1.00 mg/dL Final  . Calcium 08/09/2019 8.5* 8.9 - 10.3 mg/dL Final  . Total Protein 08/09/2019 7.0  6.5 - 8.1 g/dL Final  . Albumin 08/09/2019 3.6  3.5 - 5.0 g/dL Final  . AST 08/09/2019 18  15 - 41 U/L Final  . ALT 08/09/2019 23  0 - 44 U/L Final  . Alkaline Phosphatase 08/09/2019 72  38 - 126 U/L Final  . Total Bilirubin 08/09/2019 0.6  0.3 - 1.2 mg/dL Final  . GFR calc non Af Amer 08/09/2019 >60  >60 mL/min Final  . GFR calc Af Amer 08/09/2019 >60  >60 mL/min Final  . Anion gap 08/09/2019 9  5 - 15 Final   Performed at [facility], [address], [place], [place] [address]    Assessment:  Kristina Wright is a 64 y.o. female with stage IV adenocarcinoma of the lungs/p right axillary biopsy on 02/24/2018. Pathologyrevealed metastatic adenocarcinoma c/w lung primary. Tumor cells were positive for CK7 and TTF-1 with minimal blush reactivity for CK20. Tumor cells were negative for GATA3, ER, and PR. Omniseq testingrevealed EGFR c.2582>A (L861Q) mutation and PDL-1 60% TPS. CEAwas 2.9 on 05/17/2018.  PET scanon 02/09/2018 revealed an unusual pattern of nodal metastasis with intensely hypermetabolic enlarged precarinal lymph node, RIGHT hilar lymph node and RIGHT axillary lymph node. Recommend biopsy of the RIGHT axillarylymph node. There was no metabolic activity associated with the RIGHT lobe pulmonary nodule. There was no additional evidence primary or metastatic carcinoma. Clinical stageT1N3M1.  Head MRIon 03/10/2018 revealed no evidence of metastatic disease.  PETscanon 12/29/2018 revealedan isolated 1.2 cm hypermetabolic LLL pulmonary nodule (SUV 8.0). There was no evidence of metastasis. Sherefused a biopsy.   Shereceived SBRT(6,000 cGy in 5 fractions) to the left lower  lobe nodulefrom 02/03/2019 - 02/28/2019.  Head MRI on 08/03/2019 revealed a 2.1 x 2.1 x 2.5 cm right frontal mass involving/abutting the lateral precentral gyrus likely reflecting a metastasis. There was significant surrounding edema with mild regional mass effect including subcentimeter leftward midline shift. There was no hydrocephalus.  Chest, abdomen and pelvis CT on 08/09/2019 revealed stable chest CT findings.  There was a 12 mm short axis left hilar lymph node was probably stable since prior study although difficult to assess given lack of intravenous contrast on the earlier exams. Close attention on follow-up was recommended. There was no findings to suggest metastatic disease in the abdomen/pelvis. There was left colonic diverticulosis without diverticulitis. There was stable 2.5 cm cystic lesion left ovary, not hypermetabolic on previous PET imaging and likely benign.  She began osimertinibon 03/25/2018.Echoon 07/27/2018 showed an ejection fraction in the range of 60-65%. She is tolerating treatment well.  Diagnostic left mammogramon 11/01/2018 revealed a benign left breast mass unchanged mammographically since at least 2016. There were no suspicious findings in the left breast.  She has a history of cervical cancerin 1989. She underwent cryotherapy.  Shereceivedher second dose of theCOVID-19vaccineon 07/13/2019.   Symptomatically, she is fatigued.  She notes "a little headache", but denies any focal numbness or weakness.  Plan: 1.   Review labs from 08/09/2019. 2.   Stage IV adenocarcinoma of the Lung (EGFR L861Q mutation) Clinically,she is doing fair. PET scan on 12/29/2018 revealed an isolated 1.2 cm LLL pulmonary nodule. She received SBRT (completed 02/28/2019).  Chest, abdomen, and pelvis CT on 08/09/2019 was personally reviewed.  Agree with radiology interpretation.   There is no evidence  of metastatic disease. Head MRI confirmed an isolated CNS metastasis on 08/03/2019.  Continue osimertinib 80 mg a day. 3.Right frontal CNS mass Head MRI on 08/03/2019 revealed a 2.5 cm isolated right frontal mass with associated edema.  She remains on Decadron 4 mg BID to decrease edema.  She is being scheduled for SRS with Dr Baruch Gouty.  Decadron will be tapered by Dr Baruch Gouty.  Continue Keppra. 4.   GI upset  Suspect etiology secondary to Decadron.  Discuss need for GI prophylaxis.  Review plan for Carafate rather than Protonix.  Protonix can decrease the serum concentration of Plavix. 5.   Refill Decadron 4 mg po BID (dis: #28 with 1 refill). 6.   RTC in 1 month for MD assessment.  I discussed the assessment and treatment plan with the patient.  The patient was provided an opportunity to ask questions and all were answered.  The patient agreed with the plan and demonstrated an understanding of the instructions.  The patient was advised to call back if the symptoms worsen or if the condition fails to improve as anticipated.  I provided 20 minutes of face-to-face time during this this encounter and > 50% was spent counseling as documented under my assessment and plan.    Lequita Asal, MD, PhD    08/11/2019, 3:53 PM  I, Selena Batten, am acting as scribe for [employer]. Mike Gip, MD, PhD.  I, Radwan Cowley C. Mike Gip, MD, have reviewed the above documentation for accuracy and completeness, and I agree with the above.

## 2019-08-09 NOTE — Progress Notes (Signed)
Patient in the [facility] to get labs prior to CT.  Lab requested we come stick port for IV access and labs.  Patient is to have a MD visit tomorrow with labs in the [place].  So, port accessed, labs drawn for CT and for MD appointment and port flushed.

## 2019-08-10 ENCOUNTER — Ambulatory Visit
Admission: RE | Admit: 2019-08-10 | Discharge: 2019-08-10 | Disposition: A | Discharge status: Home / Self Care | Payer: BLUE CROSS/BLUE SHIELD | Source: Ambulatory Visit | Attending: Radiation Oncology | Admitting: Radiation Oncology

## 2019-08-10 ENCOUNTER — Encounter: Payer: Self-pay | Admitting: Radiation Oncology

## 2019-08-10 ENCOUNTER — Encounter: Payer: Self-pay | Admitting: Hematology and Oncology

## 2019-08-10 ENCOUNTER — Other Ambulatory Visit: Payer: Self-pay

## 2019-08-10 ENCOUNTER — Other Ambulatory Visit: Payer: Self-pay | Admitting: *Deleted

## 2019-08-10 VITALS — BP 160/84 | HR 75 | Temp 98.2°F | Resp 16 | Wt 221.6 lb

## 2019-08-10 DIAGNOSIS — Z87891 Personal history of nicotine dependence: Secondary | ICD-10-CM | POA: Diagnosis not present

## 2019-08-10 DIAGNOSIS — C7931 Secondary malignant neoplasm of brain: Secondary | ICD-10-CM

## 2019-08-10 DIAGNOSIS — Z923 Personal history of irradiation: Secondary | ICD-10-CM | POA: Insufficient documentation

## 2019-08-10 DIAGNOSIS — C3432 Malignant neoplasm of lower lobe, left bronchus or lung: Secondary | ICD-10-CM | POA: Insufficient documentation

## 2019-08-10 NOTE — Progress Notes (Signed)
No new changes noted today. The patient Name and DOB has been verified by phone today. 

## 2019-08-10 NOTE — Progress Notes (Signed)
Radiation Oncology Follow up Note old patient new area solitary brain metastasis  Name: Kristina Wright   Date:   08/10/2019 MRN:  [id number] DOB: February 14, 1956    This 64 y.o. female presents to the clinic today for evaluation of solitary brain metastasis and patient with known stage IV adenocarcinoma of the lung.  REFERRING PROVIDER: Juline Patch, MD  HPI: Patient is a 64 year old female well-known to our department having previously completed SBRT treatment to her left lower lobe in a patient with known stage IV adenocarcinoma the lung..  She is asymptomatic and is currently onosimertinib.  She recently had an MRI scan early April showing a right frontal mass with significant surrounding edema consistent with metastatic site.  Repeats scans of her chest abdomen pelvis yesterday showing no new or progressive findings.  She is having no headaches although she has been started on Decadron she specifically denies any focal neurologic deficits or change in visual fields.  She is referred today to radiation oncology for consideration of treatment.  COMPLICATIONS OF TREATMENT: none  FOLLOW UP COMPLIANCE: keeps appointments   PHYSICAL EXAM:  BP (!) 160/84 (BP Location: Left Arm, Patient Position: Sitting, Cuff Size: Normal)   Pulse 75   Temp 98.2 F (36.8 C) (Tympanic)   Resp 16   Wt 221 lb 9.6 oz (100.5 kg)   BMI 32.72 kg/m  Well-developed well-nourished patient in NAD. HEENT reveals PERLA, EOMI, discs not visualized.  Oral cavity is clear. No oral mucosal lesions are identified. Neck is clear without evidence of cervical or supraclavicular adenopathy. Lungs are clear to A&P. Cardiac examination is essentially unremarkable with regular rate and rhythm without murmur rub or thrill. Abdomen is benign with no organomegaly or masses noted. Motor sensory and DTR levels are equal and symmetric in the upper and lower extremities. Cranial nerves II through XII are grossly intact. Proprioception is intact.  No peripheral adenopathy or edema is identified. No motor or sensory levels are noted. Crude visual fields are within normal range.  RADIOLOGY RESULTS: CT scan chest abdomen pelvis as well as MRI scan of brain reviewed compatible with above-stated findings  PLAN: At this time I have offered SRS 20 Gray in 1 fraction to her solitary brain metastasis.  I have ordered an thin slice MRI scan for treatment planning purposes followed up by simulation.  Risks and benefits of treatment occluding hair loss fatigue possible alteration of blood counts slight chance of brain necrosis all were discussed in detail with the patient.  She seems to comprehend my treatment plan well.  I have personally set up her for MRI scan and simulation to follow.  I have also tapered her Decadron down to 4 mg twice a day.  She will continue on Protonix.  Patient comprehends my treatment plan well.  I would like to take this opportunity to thank you for allowing me to participate in the care of your patient.Noreene Filbert, MD

## 2019-08-11 ENCOUNTER — Encounter: Payer: Self-pay | Admitting: Hematology and Oncology

## 2019-08-11 ENCOUNTER — Inpatient Hospital Stay: Payer: 59

## 2019-08-11 ENCOUNTER — Inpatient Hospital Stay (HOSPITAL_BASED_OUTPATIENT_CLINIC_OR_DEPARTMENT_OTHER): Payer: 59 | Admitting: Hematology and Oncology

## 2019-08-11 ENCOUNTER — Telehealth: Payer: Self-pay | Admitting: *Deleted

## 2019-08-11 VITALS — BP 154/80 | HR 74 | Temp 96.7°F | Resp 18 | Wt 219.8 lb

## 2019-08-11 DIAGNOSIS — C7931 Secondary malignant neoplasm of brain: Secondary | ICD-10-CM

## 2019-08-11 DIAGNOSIS — C801 Malignant (primary) neoplasm, unspecified: Secondary | ICD-10-CM | POA: Diagnosis not present

## 2019-08-11 DIAGNOSIS — C7800 Secondary malignant neoplasm of unspecified lung: Secondary | ICD-10-CM | POA: Diagnosis not present

## 2019-08-11 DIAGNOSIS — C3432 Malignant neoplasm of lower lobe, left bronchus or lung: Secondary | ICD-10-CM | POA: Diagnosis not present

## 2019-08-11 DIAGNOSIS — R519 Headache, unspecified: Secondary | ICD-10-CM | POA: Diagnosis not present

## 2019-08-11 DIAGNOSIS — Z452 Encounter for adjustment and management of vascular access device: Secondary | ICD-10-CM | POA: Diagnosis not present

## 2019-08-11 NOTE — Telephone Encounter (Signed)
Patient contacted to assure understanding of education and treatment plan. Patient verbalized understanding and was able to verify future appointment. Patient encouraged to contact Radiation Oncology Department should questions arise.

## 2019-08-12 ENCOUNTER — Other Ambulatory Visit: Payer: Self-pay

## 2019-08-12 DIAGNOSIS — C801 Malignant (primary) neoplasm, unspecified: Secondary | ICD-10-CM

## 2019-08-12 DIAGNOSIS — C7800 Secondary malignant neoplasm of unspecified lung: Secondary | ICD-10-CM

## 2019-08-12 MED ORDER — DEXAMETHASONE 4 MG PO TABS: 4.0000 mg | ORAL_TABLET | Freq: Two times a day (BID) | ORAL | 1 refills | Status: DC

## 2019-08-12 MED FILL — TAGRISSO 80 MG TABLET: 80 | 30 days supply | Qty: 30 | Fill #2

## 2019-08-15 ENCOUNTER — Inpatient Hospital Stay: Payer: 59

## 2019-08-15 ENCOUNTER — Telehealth: Payer: Self-pay

## 2019-08-15 NOTE — Telephone Encounter (Signed)
Left a message To inform the patient that the decadron was sent to the phamacy on 08/12/2019 and it was placed on hold/ unknow reason. The medication is ready to be picked up from the pharmacy.

## 2019-08-23 ENCOUNTER — Ambulatory Visit: Admission: RE | Admit: 2019-08-23 | Payer: BLUE CROSS/BLUE SHIELD | Source: Ambulatory Visit

## 2019-08-23 ENCOUNTER — Telehealth: Payer: Self-pay

## 2019-08-23 NOTE — Telephone Encounter (Signed)
I have provided a letter for the patient to return to work for 25 hours. I have informed the patient to pick up at the front desk.The patient was understanding and agreeable.

## 2019-08-29 ENCOUNTER — Encounter: Payer: Self-pay | Admitting: *Deleted

## 2019-08-29 ENCOUNTER — Ambulatory Visit: Payer: 59

## 2019-08-31 ENCOUNTER — Other Ambulatory Visit: Payer: Self-pay

## 2019-08-31 ENCOUNTER — Ambulatory Visit: Admission: RE | Admit: 2019-08-31 | Payer: 59 | Source: Ambulatory Visit

## 2019-08-31 ENCOUNTER — Ambulatory Visit
Admission: RE | Admit: 2019-08-31 | Discharge: 2019-08-31 | Disposition: A | Discharge status: Home / Self Care | Payer: 59 | Source: Ambulatory Visit | Attending: Radiation Oncology | Admitting: Radiation Oncology

## 2019-08-31 DIAGNOSIS — C7931 Secondary malignant neoplasm of brain: Secondary | ICD-10-CM | POA: Diagnosis not present

## 2019-08-31 MED ORDER — GADOBUTROL 1 MMOL/ML IV SOLN
9.0000 mL | Freq: Once | INTRAVENOUS | Status: AC | PRN
  Administered 2019-08-31: 09:00:00 9 mL via INTRAVENOUS

## 2019-09-01 ENCOUNTER — Ambulatory Visit: Payer: 59

## 2019-09-01 ENCOUNTER — Telehealth: Payer: Self-pay | Admitting: *Deleted

## 2019-09-01 ENCOUNTER — Other Ambulatory Visit: Payer: Self-pay

## 2019-09-01 ENCOUNTER — Inpatient Hospital Stay: Payer: 59 | Attending: Oncology | Admitting: Oncology

## 2019-09-01 VITALS — BP 170/81 | HR 97 | Temp 97.0°F | Resp 20 | Wt 228.0 lb

## 2019-09-01 DIAGNOSIS — I10 Essential (primary) hypertension: Secondary | ICD-10-CM

## 2019-09-01 DIAGNOSIS — Z7952 Long term (current) use of systemic steroids: Secondary | ICD-10-CM | POA: Insufficient documentation

## 2019-09-01 DIAGNOSIS — B029 Zoster without complications: Secondary | ICD-10-CM | POA: Insufficient documentation

## 2019-09-01 DIAGNOSIS — M7989 Other specified soft tissue disorders: Secondary | ICD-10-CM | POA: Insufficient documentation

## 2019-09-01 DIAGNOSIS — C3432 Malignant neoplasm of lower lobe, left bronchus or lung: Secondary | ICD-10-CM | POA: Insufficient documentation

## 2019-09-01 DIAGNOSIS — S9781XA Crushing injury of right foot, initial encounter: Secondary | ICD-10-CM | POA: Insufficient documentation

## 2019-09-01 DIAGNOSIS — C801 Malignant (primary) neoplasm, unspecified: Secondary | ICD-10-CM

## 2019-09-01 DIAGNOSIS — K1379 Other lesions of oral mucosa: Secondary | ICD-10-CM | POA: Diagnosis not present

## 2019-09-01 DIAGNOSIS — Z79899 Other long term (current) drug therapy: Secondary | ICD-10-CM | POA: Insufficient documentation

## 2019-09-01 DIAGNOSIS — C7931 Secondary malignant neoplasm of brain: Secondary | ICD-10-CM | POA: Insufficient documentation

## 2019-09-01 DIAGNOSIS — R2242 Localized swelling, mass and lump, left lower limb: Secondary | ICD-10-CM | POA: Diagnosis not present

## 2019-09-01 DIAGNOSIS — L03019 Cellulitis of unspecified finger: Secondary | ICD-10-CM | POA: Insufficient documentation

## 2019-09-01 DIAGNOSIS — C7800 Secondary malignant neoplasm of unspecified lung: Secondary | ICD-10-CM

## 2019-09-01 DIAGNOSIS — Z923 Personal history of irradiation: Secondary | ICD-10-CM | POA: Diagnosis not present

## 2019-09-01 DIAGNOSIS — Z9221 Personal history of antineoplastic chemotherapy: Secondary | ICD-10-CM | POA: Diagnosis not present

## 2019-09-01 MED ORDER — LISINOPRIL-HYDROCHLOROTHIAZIDE 20-25 MG PO TABS: 1.0000 | ORAL_TABLET | Freq: Every day | ORAL | 0 refills | Status: DC

## 2019-09-01 NOTE — Telephone Encounter (Signed)
Let me know if patient needs to be seen.  [name], NP 09/01/2019 9:57 AM

## 2019-09-01 NOTE — Progress Notes (Signed)
Symptom Management Consult note [facility]  [phone] [phone]  Patient Care Team: Juline Patch, MD as PCP - General (Family Medicine) Lequita Asal, MD as Referring Physician (Hematology and Oncology) Delana Meyer, Dolores Lory, MD (Vascular Surgery) Noreene Filbert, MD as Radiation Oncologist (Radiation Oncology)   Name of the patient: Kristina Wright  [id number]  1955-09-17   Date of visit: 09/01/2019   Diagnosis- Lung Cancer   Chief complaint/ Reason for visit- sore mouth/throat and swelling in lower extremities  Heme/Onc history:  Oncology History  Malignant neoplasm of lung ([place])  03/01/2018 Initial Diagnosis   Malignant neoplasm of lung ([place])   03/12/2018 Cancer Staging   Staging form: Lung, AJCC 8th Edition - Clinical stage from 03/12/2018: Stage IV (cT1, cN3, pM1) - Signed by Earlie Server, MD on 03/12/2018   03/15/2018 - 03/15/2018 Chemotherapy   The patient had palonosetron (ALOXI) injection 0.25 mg, 0.25 mg, Intravenous,  Once, 0 of 4 cycles PEMEtrexed (ALIMTA) 1,000 mg in sodium chloride 0.9 % 100 mL chemo infusion, 500 mg/m2, Intravenous,  Once, 0 of 6 cycles CARBOplatin (PARAPLATIN) in sodium chloride 0.9 % 100 mL chemo infusion, , Intravenous,  Once, 0 of 4 cycles pembrolizumab (KEYTRUDA) 200 mg in sodium chloride 0.9 % 50 mL chemo infusion, 200 mg, Intravenous, Once, 0 of 6 cycles fosaprepitant (EMEND) 150 mg, dexamethasone (DECADRON) 12 mg in sodium chloride 0.9 % 145 mL IVPB, , Intravenous,  Once, 0 of 4 cycles  for chemotherapy treatment.     Interval history-Mrs. Londo is a 64 year old female with past medical history for depression, cervical cancer, hypertension, hyperlipidemia and treated with SBRT for left lower lobe lung cancer (completed 02/28/2019).  She is followed by Dr. Mike Gip and is currently on osimertinib.  CT chest from 07/20/2019 looked fairly stable.  She reported a headache that began since  Covid-19 vaccine.   She presented to the emergency room on 08/03/2019 for CVA-like symptoms and headache.  MRI of the brain showed a right frontal metastasis with surrounding edema.  She was discharged on Keppra and Decadron.  She met with Dr. Donella Stade and plan is for Stereotactic radiosurgery next week.  Patient called clinic this morning expressing concerns for thrush she has developed in her mouth and swelling she has developed in her lower extremities. Symptoms have been present for about 1-2 weeks.  Over the past several days, she has noticed more sensitivity in her mouth and throat with swallowing.  She denies any visible sores.  She also notes changes in taste.  Lower extremity edema began shortly after initiating her Decadron.  She was instructed to take an over-the-counter diuretic which has not offered any relief.  She also notes an increase in her blood pressures over the past several weeks.  Typical blood pressures at home are anywhere between 646-803 systolically over 21Y.  She is currently taking lisinopril HCTZ 10 mg / 12.5 mg.  She denies any recent fevers or illness, chest pain, nausea, vomiting, constipation or diarrhea.  ECOG FS:1 - Symptomatic but completely ambulatory  Review of systems- Review of Systems  Constitutional: Negative.  Negative for chills, fever, malaise/fatigue and weight loss.  HENT: Positive for sore throat. Negative for congestion, ear pain and tinnitus.        Mouth pain  Eyes: Negative.  Negative for blurred vision and double vision.  Respiratory: Negative.  Negative for cough, sputum production and shortness of breath.   Cardiovascular: Positive for leg swelling. Negative for  chest pain and palpitations.  Gastrointestinal: Negative.  Negative for abdominal pain, constipation, diarrhea, nausea and vomiting.  Genitourinary: Negative for dysuria, frequency and urgency.  Musculoskeletal: Negative for back pain and falls.  Skin: Negative.  Negative for rash.    Neurological: Negative.  Negative for weakness and headaches.  Endo/Heme/Allergies: Negative.  Does not bruise/bleed easily.  Psychiatric/Behavioral: Positive for depression. The patient is nervous/anxious. The patient does not have insomnia.      Current treatment- Tagrisso  No Known Allergies   Past Medical History:  Diagnosis Date  . Cancer ([place]) 1989   cervical  . Depression   . Family history of breast cancer   . Family history of colon cancer   . Family history of ovarian cancer   . Family history of stomach cancer   . Family history of throat cancer   . Hyperlipidemia   . Hypertension   . Malignant neoplasm of lung ([place]) 03/01/2018  . Thyroid disease      Past Surgical History:  Procedure Laterality Date  . BILATERAL CARPAL TUNNEL RELEASE    . BREAST CYST ASPIRATION Left   . LUNG BIOPSY    . PARTIAL HYSTERECTOMY    . PORTA CATH INSERTION N/A 03/03/2018   Procedure: PORTA CATH INSERTION;  Surgeon: Katha Cabal, MD;  Location: [facility];  Service: Cardiovascular;  Laterality: N/A;  . TUBAL LIGATION      Social History   Socioeconomic History  . Marital status: Single    Spouse name: Not on file  . Number of children: 3  . Years of education: Not on file  . Highest education level: Not on file  Occupational History  . Not on file  Tobacco Use  . Smoking status: Former Smoker    Packs/day: 1.00    Years: 25.00    Pack years: 25.00    Types: Cigarettes    Quit date: 11/19/2017    Years since quitting: 1.7  . Smokeless tobacco: Never Used  Substance and Sexual Activity  . Alcohol use: No  . Drug use: Yes    Types: Marijuana    Comment: occasional  . Sexual activity: Not on file  Other Topics Concern  . Not on file  Social History Narrative  . Not on file   Social Determinants of Health   Financial Resource Strain:   . Difficulty of Paying Living Expenses:   Food Insecurity:   . Worried About Charity fundraiser in the Last  Year:   . Arboriculturist in the Last Year:   Transportation Needs:   . Film/video editor (Medical):   Marland Kitchen Lack of Transportation (Non-Medical):   Physical Activity:   . Days of Exercise per Week:   . Minutes of Exercise per Session:   Stress:   . Feeling of Stress :   Social Connections:   . Frequency of Communication with Friends and Family:   . Frequency of Social Gatherings with Friends and Family:   . Attends Religious Services:   . Active Member of Clubs or Organizations:   . Attends Archivist Meetings:   Marland Kitchen Marital Status:   Intimate Partner Violence:   . Fear of Current or Ex-Partner:   . Emotionally Abused:   Marland Kitchen Physically Abused:   . Sexually Abused:     Family History  Problem Relation Age of Onset  . Colon cancer Mother 27  . Colon cancer Maternal Grandmother 78  . Hypertension Father   .  Breast cancer Sister 39  . Throat cancer Brother   . Heart attack Maternal Aunt   . Ovarian cancer Maternal Aunt 70  . Stomach cancer Paternal Grandmother        dx >50  . Throat cancer Brother      Current Outpatient Medications:  .  ALPRAZolam (XANAX) 0.25 MG tablet, Take 1 tablet (0.25 mg total) by mouth at bedtime as needed. for anxiety, Disp: 30 tablet, Rfl: 5 .  aspirin EC 81 MG tablet, Take 81 mg by mouth daily., Disp: , Rfl:  .  atorvastatin (LIPITOR) 10 MG tablet, Take 1 tablet (10 mg total) by mouth daily., Disp: 90 tablet, Rfl: 1 .  busPIRone (BUSPAR) 5 MG tablet, Take 1 tablet (5 mg total) by mouth daily., Disp: 90 tablet, Rfl: 1 .  clopidogrel (PLAVIX) 75 MG tablet, Take 1 tablet (75 mg total) by mouth daily., Disp: 90 tablet, Rfl: 1 .  dexamethasone (DECADRON) 4 MG tablet, Take 1 tablet (4 mg total) by mouth 2 (two) times daily., Disp: 28 tablet, Rfl: 1 .  FLUoxetine (PROZAC) 10 MG capsule, Take 1 capsule (10 mg total) by mouth daily., Disp: 90 capsule, Rfl: 1 .  hydrOXYzine (ATARAX/VISTARIL) 10 MG tablet, Take 1 tablet (10 mg total) by mouth  daily., Disp: 30 tablet, Rfl: 5 .  levETIRAcetam (KEPPRA) 500 MG tablet, Take 1 tablet (500 mg total) by mouth 2 (two) times daily., Disp: 60 tablet, Rfl: 2 .  levothyroxine (SYNTHROID) 112 MCG tablet, Take 1 tablet (112 mcg total) by mouth daily., Disp: 90 tablet, Rfl: 1 .  meclizine (ANTIVERT) 25 MG tablet, Take 1 tablet (25 mg total) by mouth 3 (three) times daily as needed for dizziness., Disp: 30 tablet, Rfl: 0 .  Multiple Vitamins-Minerals (MULTIVITAMIN ADULT PO), Take 1 tablet by mouth daily. , Disp: , Rfl:  .  nystatin cream (MYCOSTATIN), Apply 1 application topically 2 (two) times daily., Disp: 30 g, Rfl: 0 .  olopatadine (PATANOL) 0.1 % ophthalmic solution, Place 1 drop into both eyes 2 (two) times daily., Disp: 5 mL, Rfl: 12 .  pantoprazole (PROTONIX) 20 MG tablet, Take 1 tablet (20 mg total) by mouth daily., Disp: 30 tablet, Rfl: 1 .  sucralfate (CARAFATE) 1 g tablet, Take 1 tablet (1 g total) by mouth 4 (four) times daily -  with meals and at bedtime., Disp: 30 tablet, Rfl: 1 .  TAGRISSO 80 MG tablet, TAKE 1 TABLET (80 MG TOTAL) BY MOUTH DAILY., Disp: 30 tablet, Rfl: 2 .  triamcinolone cream (KENALOG) 0.1 %, Apply 1 application topically 2 (two) times daily., Disp: 30 g, Rfl: 1 .  umeclidinium-vilanterol (ANORO ELLIPTA) 62.5-25 MCG/INH AEPB, Inhale 1 puff into the lungs daily., Disp: 60 each, Rfl: 5 .  VENTOLIN HFA 108 (90 Base) MCG/ACT inhaler, Inhale 1-2 puffs into the lungs every 6 (six) hours as needed for wheezing or shortness of breath., Disp: 18 g, Rfl: 1 .  lidocaine (XYLOCAINE) 2 % solution, Use as directed 15 mLs in the mouth or throat as needed for mouth pain., Disp: 100 mL, Rfl: 0 .  lisinopril-hydrochlorothiazide (ZESTORETIC) 20-25 MG tablet, Take 1 tablet by mouth daily., Disp: 90 tablet, Rfl: 0  Physical exam:  Vitals:   09/01/19 1516 09/01/19 1517  BP:  (!) 170/81  Pulse:  97  Resp:  20  Temp:  (!) 97 F (36.1 C)  TempSrc:  Tympanic  SpO2:  98%  Weight: 228 lb  (103.4 kg)    Physical Exam Constitutional:  Appearance: Normal appearance.  HENT:     Head: Normocephalic and atraumatic.     Mouth/Throat:     Mouth: Mucous membranes are moist.     Pharynx: Posterior oropharyngeal erythema present.     Comments: No plaque or coating-no visible sores Eyes:     Pupils: Pupils are equal, round, and reactive to light.  Cardiovascular:     Rate and Rhythm: Normal rate and regular rhythm.     Heart sounds: Normal heart sounds. No murmur.  Pulmonary:     Effort: Pulmonary effort is normal.     Breath sounds: Normal breath sounds. No wheezing.  Abdominal:     General: Bowel sounds are normal. There is no distension.     Palpations: Abdomen is soft.     Tenderness: There is no abdominal tenderness.  Musculoskeletal:        General: Swelling (non pitting) present. Normal range of motion.     Cervical back: Normal range of motion.  Skin:    General: Skin is warm and dry.     Findings: No rash.  Neurological:     Mental Status: She is alert and oriented to person, place, and time.  Psychiatric:        Judgment: Judgment normal.      CMP Latest Ref Rng & Units 08/09/2019  Glucose 70 - 99 mg/dL 94  BUN 8 - 23 mg/dL 18  Creatinine 0.44 - 1.00 mg/dL 0.88  Sodium 135 - 145 mmol/L 136  Potassium 3.5 - 5.1 mmol/L 3.4(L)  Chloride 98 - 111 mmol/L 101  CO2 22 - 32 mmol/L 26  Calcium 8.9 - 10.3 mg/dL 8.5(L)  Total Protein 6.5 - 8.1 g/dL 7.0  Total Bilirubin 0.3 - 1.2 mg/dL 0.6  Alkaline Phos 38 - 126 U/L 72  AST 15 - 41 U/L 18  ALT 0 - 44 U/L 23   CBC Latest Ref Rng & Units 08/09/2019  WBC 4.0 - 10.5 K/uL 15.2(H)  Hemoglobin 12.0 - 15.0 g/dL 13.8  Hematocrit 36.0 - 46.0 % 41.6  Platelets 150 - 400 K/uL 321    No images are attached to the encounter.  CT HEAD WO CONTRAST  Result Date: 08/03/2019 CLINICAL DATA:  Transit ischemic attack. EXAM: CT HEAD WITHOUT CONTRAST TECHNIQUE: Contiguous axial images were obtained from the base of the  skull through the vertex without intravenous contrast. COMPARISON:  03/10/2018 MRI head. FINDINGS: Brain: There is a peripherally hyperdense 2.0 x 1.9 x 2.5 cm right frontal lesion (2:18, 4:25), new since prior exam. New right frontal/perilesional vasogenic edema likely involving the right basal ganglia, posterior limb of the right internal capsule and thalamus. Partial effacement of the right lateral ventricle with 4 mm leftward midline shift (2:17). No ventriculomegaly. No extra-axial fluid collection. Vascular: No hyperdense vessel. Bilateral carotid siphon atherosclerotic calcifications. Skull: Negative for fracture or focal lesion. Sinuses/Orbits: Normal orbits. Clear paranasal sinuses. No mastoid effusion. Other: None. IMPRESSION: Peripherally hyperdense 2.5 cm right frontal lesion is concerning for hemorrhagic metastases versus primary neoplasm. Further evaluation with MRI brain with and without contrast is recommended. Right frontal perilesional edema involving the right basal ganglia and thalamus. Partial right lateral ventricle effacement with 4 mm leftward midline shift. These results were called by telephone at the time of interpretation on 08/03/2019 at 1:22 pm to provider Blake Divine , who verbally acknowledged these results. Electronically Signed   By: Primitivo Gauze M.D.   On: 08/03/2019 13:26   CT CHEST W CONTRAST  Result  Date: 08/09/2019 CLINICAL DATA:  Metastatic lung cancer. EXAM: CT CHEST, ABDOMEN, AND PELVIS WITH CONTRAST TECHNIQUE: Multidetector CT imaging of the chest, abdomen and pelvis was performed following the standard protocol during bolus administration of intravenous contrast. CONTRAST:  165m OMNIPAQUE IOHEXOL 300 MG/ML  SOLN COMPARISON:  CT chest 07/20/2019. PET-CT 12/29/2018. FINDINGS: CT CHEST FINDINGS Cardiovascular: The heart size is normal. No substantial pericardial effusion. Atherosclerotic calcification is noted in the wall of the thoracic aorta. Right Port-A-Cath  tip is positioned in the upper right atrium. Mediastinum/Nodes: No mediastinal lymphadenopathy. 12 mm short axis left hilar node visible on 31/2. Probably present on prior study although difficult to assess given lack of intravenous contrast on the earlier exams. No right hilar lymphadenopathy. Tiny hiatal hernia. The esophagus has normal imaging features. There is no axillary lymphadenopathy. Lungs/Pleura: Bandlike interstitial and airspace opacity in the lateral left upper and lower lobes is similar to prior although the collapse/consolidative opacity in the lingula has decreased slightly in the interval. Imaging features likely reflect sequelae of radiation therapy. No suspicious pulmonary nodule or mass. No pleural effusion. Musculoskeletal: No worrisome lytic or sclerotic osseous abnormality. CT ABDOMEN PELVIS FINDINGS Hepatobiliary: No suspicious focal abnormality within the liver parenchyma. There is no evidence for gallstones, gallbladder wall thickening, or pericholecystic fluid. No intrahepatic or extrahepatic biliary dilation. Pancreas: No focal mass lesion. No dilatation of the main duct. No intraparenchymal cyst. No peripancreatic edema. Spleen: No splenomegaly. No focal mass lesion. Adrenals/Urinary Tract: No adrenal nodule or mass. Kidneys unremarkable. No evidence for hydroureter. The urinary bladder appears normal for the degree of distention. Stomach/Bowel: Tiny hiatal hernia. Stomach is unremarkable. No gastric wall thickening. No evidence of outlet obstruction. Duodenum is normally positioned as is the ligament of Treitz. Duodenal diverticulum evident. No small bowel wall thickening. No small bowel dilatation. The terminal ileum is normal. The appendix is normal. No gross colonic mass. No colonic wall thickening. Diverticular changes are noted in the left colon without evidence of diverticulitis. Vascular/Lymphatic: There is abdominal aortic atherosclerosis without aneurysm. There is no  gastrohepatic or hepatoduodenal ligament lymphadenopathy. No retroperitoneal or mesenteric lymphadenopathy. No pelvic sidewall lymphadenopathy. Reproductive: The uterus is unremarkable. 2.5 cm cystic lesion left ovary is stable since PET-CT 12/29/2018. Other: No intraperitoneal free fluid. Musculoskeletal: No worrisome lytic or sclerotic osseous abnormality. IMPRESSION: 1. Chest CT stable since 07/20/2019 without new or progressive findings. 2. 12 mm short axis left hilar lymph node is probably stable since prior study although difficult to assess given lack of intravenous contrast on the earlier exams. Close attention on follow-up recommended. 3. No findings to suggest metastatic disease in the abdomen/pelvis. 4. Left colonic diverticulosis without diverticulitis. 5. Stable 2.5 cm cystic lesion left ovary, not hypermetabolic on previous PET imaging and likely benign. 6. Aortic Atherosclerosis (ICD10-I70.0). Electronically Signed   By: EMisty StanleyM.D.   On: 08/09/2019 16:54   MR Brain W Wo Contrast  Result Date: 08/31/2019 CLINICAL DATA:  64year old female with lung cancer and solitary right frontal metastasis by MRI last month. SRS treatment planning. EXAM: MRI HEAD WITHOUT AND WITH CONTRAST TECHNIQUE: Multiplanar, multiecho pulse sequences of the brain and surrounding structures were obtained without and with intravenous contrast. CONTRAST:  951mGADAVIST GADOBUTROL 1 MMOL/ML IV SOLN COMPARISON:  Brain MRI 08/03/2019. brain MRI 03/10/2018. FINDINGS: BRAIN Lobulated and enhancing peripherally located mass at the frontal operculum now measures up to 28 mm diameter and is inseparable from the overlying dura with mild adjacent dural thickening as before. However, this  does appear to be intra-axial, with no definite cleft visible on T2 imaging. Regional T2 and FLAIR hyperintensity in keeping with vasogenic edema has mildly regressed. No midline shift. Mildly improved patency of the right lateral ventricle since  last month. No other enhancing brain lesion or cerebral edema. No other dural thickening. Anterior left frontal lobe developmental venous anomaly (normal variant). Other Brain findings: No restricted diffusion to suggest acute infarction. No ventriculomegaly, extra-axial collection or acute intracranial hemorrhage. Cervicomedullary junction and pituitary are within normal limits. Axial images raise the possibility of bilateral IAC fundus enhancement (such as series 14, image 59 on the right), but this is stable since 2019 and also not definitely correlated on the coronal or sagittal postcontrast images. Vascular: Major intracranial vascular flow voids are stable. The major dural venous sinuses are enhancing and appear to be patent. Skull and upper cervical spine: Negative visible cervical spine and spinal cord. Bone marrow signal is stable and within normal limits. Sinuses/Orbits: Negative orbits. Paranasal Visualized paranasal sinuses and mastoids are stable and well pneumatized. Other: Scalp and face soft tissues appear negative. IMPRESSION: 1. Solitary enhancing mass at the right frontal operculum now measures up to 28 mm diameter. As before this is inseparable from the overlying dura which appears mildly thickened, although I favor this is an intra-axial rather than extra-axial metastasis. 2. Mild regression of vasogenic edema and mass effect since last month. No midline shift. 3. No new metastatic disease identified. Electronically Signed   By: Genevie Ann M.D.   On: 08/31/2019 12:50   MR Brain W and Wo Contrast  Result Date: 08/03/2019 CLINICAL DATA:  Abnormal CT, history of lung cancer EXAM: MRI HEAD WITHOUT AND WITH CONTRAST TECHNIQUE: Multiplanar, multiecho pulse sequences of the brain and surrounding structures were obtained without and with intravenous contrast. CONTRAST:  24m GADAVIST GADOBUTROL 1 MMOL/ML IV SOLN COMPARISON:  MRI 2019, CT earlier same day FINDINGS: Brain: Corresponding to abnormality on  CT, there is an enhancing lesion of the peripheral right frontal lobe involving or abutting the lateral precentral gyrus. This measures approximately 2.1 x 2.1 x 2.5 cm. There is significant surrounding edema resulting in regional sulcal effacement, partial effacement of the right lateral ventricle, and mild leftward midline shift measuring 4 mm. No associated reduced diffusion. There is mild susceptibility hypointensity, which may reflect intralesional blood products. There is no additional mass or abnormal enhancement. No hydrocephalus. Additional patchy T2 hyperintensity in the supratentorial white matter is nonspecific but may reflect mild chronic microvascular ischemic changes. No acute infarction. Vascular: Major vessel flow voids at the skull base are preserved. Skull and upper cervical spine: Normal marrow signal is preserved. Sinuses/Orbits: Trace paranasal sinus mucosal thickening. Orbits are unremarkable. Other: Sella is unremarkable. IMPRESSION: Right frontal mass likely reflecting a metastasis. Significant surrounding edema with mild regional mass effect including subcentimeter leftward midline shift. No hydrocephalus. Electronically Signed   By: PMacy MisM.D.   On: 08/03/2019 18:28   CT ABDOMEN PELVIS W CONTRAST  Result Date: 08/09/2019 CLINICAL DATA:  Metastatic lung cancer. EXAM: CT CHEST, ABDOMEN, AND PELVIS WITH CONTRAST TECHNIQUE: Multidetector CT imaging of the chest, abdomen and pelvis was performed following the standard protocol during bolus administration of intravenous contrast. CONTRAST:  1064mOMNIPAQUE IOHEXOL 300 MG/ML  SOLN COMPARISON:  CT chest 07/20/2019. PET-CT 12/29/2018. FINDINGS: CT CHEST FINDINGS Cardiovascular: The heart size is normal. No substantial pericardial effusion. Atherosclerotic calcification is noted in the wall of the thoracic aorta. Right Port-A-Cath tip is positioned in the  upper right atrium. Mediastinum/Nodes: No mediastinal lymphadenopathy. 12 mm short  axis left hilar node visible on 31/2. Probably present on prior study although difficult to assess given lack of intravenous contrast on the earlier exams. No right hilar lymphadenopathy. Tiny hiatal hernia. The esophagus has normal imaging features. There is no axillary lymphadenopathy. Lungs/Pleura: Bandlike interstitial and airspace opacity in the lateral left upper and lower lobes is similar to prior although the collapse/consolidative opacity in the lingula has decreased slightly in the interval. Imaging features likely reflect sequelae of radiation therapy. No suspicious pulmonary nodule or mass. No pleural effusion. Musculoskeletal: No worrisome lytic or sclerotic osseous abnormality. CT ABDOMEN PELVIS FINDINGS Hepatobiliary: No suspicious focal abnormality within the liver parenchyma. There is no evidence for gallstones, gallbladder wall thickening, or pericholecystic fluid. No intrahepatic or extrahepatic biliary dilation. Pancreas: No focal mass lesion. No dilatation of the main duct. No intraparenchymal cyst. No peripancreatic edema. Spleen: No splenomegaly. No focal mass lesion. Adrenals/Urinary Tract: No adrenal nodule or mass. Kidneys unremarkable. No evidence for hydroureter. The urinary bladder appears normal for the degree of distention. Stomach/Bowel: Tiny hiatal hernia. Stomach is unremarkable. No gastric wall thickening. No evidence of outlet obstruction. Duodenum is normally positioned as is the ligament of Treitz. Duodenal diverticulum evident. No small bowel wall thickening. No small bowel dilatation. The terminal ileum is normal. The appendix is normal. No gross colonic mass. No colonic wall thickening. Diverticular changes are noted in the left colon without evidence of diverticulitis. Vascular/Lymphatic: There is abdominal aortic atherosclerosis without aneurysm. There is no gastrohepatic or hepatoduodenal ligament lymphadenopathy. No retroperitoneal or mesenteric lymphadenopathy. No pelvic  sidewall lymphadenopathy. Reproductive: The uterus is unremarkable. 2.5 cm cystic lesion left ovary is stable since PET-CT 12/29/2018. Other: No intraperitoneal free fluid. Musculoskeletal: No worrisome lytic or sclerotic osseous abnormality. IMPRESSION: 1. Chest CT stable since 07/20/2019 without new or progressive findings. 2. 12 mm short axis left hilar lymph node is probably stable since prior study although difficult to assess given lack of intravenous contrast on the earlier exams. Close attention on follow-up recommended. 3. No findings to suggest metastatic disease in the abdomen/pelvis. 4. Left colonic diverticulosis without diverticulitis. 5. Stable 2.5 cm cystic lesion left ovary, not hypermetabolic on previous PET imaging and likely benign. 6. Aortic Atherosclerosis (ICD10-I70.0). Electronically Signed   By: Misty Stanley M.D.   On: 08/09/2019 16:54   Assessment and plan- Patient is a 64 y.o. female who presents to symptom management for concerns of bilateral lower extremity swelling and mouth pain.  Mouth pain likely due to oral chemotherapy Tagrisso.  No visible sores on exam but is bothersome to patient and restricting diet. We will start Magic mouthwash with lidocaine- sent to pharmacy.  According to up-to-date, 32% of patients develop stomatitis and less than 2% develop grade 3-4.    Lower extremity swelling likely multifactorial. Patient states she noticed swelling with initiation of dexamethasone.  Blood pressure currently not controlled for the past 2 weeks.  Initiation of dexamethasone for patients with underlying hypertension can be associated with fluid retention, electrolyte disturbances and hypertension.  Tagrisso can also, although rare cause cardiomyopathy, decreased left ventricular ejection fraction and prolonged QT interval.  Mrs. Cutillo does not have underlying cardiac issues except for hypertension; typically well controlled. Will start by adjusting BP medications and if  symptoms do not improve can refer to cardiology.  Will touch base with Dr. Ronnald Ramp to see if we can optimize her lisinopril/HCTZ for fluid retention and hypertension.   Plan:  Rx Magic mouthwash sent to pharmacy. Rx viscous lidocaine 2% sent to pharmacy. Adjust Lisinipril/HCTZ from 10/12.5 daily to 20/25 mg daily.  Spoke with Dr. Ronnald Ramp who will adjust prescription and call patient with new dosing.  Disposition: RTC on 09/05/2019 for simulation prior to stereotactic brain radiation. RTC on 09/09/2019 for follow-up with Dr. Mike Gip after initiating Tagrisso. RTC on 09/12/2019 for stereotactic brain radiation.   Visit Diagnosis 1. Metastatic adenocarcinoma to lung with unknown primary site, unspecified laterality ([place])   2. Localized swelling of left lower extremity   3. Acute pain of mouth     Patient expressed understanding and was in agreement with this plan. She also understands that She can call clinic at any time with any questions, concerns, or complaints.   Greater than 50% was spent in counseling and coordination of care with this patient including but not limited to discussion of the relevant topics above (See A&P) including, but not limited to diagnosis and management of acute and chronic medical conditions.   Thank you for allowing me to participate in the care of this very pleasant patient.    Jacquelin Hawking, NP [facility] Cell - [phone] Pager- [phone] 09/02/2019 10:10 AM

## 2019-09-01 NOTE — Telephone Encounter (Signed)
Spoke to patient via telephone. Patient accepted appointment in the symptom management clinic today with Rulon Abide, NP at 3:00 pm.

## 2019-09-01 NOTE — Telephone Encounter (Signed)
Patient called and left message on my voice mail that she needs a mouthwash for thrush and that she also needs a diuretic for swelling in her legs and feet as an over the counter medicine is not helping with the swelling. She reports that she saw Dr Baruch Gouty yesterday and he told her to contact Dr Mike Gip for prescription. Please advise

## 2019-09-01 NOTE — Telephone Encounter (Signed)
I will call patient and offer her an appointment in the symptom management clinic.

## 2019-09-01 NOTE — Telephone Encounter (Signed)
  Kristina Wright is likely due to steroids.  Unclear why she would have lower extremity edema.  She should be seen to ensure she doesn't need a duplex to r/o DVT.  M

## 2019-09-02 ENCOUNTER — Telehealth: Payer: Self-pay | Admitting: *Deleted

## 2019-09-02 MED ORDER — LIDOCAINE VISCOUS HCL 2 % MT SOLN: 15.0000 mL | OROMUCOSAL | 0 refills | Status: DC | PRN

## 2019-09-02 NOTE — Telephone Encounter (Signed)
No problem. I will send now.   [name], NP 09/02/2019 9:22 AM

## 2019-09-02 NOTE — Telephone Encounter (Signed)
Patient was expecting a prescription for lidocaine to be sent and it is not at pharmacy.  Please advise

## 2019-09-05 ENCOUNTER — Ambulatory Visit
Admission: RE | Admit: 2019-09-05 | Discharge: 2019-09-05 | Disposition: A | Discharge status: Home / Self Care | Payer: 59 | Source: Ambulatory Visit | Attending: Radiation Oncology | Admitting: Radiation Oncology

## 2019-09-05 ENCOUNTER — Other Ambulatory Visit: Payer: Self-pay | Admitting: Hematology and Oncology

## 2019-09-05 DIAGNOSIS — C801 Malignant (primary) neoplasm, unspecified: Secondary | ICD-10-CM

## 2019-09-05 DIAGNOSIS — C7800 Secondary malignant neoplasm of unspecified lung: Secondary | ICD-10-CM

## 2019-09-05 DIAGNOSIS — C7931 Secondary malignant neoplasm of brain: Secondary | ICD-10-CM | POA: Insufficient documentation

## 2019-09-05 DIAGNOSIS — Z510 Encounter for antineoplastic radiation therapy: Secondary | ICD-10-CM | POA: Insufficient documentation

## 2019-09-05 DIAGNOSIS — C3432 Malignant neoplasm of lower lobe, left bronchus or lung: Secondary | ICD-10-CM | POA: Insufficient documentation

## 2019-09-06 DIAGNOSIS — Z510 Encounter for antineoplastic radiation therapy: Secondary | ICD-10-CM | POA: Diagnosis not present

## 2019-09-06 NOTE — Progress Notes (Signed)
[facility]  [address], [address] [place], [place] [address] Phone: [phone]  Fax: [phone]   Clinic Day:  09/08/2019  Referring physician: Juline Patch, MD  Chief Complaint: Kristina Wright is a 64 y.o. female with stage IV adenocarcinoma of the lung on osimertiniband isolated CNS metastasis who is seen for a 1 month assessment.  HPI: The patient was last seen in the medical oncology clinic on 08/11/2019.  At that time, she was fatigued. She noted "a little headache", but denied any focal numbness or weakness. Hematocrit was 41.6, hemoglobin 13.8, platelets 321,000, WBC 15,200 (ANC 11,000). Potassium 3.4. Creatinine was 0.94.  We discussed plans for [facility] for the right frontal mass.  She continued Decadron 4 mg BID.  She continued osimertinib 80 mg a day.  Head MRI on 08/31/2019 revealed a 2.8 solitary enhancing mass at the right frontal operculum.  This was inseparable from the overlying dura which appeared mildly thickened, although I favor this is an intra-axial rather than extra-axial metastasis. There was mild regression of vasogenic edema and mass effect since last month. There was no midline shift and no new metastatic disease.  She saw [name], NP on 09/01/2019 for a sore mouth and throat along with swelling in lower extremities x 1-2 weeks. She had an ongoing headache since the COVID-19 vaccine. She had increased mouth sensitivity. She had no visible mouth sores.  Lower extremity edema began after initiation of Decadron. Patient was prescribed magic mouthwash. Lisinopril/HCTZ was adjusted from 10/12.5 mg daily to 20/25 mg daily.    Stereotactic brain radiation is scheduled for 09/12/2019.  During the interim, she has felt "tired and miserable". She is experiencing headaches. Patient has diarrhea and nausea; she thinks she had food poisoning from [place] she ate last night. She dropped a dust buster on her right foot and now it is sore and  bruised. She has bilateral lower extremity edema. She has sores on the right buttocks that looks like [place] bites x 2 weeks.  Lesions were vesicular in nature and are on one side.  She has been using cortisol cream for the burning and itching. She denies having sores in any other place on her body. Patient would like to know if she has shingles or should get the shingles vaccine. She had her last COVID-19 vaccine 1 month ago.   She has trouble swallowing secondary to thrush. She has throat pain which prevents her from eating. She has neck swelling. She notes mild improvement in her oral symptoms with Magic mouthwash. She is worried she may have mouth cancer. She is drooling more often secondary to thrush. The first digit on her right hand in swollen.   Patient would like to be set up for another colonoscopy.   The patient was very upset and trying to stay strong but she feels miserable. She continues to work.  She is worried she will get fired and have to sell her house. She feels like she has no one to help. She has a sister who takes her to church every Sunday.  Her other sister lives next door.  She feels like she is the next sister to die out of 7 sisters; there are 3 sisters left. She feels like her kids are too busy to help her. She reports reading the Bible and trying to stay positive. She is overwhelmed.    Past Medical History:  Diagnosis Date  . Cancer ([place]) 1989   cervical  .  Depression   . Family history of breast cancer   . Family history of colon cancer   . Family history of ovarian cancer   . Family history of stomach cancer   . Family history of throat cancer   . Hyperlipidemia   . Hypertension   . Malignant neoplasm of lung ([place]) 03/01/2018  . Thyroid disease     Past Surgical History:  Procedure Laterality Date  . BILATERAL CARPAL TUNNEL RELEASE    . BREAST CYST ASPIRATION Left   . LUNG BIOPSY    . PARTIAL HYSTERECTOMY    . PORTA CATH INSERTION N/A 03/03/2018    Procedure: PORTA CATH INSERTION;  Surgeon: Katha Cabal, MD;  Location: [facility];  Service: Cardiovascular;  Laterality: N/A;  . TUBAL LIGATION      Family History  Problem Relation Age of Onset  . Colon cancer Mother 62  . Colon cancer Maternal Grandmother 78  . Hypertension Father   . Breast cancer Sister 9  . Throat cancer Brother   . Heart attack Maternal Aunt   . Ovarian cancer Maternal Aunt 70  . Stomach cancer Paternal Grandmother        dx >50  . Throat cancer Brother     Social History:  reports that she quit smoking about 21 months ago. Her smoking use included cigarettes. She has a 25.00 pack-year smoking history. She has never used smokeless tobacco. She reports current drug use. Drug: Marijuana. She reports that she does not drink alcohol. 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 The patient is alone today.  Allergies: No Known Allergies  Current Medications: Current Outpatient Medications  Medication Sig Dispense Refill  . ALPRAZolam (XANAX) 0.25 MG tablet Take 1 tablet (0.25 mg total) by mouth at bedtime as needed. for anxiety 30 tablet 5  . aspirin EC 81 MG tablet Take 81 mg by mouth daily.    Marland Kitchen atorvastatin (LIPITOR) 10 MG tablet Take 1 tablet (10 mg total) by mouth daily. 90 tablet 1  . busPIRone (BUSPAR) 5 MG tablet Take 1 tablet (5 mg total) by mouth daily. 90 tablet 1  . clopidogrel (PLAVIX) 75 MG tablet Take 1 tablet (75 mg total) by mouth daily. 90 tablet 1  . dexamethasone (DECADRON) 4 MG tablet Take 1 tablet (4 mg total) by mouth 2 (two) times daily. 28 tablet 1  . FLUoxetine (PROZAC) 10 MG capsule Take 1 capsule (10 mg total) by mouth daily. 90 capsule 1  . hydrOXYzine (ATARAX/VISTARIL) 10 MG tablet Take 1  tablet (10 mg total) by mouth daily. 30 tablet 5  . levETIRAcetam (KEPPRA) 500 MG tablet Take 1 tablet (500 mg total) by mouth 2 (two) times daily. 60 tablet 2  . levothyroxine (SYNTHROID) 112 MCG tablet Take 1 tablet (112 mcg total) by mouth daily. 90 tablet 1  . lidocaine (XYLOCAINE) 2 % solution Use as directed 15 mLs in the mouth or throat as needed for mouth pain. 100 mL 0  . lisinopril-hydrochlorothiazide (ZESTORETIC) 20-25 MG tablet Take 1 tablet by mouth daily. 90 tablet 0  . meclizine (ANTIVERT) 25 MG tablet Take 1 tablet (25 mg total) by mouth 3 (three) times daily as needed for dizziness. 30 tablet 0  . Multiple Vitamins-Minerals (MULTIVITAMIN ADULT PO) Take 1 tablet by mouth daily.     Marland Kitchen nystatin  cream (MYCOSTATIN) Apply 1 application topically 2 (two) times daily. 30 g 0  . olopatadine (PATANOL) 0.1 % ophthalmic solution Place 1 drop into both eyes 2 (two) times daily. 5 mL 12  . pantoprazole (PROTONIX) 20 MG tablet Take 1 tablet (20 mg total) by mouth daily. 30 tablet 1  . sucralfate (CARAFATE) 1 g tablet Take 1 tablet (1 g total) by mouth 4 (four) times daily -  with meals and at bedtime. 30 tablet 1  . TAGRISSO 80 MG tablet TAKE 1 TABLET (80 MG TOTAL) BY MOUTH DAILY. 30 tablet 2  . triamcinolone cream (KENALOG) 0.1 % Apply 1 application topically 2 (two) times daily. 30 g 1  . umeclidinium-vilanterol (ANORO ELLIPTA) 62.5-25 MCG/INH AEPB Inhale 1 puff into the lungs daily. 60 each 5  . VENTOLIN HFA 108 (90 Base) MCG/ACT inhaler Inhale 1-2 puffs into the lungs every 6 (six) hours as needed for wheezing or shortness of breath. 18 g 1   No current facility-administered medications for this visit.    Review of Systems  Constitutional: Positive for malaise/fatigue. Negative for chills, diaphoresis, fever and weight loss (up 5 lbs).       Feels "tired and miserable".  HENT: Positive for sore throat (drooling; thrush, improved with Magic mouthwash). Negative for congestion, ear  discharge, ear pain, hearing loss, nosebleeds, sinus pain and tinnitus.   Eyes: Negative.  Negative for blurred vision, double vision and photophobia.  Respiratory: Negative for cough, hemoptysis, sputum production and shortness of breath (uses inhaler).   Cardiovascular: Negative.  Negative for chest pain, palpitations, orthopnea and leg swelling.  Gastrointestinal: Positive for diarrhea (after [place] food) and nausea (at [place]). Negative for abdominal pain, blood in stool, constipation, heartburn, melena and vomiting.  Genitourinary: Negative for dysuria, frequency, hematuria and urgency.  Musculoskeletal: Positive for myalgias (right foot) and neck pain (edema). Negative for back pain and joint pain.       First right digit swollen.  Skin: Positive for itching (associated burning with sores on buttocks). Negative for rash.       Sores on right buttock x 2 weeks.  Neurological: Positive for headaches. Negative for dizziness, tingling, sensory change and weakness.  Endo/Heme/Allergies: Bruises/bleeds easily (right foot).       Thyroid disease on Synthroid.  Psychiatric/Behavioral: Negative for depression and memory loss. The patient is nervous/anxious (takes Xanax occasionally). The patient does not have insomnia.        Stress related to work and health.  All other systems reviewed and are negative.   Performance status (ECOG): 1  Vitals Blood pressure 116/67, pulse 100, temperature 98.7 F (37.1 C), resp. rate 18, weight 223 lb 7 oz (101.4 kg).   Physical Exam  Constitutional: She is oriented to person, place, and time. She appears well-developed and well-nourished. No distress.  HENT:  Head: Normocephalic and atraumatic.  Mouth/Throat: Oropharynx is clear and moist. No oropharyngeal exudate (no thrush or oral lesions).  Shoulder length brown hair.  Mask.  Eyes: Pupils are equal, round, and reactive to light. Conjunctivae and EOM are normal. No scleral icterus.  Glasses.  Brown  hair.  Neck: No JVD present.  Cardiovascular: Normal rate, regular rhythm and normal heart sounds. Exam reveals no gallop.  No murmur heard. Pulmonary/Chest: Effort normal and breath sounds normal. No respiratory distress. She has no wheezes. She has no rales. She exhibits no tenderness.  Abdominal: Soft. Bowel sounds are normal. She exhibits no distension and no mass. There is no abdominal tenderness.  There is no rebound and no guarding.  Musculoskeletal:        General: Edema present. No tenderness. Normal range of motion.     Cervical back: Normal range of motion and neck supple.     Right ankle: Swelling and ecchymosis present.     Comments: Right foot mildly edematous and bruised, tender to palpation.  Lymphadenopathy:       Head (right side): No preauricular, no posterior auricular and no occipital adenopathy present.       Head (left side): No preauricular, no posterior auricular and no occipital adenopathy present.    She has no cervical adenopathy.    She has no axillary adenopathy.       Right: No inguinal and no supraclavicular adenopathy present.       Left: No inguinal and no supraclavicular adenopathy present.  Neurological: She is alert and oriented to person, place, and time. No cranial nerve deficit. Coordination normal.  Skin: Skin is warm and dry. Rash noted. She is not diaphoretic. No erythema. No pallor.  Sores on right buttock in S1/S2 dermatomal distribution. Bilateral arm ecchymosis.  Psychiatric: She has a normal mood and affect. Her behavior is normal. Judgment and thought content normal.  Tearful.  Nursing note and vitals reviewed.                  Right Buttocks   Imaging studies: 02/09/2018:  PET scanrevealed an unusual pattern of nodal metastasis with intensely hypermetabolic enlarged precarinal lymph node, RIGHT hilar lymph node and RIGHT axillary lymph node. Recommend biopsy of the RIGHT axillarylymph node. There was no metabolic activity  associated with the RIGHT lobe pulmonary nodule. There was no additional evidence primary or metastatic carcinoma. Clinical stageT1N3M1. 03/10/2018:  Head MRIrevealed no evidence of metastatic disease. 11/24/2018:  Chest CTrevealed an enlarging left lower lobe pulmonary nodule measuring1.5 x 1.4 cm, concerning for progressive malignancy. There was mild diffuse bronchial wall thickening with mild centrilobular and paraseptal emphysema; imaging findings suggestive of underlying COPD. 12/29/2018:  PETscanrevealedan isolated 1.2 cm hypermetabolic LLL pulmonary nodule (SUV 8.0). There was no evidence of metastasis. Sherefused a biopsy.  04/20/2019:  ChestCTrevealed an interval reduction in volume of theleftlower lobe nodule following stereotactic body radiation treatment. There was no new or progressive malignancy.   07/20/2018:  Chest CT revealed continued evolutionary changes secondary to external beam radiation involving the lingula and superior segment of left lower lobe. There was no specific findings to suggest residual or recurrence of tumor. 08/03/2019:  Head MRI revealed a 2.1 x 2.1 x 2.5 cm right frontal mass involving/abutting the lateral precentral gyrus likely reflecting a metastasis. There was significant surrounding edema with mild regional mass effect including subcentimeter leftward midline shift. There was no hydrocephalus. 08/09/2019:  Chest, abdomen and pelvis CT revealed stable chest CT findings.  There was a 12 mm short axis left hilar lymph node was probably stable since prior study although difficult to assess given lack of intravenous contrast on the earlier exams. Close attention on follow-up was recommended. There was no findings to suggest metastatic disease in the abdomen/pelvis. There was left colonic diverticulosis without diverticulitis. There was stable 2.5 cm cystic lesion left ovary, not hypermetabolic on previous PET imaging and likely benign.   No visits with  results within 3 Day(s) from this visit.  Latest known visit with results is:  Hospital Outpatient Visit on 08/09/2019  Component Date Value Ref Range Status  . Creatinine, Ser 08/09/2019 0.94  0.44 - 1.00  mg/dL Final  . GFR calc non Af Amer 08/09/2019 >60  >60 mL/min Final  . GFR calc Af Amer 08/09/2019 >60  >60 mL/min Final   Performed at [facility], [address] [place], [place] [address]    Assessment:  Kristina Wright is a 64 y.o. female with stage IV adenocarcinoma of the lungs/p right axillary biopsy on 02/24/2018. Pathologyrevealed metastatic adenocarcinoma c/w lung primary. Tumor cells were positive for CK7 and TTF-1 with minimal blush reactivity for CK20. Tumor cells were negative for GATA3, ER, and PR. Omniseq testingrevealed EGFR c.2582>A (L861Q) mutation and PDL-1 60% TPS. CEAwas 2.9 on 05/17/2018.  PET scanon 02/09/2018 revealed an unusual pattern of nodal metastasis with intensely hypermetabolic enlarged precarinal lymph node, RIGHT hilar lymph node and RIGHT axillary lymph node. Recommend biopsy of the RIGHT axillarylymph node. There was no metabolic activity associated with the RIGHT lobe pulmonary nodule. There was no additional evidence primary or metastatic carcinoma. Clinical stageT1N3M1.  Head MRIon 03/10/2018 revealed no evidence of metastatic disease.  PETscanon 12/29/2018 revealedan isolated 1.2 cm hypermetabolic LLL pulmonary nodule (SUV 8.0). There was no evidence of metastasis. Sherefused a biopsy.   Shereceived SBRT(6,000 cGy in 5 fractions) to the left lower lobe nodulefrom 02/03/2019 - 02/28/2019.  Head MRI on 08/03/2019 revealed a 2.1 x 2.1 x 2.5 cm right frontal mass involving/abutting the lateral precentral gyrus likely reflecting a metastasis. There was significant surrounding edema with mild regional mass effect including subcentimeter leftward midline shift. There was no hydrocephalus.  Head MRI on 08/31/2019  revealed a 2.8 solitary enhancing mass at the right frontal operculum.  This was inseparable from the overlying dura which appeared mildly thickened, although I favor this is an intra-axial rather than extra-axial metastasis. There was mild regression of vasogenic edema and mass effect since last month. There was no midline shift and no new metastatic disease.  Chest, abdomen and pelvis CT on 08/09/2019 revealed stable chest CT findings.  There was a 12 mm short axis left hilar lymph node was probably stable since prior study although difficult to assess given lack of intravenous contrast on the earlier exams. Close attention on follow-up was recommended. There was no findings to suggest metastatic disease in the abdomen/pelvis. There was left colonic diverticulosis without diverticulitis. There was stable 2.5 cm cystic lesion left ovary, not hypermetabolic on previous PET imaging and likely benign.  She began osimertinibon 03/25/2018.[place]oon 07/27/2018 showed an ejection fraction in the range of 60-65%. She is tolerating treatment well.  Diagnostic left mammogramon 11/01/2018 revealed a benign left breast mass unchanged mammographically since at least 2016. There were no suspicious findings in the left breast.  She has a history of cervical cancerin 1989. She underwent cryotherapy.  She has an extensive family history of malignancy.  [facility] genetic testing on 05/31/2018 revealed no pathogenic mutations.  Shereceivedher second dose of theCOVID-19vaccineon 07/13/2019.   Symptomatically, she has had headaches associated with CNS metastasis.  She denied any numbness, weakness, balance or coordination issues.  Thrush appears to have resolved with Magic mouthwash.  She has a rash in a S1/S2 right dermatomal distribution worrisome for shingles.  She has trauma to her right foot.  Plan: 1.   Labs today: CBC with diff, CMP. 2.   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 PET scan on 12/29/2018 revealed an isolated 1.2 cm LLL pulmonary nodule. She received SBRT (completed 02/28/2019).  Chest, abdomen, and pelvis CT on 08/09/2019 revealed no evidence of metastatic disease. Head MRI confirmed an isolated CNS metastasis on 08/03/2019.  She is on osimertinib 80 mg a day. 3.Right frontal CNS metastasis Head MRI on 08/03/2019 revealed a 2.5 cm isolated right frontal mass with associated edema.  She remains on Decadron 4 mg BID to decrease edema.   Refill Decadron.  Taper schedule per radiation oncology.  Stereotactic radiation scheduled for 09/12/2019.  Continue Keppra. 4.   Thrush  No visible thrush on Magic mouthwash.  Etiology secondary to Decadron.  Consider fluconazole if thrush returns. 5.   Shingles  Clinically, patient has had a vesicular rash on her right buttock x 2 weeks.  Lesions appear to be in an S1/S2 dermatomal distribution.  She has been on Decadron.  Rx:  valacyclovir 1000 mg po TID.  Encourage patient to drink plenty of fluids. 6.   Foot trauma  Patient notes trauma to right foot after a dustbuster fell on it.  Exam reveals bruising, some edema and tenderness.  Plain films of the foot to r/o fracture.    Patient hesistant to pursue imaging. 7.   RTC in 1 week for MD assessment and labs (BMP).  I discussed the assessment and treatment plan with the patient.  The patient was provided an opportunity to ask questions and all were answered.  The patient agreed with the plan and demonstrated an understanding of the instructions.  The patient was advised to call back if the symptoms worsen or if the condition fails to improve as anticipated.  I provided 25 minutes of face-to-face time during this this encounter and > 50% was spent counseling as documented under my assessment and plan.  An additional 5-8  minutes were spent reviewing herchart (Epic and Care Everywhere) including notes, labs, and imaging studies.    Lequita Asal, MD, PhD    09/08/2019, 4:14 PM  I, Selena Batten, am acting as scribe for [employer]. Mike Gip, MD, PhD.  I, Hubert Derstine C. Mike Gip, MD, have reviewed the above documentation for accuracy and completeness, and I agree with the above.

## 2019-09-07 ENCOUNTER — Ambulatory Visit: Payer: 59

## 2019-09-08 ENCOUNTER — Inpatient Hospital Stay: Payer: 59

## 2019-09-08 ENCOUNTER — Other Ambulatory Visit: Payer: Self-pay

## 2019-09-08 ENCOUNTER — Inpatient Hospital Stay (HOSPITAL_BASED_OUTPATIENT_CLINIC_OR_DEPARTMENT_OTHER): Payer: 59 | Admitting: Hematology and Oncology

## 2019-09-08 VITALS — BP 116/67 | HR 100 | Temp 98.7°F | Resp 18 | Wt 223.4 lb

## 2019-09-08 DIAGNOSIS — C7800 Secondary malignant neoplasm of unspecified lung: Secondary | ICD-10-CM

## 2019-09-08 DIAGNOSIS — C7931 Secondary malignant neoplasm of brain: Secondary | ICD-10-CM | POA: Diagnosis not present

## 2019-09-08 DIAGNOSIS — B029 Zoster without complications: Secondary | ICD-10-CM

## 2019-09-08 DIAGNOSIS — C3432 Malignant neoplasm of lower lobe, left bronchus or lung: Secondary | ICD-10-CM | POA: Diagnosis not present

## 2019-09-08 DIAGNOSIS — Z7189 Other specified counseling: Secondary | ICD-10-CM

## 2019-09-08 DIAGNOSIS — S99921A Unspecified injury of right foot, initial encounter: Secondary | ICD-10-CM

## 2019-09-08 DIAGNOSIS — C3490 Malignant neoplasm of unspecified part of unspecified bronchus or lung: Secondary | ICD-10-CM

## 2019-09-08 DIAGNOSIS — C801 Malignant (primary) neoplasm, unspecified: Secondary | ICD-10-CM

## 2019-09-08 LAB — COMPREHENSIVE METABOLIC PANEL
ALT: 51 U/L — ABNORMAL HIGH (ref 0–44)
AST: 35 U/L (ref 15–41)
Albumin: 3.8 g/dL (ref 3.5–5.0)
Alkaline Phosphatase: 61 U/L (ref 38–126)
Anion gap: 10 (ref 5–15)
BUN: 28 mg/dL — ABNORMAL HIGH (ref 8–23)
CO2: 25 mmol/L (ref 22–32)
Calcium: 8.9 mg/dL (ref 8.9–10.3)
Chloride: 97 mmol/L — ABNORMAL LOW (ref 98–111)
Creatinine, Ser: 0.93 mg/dL (ref 0.44–1.00)
GFR calc Af Amer: >60 mL/min (ref >60)
GFR calc non Af Amer: >60 mL/min (ref >60)
Glucose, Bld: 163 mg/dL — ABNORMAL HIGH (ref 70–99)
Potassium: 4.3 mmol/L (ref 3.5–5.1)
Sodium: 132 mmol/L — ABNORMAL LOW (ref 135–145)
Total Bilirubin: 0.7 mg/dL (ref 0.3–1.2)
Total Protein: 6.7 g/dL (ref 6.5–8.1)

## 2019-09-08 LAB — CBC WITH DIFFERENTIAL/PLATELET
Abs Immature Granulocytes: 0.08 10*3/uL — ABNORMAL HIGH (ref 0.00–0.07)
Basophils Absolute: 0 10*3/uL (ref 0.0–0.1)
Basophils Relative: 0 %
Eosinophils Absolute: 0 10*3/uL (ref 0.0–0.5)
Eosinophils Relative: 0 %
HCT: 44 % (ref 36.0–46.0)
Hemoglobin: 14.5 g/dL (ref 12.0–15.0)
Immature Granulocytes: 1 %
Lymphocytes Relative: 8 %
Lymphs Abs: 1.1 10*3/uL (ref 0.7–4.0)
MCH: 30.1 pg (ref 26.0–34.0)
MCHC: 33 g/dL (ref 30.0–36.0)
MCV: 91.5 fL (ref 80.0–100.0)
Monocytes Absolute: 0.5 10*3/uL (ref 0.1–1.0)
Monocytes Relative: 4 %
Neutro Abs: 12.3 10*3/uL — ABNORMAL HIGH (ref 1.7–7.7)
Neutrophils Relative %: 87 %
Platelets: 234 10*3/uL (ref 150–400)
RBC: 4.81 MIL/uL (ref 3.87–5.11)
RDW: 14.6 % (ref 11.5–15.5)
WBC: 14 10*3/uL — ABNORMAL HIGH (ref 4.0–10.5)
nRBC: 0 % (ref 0.0–0.2)

## 2019-09-08 MED ORDER — VALACYCLOVIR HCL 1 G PO TABS: 1000.0000 mg | ORAL_TABLET | Freq: Three times a day (TID) | ORAL | 1 refills | Status: DC

## 2019-09-08 MED ORDER — DEXAMETHASONE 4 MG PO TABS: 4.0000 mg | ORAL_TABLET | Freq: Two times a day (BID) | ORAL | 1 refills | Status: DC

## 2019-09-08 NOTE — Progress Notes (Signed)
Patient reports she has a list of concerns:  Can she get Shingles vaccine  She has sores on the right buttocks  Trouble swollowing  Refill dexamethasone  Check if neck swelling  Patient has diarrhea and nausea, she thinks she had food poisoning.

## 2019-09-12 ENCOUNTER — Ambulatory Visit
Admission: RE | Admit: 2019-09-12 | Discharge: 2019-09-12 | Disposition: A | Discharge status: Home / Self Care | Payer: 59 | Source: Ambulatory Visit | Attending: Radiation Oncology | Admitting: Radiation Oncology

## 2019-09-12 ENCOUNTER — Telehealth: Payer: Self-pay | Admitting: Pharmacy Technician

## 2019-09-12 ENCOUNTER — Other Ambulatory Visit: Payer: Self-pay | Admitting: Pharmacist

## 2019-09-12 DIAGNOSIS — Z510 Encounter for antineoplastic radiation therapy: Secondary | ICD-10-CM | POA: Diagnosis not present

## 2019-09-12 DIAGNOSIS — C7800 Secondary malignant neoplasm of unspecified lung: Secondary | ICD-10-CM

## 2019-09-12 MED ORDER — OSIMERTINIB MESYLATE 80 MG PO TABS: 80.0000 mg | ORAL_TABLET | Freq: Every day | ORAL | 2 refills | Status: DC

## 2019-09-12 NOTE — Telephone Encounter (Signed)
Oral Oncology Patient Advocate Encounter  Prior Authorization for Newman Nip has been approved.    PA# [id number] Effective dates: 09/12/19 through 09/11/20  Patient must use [facility].  Oral Oncology Clinic will continue to follow.   [place] [facility] [place] Phone [phone] Fax [phone] 09/12/2019 11:26 AM

## 2019-09-12 NOTE — Progress Notes (Signed)
Oral Chemotherapy Pharmacist Encounter  Due to insurance restriction the Tagrisso can np longer be filled at [facility]. Prescription has been redirected to [facility].  Supportive information was faxed to [facility]. We will continue to follow medication access.   Called and notified patient, provided with new pharmacy number.  Darl Pikes, PharmD, BCPS, [facility] Hematology/Oncology Clinical Pharmacist [facility] [phone]  09/12/2019 4:54 PM

## 2019-09-12 NOTE — Telephone Encounter (Signed)
Oral Oncology Patient Advocate Encounter  Butch Penny from [place] notified me that patient had new insurance and a prior authorization is required.  Received notification from [place] that prior authorization for Newman Nip is required.  PA submitted on CoverMyMeds Key [id number] Status is pending  Oral Oncology Clinic will continue to follow.  [facility] [place] Phone [phone] Fax [phone] 09/12/2019 9:14 AM

## 2019-09-13 ENCOUNTER — Inpatient Hospital Stay (HOSPITAL_BASED_OUTPATIENT_CLINIC_OR_DEPARTMENT_OTHER): Payer: 59 | Admitting: Oncology

## 2019-09-13 ENCOUNTER — Other Ambulatory Visit: Payer: Self-pay

## 2019-09-13 ENCOUNTER — Telehealth: Payer: Self-pay | Admitting: Family Medicine

## 2019-09-13 ENCOUNTER — Telehealth: Payer: Self-pay

## 2019-09-13 VITALS — BP 130/69 | HR 110 | Temp 97.3°F | Wt 222.7 lb

## 2019-09-13 DIAGNOSIS — L03019 Cellulitis of unspecified finger: Secondary | ICD-10-CM

## 2019-09-13 DIAGNOSIS — C3432 Malignant neoplasm of lower lobe, left bronchus or lung: Secondary | ICD-10-CM | POA: Diagnosis not present

## 2019-09-13 MED ORDER — CHLORHEXIDINE GLUCONATE 0.12 % MT SOLN: OROMUCOSAL | 0 refills | Status: DC

## 2019-09-13 MED ORDER — MUPIROCIN 2 % EX OINT: 1.0000 "application " | TOPICAL_OINTMENT | Freq: Two times a day (BID) | CUTANEOUS | 0 refills | Status: DC

## 2019-09-13 NOTE — Progress Notes (Signed)
Patient states that she think that she has paronychia. Her fingers have broken skin, blue tinged, tender, numb, and swollen. She thinks it has to do with a manicure and her medication. He toes are starting to get sore too. Her right foot is severely bruised from dropping a vacuum cleaner in it. She also has peripheral artery disease.

## 2019-09-13 NOTE — Telephone Encounter (Signed)
I don't see where Kendrick Fries, sister, is on Hanaa's info to speak to. Is she? At any rate, we are talking to her cancer doctors and will find out what to do.

## 2019-09-13 NOTE — Telephone Encounter (Signed)
Copied from [place] [phone]. Topic: Quick Communication - Rx Refill/Question >> Sep 13, 2019 12:33 PM Kristina Wright wrote: Pt's sister called to report that the pt just had brain surgery for cancer with radiation. "Her oncologist is requesting that we double the dosage for her Blood pressure." Please advise  Best contact: [phone]

## 2019-09-13 NOTE — Telephone Encounter (Signed)
No I do not.

## 2019-09-13 NOTE — Progress Notes (Signed)
Symptom Management Consult note [facility]  [phone] [phone]  Patient Care Team: Juline Patch, MD as PCP - General (Family Medicine) Lequita Asal, MD as Referring Physician (Hematology and Oncology) Delana Meyer, Dolores Lory, MD (Vascular Surgery) Noreene Filbert, MD as Radiation Oncologist (Radiation Oncology)   Name of the patient: Kristina Wright  [id number]  07-22-1955   Date of visit: 09/13/2019   Diagnosis-lung cancer with mets to the brain  Chief complaint/ Reason for visit-skin concerns  Heme/Onc history:  Oncology History  Malignant neoplasm of lung ([place])  03/01/2018 Initial Diagnosis   Malignant neoplasm of lung ([place])   03/12/2018 Cancer Staging   Staging form: Lung, AJCC 8th Edition - Clinical stage from 03/12/2018: Stage IV (cT1, cN3, pM1) - Signed by Earlie Server, MD on 03/12/2018   03/15/2018 - 03/15/2018 Chemotherapy   The patient had palonosetron (ALOXI) injection 0.25 mg, 0.25 mg, Intravenous,  Once, 0 of 4 cycles PEMEtrexed (ALIMTA) 1,000 mg in sodium chloride 0.9 % 100 mL chemo infusion, 500 mg/m2, Intravenous,  Once, 0 of 6 cycles CARBOplatin (PARAPLATIN) in sodium chloride 0.9 % 100 mL chemo infusion, , Intravenous,  Once, 0 of 4 cycles pembrolizumab (KEYTRUDA) 200 mg in sodium chloride 0.9 % 50 mL chemo infusion, 200 mg, Intravenous, Once, 0 of 6 cycles fosaprepitant (EMEND) 150 mg, dexamethasone (DECADRON) 12 mg in sodium chloride 0.9 % 145 mL IVPB, , Intravenous,  Once, 0 of 4 cycles  for chemotherapy treatment.      Interval history-Mrs. Dornbush is a 64 year old female with past medical history significant for stage IV lung cancer with metastatic disease to the brain status post radiation therapy and currently on Tagrisso who presents today for concerns of sore fingers and cuticle pain.  Symptoms started approximately 1 week ago and have progressively become worse.  She also complains of right foot pain  after she dropped a vacuum cleaner on her foot.  She was evaluated by Dr. Mike Gip and instructed to get plain films of both feet for bilateral lower extremity swelling but patient has not had them done.  She states she has googled side effects from [place] and is worried she has paronychia.  She denies any recent fevers or illness, easy bleeding or bruising reports a good appetite and denies any intentional weight loss.  She denies chest pain, nausea, vomiting, constipation or diarrhea.  ECOG FS:1 - Symptomatic but completely ambulatory  Review of systems- Review of Systems  Constitutional: Negative.  Negative for chills, fever, malaise/fatigue and weight loss.  HENT: Negative for congestion, ear pain and tinnitus.   Eyes: Negative.  Negative for blurred vision and double vision.  Respiratory: Negative.  Negative for cough, sputum production and shortness of breath.   Cardiovascular: Negative.  Negative for chest pain, palpitations and leg swelling.  Gastrointestinal: Negative.  Negative for abdominal pain, constipation, diarrhea, nausea and vomiting.  Genitourinary: Negative for dysuria, frequency and urgency.  Musculoskeletal: Negative for back pain and falls.  Skin: Positive for rash (Finger tips/cuticles).  Neurological: Negative.  Negative for weakness and headaches.  Endo/Heme/Allergies: Negative.  Does not bruise/bleed easily.  Psychiatric/Behavioral: Negative.  Negative for depression. The patient is not nervous/anxious and does not have insomnia.      Current treatment-Tagrisso  No Known Allergies   Past Medical History:  Diagnosis Date  . Cancer ([place]) 1989   cervical  . Depression   . Family history of breast cancer   . Family history of colon cancer   .  Family history of ovarian cancer   . Family history of stomach cancer   . Family history of throat cancer   . Hyperlipidemia   . Hypertension   . Malignant neoplasm of lung ([place]) 03/01/2018  . Thyroid disease       Past Surgical History:  Procedure Laterality Date  . BILATERAL CARPAL TUNNEL RELEASE    . BREAST CYST ASPIRATION Left   . LUNG BIOPSY    . PARTIAL HYSTERECTOMY    . PORTA CATH INSERTION N/A 03/03/2018   Procedure: PORTA CATH INSERTION;  Surgeon: Katha Cabal, MD;  Location: [facility];  Service: Cardiovascular;  Laterality: N/A;  . TUBAL LIGATION      Social History   Socioeconomic History  . Marital status: Single    Spouse name: Not on file  . Number of children: 3  . Years of education: Not on file  . Highest education level: Not on file  Occupational History  . Not on file  Tobacco Use  . Smoking status: Former Smoker    Packs/day: 1.00    Years: 25.00    Pack years: 25.00    Types: Cigarettes    Quit date: 11/19/2017    Years since quitting: 1.8  . Smokeless tobacco: Never Used  Substance and Sexual Activity  . Alcohol use: No  . Drug use: Yes    Types: Marijuana    Comment: occasional  . Sexual activity: Not on file  Other Topics Concern  . Not on file  Social History Narrative  . Not on file   Social Determinants of Health   Financial Resource Strain:   . Difficulty of Paying Living Expenses:   Food Insecurity:   . Worried About Charity fundraiser in the Last Year:   . Arboriculturist in the Last Year:   Transportation Needs:   . Film/video editor (Medical):   Marland Kitchen Lack of Transportation (Non-Medical):   Physical Activity:   . Days of Exercise per Week:   . Minutes of Exercise per Session:   Stress:   . Feeling of Stress :   Social Connections:   . Frequency of Communication with Friends and Family:   . Frequency of Social Gatherings with Friends and Family:   . Attends Religious Services:   . Active Member of Clubs or Organizations:   . Attends Archivist Meetings:   Marland Kitchen Marital Status:   Intimate Partner Violence:   . Fear of Current or Ex-Partner:   . Emotionally Abused:   Marland Kitchen Physically Abused:   . Sexually  Abused:     Family History  Problem Relation Age of Onset  . Colon cancer Mother 4  . Colon cancer Maternal Grandmother 78  . Hypertension Father   . Breast cancer Sister 73  . Throat cancer Brother   . Heart attack Maternal Aunt   . Ovarian cancer Maternal Aunt 70  . Stomach cancer Paternal Grandmother        dx >50  . Throat cancer Brother      Current Outpatient Medications:  .  ALPRAZolam (XANAX) 0.25 MG tablet, Take 1 tablet (0.25 mg total) by mouth at bedtime as needed. for anxiety, Disp: 30 tablet, Rfl: 5 .  aspirin EC 81 MG tablet, Take 81 mg by mouth daily., Disp: , Rfl:  .  atorvastatin (LIPITOR) 10 MG tablet, Take 1 tablet (10 mg total) by mouth daily., Disp: 90 tablet, Rfl: 1 .  busPIRone (BUSPAR)  5 MG tablet, Take 1 tablet (5 mg total) by mouth daily., Disp: 90 tablet, Rfl: 1 .  clopidogrel (PLAVIX) 75 MG tablet, Take 1 tablet (75 mg total) by mouth daily., Disp: 90 tablet, Rfl: 1 .  dexamethasone (DECADRON) 4 MG tablet, Take 1 tablet (4 mg total) by mouth 2 (two) times daily., Disp: 28 tablet, Rfl: 1 .  FLUoxetine (PROZAC) 10 MG capsule, Take 1 capsule (10 mg total) by mouth daily., Disp: 90 capsule, Rfl: 1 .  hydrOXYzine (ATARAX/VISTARIL) 10 MG tablet, Take 1 tablet (10 mg total) by mouth daily., Disp: 30 tablet, Rfl: 5 .  levETIRAcetam (KEPPRA) 500 MG tablet, Take 1 tablet (500 mg total) by mouth 2 (two) times daily., Disp: 60 tablet, Rfl: 2 .  levothyroxine (SYNTHROID) 112 MCG tablet, Take 1 tablet (112 mcg total) by mouth daily., Disp: 90 tablet, Rfl: 1 .  lidocaine (XYLOCAINE) 2 % solution, Use as directed 15 mLs in the mouth or throat as needed for mouth pain., Disp: 100 mL, Rfl: 0 .  lisinopril-hydrochlorothiazide (ZESTORETIC) 20-25 MG tablet, Take 1 tablet by mouth daily., Disp: 90 tablet, Rfl: 0 .  Multiple Vitamins-Minerals (MULTIVITAMIN ADULT PO), Take 1 tablet by mouth daily. , Disp: , Rfl:  .  nystatin cream (MYCOSTATIN), Apply 1 application topically 2  (two) times daily., Disp: 30 g, Rfl: 0 .  olopatadine (PATANOL) 0.1 % ophthalmic solution, Place 1 drop into both eyes 2 (two) times daily., Disp: 5 mL, Rfl: 12 .  pantoprazole (PROTONIX) 20 MG tablet, Take 1 tablet (20 mg total) by mouth daily., Disp: 30 tablet, Rfl: 1 .  umeclidinium-vilanterol (ANORO ELLIPTA) 62.5-25 MCG/INH AEPB, Inhale 1 puff into the lungs daily., Disp: 60 each, Rfl: 5 .  valACYclovir (VALTREX) 1000 MG tablet, Take 1 tablet (1,000 mg total) by mouth 3 (three) times daily., Disp: 21 tablet, Rfl: 1 .  VENTOLIN HFA 108 (90 Base) MCG/ACT inhaler, Inhale 1-2 puffs into the lungs every 6 (six) hours as needed for wheezing or shortness of breath., Disp: 18 g, Rfl: 1 .  chlorhexidine (PERIDEX) 0.12 % solution, Place 15-32ml of solution in warm water and soak fingers QID., Disp: 473 mL, Rfl: 0 .  lidocaine (XYLOCAINE) 2 % solution, Use as directed 15 mLs in the mouth or throat as needed for mouth pain., Disp: 100 mL, Rfl: 0 .  meclizine (ANTIVERT) 25 MG tablet, Take 1 tablet (25 mg total) by mouth 3 (three) times daily as needed for dizziness. (Patient not taking: Reported on 09/13/2019), Disp: 30 tablet, Rfl: 0 .  mupirocin ointment (BACTROBAN) 2 %, Apply 1 application topically 2 (two) times daily., Disp: 22 g, Rfl: 0 .  osimertinib mesylate (TAGRISSO) 80 MG tablet, Take 1 tablet (80 mg total) by mouth daily., Disp: 30 tablet, Rfl: 2 .  sucralfate (CARAFATE) 1 g tablet, Take 1 tablet (1 g total) by mouth 4 (four) times daily -  with meals and at bedtime. (Patient not taking: Reported on 09/13/2019), Disp: 30 tablet, Rfl: 1 .  triamcinolone cream (KENALOG) 0.1 %, Apply 1 application topically 2 (two) times daily. (Patient not taking: Reported on 09/13/2019), Disp: 30 g, Rfl: 1  Physical exam:  Vitals:   09/13/19 1508 09/13/19 1516  BP:  130/69  Pulse:  (!) 110  Temp:  (!) 97.3 F (36.3 C)  TempSrc:  Tympanic  Weight: 222 lb 10.6 oz (101 kg)    Physical Exam Constitutional:       Appearance: Normal appearance.  HENT:  Head: Normocephalic and atraumatic.  Eyes:     Pupils: Pupils are equal, round, and reactive to light.  Cardiovascular:     Rate and Rhythm: Normal rate and regular rhythm.     Heart sounds: Normal heart sounds. No murmur.  Pulmonary:     Effort: Pulmonary effort is normal.     Breath sounds: Normal breath sounds. No wheezing.  Abdominal:     General: Bowel sounds are normal. There is no distension.     Palpations: Abdomen is soft.     Tenderness: There is no abdominal tenderness.  Musculoskeletal:        General: Normal range of motion.     Cervical back: Normal range of motion.  Skin:    General: Skin is warm and dry.     Findings: No rash.  Neurological:     Mental Status: She is alert and oriented to person, place, and time.  Psychiatric:        Judgment: Judgment normal.    Media Information   Document Information  Photos    09/13/2019 15:38  Attached To:  Office Visit on 09/13/19 with Jacquelin Hawking, NP  Source Information  Jacquelin Hawking, NP  [facility]   Media Information   Document Information  Photos    09/13/2019 15:38  Attached To:  Office Visit on 09/13/19 with Jacquelin Hawking, NP  Source Information  Jacquelin Hawking, NP  [place]     CMP Latest Ref Rng & Units 09/14/2019  Glucose 70 - 99 mg/dL 100(H)  BUN 8 - 23 mg/dL 23  Creatinine 0.44 - 1.00 mg/dL 0.96  Sodium 135 - 145 mmol/L 130(L)  Potassium 3.5 - 5.1 mmol/L 4.3  Chloride 98 - 111 mmol/L 96(L)  CO2 22 - 32 mmol/L 24  Calcium 8.9 - 10.3 mg/dL 8.7(L)  Total Protein 6.5 - 8.1 g/dL -  Total Bilirubin 0.3 - 1.2 mg/dL -  Alkaline Phos 38 - 126 U/L -  AST 15 - 41 U/L -  ALT 0 - 44 U/L -   CBC Latest Ref Rng & Units 09/08/2019  WBC 4.0 - 10.5 K/uL 14.0(H)  Hemoglobin 12.0 - 15.0 g/dL 14.5  Hematocrit 36.0 - 46.0 % 44.0  Platelets 150 - 400 K/uL 234    No images are attached to the encounter.  MR Brain W Wo Contrast  Result  Date: 08/31/2019 CLINICAL DATA:  64 year old female with lung cancer and solitary right frontal metastasis by MRI last month. SRS treatment planning. EXAM: MRI HEAD WITHOUT AND WITH CONTRAST TECHNIQUE: Multiplanar, multiecho pulse sequences of the brain and surrounding structures were obtained without and with intravenous contrast. CONTRAST:  52mL GADAVIST GADOBUTROL 1 MMOL/ML IV SOLN COMPARISON:  Brain MRI 08/03/2019. brain MRI 03/10/2018. FINDINGS: BRAIN Lobulated and enhancing peripherally located mass at the frontal operculum now measures up to 28 mm diameter and is inseparable from the overlying dura with mild adjacent dural thickening as before. However, this does appear to be intra-axial, with no definite cleft visible on T2 imaging. Regional T2 and FLAIR hyperintensity in keeping with vasogenic edema has mildly regressed. No midline shift. Mildly improved patency of the right lateral ventricle since last month. No other enhancing brain lesion or cerebral edema. No other dural thickening. Anterior left frontal lobe developmental venous anomaly (normal variant). Other Brain findings: No restricted diffusion to suggest acute infarction. No ventriculomegaly, extra-axial collection or acute intracranial hemorrhage. Cervicomedullary junction and pituitary are within normal limits. Axial images raise the possibility  of bilateral IAC fundus enhancement (such as series 14, image 59 on the right), but this is stable since 2019 and also not definitely correlated on the coronal or sagittal postcontrast images. Vascular: Major intracranial vascular flow voids are stable. The major dural venous sinuses are enhancing and appear to be patent. Skull and upper cervical spine: Negative visible cervical spine and spinal cord. Bone marrow signal is stable and within normal limits. Sinuses/Orbits: Negative orbits. Paranasal Visualized paranasal sinuses and mastoids are stable and well pneumatized. Other: Scalp and face soft  tissues appear negative. IMPRESSION: 1. Solitary enhancing mass at the right frontal operculum now measures up to 28 mm diameter. As before this is inseparable from the overlying dura which appears mildly thickened, although I favor this is an intra-axial rather than extra-axial metastasis. 2. Mild regression of vasogenic edema and mass effect since last month. No midline shift. 3. No new metastatic disease identified. Electronically Signed   By: Genevie Ann M.D.   On: 08/31/2019 12:50     Assessment and plan- Patient is a 64 y.o. female who presents to symptom management for concerns of paronychia.  Symptoms developed approximately 1 week ago and have become more painful.  Reviewed side effects from [place].  According to up-to-date, Tagrisso can cause nail disease (35-37%) including leukonychia, nailbed changes, nail discoloration, nail hyperpigmentation, onychoclasis, onycholysis, onychomadesis and paronychia.  Assessment consistent with paronychia without abscess involving several fingers causing cracking and erythema.  Treatment usually consist of local skin care measures, topical or oral antibiotics and surgical modalities depending on severity.  Given her case is very mild, would recommend topical antibiotics and warm water antiseptic soap with chlorhexidine multiple times per day and Bactroban ointment after each warm soak.  Plan: Rx chlorhexidine topical solution.  Rx Bactroban ointment or triple antibiotic ointment.  Soak several times per day lasting approximately 10 to 15 minutes per soak. Apply ointment to dry fingertips.  Can apply gloves after. Recommend imaging of right ankle given injury and swelling.  Disposition: RTC as scheduled to see Dr. Mike Gip.   Visit Diagnosis 1. Paronychia of finger, unspecified laterality     Patient expressed understanding and was in agreement with this plan. She also understands that She can call clinic at any time with any questions, concerns, or  complaints.   Greater than 50% was spent in counseling and coordination of care with this patient including but not limited to discussion of the relevant topics above (See A&P) including, but not limited to diagnosis and management of acute and chronic medical conditions.   Thank you for allowing me to participate in the care of this very pleasant patient.    Jacquelin Hawking, NP [place] at [facility] Cell - [phone] Pager- [phone] 09/16/2019 1:30 PM

## 2019-09-13 NOTE — Telephone Encounter (Signed)
[facility] in [place] states that patient picked up rx for valacyclovir on 09/09/19

## 2019-09-14 ENCOUNTER — Inpatient Hospital Stay: Payer: 59

## 2019-09-14 ENCOUNTER — Other Ambulatory Visit: Payer: Self-pay | Admitting: Oncology

## 2019-09-14 DIAGNOSIS — R109 Unspecified abdominal pain: Secondary | ICD-10-CM

## 2019-09-14 DIAGNOSIS — C3432 Malignant neoplasm of lower lobe, left bronchus or lung: Secondary | ICD-10-CM | POA: Diagnosis not present

## 2019-09-14 LAB — URINALYSIS, COMPLETE (UACMP) WITH MICROSCOPIC
Bacteria, UA: NONE SEEN
Bilirubin Urine: NEGATIVE
Glucose, UA: NEGATIVE mg/dL
Hgb urine dipstick: NEGATIVE
Ketones, ur: NEGATIVE mg/dL
Leukocytes,Ua: NEGATIVE
Nitrite: NEGATIVE
Protein, ur: NEGATIVE mg/dL
Specific Gravity, Urine: 1.025 (ref 1.005–1.030)
pH: 5 (ref 5.0–8.0)

## 2019-09-14 LAB — BASIC METABOLIC PANEL
Anion gap: 10 (ref 5–15)
BUN: 23 mg/dL (ref 8–23)
CO2: 24 mmol/L (ref 22–32)
Calcium: 8.7 mg/dL — ABNORMAL LOW (ref 8.9–10.3)
Chloride: 96 mmol/L — ABNORMAL LOW (ref 98–111)
Creatinine, Ser: 0.96 mg/dL (ref 0.44–1.00)
GFR calc Af Amer: >60 mL/min (ref >60)
GFR calc non Af Amer: >60 mL/min (ref >60)
Glucose, Bld: 100 mg/dL — ABNORMAL HIGH (ref 70–99)
Potassium: 4.3 mmol/L (ref 3.5–5.1)
Sodium: 130 mmol/L — ABNORMAL LOW (ref 135–145)

## 2019-09-14 NOTE — Progress Notes (Signed)
Re: Flank Pain  Mrs. Chopra is a 64 year old female with past medical history significant for stage IV lung cancer with metastatic disease to the brain status post radiation therapy and currently on Tagrisso who complains of flank pain.  She was evaluated yesterday in clinic for paronychia of bilateral hands.  She was instructed to use Bactroban or triple antibiotic cream and soak several times daily in chlorhexidine and warm water solution.  Prescriptions called to pharmacy.  Patient calls clinic this morning stating she has developed flank pain.  She is currently being treated for shingles of the right buttocks.  She is taking Valtrex 1000 mg daily.  States she is taken 11 tablets.  She is concerned that the Valtrex is "killing her kidneys".  We will have her come to clinic for lab work (BMP and urinalysis).  Patient is agreeable.  Labs ordered.   [name], NP 09/14/2019 9:42 AM

## 2019-09-15 ENCOUNTER — Telehealth: Payer: Self-pay | Admitting: *Deleted

## 2019-09-15 ENCOUNTER — Other Ambulatory Visit: Payer: Self-pay | Admitting: Oncology

## 2019-09-15 MED ORDER — LIDOCAINE VISCOUS HCL 2 % MT SOLN: 15.0000 mL | OROMUCOSAL | 0 refills | Status: DC | PRN

## 2019-09-15 MED ORDER — OSIMERTINIB MESYLATE 80 MG PO TABS: 80.0000 mg | ORAL_TABLET | Freq: Every day | ORAL | 2 refills | Status: DC

## 2019-09-15 NOTE — Telephone Encounter (Signed)
-----   Message from Secundino Ginger sent at 09/15/2019  3:05 PM EDT ----- Regarding: Medication Contact: [phone] This patient is having all kinds of trouble getting her medication. [facility] will no longer fill it and they need some info so they can transfer it. This patient has info but can't get anyone to help her. She only has enough pills until Sunday. Can someone call her

## 2019-09-15 NOTE — Telephone Encounter (Signed)
-----   Message from Secundino Ginger sent at 09/15/2019  3:05 PM EDT ----- Regarding: Medication Contact: [phone] This patient is having all kinds of trouble getting her medication. [facility] will no longer fill it and they need some info so they can transfer it. This patient has info but can't get anyone to help her. She only has enough pills until Sunday. Can someone call her

## 2019-09-15 NOTE — Telephone Encounter (Signed)
Alyson/Judy. Please advise regarding patient's Tagrisso.

## 2019-09-15 NOTE — Addendum Note (Signed)
Addended by: Darl Pikes on: 09/15/2019 04:37 PM   Modules accepted: Orders

## 2019-09-15 NOTE — Progress Notes (Signed)
Oral Chemotherapy Pharmacist Encounter   [facility] does not have access to fill Tagrisso. Prescription had to be redirected to [facility].  Darl Pikes, PharmD, BCPS, BCOP, CPP Hematology/Oncology Clinical Pharmacist [facility] [phone]  09/15/2019 4:36 PM

## 2019-09-15 NOTE — Progress Notes (Signed)
Re: Follow-up   Followed up with patient today regarding fingernail and flank pain.  Reviewed lab work with patient.  No signs of infection or kidney concerns.  Patient reluctant to continue her Valtrex because she has googled side effects which state she could experience kidney failure.  I reaffirmed that her kidneys look okay with lab work today.  We also collected a urinalysis which was negative for UTI.  Explained that she needed to complete her 7-day course of 3 g Valtrex daily.  Patient states she is only been taking 1 g daily for the last 2 days.  Reports persistent left lower flank pain where shingles is located.  Patient states shingles are improving and scabbing over.  Pain is likely related to active shingles infection.  Have asked she complete her course of Valtrex to prevent rebound side effects and worsening infection.   Patient understood.  Kristina Casa, NP 09/15/2019 4:09 PM

## 2019-09-15 NOTE — Telephone Encounter (Signed)
Patient called Kristina Abide, NP, and let her know that [facility] said they could not fill the [place] and the medication had to be [facility].  Will have prescription rerouted to [place] Patient [place] Phone [phone] Fax [phone] 09/15/2019 3:57 PM

## 2019-09-15 NOTE — Telephone Encounter (Signed)
Oral Chemotherapy Pharmacist Encounter   During call with Sonia Baller, NP, Bethena Roys was able to find out more about this situation. Patient has had a change in her prescription insurance coverage. Bethena Roys was able to get prescription approved through the new insurance and there require that patient's use their [employer], so the prescription was redirected there.   [facility] does not appear to have access to order/dispense Tagrisso so they require their patient[facility]. The [facility] was [facility] and her patient demographics were faxed to the new pharmacy.  Darl Pikes, PharmD, BCPS, BCOP, CPP Hematology/Oncology Clinical Pharmacist [facility] [phone]  09/15/2019 4:38 PM

## 2019-09-16 ENCOUNTER — Other Ambulatory Visit: Payer: Self-pay | Admitting: Family Medicine

## 2019-09-16 DIAGNOSIS — I739 Peripheral vascular disease, unspecified: Secondary | ICD-10-CM

## 2019-09-16 MED ORDER — CLOPIDOGREL BISULFATE 75 MG PO TABS: 75.0000 mg | ORAL_TABLET | Freq: Every day | ORAL | 0 refills | Status: AC

## 2019-09-16 NOTE — Telephone Encounter (Signed)
Requested medication (s) are due for refill today: yes  Requested medication (s) are on the active medication list: yes  Last refill:  03/03/2019  Future visit scheduled: no  Notes to clinic:  per pharmacy note dated 03/03/2019, "Please send this to Dr Delana Meyer at [facility] for next refill"    Requested Prescriptions  Pending Prescriptions Disp Refills   clopidogrel (PLAVIX) 75 MG tablet 90 tablet 1    Sig: Take 1 tablet (75 mg total) by mouth daily.      Hematology: Antiplatelets - clopidogrel Failed - 09/16/2019 11:21 AM      Failed - Evaluate AST, ALT within 2 months of therapy initiation.      Failed - ALT in normal range and within 360 days    ALT  Date Value Ref Range Status  09/08/2019 51 (H) 0 - 44 U/L Final          Passed - AST in normal range and within 360 days    AST  Date Value Ref Range Status  09/08/2019 35 15 - 41 U/L Final          Passed - HCT in normal range and within 180 days    HCT  Date Value Ref Range Status  09/08/2019 44.0 36.0 - 46.0 % Final   Hematocrit  Date Value Ref Range Status  08/02/2019 38.9 34.0 - 46.6 % Final          Passed - HGB in normal range and within 180 days    Hemoglobin  Date Value Ref Range Status  09/08/2019 14.5 12.0 - 15.0 g/dL Final  08/02/2019 13.4 11.1 - 15.9 g/dL Final          Passed - PLT in normal range and within 180 days    Platelets  Date Value Ref Range Status  09/08/2019 234 150 - 400 K/uL Final  08/02/2019 278 150 - 450 x10E3/uL Final          Passed - Valid encounter within last 6 months    Recent Outpatient Visits           1 month ago Irregularly irregular pulse rhythm   [facility] Juline Patch, MD   1 month ago Essential hypertension   [place], Deanna C, MD   6 months ago Annual physical exam   [facility] Juline Patch, MD   9 months ago Neurodermatitis, localized   [facility] Juline Patch, MD   1 year ago  Fall, initial encounter   [facility] Juline Patch, MD

## 2019-09-16 NOTE — Telephone Encounter (Signed)
Patient requesting clopidogrel (PLAVIX) 75 MG tablet , informed please allow 48 to 72 hour turn around time Please send to   [place], [place] Phone:  [phone]  Fax:  [phone]      Patient states all future medication request please send to [facility] only due to the cost being cheaper.   Patient would like PCP to be aware she had "brain surgery" on Monday 09/12/2019 and all was a success. Patient experiencing shingles and thrush but all is well.

## 2019-09-16 NOTE — Telephone Encounter (Signed)
  Thanks for figuring this out!  M

## 2019-09-20 ENCOUNTER — Other Ambulatory Visit: Payer: BLUE CROSS/BLUE SHIELD

## 2019-09-20 ENCOUNTER — Ambulatory Visit: Payer: BLUE CROSS/BLUE SHIELD | Admitting: Hematology and Oncology

## 2019-09-21 ENCOUNTER — Ambulatory Visit: Payer: BLUE CROSS/BLUE SHIELD | Admitting: Hematology and Oncology

## 2019-09-21 ENCOUNTER — Other Ambulatory Visit: Payer: BLUE CROSS/BLUE SHIELD

## 2019-09-22 ENCOUNTER — Ambulatory Visit: Payer: 59 | Admitting: Hematology and Oncology

## 2019-09-22 ENCOUNTER — Other Ambulatory Visit: Payer: Self-pay

## 2019-09-22 ENCOUNTER — Inpatient Hospital Stay: Payer: 59 | Attending: Hematology and Oncology

## 2019-09-22 DIAGNOSIS — Z8541 Personal history of malignant neoplasm of cervix uteri: Secondary | ICD-10-CM | POA: Diagnosis not present

## 2019-09-22 DIAGNOSIS — C7931 Secondary malignant neoplasm of brain: Secondary | ICD-10-CM | POA: Insufficient documentation

## 2019-09-22 DIAGNOSIS — J449 Chronic obstructive pulmonary disease, unspecified: Secondary | ICD-10-CM | POA: Insufficient documentation

## 2019-09-22 DIAGNOSIS — C801 Malignant (primary) neoplasm, unspecified: Secondary | ICD-10-CM

## 2019-09-22 DIAGNOSIS — Z452 Encounter for adjustment and management of vascular access device: Secondary | ICD-10-CM | POA: Diagnosis not present

## 2019-09-22 DIAGNOSIS — C3490 Malignant neoplasm of unspecified part of unspecified bronchus or lung: Secondary | ICD-10-CM | POA: Diagnosis not present

## 2019-09-22 DIAGNOSIS — C7800 Secondary malignant neoplasm of unspecified lung: Secondary | ICD-10-CM

## 2019-09-22 DIAGNOSIS — F329 Major depressive disorder, single episode, unspecified: Secondary | ICD-10-CM | POA: Diagnosis not present

## 2019-09-22 DIAGNOSIS — Z87891 Personal history of nicotine dependence: Secondary | ICD-10-CM | POA: Diagnosis not present

## 2019-09-22 DIAGNOSIS — B029 Zoster without complications: Secondary | ICD-10-CM | POA: Insufficient documentation

## 2019-09-22 LAB — CBC WITH DIFFERENTIAL/PLATELET
Abs Immature Granulocytes: 0.1 10*3/uL — ABNORMAL HIGH (ref 0.00–0.07)
Basophils Absolute: 0 10*3/uL (ref 0.0–0.1)
Basophils Relative: 0 %
Eosinophils Absolute: 0 10*3/uL (ref 0.0–0.5)
Eosinophils Relative: 0 %
HCT: 33.4 % — ABNORMAL LOW (ref 36.0–46.0)
Hemoglobin: 11.4 g/dL — ABNORMAL LOW (ref 12.0–15.0)
Immature Granulocytes: 1 %
Lymphocytes Relative: 15 %
Lymphs Abs: 1.4 10*3/uL (ref 0.7–4.0)
MCH: 30.7 pg (ref 26.0–34.0)
MCHC: 34.1 g/dL (ref 30.0–36.0)
MCV: 90 fL (ref 80.0–100.0)
Monocytes Absolute: 0.4 10*3/uL (ref 0.1–1.0)
Monocytes Relative: 4 %
Neutro Abs: 7.5 10*3/uL (ref 1.7–7.7)
Neutrophils Relative %: 80 %
Platelets: 250 10*3/uL (ref 150–400)
RBC: 3.71 MIL/uL — ABNORMAL LOW (ref 3.87–5.11)
RDW: 14.8 % (ref 11.5–15.5)
WBC: 9.4 10*3/uL (ref 4.0–10.5)
nRBC: 0 % (ref 0.0–0.2)

## 2019-09-22 LAB — COMPREHENSIVE METABOLIC PANEL
ALT: 38 U/L (ref 0–44)
AST: 25 U/L (ref 15–41)
Albumin: 3.4 g/dL — ABNORMAL LOW (ref 3.5–5.0)
Alkaline Phosphatase: 60 U/L (ref 38–126)
Anion gap: 8 (ref 5–15)
BUN: 20 mg/dL (ref 8–23)
CO2: 25 mmol/L (ref 22–32)
Calcium: 8.6 mg/dL — ABNORMAL LOW (ref 8.9–10.3)
Chloride: 102 mmol/L (ref 98–111)
Creatinine, Ser: 0.78 mg/dL (ref 0.44–1.00)
GFR calc Af Amer: >60 mL/min (ref >60)
GFR calc non Af Amer: >60 mL/min (ref >60)
Glucose, Bld: 147 mg/dL — ABNORMAL HIGH (ref 70–99)
Potassium: 4 mmol/L (ref 3.5–5.1)
Sodium: 135 mmol/L (ref 135–145)
Total Bilirubin: 0.4 mg/dL (ref 0.3–1.2)
Total Protein: 6.3 g/dL — ABNORMAL LOW (ref 6.5–8.1)

## 2019-09-22 LAB — LACTATE DEHYDROGENASE: LDH: 294 U/L — ABNORMAL HIGH (ref 98–192)

## 2019-09-22 LAB — MAGNESIUM: Magnesium: 1.9 mg/dL (ref 1.7–2.4)

## 2019-09-23 ENCOUNTER — Other Ambulatory Visit: Payer: Self-pay | Admitting: Family Medicine

## 2019-09-23 DIAGNOSIS — F419 Anxiety disorder, unspecified: Secondary | ICD-10-CM

## 2019-09-23 LAB — CEA: CEA: 3 ng/mL (ref 0.0–4.7)

## 2019-09-23 MED ORDER — ALPRAZOLAM 0.25 MG PO TABS: 0.2500 mg | ORAL_TABLET | Freq: Every evening | ORAL | 0 refills | Status: AC | PRN

## 2019-09-23 NOTE — Telephone Encounter (Signed)
Medication Refill - Medication: ALPRAZolam (XANAX) 0.25 MG tablet    Preferred Pharmacy (with phone number or street name):  [place], [place] Phone:  [phone]  Fax:  [phone]       Agent: Please be advised that RX refills may take up to 3 business days. We ask that you follow-up with your pharmacy.

## 2019-09-23 NOTE — Telephone Encounter (Signed)
Requested medication (s) are due for refill today - looks like patient should have RF  Requested medication (s) are on the active medication list -yes  Future visit scheduled -no  Last refill: 08/01/19 5RF  Notes to clinic: Request for non delegated Rx  Requested Prescriptions  Pending Prescriptions Disp Refills   ALPRAZolam (XANAX) 0.25 MG tablet 30 tablet 5    Sig: Take 1 tablet (0.25 mg total) by mouth at bedtime as needed. for anxiety      Not Delegated - Psychiatry:  Anxiolytics/Hypnotics Failed - 09/23/2019  8:35 AM      Failed - This refill cannot be delegated      Failed - Urine Drug Screen completed in last 360 days.      Passed - Valid encounter within last 6 months    Recent Outpatient Visits           1 month ago Irregularly irregular pulse rhythm   [facility] Juline Patch, MD   1 month ago Essential hypertension   [facility] Juline Patch, MD   6 months ago Annual physical exam   [facility] Juline Patch, MD   9 months ago Neurodermatitis, localized   [facility] Juline Patch, MD   1 year ago Fall, initial encounter   [place], MD                  Requested Prescriptions  Pending Prescriptions Disp Refills   ALPRAZolam (XANAX) 0.25 MG tablet 30 tablet 5    Sig: Take 1 tablet (0.25 mg total) by mouth at bedtime as needed. for anxiety      Not Delegated - Psychiatry:  Anxiolytics/Hypnotics Failed - 09/23/2019  8:35 AM      Failed - This refill cannot be delegated      Failed - Urine Drug Screen completed in last 360 days.      Passed - Valid encounter within last 6 months    Recent Outpatient Visits           1 month ago Irregularly irregular pulse rhythm   [facility] Juline Patch, MD   1 month ago Essential hypertension   [facility] Juline Patch, MD   6 months ago Annual physical exam   [facility] Juline Patch, MD   9  months ago Neurodermatitis, localized   [facility] Juline Patch, MD   1 year ago Fall, initial encounter   [facility] Juline Patch, MD

## 2019-09-26 ENCOUNTER — Telehealth: Payer: Self-pay

## 2019-09-26 ENCOUNTER — Encounter: Payer: Self-pay | Admitting: Family Medicine

## 2019-09-26 ENCOUNTER — Telehealth: Payer: Self-pay | Admitting: Family Medicine

## 2019-09-26 ENCOUNTER — Other Ambulatory Visit: Payer: Self-pay

## 2019-09-26 ENCOUNTER — Ambulatory Visit (INDEPENDENT_AMBULATORY_CARE_PROVIDER_SITE_OTHER): Payer: 59 | Admitting: Family Medicine

## 2019-09-26 VITALS — BP 120/76 | HR 92 | Ht 69.0 in | Wt 227.0 lb

## 2019-09-26 DIAGNOSIS — B0229 Other postherpetic nervous system involvement: Secondary | ICD-10-CM | POA: Diagnosis not present

## 2019-09-26 DIAGNOSIS — L739 Follicular disorder, unspecified: Secondary | ICD-10-CM | POA: Diagnosis not present

## 2019-09-26 DIAGNOSIS — R601 Generalized edema: Secondary | ICD-10-CM | POA: Diagnosis not present

## 2019-09-26 MED ORDER — NYSTATIN 100000 UNIT/GM EX CREA: 1.0000 "application " | TOPICAL_CREAM | Freq: Two times a day (BID) | CUTANEOUS | 0 refills | Status: DC

## 2019-09-26 MED ORDER — MUPIROCIN 2 % EX OINT: 1.0000 "application " | TOPICAL_OINTMENT | Freq: Two times a day (BID) | CUTANEOUS | 0 refills | Status: DC

## 2019-09-26 MED ORDER — OLOPATADINE HCL 0.2 % OP SOLN: 1.0000 [drp] | Freq: Two times a day (BID) | OPHTHALMIC | 1 refills | Status: AC

## 2019-09-26 MED ORDER — FUROSEMIDE 20 MG PO TABS: 20.0000 mg | ORAL_TABLET | Freq: Every day | ORAL | 0 refills | Status: DC

## 2019-09-26 NOTE — Telephone Encounter (Signed)
Please schedule pt for MD only follow up one day this week or next.

## 2019-09-26 NOTE — Telephone Encounter (Signed)
Copied from [place] [phone]. Topic: General - Other >> Sep 26, 2019  3:54 PM Keene Breath wrote: Reason for CRM: Called to get clarification on medication nystatin cream (MYCOSTATIN).  Wanted to know where the medication is being applied for insurance purposes.  Please advise and call to clarify at [phone]

## 2019-09-26 NOTE — Telephone Encounter (Signed)
Spoke to [facility]

## 2019-09-26 NOTE — Progress Notes (Signed)
Date:  09/26/2019   Name:  Kristina Wright   DOB:  09-Dec-1955   MRN:  [id number]   Chief Complaint: Rash (was treated x 7 days with 3,000mg  of valacyclovir everyday. Has rash between legs that does not look the same as rash on R) buttock. ) and Foot Swelling (is c/o "black rocks" coming out of feet in the water of bathtub)  Rash This is a new problem. The problem has been gradually improving since onset. The affected locations include the right buttock. The rash is characterized by blistering. Pertinent negatives include no congestion, cough, diarrhea, eye pain, fatigue, fever, rhinorrhea, shortness of breath, sore throat or vomiting.    Lab Results  Component Value Date   CREATININE 0.78 09/22/2019   BUN 20 09/22/2019   NA 135 09/22/2019   K 4.0 09/22/2019   CL 102 09/22/2019   CO2 25 09/22/2019   Lab Results  Component Value Date   CHOL 150 08/02/2019   HDL 45 08/02/2019   LDLCALC 84 08/02/2019   TRIG 119 08/02/2019   CHOLHDL 3.5 03/03/2019   Lab Results  Component Value Date   TSH 3.341 06/20/2019   No results found for: HGBA1C Lab Results  Component Value Date   WBC 9.4 09/22/2019   HGB 11.4 (L) 09/22/2019   HCT 33.4 (L) 09/22/2019   MCV 90.0 09/22/2019   PLT 250 09/22/2019   Lab Results  Component Value Date   ALT 38 09/22/2019   AST 25 09/22/2019   ALKPHOS 60 09/22/2019   BILITOT 0.4 09/22/2019     Review of Systems  Constitutional: Negative.  Negative for chills, fatigue, fever and unexpected weight change.  HENT: Negative for congestion, ear discharge, ear pain, rhinorrhea, sinus pressure, sneezing and sore throat.   Eyes: Negative for photophobia, pain, discharge, redness and itching.  Respiratory: Negative for cough, shortness of breath, wheezing and stridor.   Gastrointestinal: Negative for abdominal pain, blood in stool, constipation, diarrhea, nausea and vomiting.  Endocrine: Negative for cold intolerance, heat intolerance, polydipsia, polyphagia  and polyuria.  Genitourinary: Negative for dysuria, flank pain, frequency, hematuria, menstrual problem, pelvic pain, urgency, vaginal bleeding and vaginal discharge.  Musculoskeletal: Negative for arthralgias, back pain and myalgias.  Skin: Positive for rash.  Allergic/Immunologic: Negative for environmental allergies and food allergies.  Neurological: Negative for dizziness, weakness, light-headedness, numbness and headaches.  Hematological: Negative for adenopathy. Does not bruise/bleed easily.  Psychiatric/Behavioral: Negative for dysphoric mood. The patient is not nervous/anxious.     Patient Active Problem List   Diagnosis Date Noted   Brain metastasis ([place]) 08/05/2019   Elevated TSH 06/20/2019   Chronic obstructive pulmonary disease ([place]) 12/13/2018   Left lower lobe pulmonary nodule 11/29/2018   Breast nodule 08/25/2018   Anxiety 08/22/2018   Genetic testing 06/11/2018   Family history of breast cancer    Family history of colon cancer    Family history of ovarian cancer    Family history of stomach cancer    Family history of throat cancer    Metastatic adenocarcinoma to lung with unknown primary site [facility]) 04/27/2018   Depression 03/12/2018   Malignant neoplasm of lung ([place]) 03/01/2018   Goals of care, counseling/discussion 03/01/2018   Tobacco use disorder 02/08/2016   Essential hypertension 02/08/2016   PVD (peripheral vascular disease) ([place]) 02/08/2016   Blue toe syndrome of right lower extremity ([place]) 02/08/2016   Adult BMI > 30 07/31/2015   High risk medication use 07/31/2015   Carpal tunnel  syndrome on both sides 10/30/2014   Postmenopause atrophic vaginitis 05/15/2014   Sensory urge incontinence 05/15/2014   Vitamin D deficiency 04/25/2014   Extremity pain 11/22/2013   Numbness 11/22/2013   Sleep disorder 11/22/2013   Chronic insomnia 11/10/2013   Foot pain, left 11/10/2013   Hypothyroidism 11/10/2013   Mixed hyperlipidemia  11/10/2013    No Known Allergies  Past Surgical History:  Procedure Laterality Date   BILATERAL CARPAL TUNNEL RELEASE     BREAST CYST ASPIRATION Left    LUNG BIOPSY     PARTIAL HYSTERECTOMY     PORTA CATH INSERTION N/A 03/03/2018   Procedure: PORTA CATH INSERTION;  Surgeon: Katha Cabal, MD;  Location: [facility];  Service: Cardiovascular;  Laterality: N/A;   TUBAL LIGATION      Social History   Tobacco Use   Smoking status: Former Smoker    Packs/day: 1.00    Years: 25.00    Pack years: 25.00    Types: Cigarettes    Quit date: 11/19/2017    Years since quitting: 1.8   Smokeless tobacco: Never Used  Substance Use Topics   Alcohol use: No   Drug use: Yes    Types: Marijuana    Comment: occasional     Medication list has been reviewed and updated.  Current Meds  Medication Sig   ALPRAZolam (XANAX) 0.25 MG tablet Take 1 tablet (0.25 mg total) by mouth at bedtime as needed. for anxiety AS NEEDED ONLY!   aspirin EC 81 MG tablet Take 81 mg by mouth daily.   atorvastatin (LIPITOR) 10 MG tablet Take 1 tablet (10 mg total) by mouth daily.   busPIRone (BUSPAR) 5 MG tablet Take 1 tablet (5 mg total) by mouth daily.   clopidogrel (PLAVIX) 75 MG tablet Take 1 tablet (75 mg total) by mouth daily.   FLUoxetine (PROZAC) 10 MG capsule Take 1 capsule (10 mg total) by mouth daily.   hydrOXYzine (ATARAX/VISTARIL) 10 MG tablet Take 1 tablet (10 mg total) by mouth daily.   levothyroxine (SYNTHROID) 112 MCG tablet Take 1 tablet (112 mcg total) by mouth daily.   lidocaine (XYLOCAINE) 2 % solution Use as directed 15 mLs in the mouth or throat as needed for mouth pain.   lidocaine (XYLOCAINE) 2 % solution Use as directed 15 mLs in the mouth or throat as needed for mouth pain.   lisinopril-hydrochlorothiazide (ZESTORETIC) 20-25 MG tablet Take 1 tablet by mouth daily.   Multiple Vitamins-Minerals (MULTIVITAMIN ADULT PO) Take 1 tablet by mouth daily.     mupirocin ointment (BACTROBAN) 2 % Apply 1 application topically 2 (two) times daily.   nystatin cream (MYCOSTATIN) Apply 1 application topically 2 (two) times daily.   olopatadine (PATANOL) 0.1 % ophthalmic solution Place 1 drop into both eyes 2 (two) times daily.   osimertinib mesylate (TAGRISSO) 80 MG tablet Take 1 tablet (80 mg total) by mouth daily.   umeclidinium-vilanterol (ANORO ELLIPTA) 62.5-25 MCG/INH AEPB Inhale 1 puff into the lungs daily.   VENTOLIN HFA 108 (90 Base) MCG/ACT inhaler Inhale 1-2 puffs into the lungs every 6 (six) hours as needed for wheezing or shortness of breath.    PHQ 2/9 Scores 08/01/2019 03/03/2019 12/13/2018 01/07/2018  PHQ - 2 Score 2 0 0 1  PHQ- 9 Score 4 1 1 6     BP Readings from Last 3 Encounters:  09/26/19 120/76  09/13/19 130/69  09/12/19 (!) 176/86    Physical Exam  Wt Readings from Last 3 Encounters:  09/26/19 227 lb (103 kg)  09/13/19 222 lb 10.6 oz (101 kg)  09/08/19 223 lb 7 oz (101.4 kg)    BP 120/76    Pulse 92    Ht 5\' 9"  (1.753 m)    Wt 227 lb (103 kg)    SpO2 98%    BMI 33.52 kg/m   Assessment and Plan: 1. Folliculitis New onset.  Persistent.  Uncontrolled area small circular erythematous areas of both lower legs consistent with folliculitis.  We will initiate Bactroban to these areas (medial aspect of the both lower legs applied to each bite erythematous circular area).  This is to be applied twice a day.  2. Post herpetic neuralgia New onset.  Resolving.  Relatively stable.  There is some erythema on both legs this is consistent with candidiasis.  We will use nystatin cream twice a day only to these areas until they have cleared somewhat and then determine whether there is a bacterial component later  3. Generalized edema New onset.  Persistent.  Gradually worsening.  Patient has generalized swelling of both feet it appears that she keeps both of them in a dependent position during the day.  Patient has a 6 pound weight gain  since last visit and this is consistent with some fluid retention  perhaps due to Decadron or sodium intake.  We will treat with furosemide 20 mg 1 a day for 10 to 20 days and we will recheck patient in 2 weeks with weight and reevaluation.  4.  Candidiasis.  Patient has yeast infection of bilateral medial thighs.  Will apply nystatin to the medial aspects of both thighs twice a day.

## 2019-09-27 ENCOUNTER — Telehealth: Payer: Self-pay | Admitting: Family Medicine

## 2019-09-27 ENCOUNTER — Telehealth: Payer: Self-pay

## 2019-09-27 ENCOUNTER — Other Ambulatory Visit: Payer: Self-pay | Admitting: Oncology

## 2019-09-27 NOTE — Telephone Encounter (Signed)
Copied from [place] [id number]. Topic: General - Inquiry >> Sep 27, 2019  8:41 AM Richardo Priest, NT wrote: Reason for CRM: Patient called in stating she had office visit with PCP yesterday and PCP was aware she was soaking her feet due to a bug bite. Patient drained water over a coffee filter and has noticed multiple bugs in the filter. Patient is inquiring to see if PCP has any recommendations of where she can go get the bugs tested to see what they are or if she should do that. Please advise.

## 2019-09-27 NOTE — Progress Notes (Signed)
Re: Lower extremity swelling and skin concerns  Spoke with Kristina Wright today regarding new concerns of lower extremity swelling and possible bedbugs or ticks embedded in her feet.  Patient states greater than 1 week ago she and her sister attended a church service outside where several of the church members have complained of bug bites on their feet and legs.  She was seen yesterday by Dr. Ronnald Ramp and given a prescription for antibiotic cream for her legs.  She also was found to have a yeast infection in between her upper thighs for which she was given nystatin cream.    Unclear etiology of lower extremity concerns.  She is asking for bugs that she has pulled from her feet to be tested.  I have asked that patient send [place] photos through my chart of her feet and of the bugs.  Spoke with Dr. Ronnald Ramp who is concerned about her mental status, given her history of lung cancer with metastatic disease to the brain.  She is status post brain radiation.   We will touch base with Dr. Mike Gip to see what best course of action to take.  Patient may benefit from additional imaging and steroids.  [name], NP 09/27/2019 4:19 PM

## 2019-09-27 NOTE — Telephone Encounter (Signed)
Called patient regard her rescheduling her 10/04/19 appt. She states that she has a lot going on right now and is trying to "lighten her load" she said she will come in for her port flush on the 15th but would like to see Dr C towards the end of June, after her follow up with Dr Ronnald Ramp on 6/21. She said that she is seeing Dr Ronnald Ramp because she has bugs in her feet, shingles, yeast, eyes watering, headache. She is also helping her daughter move with a new baby.

## 2019-09-27 NOTE — Telephone Encounter (Signed)
I called patient and let her know that we spoke to [place] and per Anderson Malta, someone from her office will call patient today

## 2019-09-28 ENCOUNTER — Telehealth: Payer: Self-pay | Admitting: Oncology

## 2019-09-28 ENCOUNTER — Other Ambulatory Visit: Payer: Self-pay | Admitting: Oncology

## 2019-09-28 DIAGNOSIS — R519 Headache, unspecified: Secondary | ICD-10-CM

## 2019-09-28 DIAGNOSIS — C7931 Secondary malignant neoplasm of brain: Secondary | ICD-10-CM

## 2019-09-28 NOTE — Progress Notes (Signed)
Re: Headaches  Stat MRI of brain. Worried about recurrent/addaitional brain mets.   [name], NP 09/28/2019 2:37 PM

## 2019-09-28 NOTE — Telephone Encounter (Signed)
Re: Headache  Spoke to Dr. Tasia Catchings who is agreeable to see patient after MRI of brain.   MRI of brain will be scheduled once authorized by her insurance.   Patient agreeable.   Appt scheduled for 10/03/2019.   [name], NP 09/28/2019 4:08 PM

## 2019-09-29 ENCOUNTER — Ambulatory Visit
Admission: RE | Admit: 2019-09-29 | Discharge: 2019-09-29 | Disposition: A | Discharge status: Home / Self Care | Payer: 59 | Source: Ambulatory Visit | Attending: Oncology | Admitting: Oncology

## 2019-09-29 ENCOUNTER — Other Ambulatory Visit: Payer: Self-pay

## 2019-09-29 ENCOUNTER — Telehealth: Payer: Self-pay | Admitting: *Deleted

## 2019-09-29 DIAGNOSIS — C7931 Secondary malignant neoplasm of brain: Secondary | ICD-10-CM | POA: Diagnosis not present

## 2019-09-29 DIAGNOSIS — R519 Headache, unspecified: Secondary | ICD-10-CM | POA: Diagnosis present

## 2019-09-29 MED ORDER — GADOBUTROL 1 MMOL/ML IV SOLN
10.0000 mL | Freq: Once | INTRAVENOUS | Status: AC | PRN
  Administered 2019-09-29: 10 mL via INTRAVENOUS

## 2019-09-29 MED ORDER — GADOBUTROL 1 MMOL/ML IV SOLN: 10.0000 mL | Freq: Once | INTRAVENOUS | Status: DC | PRN

## 2019-09-29 NOTE — Telephone Encounter (Signed)
Per Sonia Baller pt had an MRI done on 09/29/19  Pt had appts already sched to [facility] on 10/03/19 for lab/MD per Jenny's request to have 6/14 appt moved to 09/30/19. Called pt and made her aware of her 6/11/21sched appts.

## 2019-09-29 NOTE — Progress Notes (Signed)
Here is her MRI. I am going to move her appt to tomorrow. She was supposed to see you on Monday but I think she may need to see you sooner.   [name], NP 09/29/2019 2:03 PM

## 2019-09-30 ENCOUNTER — Encounter: Payer: Self-pay | Admitting: Oncology

## 2019-09-30 ENCOUNTER — Inpatient Hospital Stay (HOSPITAL_BASED_OUTPATIENT_CLINIC_OR_DEPARTMENT_OTHER): Payer: 59 | Admitting: Oncology

## 2019-09-30 ENCOUNTER — Inpatient Hospital Stay: Payer: 59

## 2019-09-30 VITALS — BP 138/82 | HR 116 | Temp 97.7°F | Resp 18 | Wt 227.3 lb

## 2019-09-30 DIAGNOSIS — B029 Zoster without complications: Secondary | ICD-10-CM | POA: Diagnosis not present

## 2019-09-30 DIAGNOSIS — C801 Malignant (primary) neoplasm, unspecified: Secondary | ICD-10-CM

## 2019-09-30 DIAGNOSIS — C7931 Secondary malignant neoplasm of brain: Secondary | ICD-10-CM

## 2019-09-30 DIAGNOSIS — C7800 Secondary malignant neoplasm of unspecified lung: Secondary | ICD-10-CM

## 2019-09-30 DIAGNOSIS — Z7189 Other specified counseling: Secondary | ICD-10-CM

## 2019-09-30 DIAGNOSIS — C3490 Malignant neoplasm of unspecified part of unspecified bronchus or lung: Secondary | ICD-10-CM | POA: Diagnosis not present

## 2019-09-30 DIAGNOSIS — Z95828 Presence of other vascular implants and grafts: Secondary | ICD-10-CM | POA: Diagnosis not present

## 2019-09-30 MED ORDER — HEPARIN SOD (PORK) LOCK FLUSH 100 UNIT/ML IV SOLN
INTRAVENOUS | Status: AC
  Filled 2019-09-30: qty 5

## 2019-09-30 MED ORDER — DEXAMETHASONE 4 MG PO TABS: 4.0000 mg | ORAL_TABLET | Freq: Two times a day (BID) | ORAL | 1 refills | Status: DC

## 2019-09-30 MED ORDER — FUROSEMIDE 20 MG PO TABS: 20.0000 mg | ORAL_TABLET | Freq: Every day | ORAL | 1 refills | Status: DC

## 2019-09-30 MED ORDER — LEVETIRACETAM 500 MG PO TABS: 500.0000 mg | ORAL_TABLET | Freq: Two times a day (BID) | ORAL | 2 refills | Status: AC

## 2019-09-30 MED ORDER — SODIUM CHLORIDE 0.9% FLUSH
10.0000 mL | Freq: Once | INTRAVENOUS | Status: AC
  Administered 2019-09-30: 10 mL via INTRAVENOUS
  Filled 2019-09-30: qty 10

## 2019-09-30 MED ORDER — HEPARIN SOD (PORK) LOCK FLUSH 100 UNIT/ML IV SOLN
500.0000 [IU] | Freq: Once | INTRAVENOUS | Status: AC
  Administered 2019-09-30: 500 [IU] via INTRAVENOUS
  Filled 2019-09-30: qty 5

## 2019-09-30 NOTE — Progress Notes (Signed)
Patietn here for follow up. Pt reports having bad shooting headaches for a couple weeks. Pt has swelling to both legs. Pt currenlty on Tagrisso- restarted cycle 6/5. Pt reports checking HR at home and it has been above 100 for approx 1 week. Ensrue/boost provided to patient per request.

## 2019-09-30 NOTE — Progress Notes (Signed)
Hematology/Oncology follow up note [facility] [phone] [phone]   Patient Care Team: Juline Patch, MD as PCP - General (Family Medicine) Lequita Asal, MD as Referring Physician (Hematology and Oncology) Schnier, Dolores Lory, MD (Vascular Surgery) Noreene Filbert, MD as Radiation Oncologist (Radiation Oncology)  REFERRING PROVIDER: Juline Patch, MD REASON FOR VISIT:  Follow-up on lung cancer management.  HISTORY OF PRESENTING ILLNESS:  Kristina MCKENNY is a  64 y.o.  female with PMH listed below who presents to re-establish care with me for management of Stage IV lung adenocarcinoma.  She has a history of 30-year pack smoking history, former smoker.  Significant family history of cancer.  Her sister follows up with me for breast cancer.  Other family member cancer history listed in the family history section.  Self-report history of cervical cancer in 1989.  INTERVAL HISTORY Kristina Wright is a 64 y.o. female who has above history reviewed by me today present for reestablish care. Patient was last seen by me on 04/08/2018.  At that time she transferred her care to my colleague Dr. Mike Gip.  Patient recently called and prefers to transfer her care back to me.  Extensive medical records review was performed by me.  I also discussed with Dr. Mike Gip via Secure chat. #02/24/2018, right axillary lymph node biopsy showed adenocarcinoma  compatible with lung primary.  Stage IV lung adenocarcinoma  initially cT1N3 M1 + EGFR L861Q. Mutation, PDL-1 60% started osimertinib 03/25/2018.  03/10/2018 brain MRI showed no brain metastasis  #06/17/2018 CT chest showed marked interval reduction of mediastinal and right hilar lymphadenopathy.  Slight increase of left lower lobe pulmonary nodule-the nodule was not hypermetabolic on previous PET scan. #CT 08/23/2018 revealed a stable 10 mm left lower lobe no nodule.  Tree-in-bud nodularity seen in the  medial right upper lobe on the previous study likely improved.  Stable 14 mm left breast nodule. #11/24/2018, chest CT reviewed enlarging left lower lobe pulmonary nodule, 1.5 x 1.4 cm concerning for progressive malignancy. Tissue diagnosis was recommended and patient was referred to see pulmonology at that point.   PET 12/29/2018 showed left lower lobe superior segment nodule has SUV of 8, highly suspicious for carcinoma.  Remainder of the adenocarcinoma appears to be responding well.    Patient declined biopsy.  Patient was referred to establish care with Dr. Baruch Gouty ball 01/31/2019 for consideration of SBRT.  Patient received SBRT to left lower lobe nodule 02/03/2019-02/28/2019, Patient was continued on osimertinib. 04/20/2019 CT chest showed interval reduction of the left lower lobe nodule following stereotactic body radiation treatments.  No new or progressive malignancy. 07/20/2019 CT chest reviewed continued evolutionary changes secondary to radiation involving the lingula and superior segment of the left lower lobe.  No specific findings to suggest residual or recurrent tumor. Patient has headache.  08/03/2019, patient presented to emergency room after developing partial seizure episodes CT head concerning for hyperdense frontal lesion concerning for metastasis. 08/03/2019, MRI brain with and without contrast showed right frontal mass likely reflecting metastasis.  Significant surrounding edema with mild regional mass-effect including subcentimeter leftward midline shift. Patient was seen by neurosurgery while she was in the ER.  Patient was started on Decadron and Keppra. Surgery versus stereotactic radio surgery was discussed.  Patient was advised not to drive. 08/09/2019, restaging CT scan showed chest CT stable since 07/20/2019 without new or progressive findings.  12 mm short axis left hilar node is probably stable since prior study although difficult access  given lack of intravenous contrast on  the earlier exams.  Close attention on follow-up.  No findings to suggest metastatic disease in the abdomen/pelvis.  2.5 cm cystic lesion left ovary.  Not hypermetabolic on previous PET scan  #08/23/2019 MRI brain with and without contrast showed a solitary enhancing mass at the right frontal lobe now measuring 2.8 cm.  Inseparable from overlying dura which appears mildly thickened.  Favoring intra-axial rather than extra-axial metastasis.  Mild regression of vasogenic edema and mass-effect since April 2021 study.  #Patient underwent stereotactic radiation surgery on 09/12/2019. Currently patient has been tapered off dexamethasone.  She continues on Keppra.  Patient has also developed shingles and thrush which has been treated symptomatically.   She was recently seen by nurse practitioner [name].  She also has called reporting concerns of lower extremity swelling and skin concerns. Patient complained about bites on her feet and legs.  Patient was seen by primary care provider Dr. Ronnald Ramp and was given a prescription of nystatin cream for her legs.   Stat MRI of the brain was done on 09/29/2019 which showed interval increasing enhancement at the site of solitary right frontal operculum metastasis now encompassing region of 3.4 cm.  As before the mass is inseparable from the overlying dura, favoring intraaxial.  Surrounding edema is also notably increased. Patient prefers to switch her care back to me for further evaluation.  Today she reports having headache recently.  She takes Tylenol as needed for the headache.  Today headache has improved.  She is currently not on dexamethasone.  She reports taking Keppra.  No additional seizure activities. Also she has bilateral lower extremity swelling and erythema.  She applied nystatin cream on her upper thigh for possible yeast infection.  She also reports seeing bugs on her feet.    Review of Systems  Constitutional: Positive for malaise/fatigue.  Negative for chills, fever and weight loss.  HENT: Negative for nosebleeds and sore throat.   Eyes: Negative for double vision, photophobia and redness.  Respiratory: Negative for cough, shortness of breath and wheezing.   Cardiovascular: Positive for leg swelling. Negative for chest pain, palpitations and orthopnea.  Gastrointestinal: Negative for abdominal pain, blood in stool, nausea and vomiting.  Genitourinary: Negative for dysuria.  Musculoskeletal: Negative for back pain, myalgias and neck pain.  Skin: Negative for itching and rash.       Bilateral upper lower extremity/inner thigh erythematous changes.  Neurological: Positive for headaches. Negative for dizziness, tingling and tremors.  Endo/Heme/Allergies: Negative for environmental allergies. Does not bruise/bleed easily.  Psychiatric/Behavioral: Positive for hallucinations. Negative for depression. The patient is nervous/anxious.     MEDICAL HISTORY:  Past Medical History:  Diagnosis Date  . Cancer ([place]) 1989   cervical  . Depression   . Family history of breast cancer   . Family history of colon cancer   . Family history of ovarian cancer   . Family history of stomach cancer   . Family history of throat cancer   . Hyperlipidemia   . Hypertension   . Malignant neoplasm of lung ([place]) 03/01/2018  . Thyroid disease     SURGICAL HISTORY: Past Surgical History:  Procedure Laterality Date  . BILATERAL CARPAL TUNNEL RELEASE    . BREAST CYST ASPIRATION Left   . LUNG BIOPSY    . PARTIAL HYSTERECTOMY    . PORTA CATH INSERTION N/A 03/03/2018   Procedure: PORTA CATH INSERTION;  Surgeon: Katha Cabal, MD;  Location: [facility];  Service: Cardiovascular;  Laterality: N/A;  . TUBAL LIGATION      SOCIAL HISTORY: Social History   Socioeconomic History  . Marital status: Single    Spouse name: Not on file  . Number of children: 3  . Years of education: Not on file  . Highest education level: Not on file    Occupational History  . Not on file  Tobacco Use  . Smoking status: Former Smoker    Packs/day: 1.00    Years: 25.00    Pack years: 25.00    Types: Cigarettes    Quit date: 11/19/2017    Years since quitting: 1.8  . Smokeless tobacco: Never Used  Vaping Use  . Vaping Use: Never used  Substance and Sexual Activity  . Alcohol use: No  . Drug use: Yes    Types: Marijuana    Comment: occasional  . Sexual activity: Not on file  Other Topics Concern  . Not on file  Social History Narrative  . Not on file   Social Determinants of Health   Financial Resource Strain:   . Difficulty of Paying Living Expenses:   Food Insecurity:   . Worried About Charity fundraiser in the Last Year:   . Arboriculturist in the Last Year:   Transportation Needs:   . Film/video editor (Medical):   Marland Kitchen Lack of Transportation (Non-Medical):   Physical Activity:   . Days of Exercise per Week:   . Minutes of Exercise per Session:   Stress:   . Feeling of Stress :   Social Connections:   . Frequency of Communication with Friends and Family:   . Frequency of Social Gatherings with Friends and Family:   . Attends Religious Services:   . Active Member of Clubs or Organizations:   . Attends Archivist Meetings:   Marland Kitchen Marital Status:   Intimate Partner Violence:   . Fear of Current or Ex-Partner:   . Emotionally Abused:   Marland Kitchen Physically Abused:   . Sexually Abused:     FAMILY HISTORY: Family History  Problem Relation Age of Onset  . Colon cancer Mother 104  . Colon cancer Maternal Grandmother 78  . Hypertension Father   . Breast cancer Sister 26  . Throat cancer Brother   . Heart attack Maternal Aunt   . Ovarian cancer Maternal Aunt 70  . Stomach cancer Paternal Grandmother        dx >50  . Throat cancer Brother     ALLERGIES:  has No Known Allergies.  MEDICATIONS:  Current Outpatient Medications  Medication Sig Dispense Refill  . ALPRAZolam (XANAX) 0.25 MG tablet Take 1  tablet (0.25 mg total) by mouth at bedtime as needed. for anxiety AS NEEDED ONLY! 30 tablet 0  . aspirin EC 81 MG tablet Take 81 mg by mouth daily.    Marland Kitchen atorvastatin (LIPITOR) 10 MG tablet Take 1 tablet (10 mg total) by mouth daily. 90 tablet 1  . busPIRone (BUSPAR) 5 MG tablet Take 1 tablet (5 mg total) by mouth daily. 90 tablet 1  . chlorhexidine (PERIDEX) 0.12 % solution Place 15-66m of solution in warm water and soak fingers QID. 473 mL 0  . clopidogrel (PLAVIX) 75 MG tablet Take 1 tablet (75 mg total) by mouth daily. 90 tablet 0  . FLUoxetine (PROZAC) 10 MG capsule Take 1 capsule (10 mg total) by mouth daily. 90 capsule 1  . hydrOXYzine (ATARAX/VISTARIL) 10 MG tablet Take 1 tablet (  10 mg total) by mouth daily. 30 tablet 5  . levothyroxine (SYNTHROID) 112 MCG tablet Take 1 tablet (112 mcg total) by mouth daily. 90 tablet 1  . lisinopril-hydrochlorothiazide (ZESTORETIC) 20-25 MG tablet Take 1 tablet by mouth daily. 90 tablet 0  . Multiple Vitamins-Minerals (MULTIVITAMIN ADULT PO) Take 1 tablet by mouth daily.     . mupirocin ointment (BACTROBAN) 2 % Apply 1 application topically 2 (two) times daily. 22 g 0  . nystatin cream (MYCOSTATIN) Apply 1 application topically 2 (two) times daily. 30 g 0  . osimertinib mesylate (TAGRISSO) 80 MG tablet Take 1 tablet (80 mg total) by mouth daily. 30 tablet 2  . umeclidinium-vilanterol (ANORO ELLIPTA) 62.5-25 MCG/INH AEPB Inhale 1 puff into the lungs daily. 60 each 5  . VENTOLIN HFA 108 (90 Base) MCG/ACT inhaler Inhale 1-2 puffs into the lungs every 6 (six) hours as needed for wheezing or shortness of breath. 18 g 1  . dexamethasone (DECADRON) 4 MG tablet Take 1 tablet (4 mg total) by mouth 2 (two) times daily. 28 tablet 1  . furosemide (LASIX) 20 MG tablet Take 1 tablet (20 mg total) by mouth daily. 30 tablet 1  . levETIRAcetam (KEPPRA) 500 MG tablet Take 1 tablet (500 mg total) by mouth 2 (two) times daily. 60 tablet 2  . lidocaine (XYLOCAINE) 2 %  solution Use as directed 15 mLs in the mouth or throat as needed for mouth pain. (Patient not taking: Reported on 09/30/2019) 100 mL 0  . Olopatadine HCl 0.2 % SOLN Apply 1 drop to eye in the morning and at bedtime. (Patient not taking: Reported on 09/30/2019) 2.5 mL 1  . valACYclovir (VALTREX) 1000 MG tablet Take 1 tablet (1,000 mg total) by mouth 3 (three) times daily. (Patient not taking: Reported on 09/30/2019) 21 tablet 1   No current facility-administered medications for this visit.     PHYSICAL EXAMINATION: ECOG PERFORMANCE STATUS: 1 - Symptomatic but completely ambulatory Vitals:   09/30/19 1322  BP: 138/82  Pulse: (!) 116  Resp: 18  Temp: 97.7 F (36.5 C)  SpO2: 96%   Filed Weights   09/30/19 1322  Weight: 227 lb 4.8 oz (103.1 kg)    Physical Exam Constitutional:      General: She is not in acute distress. HENT:     Head: Normocephalic and atraumatic.  Eyes:     General: No scleral icterus.    Pupils: Pupils are equal, round, and reactive to light.  Cardiovascular:     Rate and Rhythm: Normal rate and regular rhythm.     Heart sounds: Normal heart sounds.  Pulmonary:     Effort: Pulmonary effort is normal. No respiratory distress.     Breath sounds: No wheezing.     Comments: Decreased breath sounds bilaterally Abdominal:     General: Bowel sounds are normal. There is no distension.     Palpations: Abdomen is soft. There is no mass.     Tenderness: There is no abdominal tenderness.  Musculoskeletal:        General: Swelling present. No deformity. Normal range of motion.     Cervical back: Normal range of motion and neck supple.     Comments: Bilateral lower extremity 3+ edema. Upper inner thigh erythematous skin changes covered by nystatin cream  Skin:    General: Skin is warm and dry.     Findings: No erythema or rash.  Neurological:     General: No focal deficit present.  Mental Status: She is alert and oriented to person, place, and time.     Cranial  Nerves: No cranial nerve deficit.     Coordination: Coordination normal.  Psychiatric:        Mood and Affect: Mood normal.      LABORATORY DATA:  I have reviewed the data as listed Lab Results  Component Value Date   WBC 9.4 09/22/2019   HGB 11.4 (L) 09/22/2019   HCT 33.4 (L) 09/22/2019   MCV 90.0 09/22/2019   PLT 250 09/22/2019   Recent Labs    08/09/19 1003 08/09/19 1003 09/08/19 1644 09/14/19 1134 09/22/19 1112  NA 136   < > 132* 130* 135  K 3.4*   < > 4.3 4.3 4.0  CL 101   < > 97* 96* 102  CO2 26   < > '25 24 25  '$ GLUCOSE 94   < > 163* 100* 147*  BUN 18   < > 28* 23 20  CREATININE 0.88   < > 0.93 0.96 0.78  CALCIUM 8.5*   < > 8.9 8.7* 8.6*  GFRNONAA >60   < > >60 >60 >60  GFRAA >60   < > >60 >60 >60  PROT 7.0  --  6.7  --  6.3*  ALBUMIN 3.6  --  3.8  --  3.4*  AST 18  --  35  --  25  ALT 23  --  51*  --  38  ALKPHOS 72  --  61  --  60  BILITOT 0.6  --  0.7  --  0.4   < > = values in this interval not displayed.   Iron/TIBC/Ferritin/ %Sat No results found for: IRON, TIBC, FERRITIN, IRONPCTSAT   RADIOGRAPHIC STUDIES: I have personally reviewed the radiological images as listed and agreed with the findings in the report. CT HEAD WO CONTRAST  Result Date: 08/03/2019 CLINICAL DATA:  Transit ischemic attack. EXAM: CT HEAD WITHOUT CONTRAST TECHNIQUE: Contiguous axial images were obtained from the base of the skull through the vertex without intravenous contrast. COMPARISON:  03/10/2018 MRI head. FINDINGS: Brain: There is a peripherally hyperdense 2.0 x 1.9 x 2.5 cm right frontal lesion (2:18, 4:25), new since prior exam. New right frontal/perilesional vasogenic edema likely involving the right basal ganglia, posterior limb of the right internal capsule and thalamus. Partial effacement of the right lateral ventricle with 4 mm leftward midline shift (2:17). No ventriculomegaly. No extra-axial fluid collection. Vascular: No hyperdense vessel. Bilateral carotid siphon  atherosclerotic calcifications. Skull: Negative for fracture or focal lesion. Sinuses/Orbits: Normal orbits. Clear paranasal sinuses. No mastoid effusion. Other: None. IMPRESSION: Peripherally hyperdense 2.5 cm right frontal lesion is concerning for hemorrhagic metastases versus primary neoplasm. Further evaluation with MRI brain with and without contrast is recommended. Right frontal perilesional edema involving the right basal ganglia and thalamus. Partial right lateral ventricle effacement with 4 mm leftward midline shift. These results were called by telephone at the time of interpretation on 08/03/2019 at 1:22 pm to provider Blake Divine , who verbally acknowledged these results. Electronically Signed   By: Primitivo Gauze M.D.   On: 08/03/2019 13:26   CT CHEST WO CONTRAST  Result Date: 07/20/2019 CLINICAL DATA:  Status post SBRT or left lower lobe lung cancer. EXAM: CT CHEST WITHOUT CONTRAST TECHNIQUE: Multidetector CT imaging of the chest was performed following the standard protocol without IV contrast. COMPARISON:  04/20/2019 FINDINGS: Cardiovascular: The heart size appears within normal limits. Aortic atherosclerosis. No pericardial effusion.  Left circumflex coronary artery calcification. Mediastinum/Nodes: Normal appearance of the thyroid gland. The trachea appears patent and is midline. Small hiatal hernia. No enlarged mediastinal or hilar adenopathy. Lungs/Pleura: Paraseptal and centrilobular emphysema. There is a bandlike area fibrosis and architectural distortion involving the lingula and superior segment of left lower lobe, image 93/3. Findings reflect changes secondary to external beam radiation. Underlying lung lesion within the left lower lobe is no longer measurable separate from these changes. No new lung lesion. Upper Abdomen: No acute abnormality. Musculoskeletal: No chest wall mass or suspicious bone lesions identified. IMPRESSION: 1. Continued evolutionary changes secondary to  external beam radiation postradiation involving the lingula and superior segment of left lower lobe. No specific findings identified to suggest residual or recurrence of tumor. 2. Emphysema and aortic atherosclerosis. 3. Left circumflex coronary artery calcification. Aortic Atherosclerosis (ICD10-I70.0) and Emphysema (ICD10-J43.9). Electronically Signed   By: Kerby Moors M.D.   On: 07/20/2019 15:37   CT CHEST W CONTRAST  Result Date: 08/09/2019 CLINICAL DATA:  Metastatic lung cancer. EXAM: CT CHEST, ABDOMEN, AND PELVIS WITH CONTRAST TECHNIQUE: Multidetector CT imaging of the chest, abdomen and pelvis was performed following the standard protocol during bolus administration of intravenous contrast. CONTRAST:  143m OMNIPAQUE IOHEXOL 300 MG/ML  SOLN COMPARISON:  CT chest 07/20/2019. PET-CT 12/29/2018. FINDINGS: CT CHEST FINDINGS Cardiovascular: The heart size is normal. No substantial pericardial effusion. Atherosclerotic calcification is noted in the wall of the thoracic aorta. Right Port-A-Cath tip is positioned in the upper right atrium. Mediastinum/Nodes: No mediastinal lymphadenopathy. 12 mm short axis left hilar node visible on 31/2. Probably present on prior study although difficult to assess given lack of intravenous contrast on the earlier exams. No right hilar lymphadenopathy. Tiny hiatal hernia. The esophagus has normal imaging features. There is no axillary lymphadenopathy. Lungs/Pleura: Bandlike interstitial and airspace opacity in the lateral left upper and lower lobes is similar to prior although the collapse/consolidative opacity in the lingula has decreased slightly in the interval. Imaging features likely reflect sequelae of radiation therapy. No suspicious pulmonary nodule or mass. No pleural effusion. Musculoskeletal: No worrisome lytic or sclerotic osseous abnormality. CT ABDOMEN PELVIS FINDINGS Hepatobiliary: No suspicious focal abnormality within the liver parenchyma. There is no evidence  for gallstones, gallbladder wall thickening, or pericholecystic fluid. No intrahepatic or extrahepatic biliary dilation. Pancreas: No focal mass lesion. No dilatation of the main duct. No intraparenchymal cyst. No peripancreatic edema. Spleen: No splenomegaly. No focal mass lesion. Adrenals/Urinary Tract: No adrenal nodule or mass. Kidneys unremarkable. No evidence for hydroureter. The urinary bladder appears normal for the degree of distention. Stomach/Bowel: Tiny hiatal hernia. Stomach is unremarkable. No gastric wall thickening. No evidence of outlet obstruction. Duodenum is normally positioned as is the ligament of Treitz. Duodenal diverticulum evident. No small bowel wall thickening. No small bowel dilatation. The terminal ileum is normal. The appendix is normal. No gross colonic mass. No colonic wall thickening. Diverticular changes are noted in the left colon without evidence of diverticulitis. Vascular/Lymphatic: There is abdominal aortic atherosclerosis without aneurysm. There is no gastrohepatic or hepatoduodenal ligament lymphadenopathy. No retroperitoneal or mesenteric lymphadenopathy. No pelvic sidewall lymphadenopathy. Reproductive: The uterus is unremarkable. 2.5 cm cystic lesion left ovary is stable since PET-CT 12/29/2018. Other: No intraperitoneal free fluid. Musculoskeletal: No worrisome lytic or sclerotic osseous abnormality. IMPRESSION: 1. Chest CT stable since 07/20/2019 without new or progressive findings. 2. 12 mm short axis left hilar lymph node is probably stable since prior study although difficult to assess given lack of intravenous contrast  on the earlier exams. Close attention on follow-up recommended. 3. No findings to suggest metastatic disease in the abdomen/pelvis. 4. Left colonic diverticulosis without diverticulitis. 5. Stable 2.5 cm cystic lesion left ovary, not hypermetabolic on previous PET imaging and likely benign. 6. Aortic Atherosclerosis (ICD10-I70.0). Electronically  Signed   By: Misty Stanley M.D.   On: 08/09/2019 16:54   MR Brain W Wo Contrast  Result Date: 09/29/2019 CLINICAL DATA:  Brain metastasis. Intractable headache, unspecified chronicity pattern, unspecified headache type. Additional history provided: Headache/possible hallucinations/history of brain mets. Status post brain radiation, patient reports severe headache for several days. EXAM: MRI HEAD WITHOUT AND WITH CONTRAST TECHNIQUE: Multiplanar, multiecho pulse sequences of the brain and surrounding structures were obtained without and with intravenous contrast. CONTRAST:  40m GADAVIST GADOBUTROL 1 MMOL/ML IV SOLN COMPARISON:  Prior brain MRI examinations 08/31/2019 and earlier FINDINGS: Brain: There has been an interval increase in enhancement at site of a solitary metastasis centered within the right frontal operculum, now encompassing a region of 3.4 cm (previously 2.8 cm). As before, portions of the mass are inseparable from the overlying dura and there is adjacent mild dural thickening and enhancement. Surrounding edema has notably increased. There is increased mass effect with increased partial effacement of the right lateral ventricle and now 6 mm leftward midline shift measured at the level of the septum pellucidum. Redemonstrated small foci of SWI signal loss along the anterolateral aspect of the mass, which may reflect small foci of non-acute hemorrhage. No new intracranial enhancing metastasis is demonstrated. Stable nonspecific symmetric enhancement within the IAC fundus bilaterally. Additional patchy T2/FLAIR hyperintensity within the cerebral white matter is nonspecific, but consistent with chronic small vessel ischemic disease. There is no acute infarct. No extra-axial fluid collection. Vascular: Expected proximal arterial flow voids. Skull and upper cervical spine: No focal marrow lesion. Sinuses/Orbits: Visualized orbits show no acute finding. Mild ethmoid sinus mucosal thickening. No  significant mastoid effusion. IMPRESSION: Interval increase in enhancement at site of a solitary right frontal operculum metastasis, now encompassing a region of 3.4 cm (previously 2.8 cm). As before, the mass is inseparable from the overlying dura, although the lesion is favored intra-axial. Surrounding edema has also notably increased. This increased enhancement and edema may reflect inflammatory changes from recent radiation therapy. Attention recommended on follow-up. Increased mass effect with increased partial effacement of the right lateral ventricle and now 6 mm leftward midline shift. No new intracranial metastases are identified. Electronically Signed   By: KKellie SimmeringDO   On: 09/29/2019 13:56   MR Brain W Wo Contrast  Result Date: 08/31/2019 CLINICAL DATA:  64year old female with lung cancer and solitary right frontal metastasis by MRI last month. SRS treatment planning. EXAM: MRI HEAD WITHOUT AND WITH CONTRAST TECHNIQUE: Multiplanar, multiecho pulse sequences of the brain and surrounding structures were obtained without and with intravenous contrast. CONTRAST:  976mGADAVIST GADOBUTROL 1 MMOL/ML IV SOLN COMPARISON:  Brain MRI 08/03/2019. brain MRI 03/10/2018. FINDINGS: BRAIN Lobulated and enhancing peripherally located mass at the frontal operculum now measures up to 28 mm diameter and is inseparable from the overlying dura with mild adjacent dural thickening as before. However, this does appear to be intra-axial, with no definite cleft visible on T2 imaging. Regional T2 and FLAIR hyperintensity in keeping with vasogenic edema has mildly regressed. No midline shift. Mildly improved patency of the right lateral ventricle since last month. No other enhancing brain lesion or cerebral edema. No other dural thickening. Anterior left frontal lobe developmental venous  anomaly (normal variant). Other Brain findings: No restricted diffusion to suggest acute infarction. No ventriculomegaly, extra-axial  collection or acute intracranial hemorrhage. Cervicomedullary junction and pituitary are within normal limits. Axial images raise the possibility of bilateral IAC fundus enhancement (such as series 14, image 59 on the right), but this is stable since 2019 and also not definitely correlated on the coronal or sagittal postcontrast images. Vascular: Major intracranial vascular flow voids are stable. The major dural venous sinuses are enhancing and appear to be patent. Skull and upper cervical spine: Negative visible cervical spine and spinal cord. Bone marrow signal is stable and within normal limits. Sinuses/Orbits: Negative orbits. Paranasal Visualized paranasal sinuses and mastoids are stable and well pneumatized. Other: Scalp and face soft tissues appear negative. IMPRESSION: 1. Solitary enhancing mass at the right frontal operculum now measures up to 28 mm diameter. As before this is inseparable from the overlying dura which appears mildly thickened, although I favor this is an intra-axial rather than extra-axial metastasis. 2. Mild regression of vasogenic edema and mass effect since last month. No midline shift. 3. No new metastatic disease identified. Electronically Signed   By: Odessa Fleming M.D.   On: 08/31/2019 12:50   MR Brain W and Wo Contrast  Result Date: 08/03/2019 CLINICAL DATA:  Abnormal CT, history of lung cancer EXAM: MRI HEAD WITHOUT AND WITH CONTRAST TECHNIQUE: Multiplanar, multiecho pulse sequences of the brain and surrounding structures were obtained without and with intravenous contrast. CONTRAST:  52mL GADAVIST GADOBUTROL 1 MMOL/ML IV SOLN COMPARISON:  MRI 2019, CT earlier same day FINDINGS: Brain: Corresponding to abnormality on CT, there is an enhancing lesion of the peripheral right frontal lobe involving or abutting the lateral precentral gyrus. This measures approximately 2.1 x 2.1 x 2.5 cm. There is significant surrounding edema resulting in regional sulcal effacement, partial effacement of  the right lateral ventricle, and mild leftward midline shift measuring 4 mm. No associated reduced diffusion. There is mild susceptibility hypointensity, which may reflect intralesional blood products. There is no additional mass or abnormal enhancement. No hydrocephalus. Additional patchy T2 hyperintensity in the supratentorial white matter is nonspecific but may reflect mild chronic microvascular ischemic changes. No acute infarction. Vascular: Major vessel flow voids at the skull base are preserved. Skull and upper cervical spine: Normal marrow signal is preserved. Sinuses/Orbits: Trace paranasal sinus mucosal thickening. Orbits are unremarkable. Other: Sella is unremarkable. IMPRESSION: Right frontal mass likely reflecting a metastasis. Significant surrounding edema with mild regional mass effect including subcentimeter leftward midline shift. No hydrocephalus. Electronically Signed   By: Guadlupe Spanish M.D.   On: 08/03/2019 18:28   CT ABDOMEN PELVIS W CONTRAST  Result Date: 08/09/2019 CLINICAL DATA:  Metastatic lung cancer. EXAM: CT CHEST, ABDOMEN, AND PELVIS WITH CONTRAST TECHNIQUE: Multidetector CT imaging of the chest, abdomen and pelvis was performed following the standard protocol during bolus administration of intravenous contrast. CONTRAST:  OMNIPAQUE IOHEXOL 300 MG/ML  SOLN COMPARISON:  CT chest 07/20/2019. PET-CT 12/29/2018. FINDINGS: CT CHEST FINDINGS Cardiovascular: The heart size is normal. No substantial pericardial effusion. Atherosclerotic calcification is noted in the wall of the thoracic aorta. Right Port-A-Cath tip is positioned in the upper right atrium. Mediastinum/Nodes: No mediastinal lymphadenopathy. 12 mm short axis left hilar node visible on 31/2. Probably present on prior study although difficult to assess given lack of intravenous contrast on the earlier exams. No right hilar lymphadenopathy. Tiny hiatal hernia. The esophagus has normal imaging features. There is no axillary  lymphadenopathy. Lungs/Pleura: Bandlike interstitial and  airspace opacity in the lateral left upper and lower lobes is similar to prior although the collapse/consolidative opacity in the lingula has decreased slightly in the interval. Imaging features likely reflect sequelae of radiation therapy. No suspicious pulmonary nodule or mass. No pleural effusion. Musculoskeletal: No worrisome lytic or sclerotic osseous abnormality. CT ABDOMEN PELVIS FINDINGS Hepatobiliary: No suspicious focal abnormality within the liver parenchyma. There is no evidence for gallstones, gallbladder wall thickening, or pericholecystic fluid. No intrahepatic or extrahepatic biliary dilation. Pancreas: No focal mass lesion. No dilatation of the main duct. No intraparenchymal cyst. No peripancreatic edema. Spleen: No splenomegaly. No focal mass lesion. Adrenals/Urinary Tract: No adrenal nodule or mass. Kidneys unremarkable. No evidence for hydroureter. The urinary bladder appears normal for the degree of distention. Stomach/Bowel: Tiny hiatal hernia. Stomach is unremarkable. No gastric wall thickening. No evidence of outlet obstruction. Duodenum is normally positioned as is the ligament of Treitz. Duodenal diverticulum evident. No small bowel wall thickening. No small bowel dilatation. The terminal ileum is normal. The appendix is normal. No gross colonic mass. No colonic wall thickening. Diverticular changes are noted in the left colon without evidence of diverticulitis. Vascular/Lymphatic: There is abdominal aortic atherosclerosis without aneurysm. There is no gastrohepatic or hepatoduodenal ligament lymphadenopathy. No retroperitoneal or mesenteric lymphadenopathy. No pelvic sidewall lymphadenopathy. Reproductive: The uterus is unremarkable. 2.5 cm cystic lesion left ovary is stable since PET-CT 12/29/2018. Other: No intraperitoneal free fluid. Musculoskeletal: No worrisome lytic or sclerotic osseous abnormality. IMPRESSION: 1. Chest CT  stable since 07/20/2019 without new or progressive findings. 2. 12 mm short axis left hilar lymph node is probably stable since prior study although difficult to assess given lack of intravenous contrast on the earlier exams. Close attention on follow-up recommended. 3. No findings to suggest metastatic disease in the abdomen/pelvis. 4. Left colonic diverticulosis without diverticulitis. 5. Stable 2.5 cm cystic lesion left ovary, not hypermetabolic on previous PET imaging and likely benign. 6. Aortic Atherosclerosis (ICD10-I70.0). Electronically Signed   By: Misty [facility] M.D.   On: 08/09/2019 16:54    ASSESSMENT & PLAN:  1. Metastatic adenocarcinoma to lung with unknown primary site, unspecified laterality ([place])   2. Brain metastasis ([place])   3. Port-A-Cath in place   4. Herpes zoster without complication   Cancer Staging Malignant neoplasm of lung [facility]) Staging form: Lung, AJCC 8th Edition - Clinical stage from 03/12/2018: Stage IV (cT1, cN3, pM1) - Signed by Earlie Server, MD on 03/12/2018  # EGFR mutation [[id number]] positive Stage IV lung adenocarcinoma.  On osimertinib 80 mg daily,  She developed frontal lobe CNS metastasis while on TKI.  Status post SRS Recent MRI brain was independently reviewed by me and discussed with patient. Increased right frontal lobe mass with increased edema. This explains her increased neurologic symptoms including possible hallucinations, personality changes, and headache. I recommend patient to restart dexamethasone 4 mg twice daily.  We will further discuss with radiation oncology to see if any additional radiation can be done. I reviewed her Keppra 500 mg twice daily.  Refill sent to pharmacy. Advised patient about no driving.  #Bilateral lower extremity edema, echocardiogram in 07/27/2018 showed normal ejection fraction. Patient reports a history of bilateral lower extremity vein insufficiency, previously followed by Dr. Lucky Cowboy She has Lasix 20 mg daily listed on her  medication list however patient has not been taking it and not aware that she needs to take it. I recommend to obtain bilateral lower extremity ultrasound, rule out DVT. Recommend patient to resume Lasix 20 mg  daily.  Monitor potassium level.  #Goals of care was discussed with patient.  Discussed with her that her disease is incurable. Prognosis is poor with CNS metastasis. Recommend patient to continue follow-up with palliative care service. Recommend home care nurse from medicine management.  Follow-up in 1 week for further discussion.   We spent sufficient time to discuss many aspect of care, questions were answered to patient's satisfaction. The patient knows to call the clinic with any problems questions or concerns.   Earlie Server, MD, PhD Hematology Oncology [facility] Pager- [phone] 09/30/2019

## 2019-10-01 DIAGNOSIS — Z978 Presence of other specified devices: Secondary | ICD-10-CM | POA: Insufficient documentation

## 2019-10-01 DIAGNOSIS — B029 Zoster without complications: Secondary | ICD-10-CM | POA: Insufficient documentation

## 2019-10-03 ENCOUNTER — Ambulatory Visit: Payer: 59 | Admitting: Oncology

## 2019-10-03 ENCOUNTER — Telehealth: Payer: Self-pay | Admitting: *Deleted

## 2019-10-03 ENCOUNTER — Other Ambulatory Visit: Payer: Self-pay | Admitting: Oncology

## 2019-10-03 ENCOUNTER — Telehealth: Payer: Self-pay

## 2019-10-03 ENCOUNTER — Other Ambulatory Visit: Payer: 59

## 2019-10-03 MED ORDER — OXYCODONE HCL 5 MG PO TABS: 5.0000 mg | ORAL_TABLET | Freq: Four times a day (QID) | ORAL | 0 refills | Status: DC | PRN

## 2019-10-03 NOTE — Telephone Encounter (Signed)
Called pt to make her aware of her New scheduled appts location, dates and time w/Van transportation provided, she stated that she already had transportation for all her sched appts and requested to have Lucianne Lei pick up appts cx. Pt is aware of all her upcoming appts.

## 2019-10-03 NOTE — Telephone Encounter (Signed)
Per [place] message from [place]: "Patient called this morning complaining of severe headaches over the weekend and pressure between her eyes when standing.   I offered to try and have her seen by [facility] or Dr. Tasia Catchings but she said she just saw Dr. Tasia Catchings on Friday and transportation is an issue as well. Unfortunately Dr. Baruch Gouty won't be back until Wednesday.   She is thinking she may need something stronger for pain for her headaches, she is taking tylenol."   I contacted patient and made sure she is taking Dex 4mg  BID, pt reports that she is taking all her medications as prescribed. She states that Dex eases the pain a little but when she stands up the pain is so bad that she feels like she is going to pass out. Notified pt that Dr. Tasia Catchings has sent 30 days supply of oxycodone to her pharmacy.   Per Wilhemena Durie "I am going to have him look at the MRI first thing Wednesday and see what he would like to do, either bring her in for follow up, or if he can give more radiation possibly simulation."

## 2019-10-04 ENCOUNTER — Other Ambulatory Visit: Payer: Self-pay

## 2019-10-04 ENCOUNTER — Ambulatory Visit
Admission: RE | Admit: 2019-10-04 | Discharge: 2019-10-04 | Disposition: A | Discharge status: Home / Self Care | Payer: 59 | Source: Ambulatory Visit | Attending: Oncology | Admitting: Oncology

## 2019-10-04 ENCOUNTER — Inpatient Hospital Stay (HOSPITAL_BASED_OUTPATIENT_CLINIC_OR_DEPARTMENT_OTHER): Payer: 59 | Admitting: Hospice and Palliative Medicine

## 2019-10-04 ENCOUNTER — Ambulatory Visit: Payer: 59 | Admitting: Hematology and Oncology

## 2019-10-04 VITALS — BP 127/72 | HR 93 | Temp 97.3°F | Resp 18

## 2019-10-04 DIAGNOSIS — C801 Malignant (primary) neoplasm, unspecified: Secondary | ICD-10-CM | POA: Insufficient documentation

## 2019-10-04 DIAGNOSIS — Z515 Encounter for palliative care: Secondary | ICD-10-CM | POA: Diagnosis not present

## 2019-10-04 DIAGNOSIS — C7800 Secondary malignant neoplasm of unspecified lung: Secondary | ICD-10-CM

## 2019-10-04 DIAGNOSIS — C3490 Malignant neoplasm of unspecified part of unspecified bronchus or lung: Secondary | ICD-10-CM | POA: Diagnosis not present

## 2019-10-04 NOTE — Progress Notes (Signed)
[place]  Telephone:([phone] [phone]   Name: Kristina Wright Date: 10/04/2019 MRN: [id number]  DOB: 03-Aug-1955  Patient Care Team: Juline Patch, MD as PCP - General (Family Medicine) Lequita Asal, MD as Referring Physician (Hematology and Oncology) Delana Meyer, Dolores Lory, MD (Vascular Surgery) Noreene Filbert, MD as Radiation Oncologist (Radiation Oncology)    REASON FOR CONSULTATION: Kristina Wright is a 64 y.o. female with multiple medical problems including stage IV adenocarcinoma metastatic to brain, former smoker, COPD, anxiety/depression.  Patient is status post XRT to the lung and brain.  Patient is currently on treatment with osimertinib.  She has had neurologic symptoms with recent MRI showing CNS progression.  Patient was referred to palliative care to help address goals and manage ongoing symptoms.  SOCIAL HISTORY:     reports that she quit smoking about 22 months ago. Her smoking use included cigarettes. She has a 25.00 pack-year smoking history. She has never used smokeless tobacco. She reports current drug use. Drug: Marijuana. She reports that she does not drink alcohol.   Patient is divorced.  She lives at home alone but has her son staying with her this summer as she is home from college.  She also has another son and daughter in [place].  Patient previously worked as a Programme researcher, broadcasting/film/video at [employer].  ADVANCE DIRECTIVES:  Does not have  CODE STATUS:   PAST MEDICAL HISTORY: Past Medical History:  Diagnosis Date  . Cancer ([place]) 1989   cervical  . Depression   . Family history of breast cancer   . Family history of colon cancer   . Family history of ovarian cancer   . Family history of stomach cancer   . Family history of throat cancer   . Hyperlipidemia   . Hypertension   . Malignant neoplasm of lung ([place]) 03/01/2018  . Thyroid disease     PAST SURGICAL HISTORY:  Past Surgical  History:  Procedure Laterality Date  . BILATERAL CARPAL TUNNEL RELEASE    . BREAST CYST ASPIRATION Left   . LUNG BIOPSY    . PARTIAL HYSTERECTOMY    . PORTA CATH INSERTION N/A 03/03/2018   Procedure: PORTA CATH INSERTION;  Surgeon: Katha Cabal, MD;  Location: [facility];  Service: Cardiovascular;  Laterality: N/A;  . TUBAL LIGATION      HEMATOLOGY/ONCOLOGY HISTORY:  Oncology History  Malignant neoplasm of lung ([place])  03/01/2018 Initial Diagnosis   Malignant neoplasm of lung ([place])   03/12/2018 Cancer Staging   Staging form: Lung, AJCC 8th Edition - Clinical stage from 03/12/2018: Stage IV (cT1, cN3, pM1) - Signed by Earlie Server, MD on 03/12/2018   03/15/2018 - 03/15/2018 Chemotherapy   The patient had palonosetron (ALOXI) injection 0.25 mg, 0.25 mg, Intravenous,  Once, 0 of 4 cycles PEMEtrexed (ALIMTA) 1,000 mg in sodium chloride 0.9 % 100 mL chemo infusion, 500 mg/m2, Intravenous,  Once, 0 of 6 cycles CARBOplatin (PARAPLATIN) in sodium chloride 0.9 % 100 mL chemo infusion, , Intravenous,  Once, 0 of 4 cycles pembrolizumab (KEYTRUDA) 200 mg in sodium chloride 0.9 % 50 mL chemo infusion, 200 mg, Intravenous, Once, 0 of 6 cycles fosaprepitant (EMEND) 150 mg, dexamethasone (DECADRON) 12 mg in sodium chloride 0.9 % 145 mL IVPB, , Intravenous,  Once, 0 of 4 cycles  for chemotherapy treatment.      ALLERGIES:  has No Known Allergies.  MEDICATIONS:  Current Outpatient Medications  Medication Sig  Dispense Refill  . ALPRAZolam (XANAX) 0.25 MG tablet Take 1 tablet (0.25 mg total) by mouth at bedtime as needed. for anxiety AS NEEDED ONLY! 30 tablet 0  . aspirin EC 81 MG tablet Take 81 mg by mouth daily.    Marland Kitchen atorvastatin (LIPITOR) 10 MG tablet Take 1 tablet (10 mg total) by mouth daily. 90 tablet 1  . busPIRone (BUSPAR) 5 MG tablet Take 1 tablet (5 mg total) by mouth daily. 90 tablet 1  . chlorhexidine (PERIDEX) 0.12 % solution Place 15-61m of solution in warm water and  soak fingers QID. 473 mL 0  . clopidogrel (PLAVIX) 75 MG tablet Take 1 tablet (75 mg total) by mouth daily. 90 tablet 0  . dexamethasone (DECADRON) 4 MG tablet Take 1 tablet (4 mg total) by mouth 2 (two) times daily. 28 tablet 1  . FLUoxetine (PROZAC) 10 MG capsule Take 1 capsule (10 mg total) by mouth daily. 90 capsule 1  . furosemide (LASIX) 20 MG tablet Take 1 tablet (20 mg total) by mouth daily. 30 tablet 1  . hydrOXYzine (ATARAX/VISTARIL) 10 MG tablet Take 1 tablet (10 mg total) by mouth daily. 30 tablet 5  . levETIRAcetam (KEPPRA) 500 MG tablet Take 1 tablet (500 mg total) by mouth 2 (two) times daily. 60 tablet 2  . levothyroxine (SYNTHROID) 112 MCG tablet Take 1 tablet (112 mcg total) by mouth daily. 90 tablet 1  . lidocaine (XYLOCAINE) 2 % solution Use as directed 15 mLs in the mouth or throat as needed for mouth pain. 100 mL 0  . lisinopril-hydrochlorothiazide (ZESTORETIC) 20-25 MG tablet Take 1 tablet by mouth daily. 90 tablet 0  . Multiple Vitamins-Minerals (MULTIVITAMIN ADULT PO) Take 1 tablet by mouth daily.     . mupirocin ointment (BACTROBAN) 2 % Apply 1 application topically 2 (two) times daily. 22 g 0  . nystatin cream (MYCOSTATIN) Apply 1 application topically 2 (two) times daily. 30 g 0  . Olopatadine HCl 0.2 % SOLN Apply 1 drop to eye in the morning and at bedtime. 2.5 mL 1  . osimertinib mesylate (TAGRISSO) 80 MG tablet Take 1 tablet (80 mg total) by mouth daily. 30 tablet 2  . oxyCODONE (OXY IR/ROXICODONE) 5 MG immediate release tablet Take 1 tablet (5 mg total) by mouth every 6 (six) hours as needed for severe pain. 30 tablet 0  . umeclidinium-vilanterol (ANORO ELLIPTA) 62.5-25 MCG/INH AEPB Inhale 1 puff into the lungs daily. 60 each 5  . valACYclovir (VALTREX) 1000 MG tablet Take 1 tablet (1,000 mg total) by mouth 3 (three) times daily. 21 tablet 1  . VENTOLIN HFA 108 (90 Base) MCG/ACT inhaler Inhale 1-2 puffs into the lungs every 6 (six) hours as needed for wheezing or  shortness of breath. 18 g 1   No current facility-administered medications for this visit.    VITAL SIGNS: BP 127/72 (BP Location: Left Arm, Patient Position: Sitting)   Pulse 93   Temp (!) 97.3 F (36.3 C) (Tympanic)   Resp 18   SpO2 98%  Filed Weights    Estimated body mass index is 33.57 kg/m as calculated from the following:   Height as of 09/26/19: 5' 9"  (1.753 m).   Weight as of 09/30/19: 227 lb 4.8 oz (103.1 kg).  LABS: CBC:    Component Value Date/Time   WBC 9.4 09/22/2019 1112   HGB 11.4 (L) 09/22/2019 1112   HGB 13.4 08/02/2019 1015   HCT 33.4 (L) 09/22/2019 1112   HCT 38.9 08/02/2019  1015   PLT 250 09/22/2019 1112   PLT 278 08/02/2019 1015   MCV 90.0 09/22/2019 1112   MCV 90 08/02/2019 1015   NEUTROABS 7.5 09/22/2019 1112   NEUTROABS 3.3 08/02/2019 1015   LYMPHSABS 1.4 09/22/2019 1112   LYMPHSABS 2.0 08/02/2019 1015   MONOABS 0.4 09/22/2019 1112   EOSABS 0.0 09/22/2019 1112   EOSABS 0.1 08/02/2019 1015   BASOSABS 0.0 09/22/2019 1112   BASOSABS 0.0 08/02/2019 1015   Comprehensive Metabolic Panel:    Component Value Date/Time   NA 135 09/22/2019 1112   NA 140 08/02/2019 1015   K 4.0 09/22/2019 1112   CL 102 09/22/2019 1112   CO2 25 09/22/2019 1112   BUN 20 09/22/2019 1112   BUN 13 08/02/2019 1015   CREATININE 0.78 09/22/2019 1112   GLUCOSE 147 (H) 09/22/2019 1112   CALCIUM 8.6 (L) 09/22/2019 1112   AST 25 09/22/2019 1112   ALT 38 09/22/2019 1112   ALKPHOS 60 09/22/2019 1112   BILITOT 0.4 09/22/2019 1112   BILITOT 0.3 08/02/2019 1015   PROT 6.3 (L) 09/22/2019 1112   PROT 6.9 08/02/2019 1015   ALBUMIN 3.4 (L) 09/22/2019 1112   ALBUMIN 4.1 08/02/2019 1015    RADIOGRAPHIC STUDIES: MR Brain W Wo Contrast  Result Date: 09/29/2019 CLINICAL DATA:  Brain metastasis. Intractable headache, unspecified chronicity pattern, unspecified headache type. Additional history provided: Headache/possible hallucinations/history of brain mets. Status post brain  radiation, patient reports severe headache for several days. EXAM: MRI HEAD WITHOUT AND WITH CONTRAST TECHNIQUE: Multiplanar, multiecho pulse sequences of the brain and surrounding structures were obtained without and with intravenous contrast. CONTRAST:  69m GADAVIST GADOBUTROL 1 MMOL/ML IV SOLN COMPARISON:  Prior brain MRI examinations 08/31/2019 and earlier FINDINGS: Brain: There has been an interval increase in enhancement at site of a solitary metastasis centered within the right frontal operculum, now encompassing a region of 3.4 cm (previously 2.8 cm). As before, portions of the mass are inseparable from the overlying dura and there is adjacent mild dural thickening and enhancement. Surrounding edema has notably increased. There is increased mass effect with increased partial effacement of the right lateral ventricle and now 6 mm leftward midline shift measured at the level of the septum pellucidum. Redemonstrated small foci of SWI signal loss along the anterolateral aspect of the mass, which may reflect small foci of non-acute hemorrhage. No new intracranial enhancing metastasis is demonstrated. Stable nonspecific symmetric enhancement within the IAC fundus bilaterally. Additional patchy T2/FLAIR hyperintensity within the cerebral white matter is nonspecific, but consistent with chronic small vessel ischemic disease. There is no acute infarct. No extra-axial fluid collection. Vascular: Expected proximal arterial flow voids. Skull and upper cervical spine: No focal marrow lesion. Sinuses/Orbits: Visualized orbits show no acute finding. Mild ethmoid sinus mucosal thickening. No significant mastoid effusion. IMPRESSION: Interval increase in enhancement at site of a solitary right frontal operculum metastasis, now encompassing a region of 3.4 cm (previously 2.8 cm). As before, the mass is inseparable from the overlying dura, although the lesion is favored intra-axial. Surrounding edema has also notably  increased. This increased enhancement and edema may reflect inflammatory changes from recent radiation therapy. Attention recommended on follow-up. Increased mass effect with increased partial effacement of the right lateral ventricle and now 6 mm leftward midline shift. No new intracranial metastases are identified. Electronically Signed   By: KKellie SimmeringDO   On: 09/29/2019 13:56    PERFORMANCE STATUS (ECOG) : 2 - Symptomatic, <50% confined to bed  Review of Systems Unless otherwise noted, a complete review of systems is negative.  Physical Exam General: NAD, in wheelchair Pulmonary: Unlabored Extremities: Bilateral lower extremity edema, no joint deformities Skin: Herpetic rash to thighs Neurological: Weakness but otherwise nonfocal  IMPRESSION: Met with patient today to introduce palliative care services.  Patient says that she is feeling better this week.  She says that she is feeling "rough" last week when she saw Dr. Tasia Catchings.  She has had a variety of recent issues including shingles, bilateral lower extremity edema, and neurologic changes such as visual hallucinations and "blackout spells".  MRI brain on 09/29/2019 revealed interval increase in right frontal mass.  She has been referred back to radiation oncology.  Patient has some weakness and is pending home health.  We will also order home-based palliative care.  Patient seems to have a reasonable understanding of the current work-up, treatment plan, and prognosis.  She says that her pain is improved with as needed oxycodone.  She did have concerns about possible addiction but we talked through these concerns.  We discussed ACP documents and I reviewed with her a MOST form, which she took home to discuss with her family.  She says that she wants to appoint her daughter to be her [facility] POA.  Patient verbalized financial strain associated with her treatments.  Will refer to [facility], SW.  PLAN: -Continue current scope of  treatment -Continue oxycodone 5 mg every 6 hours as needed for pain -Prophylactic bowel regimen -Patient is pending initiation of home health -Referral to home-based palliative care to help with care coordination -Referral to [facility], SW due to financial strain -ACP/MOST form reviewed -Follow-up MyChart visit in about a month  Patient expressed understanding and was in agreement with this plan. She also understands that She can call the clinic at any time with any questions, concerns, or complaints.     Time Total: 15 minutes  Visit consisted of counseling and education dealing with the complex and emotionally intense issues of symptom management and palliative care in the setting of serious and potentially life-threatening illness.Greater than 50%  of this time was spent counseling and coordinating care related to the above assessment and plan.  Signed by: Altha Harm, PhD, NP-C

## 2019-10-05 ENCOUNTER — Telehealth: Payer: Self-pay | Admitting: Primary Care

## 2019-10-05 NOTE — Progress Notes (Signed)
PSN spoke with patient today via phone.  PSN spoke with patient about applying for financial assistance through [facility].  PSN will be helping patient with some monthly expenses through the [place].

## 2019-10-05 NOTE — Telephone Encounter (Signed)
Spoke with patient regarding In-home Palliative referral, explained what Palliative services were and answered all her questions.  Patient has declined services, she said that she was currently receiving home health services and didn't think she feel that she needed Palliative services at this time.  Patient stated that if she changed her mind that she would MD know.  Will notify Billey Chang, NP at the [facility] of above.

## 2019-10-06 ENCOUNTER — Encounter: Payer: 59 | Admitting: Hospice and Palliative Medicine

## 2019-10-06 ENCOUNTER — Other Ambulatory Visit: Payer: Self-pay

## 2019-10-06 ENCOUNTER — Telehealth: Payer: Self-pay | Admitting: *Deleted

## 2019-10-06 ENCOUNTER — Ambulatory Visit: Payer: 59

## 2019-10-06 DIAGNOSIS — C7800 Secondary malignant neoplasm of unspecified lung: Secondary | ICD-10-CM

## 2019-10-06 NOTE — Telephone Encounter (Signed)
I called and spoke with patient. All questions were answered. She is in process of completing ACP documents and will bring those in to be scanned into the chart.

## 2019-10-06 NOTE — Telephone Encounter (Signed)
Patient called reporting that she has questions regarding the forms that you gave her (Living Will, HCPOA, HIPPA) tha she needs to discuss with you and asks for a return call [phone]

## 2019-10-07 ENCOUNTER — Telehealth: Payer: Self-pay | Admitting: Family Medicine

## 2019-10-07 ENCOUNTER — Inpatient Hospital Stay: Payer: 59

## 2019-10-07 ENCOUNTER — Encounter: Payer: Self-pay | Admitting: Oncology

## 2019-10-07 ENCOUNTER — Other Ambulatory Visit: Payer: Self-pay

## 2019-10-07 ENCOUNTER — Telehealth: Payer: Self-pay

## 2019-10-07 ENCOUNTER — Inpatient Hospital Stay (HOSPITAL_BASED_OUTPATIENT_CLINIC_OR_DEPARTMENT_OTHER): Payer: 59 | Admitting: Oncology

## 2019-10-07 VITALS — BP 153/90 | HR 89 | Temp 96.7°F | Resp 18 | Wt 227.5 lb

## 2019-10-07 DIAGNOSIS — Z7189 Other specified counseling: Secondary | ICD-10-CM

## 2019-10-07 DIAGNOSIS — C7800 Secondary malignant neoplasm of unspecified lung: Secondary | ICD-10-CM

## 2019-10-07 DIAGNOSIS — R600 Localized edema: Secondary | ICD-10-CM | POA: Diagnosis not present

## 2019-10-07 DIAGNOSIS — C3490 Malignant neoplasm of unspecified part of unspecified bronchus or lung: Secondary | ICD-10-CM

## 2019-10-07 DIAGNOSIS — C801 Malignant (primary) neoplasm, unspecified: Secondary | ICD-10-CM

## 2019-10-07 DIAGNOSIS — C7931 Secondary malignant neoplasm of brain: Secondary | ICD-10-CM | POA: Diagnosis not present

## 2019-10-07 LAB — COMPREHENSIVE METABOLIC PANEL
ALT: 25 U/L (ref 0–44)
AST: 18 U/L (ref 15–41)
Albumin: 3.4 g/dL — ABNORMAL LOW (ref 3.5–5.0)
Alkaline Phosphatase: 68 U/L (ref 38–126)
Anion gap: 10 (ref 5–15)
BUN: 27 mg/dL — ABNORMAL HIGH (ref 8–23)
CO2: 28 mmol/L (ref 22–32)
Calcium: 8.7 mg/dL — ABNORMAL LOW (ref 8.9–10.3)
Chloride: 100 mmol/L (ref 98–111)
Creatinine, Ser: 0.9 mg/dL (ref 0.44–1.00)
GFR calc Af Amer: >60 mL/min (ref >60)
GFR calc non Af Amer: >60 mL/min (ref >60)
Glucose, Bld: 129 mg/dL — ABNORMAL HIGH (ref 70–99)
Potassium: 4.1 mmol/L (ref 3.5–5.1)
Sodium: 138 mmol/L (ref 135–145)
Total Bilirubin: 0.5 mg/dL (ref 0.3–1.2)
Total Protein: 6.8 g/dL (ref 6.5–8.1)

## 2019-10-07 LAB — CBC WITH DIFFERENTIAL/PLATELET
Abs Immature Granulocytes: 0.38 10*3/uL — ABNORMAL HIGH (ref 0.00–0.07)
Basophils Absolute: 0 10*3/uL (ref 0.0–0.1)
Basophils Relative: 0 %
Eosinophils Absolute: 0 10*3/uL (ref 0.0–0.5)
Eosinophils Relative: 0 %
HCT: 34.2 % — ABNORMAL LOW (ref 36.0–46.0)
Hemoglobin: 11.4 g/dL — ABNORMAL LOW (ref 12.0–15.0)
Immature Granulocytes: 3 %
Lymphocytes Relative: 19 %
Lymphs Abs: 2.8 10*3/uL (ref 0.7–4.0)
MCH: 30.6 pg (ref 26.0–34.0)
MCHC: 33.3 g/dL (ref 30.0–36.0)
MCV: 91.9 fL (ref 80.0–100.0)
Monocytes Absolute: 0.7 10*3/uL (ref 0.1–1.0)
Monocytes Relative: 5 %
Neutro Abs: 11 10*3/uL — ABNORMAL HIGH (ref 1.7–7.7)
Neutrophils Relative %: 73 %
Platelets: 333 10*3/uL (ref 150–400)
RBC: 3.72 MIL/uL — ABNORMAL LOW (ref 3.87–5.11)
RDW: 15 % (ref 11.5–15.5)
WBC: 14.9 10*3/uL — ABNORMAL HIGH (ref 4.0–10.5)
nRBC: 0.1 % (ref 0.0–0.2)

## 2019-10-07 MED ORDER — DEXAMETHASONE 1 MG PO TABS
3.0000 mg | ORAL_TABLET | Freq: Two times a day (BID) | ORAL | 0 refills | Status: DC
Start: 2019-10-07 — End: 2019-10-20

## 2019-10-07 MED ORDER — OMEPRAZOLE 20 MG PO CPDR
20.0000 mg | DELAYED_RELEASE_CAPSULE | Freq: Every day | ORAL | 2 refills | Status: AC
Start: 2019-10-07 — End: ?

## 2019-10-07 NOTE — Telephone Encounter (Signed)
Home Health Verbal Orders - Caller/AgencyJeannene Patella, calling from [place] Number: [phone] Requesting OT/PT/Skilled Nursing/Social Work/Speech Therapy:  Frequency: Nursing 1 w 9 with 2 PRN visits with PT eval

## 2019-10-07 NOTE — Telephone Encounter (Signed)
While here for MD visit patient stated that she does have a nurse from [place] coming out to her house.

## 2019-10-07 NOTE — Progress Notes (Signed)
Patient denies new problems/concerns today and is feeling much better.

## 2019-10-07 NOTE — Progress Notes (Signed)
Hematology/Oncology follow up note [facility] [phone] Fax:([phone]   Patient Care Team: Juline Patch, MD as PCP - General (Family Medicine) Lequita Asal, MD as Referring Physician (Hematology and Oncology) Schnier, Dolores Lory, MD (Vascular Surgery) Noreene Filbert, MD as Radiation Oncologist (Radiation Oncology)  REFERRING PROVIDER: Juline Patch, MD REASON FOR VISIT:  Follow-up on lung cancer management.  HISTORY OF PRESENTING ILLNESS:  Kristina Wright is a  64 y.o.  female with PMH listed below who presents to re-establish care with me for management of Stage IV lung adenocarcinoma.  She has a history of 30-year pack smoking history, former smoker.  Significant family history of cancer.  Her sister follows up with me for breast cancer.  Other family member cancer history listed in the family history section.  Self-report history of cervical cancer in 1989.  INTERVAL HISTORY Kristina Wright is a 64 y.o. female who has above history reviewed by me today present for reestablish care. Patient was last seen by me on 04/08/2018.  At that time she transferred her care to my colleague Dr. Mike Gip.  Patient recently called and prefers to transfer her care back to me.  Extensive medical records review was performed by me.  I also discussed with Dr. Mike Gip via Secure chat. #02/24/2018, right axillary lymph node biopsy showed adenocarcinoma  compatible with lung primary.  Stage IV lung adenocarcinoma  initially cT1N3 M1 + EGFR L861Q. Mutation, PDL-1 60% started osimertinib 03/25/2018.  03/10/2018 brain MRI showed no brain metastasis  #06/17/2018 CT chest showed marked interval reduction of mediastinal and right hilar lymphadenopathy.  Slight increase of left lower lobe pulmonary nodule-the nodule was not hypermetabolic on previous PET scan. #CT 08/23/2018 revealed a stable 10 mm left lower lobe no nodule.  Tree-in-bud nodularity seen in the  medial right upper lobe on the previous study likely improved.  Stable 14 mm left breast nodule. #11/24/2018, chest CT reviewed enlarging left lower lobe pulmonary nodule, 1.5 x 1.4 cm concerning for progressive malignancy. Tissue diagnosis was recommended and patient was referred to see pulmonology at that point.   PET 12/29/2018 showed left lower lobe superior segment nodule has SUV of 8, highly suspicious for carcinoma.  Remainder of the adenocarcinoma appears to be responding well.    Patient declined biopsy.  Patient was referred to establish care with Dr. Baruch Gouty ball 01/31/2019 for consideration of SBRT.  Patient received SBRT to left lower lobe nodule 02/03/2019-02/28/2019, Patient was continued on osimertinib. 04/20/2019 CT chest showed interval reduction of the left lower lobe nodule following stereotactic body radiation treatments.  No new or progressive malignancy. 07/20/2019 CT chest reviewed continued evolutionary changes secondary to radiation involving the lingula and superior segment of the left lower lobe.  No specific findings to suggest residual or recurrent tumor. Patient has headache.  08/03/2019, patient presented to emergency room after developing partial seizure episodes CT head concerning for hyperdense frontal lesion concerning for metastasis. 08/03/2019, MRI brain with and without contrast showed right frontal mass likely reflecting metastasis.  Significant surrounding edema with mild regional mass-effect including subcentimeter leftward midline shift. Patient was seen by neurosurgery while she was in the ER.  Patient was started on Decadron and Keppra. Surgery versus stereotactic radio surgery was discussed.  Patient was advised not to drive. 08/09/2019, restaging CT scan showed chest CT stable since 07/20/2019 without new or progressive findings.  12 mm short axis left hilar node is probably stable since prior study although difficult access  given lack of intravenous contrast on  the earlier exams.  Close attention on follow-up.  No findings to suggest metastatic disease in the abdomen/pelvis.  2.5 cm cystic lesion left ovary.  Not hypermetabolic on previous PET scan  #08/23/2019 MRI brain with and without contrast showed a solitary enhancing mass at the right frontal lobe now measuring 2.8 cm.  Inseparable from overlying dura which appears mildly thickened.  Favoring intra-axial rather than extra-axial metastasis.  Mild regression of vasogenic edema and mass-effect since April 2021 study.  #Patient underwent stereotactic radiation surgery on 09/12/2019. Currently patient has been tapered off dexamethasone.  She continues on Keppra.  Patient has also developed shingles and thrush which has been treated symptomatically.   She was recently seen by nurse practitioner [name].  She also has called reporting concerns of lower extremity swelling and skin concerns. Patient complained about bites on her feet and legs.  Patient was seen by primary care provider Dr. Ronnald Ramp and was given a prescription of nystatin cream for her legs.   Stat MRI of the brain was done on 09/29/2019 which showed interval increasing enhancement at the site of solitary right frontal operculum metastasis now encompassing region of 3.4 cm.  As before the mass is inseparable from the overlying dura, favoring intraaxial.  Surrounding edema is also notably increased. Patient prefers to switch her care back to me for further evaluation.   INTERVAL HISTORY Kristina Wright is a 64 y.o. female who has above history reviewed by me today presents for follow up visit for management of lung cancer, and cancer related symptoms. Problems and complaints are listed below: Patient is currently on dexamethasone 4 mg twice daily.  Hallucination, headache symptoms have significantly improved. Continue to have bilateral lower extremity edema, bilateral lower extremity venous ultrasound showed negative for DVT. Patient is on  Lasix 20 mg daily. Patient is accompanied by his son [name] today.   Review of Systems  Constitutional: Negative for chills, fever, malaise/fatigue and weight loss.  HENT: Negative for nosebleeds and sore throat.   Eyes: Negative for double vision, photophobia and redness.  Respiratory: Negative for cough, shortness of breath and wheezing.   Cardiovascular: Positive for leg swelling. Negative for chest pain, palpitations and orthopnea.  Gastrointestinal: Negative for abdominal pain, blood in stool, nausea and vomiting.  Genitourinary: Negative for dysuria.  Musculoskeletal: Negative for back pain, myalgias and neck pain.  Skin: Negative for itching and rash.       Bilateral upper lower extremity/inner thigh erythematous changes.  Neurological: Negative for dizziness, tingling, tremors and headaches.  Endo/Heme/Allergies: Negative for environmental allergies. Does not bruise/bleed easily.  Psychiatric/Behavioral: Negative for depression and hallucinations. The patient is not nervous/anxious.     MEDICAL HISTORY:  Past Medical History:  Diagnosis Date  . Cancer ([place]) 1989   cervical  . Depression   . Family history of breast cancer   . Family history of colon cancer   . Family history of ovarian cancer   . Family history of stomach cancer   . Family history of throat cancer   . Hyperlipidemia   . Hypertension   . Malignant neoplasm of lung ([place]) 03/01/2018  . Thyroid disease     SURGICAL HISTORY: Past Surgical History:  Procedure Laterality Date  . BILATERAL CARPAL TUNNEL RELEASE    . BREAST CYST ASPIRATION Left   . LUNG BIOPSY    . PARTIAL HYSTERECTOMY    . PORTA CATH INSERTION N/A 03/03/2018   Procedure: PORTA CATH  INSERTION;  Surgeon: Katha Cabal, MD;  Location: [place] [facility];  Service: Cardiovascular;  Laterality: N/A;  . TUBAL LIGATION      SOCIAL HISTORY: Social History   Socioeconomic History  . Marital status: Single    Spouse name: Not on file    . Number of children: 3  . Years of education: Not on file  . Highest education level: Not on file  Occupational History  . Not on file  Tobacco Use  . Smoking status: Former Smoker    Packs/day: 1.00    Years: 25.00    Pack years: 25.00    Types: Cigarettes    Quit date: 11/19/2017    Years since quitting: 1.8  . Smokeless tobacco: Never Used  Vaping Use  . Vaping Use: Never used  Substance and Sexual Activity  . Alcohol use: No  . Drug use: Yes    Types: Marijuana    Comment: occasional  . Sexual activity: Not on file  Other Topics Concern  . Not on file  Social History Narrative  . Not on file   Social Determinants of Health   Financial Resource Strain:   . Difficulty of Paying Living Expenses:   Food Insecurity:   . Worried About Charity fundraiser in the Last Year:   . Arboriculturist in the Last Year:   Transportation Needs:   . Film/video editor (Medical):   Marland Kitchen Lack of Transportation (Non-Medical):   Physical Activity:   . Days of Exercise per Week:   . Minutes of Exercise per Session:   Stress:   . Feeling of Stress :   Social Connections:   . Frequency of Communication with Friends and Family:   . Frequency of Social Gatherings with Friends and Family:   . Attends Religious Services:   . Active Member of Clubs or Organizations:   . Attends Archivist Meetings:   Marland Kitchen Marital Status:   Intimate Partner Violence:   . Fear of Current or Ex-Partner:   . Emotionally Abused:   Marland Kitchen Physically Abused:   . Sexually Abused:     FAMILY HISTORY: Family History  Problem Relation Age of Onset  . Colon cancer Mother 24  . Colon cancer Maternal Grandmother 78  . Hypertension Father   . Breast cancer Sister 51  . Throat cancer Brother   . Heart attack Maternal Aunt   . Ovarian cancer Maternal Aunt 70  . Stomach cancer Paternal Grandmother        dx >50  . Throat cancer Brother     ALLERGIES:  has No Known Allergies.  MEDICATIONS:  Current  Outpatient Medications  Medication Sig Dispense Refill  . ALPRAZolam (XANAX) 0.25 MG tablet Take 1 tablet (0.25 mg total) by mouth at bedtime as needed. for anxiety AS NEEDED ONLY! 30 tablet 0  . aspirin EC 81 MG tablet Take 81 mg by mouth daily.    Marland Kitchen atorvastatin (LIPITOR) 10 MG tablet Take 1 tablet (10 mg total) by mouth daily. 90 tablet 1  . busPIRone (BUSPAR) 5 MG tablet Take 1 tablet (5 mg total) by mouth daily. 90 tablet 1  . chlorhexidine (PERIDEX) 0.12 % solution Place 15-68m of solution in warm water and soak fingers QID. 473 mL 0  . clopidogrel (PLAVIX) 75 MG tablet Take 1 tablet (75 mg total) by mouth daily. 90 tablet 0  . dexamethasone (DECADRON) 4 MG tablet Take 1 tablet (4 mg total) by mouth  2 (two) times daily. 28 tablet 1  . FLUoxetine (PROZAC) 10 MG capsule Take 1 capsule (10 mg total) by mouth daily. 90 capsule 1  . furosemide (LASIX) 20 MG tablet Take 1 tablet (20 mg total) by mouth daily. 30 tablet 1  . hydrOXYzine (ATARAX/VISTARIL) 10 MG tablet Take 1 tablet (10 mg total) by mouth daily. (Patient taking differently: Take 10 mg by mouth at bedtime. ) 30 tablet 5  . levETIRAcetam (KEPPRA) 500 MG tablet Take 1 tablet (500 mg total) by mouth 2 (two) times daily. 60 tablet 2  . levothyroxine (SYNTHROID) 112 MCG tablet Take 1 tablet (112 mcg total) by mouth daily. 90 tablet 1  . lidocaine (XYLOCAINE) 2 % solution Use as directed 15 mLs in the mouth or throat as needed for mouth pain. 100 mL 0  . lidocaine-prilocaine (EMLA) cream Apply 1 application topically as needed.    Marland Kitchen lisinopril-hydrochlorothiazide (ZESTORETIC) 20-25 MG tablet Take 1 tablet by mouth daily. 90 tablet 0  . Multiple Vitamins-Minerals (MULTIVITAMIN ADULT PO) Take 1 tablet by mouth daily.     . mupirocin ointment (BACTROBAN) 2 % Apply 1 application topically 2 (two) times daily. 22 g 0  . nystatin cream (MYCOSTATIN) Apply 1 application topically 2 (two) times daily. 30 g 0  . Olopatadine HCl 0.2 % SOLN Apply 1  drop to eye in the morning and at bedtime. 2.5 mL 1  . osimertinib mesylate (TAGRISSO) 80 MG tablet Take 1 tablet (80 mg total) by mouth daily. 30 tablet 2  . oxyCODONE (OXY IR/ROXICODONE) 5 MG immediate release tablet Take 1 tablet (5 mg total) by mouth every 6 (six) hours as needed for severe pain. 30 tablet 0  . umeclidinium-vilanterol (ANORO ELLIPTA) 62.5-25 MCG/INH AEPB Inhale 1 puff into the lungs daily. 60 each 5  . VENTOLIN HFA 108 (90 Base) MCG/ACT inhaler Inhale 1-2 puffs into the lungs every 6 (six) hours as needed for wheezing or shortness of breath. 18 g 1  . dexamethasone (DECADRON) 1 MG tablet Take 3 tablets (3 mg total) by mouth 2 (two) times daily with a meal. 42 tablet 0  . omeprazole (PRILOSEC) 20 MG capsule Take 1 capsule (20 mg total) by mouth daily. 30 capsule 2  . valACYclovir (VALTREX) 1000 MG tablet Take 1 tablet (1,000 mg total) by mouth 3 (three) times daily. (Patient not taking: Reported on 10/07/2019) 21 tablet 1   No current facility-administered medications for this visit.     PHYSICAL EXAMINATION: ECOG PERFORMANCE STATUS: 1 - Symptomatic but completely ambulatory Vitals:   10/07/19 1318  BP: (!) 153/90  Pulse: 89  Resp: 18  Temp: (!) 96.7 F (35.9 C)   Filed Weights   10/07/19 1318  Weight: 227 lb 8 oz (103.2 kg)    Physical Exam Constitutional:      General: She is not in acute distress. HENT:     Head: Normocephalic and atraumatic.  Eyes:     General: No scleral icterus.    Pupils: Pupils are equal, round, and reactive to light.  Cardiovascular:     Rate and Rhythm: Normal rate and regular rhythm.     Heart sounds: Normal heart sounds.  Pulmonary:     Effort: Pulmonary effort is normal. No respiratory distress.     Breath sounds: No wheezing.     Comments: Decreased breath sounds bilaterally Abdominal:     General: Bowel sounds are normal. There is no distension.     Palpations: Abdomen is soft. There  is no mass.     Tenderness: There is  no abdominal tenderness.  Musculoskeletal:        General: Swelling present. No deformity. Normal range of motion.     Cervical back: Normal range of motion and neck supple.     Comments: Bilateral lower extremity 3+ edema. Upper inner thigh erythematous skin changes covered by nystatin cream  Skin:    General: Skin is warm and dry.     Findings: No erythema or rash.  Neurological:     Mental Status: She is alert and oriented to person, place, and time.     Cranial Nerves: No cranial nerve deficit.     Coordination: Coordination normal.  Psychiatric:        Mood and Affect: Mood normal.      LABORATORY DATA:  I have reviewed the data as listed Lab Results  Component Value Date   WBC 14.9 (H) 10/07/2019   HGB 11.4 (L) 10/07/2019   HCT 34.2 (L) 10/07/2019   MCV 91.9 10/07/2019   PLT 333 10/07/2019   Recent Labs    09/08/19 1644 09/08/19 1644 09/14/19 1134 09/22/19 1112 10/07/19 1251  NA 132*   < > 130* 135 138  K 4.3   < > 4.3 4.0 4.1  CL 97*   < > 96* 102 100  CO2 25   < > _0 GLUCOSE 163*   < > 100* 147* 129*  BUN 28*   < > 23 20 27*  CREATININE 0.93   < > 0.96 0.78 0.90  CALCIUM 8.9   < > 8.7* 8.6* 8.7*  GFRNONAA >60   < > >60 >60 >60  GFRAA >60   < > >60 >60 >60  PROT 6.7  --   --  6.3* 6.8  ALBUMIN 3.8  --   --  3.4* 3.4*  AST 35  --   --  25 18  ALT 51*  --   --  38 25  ALKPHOS 61  --   --  60 68  BILITOT 0.7  --   --  0.4 0.5   < > = values in this interval not displayed.   Iron/TIBC/Ferritin/ %Sat No results found for: IRON, TIBC, FERRITIN, IRONPCTSAT   RADIOGRAPHIC STUDIES: I have personally reviewed the radiological images as listed and agreed with the findings in the report. CT HEAD WO CONTRAST  Result Date: 08/03/2019 CLINICAL DATA:  Transit ischemic attack. EXAM: CT HEAD WITHOUT CONTRAST TECHNIQUE: Contiguous axial images were obtained from the base of the skull through the vertex without intravenous contrast. COMPARISON:  03/10/2018 MRI  head. FINDINGS: Brain: There is a peripherally hyperdense 2.0 x 1.9 x 2.5 cm right frontal lesion (2:18, 4:25), new since prior exam. New right frontal/perilesional vasogenic edema likely involving the right basal ganglia, posterior limb of the right internal capsule and thalamus. Partial effacement of the right lateral ventricle with 4 mm leftward midline shift (2:17). No ventriculomegaly. No extra-axial fluid collection. Vascular: No hyperdense vessel. Bilateral carotid siphon atherosclerotic calcifications. Skull: Negative for fracture or focal lesion. Sinuses/Orbits: Normal orbits. Clear paranasal sinuses. No mastoid effusion. Other: None. IMPRESSION: Peripherally hyperdense 2.5 cm right frontal lesion is concerning for hemorrhagic metastases versus primary neoplasm. Further evaluation with MRI brain with and without contrast is recommended. Right frontal perilesional edema involving the right basal ganglia and thalamus. Partial right lateral ventricle effacement with 4 mm leftward midline shift. These results were called by telephone at the time  of interpretation on 08/03/2019 at 1:22 pm to provider Blake Divine , who verbally acknowledged these results. Electronically Signed   By: Primitivo Gauze M.D.   On: 08/03/2019 13:26   CT CHEST WO CONTRAST  Result Date: 07/20/2019 CLINICAL DATA:  Status post SBRT or left lower lobe lung cancer. EXAM: CT CHEST WITHOUT CONTRAST TECHNIQUE: Multidetector CT imaging of the chest was performed following the standard protocol without IV contrast. COMPARISON:  04/20/2019 FINDINGS: Cardiovascular: The heart size appears within normal limits. Aortic atherosclerosis. No pericardial effusion. Left circumflex coronary artery calcification. Mediastinum/Nodes: Normal appearance of the thyroid gland. The trachea appears patent and is midline. Small hiatal hernia. No enlarged mediastinal or hilar adenopathy. Lungs/Pleura: Paraseptal and centrilobular emphysema. There is a  bandlike area fibrosis and architectural distortion involving the lingula and superior segment of left lower lobe, image 93/3. Findings reflect changes secondary to external beam radiation. Underlying lung lesion within the left lower lobe is no longer measurable separate from these changes. No new lung lesion. Upper Abdomen: No acute abnormality. Musculoskeletal: No chest wall mass or suspicious bone lesions identified. IMPRESSION: 1. Continued evolutionary changes secondary to external beam radiation postradiation involving the lingula and superior segment of left lower lobe. No specific findings identified to suggest residual or recurrence of tumor. 2. Emphysema and aortic atherosclerosis. 3. Left circumflex coronary artery calcification. Aortic Atherosclerosis (ICD10-I70.0) and Emphysema (ICD10-J43.9). Electronically Signed   By: Kerby Moors M.D.   On: 07/20/2019 15:37   CT CHEST W CONTRAST  Result Date: 08/09/2019 CLINICAL DATA:  Metastatic lung cancer. EXAM: CT CHEST, ABDOMEN, AND PELVIS WITH CONTRAST TECHNIQUE: Multidetector CT imaging of the chest, abdomen and pelvis was performed following the standard protocol during bolus administration of intravenous contrast. CONTRAST:  156m OMNIPAQUE IOHEXOL 300 MG/ML  SOLN COMPARISON:  CT chest 07/20/2019. PET-CT 12/29/2018. FINDINGS: CT CHEST FINDINGS Cardiovascular: The heart size is normal. No substantial pericardial effusion. Atherosclerotic calcification is noted in the wall of the thoracic aorta. Right Port-A-Cath tip is positioned in the upper right atrium. Mediastinum/Nodes: No mediastinal lymphadenopathy. 12 mm short axis left hilar node visible on 31/2. Probably present on prior study although difficult to assess given lack of intravenous contrast on the earlier exams. No right hilar lymphadenopathy. Tiny hiatal hernia. The esophagus has normal imaging features. There is no axillary lymphadenopathy. Lungs/Pleura: Bandlike interstitial and airspace  opacity in the lateral left upper and lower lobes is similar to prior although the collapse/consolidative opacity in the lingula has decreased slightly in the interval. Imaging features likely reflect sequelae of radiation therapy. No suspicious pulmonary nodule or mass. No pleural effusion. Musculoskeletal: No worrisome lytic or sclerotic osseous abnormality. CT ABDOMEN PELVIS FINDINGS Hepatobiliary: No suspicious focal abnormality within the liver parenchyma. There is no evidence for gallstones, gallbladder wall thickening, or pericholecystic fluid. No intrahepatic or extrahepatic biliary dilation. Pancreas: No focal mass lesion. No dilatation of the main duct. No intraparenchymal cyst. No peripancreatic edema. Spleen: No splenomegaly. No focal mass lesion. Adrenals/Urinary Tract: No adrenal nodule or mass. Kidneys unremarkable. No evidence for hydroureter. The urinary bladder appears normal for the degree of distention. Stomach/Bowel: Tiny hiatal hernia. Stomach is unremarkable. No gastric wall thickening. No evidence of outlet obstruction. Duodenum is normally positioned as is the ligament of Treitz. Duodenal diverticulum evident. No small bowel wall thickening. No small bowel dilatation. The terminal ileum is normal. The appendix is normal. No gross colonic mass. No colonic wall thickening. Diverticular changes are noted in the left colon without evidence of  diverticulitis. Vascular/Lymphatic: There is abdominal aortic atherosclerosis without aneurysm. There is no gastrohepatic or hepatoduodenal ligament lymphadenopathy. No retroperitoneal or mesenteric lymphadenopathy. No pelvic sidewall lymphadenopathy. Reproductive: The uterus is unremarkable. 2.5 cm cystic lesion left ovary is stable since PET-CT 12/29/2018. Other: No intraperitoneal free fluid. Musculoskeletal: No worrisome lytic or sclerotic osseous abnormality. IMPRESSION: 1. Chest CT stable since 07/20/2019 without new or progressive findings. 2. 12 mm  short axis left hilar lymph node is probably stable since prior study although difficult to assess given lack of intravenous contrast on the earlier exams. Close attention on follow-up recommended. 3. No findings to suggest metastatic disease in the abdomen/pelvis. 4. Left colonic diverticulosis without diverticulitis. 5. Stable 2.5 cm cystic lesion left ovary, not hypermetabolic on previous PET imaging and likely benign. 6. Aortic Atherosclerosis (ICD10-I70.0). Electronically Signed   By: Misty Stanley M.D.   On: 08/09/2019 16:54   MR Brain W Wo Contrast  Result Date: 09/29/2019 CLINICAL DATA:  Brain metastasis. Intractable headache, unspecified chronicity pattern, unspecified headache type. Additional history provided: Headache/possible hallucinations/history of brain mets. Status post brain radiation, patient reports severe headache for several days. EXAM: MRI HEAD WITHOUT AND WITH CONTRAST TECHNIQUE: Multiplanar, multiecho pulse sequences of the brain and surrounding structures were obtained without and with intravenous contrast. CONTRAST:  44m GADAVIST GADOBUTROL 1 MMOL/ML IV SOLN COMPARISON:  Prior brain MRI examinations 08/31/2019 and earlier FINDINGS: Brain: There has been an interval increase in enhancement at site of a solitary metastasis centered within the right frontal operculum, now encompassing a region of 3.4 cm (previously 2.8 cm). As before, portions of the mass are inseparable from the overlying dura and there is adjacent mild dural thickening and enhancement. Surrounding edema has notably increased. There is increased mass effect with increased partial effacement of the right lateral ventricle and now 6 mm leftward midline shift measured at the level of the septum pellucidum. Redemonstrated small foci of SWI signal loss along the anterolateral aspect of the mass, which may reflect small foci of non-acute hemorrhage. No new intracranial enhancing metastasis is demonstrated. Stable nonspecific  symmetric enhancement within the IAC fundus bilaterally. Additional patchy T2/FLAIR hyperintensity within the cerebral white matter is nonspecific, but consistent with chronic small vessel ischemic disease. There is no acute infarct. No extra-axial fluid collection. Vascular: Expected proximal arterial flow voids. Skull and upper cervical spine: No focal marrow lesion. Sinuses/Orbits: Visualized orbits show no acute finding. Mild ethmoid sinus mucosal thickening. No significant mastoid effusion. IMPRESSION: Interval increase in enhancement at site of a solitary right frontal operculum metastasis, now encompassing a region of 3.4 cm (previously 2.8 cm). As before, the mass is inseparable from the overlying dura, although the lesion is favored intra-axial. Surrounding edema has also notably increased. This increased enhancement and edema may reflect inflammatory changes from recent radiation therapy. Attention recommended on follow-up. Increased mass effect with increased partial effacement of the right lateral ventricle and now 6 mm leftward midline shift. No new intracranial metastases are identified. Electronically Signed   By: KKellie SimmeringDO   On: 09/29/2019 13:56   MR Brain W Wo Contrast  Result Date: 08/31/2019 CLINICAL DATA:  64year old female with lung cancer and solitary right frontal metastasis by MRI last month. SRS treatment planning. EXAM: MRI HEAD WITHOUT AND WITH CONTRAST TECHNIQUE: Multiplanar, multiecho pulse sequences of the brain and surrounding structures were obtained without and with intravenous contrast. CONTRAST:  955mGADAVIST GADOBUTROL 1 MMOL/ML IV SOLN COMPARISON:  Brain MRI 08/03/2019. brain MRI 03/10/2018. FINDINGS: BRAIN  Lobulated and enhancing peripherally located mass at the frontal operculum now measures up to 28 mm diameter and is inseparable from the overlying dura with mild adjacent dural thickening as before. However, this does appear to be intra-axial, with no definite cleft  visible on T2 imaging. Regional T2 and FLAIR hyperintensity in keeping with vasogenic edema has mildly regressed. No midline shift. Mildly improved patency of the right lateral ventricle since last month. No other enhancing brain lesion or cerebral edema. No other dural thickening. Anterior left frontal lobe developmental venous anomaly (normal variant). Other Brain findings: No restricted diffusion to suggest acute infarction. No ventriculomegaly, extra-axial collection or acute intracranial hemorrhage. Cervicomedullary junction and pituitary are within normal limits. Axial images raise the possibility of bilateral IAC fundus enhancement (such as series 14, image 59 on the right), but this is stable since 2019 and also not definitely correlated on the coronal or sagittal postcontrast images. Vascular: Major intracranial vascular flow voids are stable. The major dural venous sinuses are enhancing and appear to be patent. Skull and upper cervical spine: Negative visible cervical spine and spinal cord. Bone marrow signal is stable and within normal limits. Sinuses/Orbits: Negative orbits. Paranasal Visualized paranasal sinuses and mastoids are stable and well pneumatized. Other: Scalp and face soft tissues appear negative. IMPRESSION: 1. Solitary enhancing mass at the right frontal operculum now measures up to 28 mm diameter. As before this is inseparable from the overlying dura which appears mildly thickened, although I favor this is an intra-axial rather than extra-axial metastasis. 2. Mild regression of vasogenic edema and mass effect since last month. No midline shift. 3. No new metastatic disease identified. Electronically Signed   By: Genevie Ann M.D.   On: 08/31/2019 12:50   MR Brain W and Wo Contrast  Result Date: 08/03/2019 CLINICAL DATA:  Abnormal CT, history of lung cancer EXAM: MRI HEAD WITHOUT AND WITH CONTRAST TECHNIQUE: Multiplanar, multiecho pulse sequences of the brain and surrounding structures were  obtained without and with intravenous contrast. CONTRAST:  18m GADAVIST GADOBUTROL 1 MMOL/ML IV SOLN COMPARISON:  MRI 2019, CT earlier same day FINDINGS: Brain: Corresponding to abnormality on CT, there is an enhancing lesion of the peripheral right frontal lobe involving or abutting the lateral precentral gyrus. This measures approximately 2.1 x 2.1 x 2.5 cm. There is significant surrounding edema resulting in regional sulcal effacement, partial effacement of the right lateral ventricle, and mild leftward midline shift measuring 4 mm. No associated reduced diffusion. There is mild susceptibility hypointensity, which may reflect intralesional blood products. There is no additional mass or abnormal enhancement. No hydrocephalus. Additional patchy T2 hyperintensity in the supratentorial white matter is nonspecific but may reflect mild chronic microvascular ischemic changes. No acute infarction. Vascular: Major vessel flow voids at the skull base are preserved. Skull and upper cervical spine: Normal marrow signal is preserved. Sinuses/Orbits: Trace paranasal sinus mucosal thickening. Orbits are unremarkable. Other: Sella is unremarkable. IMPRESSION: Right frontal mass likely reflecting a metastasis. Significant surrounding edema with mild regional mass effect including subcentimeter leftward midline shift. No hydrocephalus. Electronically Signed   By: PMacy MisM.D.   On: 08/03/2019 18:28   CT ABDOMEN PELVIS W CONTRAST  Result Date: 08/09/2019 CLINICAL DATA:  Metastatic lung cancer. EXAM: CT CHEST, ABDOMEN, AND PELVIS WITH CONTRAST TECHNIQUE: Multidetector CT imaging of the chest, abdomen and pelvis was performed following the standard protocol during bolus administration of intravenous contrast. CONTRAST:  1075mOMNIPAQUE IOHEXOL 300 MG/ML  SOLN COMPARISON:  CT chest 07/20/2019. PET-CT  12/29/2018. FINDINGS: CT CHEST FINDINGS Cardiovascular: The heart size is normal. No substantial pericardial effusion.  Atherosclerotic calcification is noted in the wall of the thoracic aorta. Right Port-A-Cath tip is positioned in the upper right atrium. Mediastinum/Nodes: No mediastinal lymphadenopathy. 12 mm short axis left hilar node visible on 31/2. Probably present on prior study although difficult to assess given lack of intravenous contrast on the earlier exams. No right hilar lymphadenopathy. Tiny hiatal hernia. The esophagus has normal imaging features. There is no axillary lymphadenopathy. Lungs/Pleura: Bandlike interstitial and airspace opacity in the lateral left upper and lower lobes is similar to prior although the collapse/consolidative opacity in the lingula has decreased slightly in the interval. Imaging features likely reflect sequelae of radiation therapy. No suspicious pulmonary nodule or mass. No pleural effusion. Musculoskeletal: No worrisome lytic or sclerotic osseous abnormality. CT ABDOMEN PELVIS FINDINGS Hepatobiliary: No suspicious focal abnormality within the liver parenchyma. There is no evidence for gallstones, gallbladder wall thickening, or pericholecystic fluid. No intrahepatic or extrahepatic biliary dilation. Pancreas: No focal mass lesion. No dilatation of the main duct. No intraparenchymal cyst. No peripancreatic edema. Spleen: No splenomegaly. No focal mass lesion. Adrenals/Urinary Tract: No adrenal nodule or mass. Kidneys unremarkable. No evidence for hydroureter. The urinary bladder appears normal for the degree of distention. Stomach/Bowel: Tiny hiatal hernia. Stomach is unremarkable. No gastric wall thickening. No evidence of outlet obstruction. Duodenum is normally positioned as is the ligament of Treitz. Duodenal diverticulum evident. No small bowel wall thickening. No small bowel dilatation. The terminal ileum is normal. The appendix is normal. No gross colonic mass. No colonic wall thickening. Diverticular changes are noted in the left colon without evidence of diverticulitis.  Vascular/Lymphatic: There is abdominal aortic atherosclerosis without aneurysm. There is no gastrohepatic or hepatoduodenal ligament lymphadenopathy. No retroperitoneal or mesenteric lymphadenopathy. No pelvic sidewall lymphadenopathy. Reproductive: The uterus is unremarkable. 2.5 cm cystic lesion left ovary is stable since PET-CT 12/29/2018. Other: No intraperitoneal free fluid. Musculoskeletal: No worrisome lytic or sclerotic osseous abnormality. IMPRESSION: 1. Chest CT stable since 07/20/2019 without new or progressive findings. 2. 12 mm short axis left hilar lymph node is probably stable since prior study although difficult to assess given lack of intravenous contrast on the earlier exams. Close attention on follow-up recommended. 3. No findings to suggest metastatic disease in the abdomen/pelvis. 4. Left colonic diverticulosis without diverticulitis. 5. Stable 2.5 cm cystic lesion left ovary, not hypermetabolic on previous PET imaging and likely benign. 6. Aortic Atherosclerosis (ICD10-I70.0). Electronically Signed   By: Misty Stanley M.D.   On: 08/09/2019 16:54   US Venous Img Lower Bilateral (DVT)  Result Date: 10/04/2019 CLINICAL DATA:  Bilateral lower extremity pain and edema. History of lung cancer. Evaluate for DVT. EXAM: BILATERAL LOWER EXTREMITY VENOUS DOPPLER ULTRASOUND TECHNIQUE: Gray-scale sonography with graded compression, as well as color Doppler and duplex ultrasound were performed to evaluate the lower extremity deep venous systems from the level of the common femoral vein and including the common femoral, femoral, profunda femoral, popliteal and calf veins including the posterior tibial, peroneal and gastrocnemius veins when visible. The superficial great saphenous vein was also interrogated. Spectral Doppler was utilized to evaluate flow at rest and with distal augmentation maneuvers in the common femoral, femoral and popliteal veins. COMPARISON:  None. FINDINGS: RIGHT LOWER EXTREMITY  Common Femoral Vein: No evidence of thrombus. Normal compressibility, respiratory phasicity and response to augmentation. Saphenofemoral Junction: No evidence of thrombus. Normal compressibility and flow on color Doppler imaging. Profunda Femoral Vein: No evidence  of thrombus. Normal compressibility and flow on color Doppler imaging. Femoral Vein: No evidence of thrombus. Normal compressibility, respiratory phasicity and response to augmentation. Popliteal Vein: No evidence of thrombus. Normal compressibility, respiratory phasicity and response to augmentation. Calf Veins: No evidence of thrombus. Normal compressibility and flow on color Doppler imaging. Superficial Great Saphenous Vein: No evidence of thrombus. Normal compressibility. Venous Reflux:  None. Other Findings:  None. LEFT LOWER EXTREMITY Common Femoral Vein: No evidence of thrombus. Normal compressibility, respiratory phasicity and response to augmentation. Saphenofemoral Junction: No evidence of thrombus. Normal compressibility and flow on color Doppler imaging. Profunda Femoral Vein: No evidence of thrombus. Normal compressibility and flow on color Doppler imaging. Femoral Vein: No evidence of thrombus. Normal compressibility, respiratory phasicity and response to augmentation. Popliteal Vein: No evidence of thrombus. Normal compressibility, respiratory phasicity and response to augmentation. Calf Veins: No evidence of thrombus. Normal compressibility and flow on color Doppler imaging. Superficial Great Saphenous Vein: No evidence of thrombus. Normal compressibility. Venous Reflux:  None. Other Findings:  None. IMPRESSION: No evidence of DVT within either lower extremity. Electronically Signed   By: Sandi Mariscal M.D.   On: 10/04/2019 16:21    ASSESSMENT & PLAN:  1. Bilateral lower extremity edema   2. Brain metastasis ([place])   3. Adenocarcinoma of lung, stage 4, unspecified laterality ([place])   4. Goals of care, counseling/discussion   Cancer  Staging Malignant neoplasm of lung [facility]) Staging form: Lung, AJCC 8th Edition - Clinical stage from 03/12/2018: Stage IV (cT1, cN3, pM1) - Signed by Earlie Server, MD on 03/12/2018  # EGFR mutation [[id number]] positive Stage IV lung adenocarcinoma.  On osimertinib 80 mg daily,  She developed frontal lobe CNS metastasis while on TKI.  Status post SRS Recent MRI brain was independently reviewed and discussed with radiation oncology Dr. Baruch Gouty. Her radiation oncology, no additional intervention at this point.  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 Continue dexamethasone 4 mg twice daily for 1 more week After that advised patient to decrease to dexamethasone 3 mg twice daily.  GI prophylaxis with omeprazole 20 mg daily. Continue Keppra 500 mg daily twice daily. Advised patient about no driving.   #Bilateral lower extremity edema Negative for DVT. Check 2D echo. Likely due to bilateral lower extremity vein insufficiency.  Recommend patient to use compression stocking. Encourage patient to make a follow-up appointment with vascular surgery Dr. Lucky Cowboy for further evaluation. Continue Lasix 20 mg daily.  Recommend patient to continue follow-up with palliative care service. Recommend home care nurse from medicine management. Goals of care was discussed with patient and son.  They understand that patient's condition is incurable. Treatment is with palliative intent.   Follow-up in 2 weeks for further discussion.   We spent sufficient time to discuss many aspect of care, questions were answered to patient's satisfaction. The patient knows to call the clinic with any problems questions or concerns.   Earlie Server, MD, PhD Hematology Oncology [facility] at [facility] Pager- [phone] 10/07/2019

## 2019-10-07 NOTE — Telephone Encounter (Signed)
Received correspendence via fax that home health services have been denied.  I called to confirm denial with [facility].  [facility] services have been denied and we would need to initiate an appeals if services will be needed.

## 2019-10-10 ENCOUNTER — Encounter: Payer: Self-pay | Admitting: Family Medicine

## 2019-10-10 ENCOUNTER — Telehealth: Payer: Self-pay | Admitting: *Deleted

## 2019-10-10 ENCOUNTER — Ambulatory Visit (INDEPENDENT_AMBULATORY_CARE_PROVIDER_SITE_OTHER): Payer: 59 | Admitting: Family Medicine

## 2019-10-10 ENCOUNTER — Other Ambulatory Visit: Payer: Self-pay

## 2019-10-10 VITALS — BP 120/80 | HR 84 | Ht 69.0 in | Wt 227.0 lb

## 2019-10-10 DIAGNOSIS — L739 Follicular disorder, unspecified: Secondary | ICD-10-CM | POA: Diagnosis not present

## 2019-10-10 DIAGNOSIS — L03116 Cellulitis of left lower limb: Secondary | ICD-10-CM | POA: Diagnosis not present

## 2019-10-10 DIAGNOSIS — I890 Lymphedema, not elsewhere classified: Secondary | ICD-10-CM | POA: Diagnosis not present

## 2019-10-10 MED ORDER — FUROSEMIDE 20 MG PO TABS: 20.0000 mg | ORAL_TABLET | Freq: Two times a day (BID) | ORAL | 3 refills | Status: DC

## 2019-10-10 MED ORDER — MUPIROCIN 2 % EX OINT: 1.0000 "application " | TOPICAL_OINTMENT | Freq: Two times a day (BID) | CUTANEOUS | 0 refills | Status: AC

## 2019-10-10 MED ORDER — NYSTATIN 100000 UNIT/GM EX CREA: 1.0000 "application " | TOPICAL_CREAM | Freq: Two times a day (BID) | CUTANEOUS | 0 refills | Status: AC

## 2019-10-10 MED ORDER — AMOXICILLIN-POT CLAVULANATE 875-125 MG PO TABS: 1.0000 | ORAL_TABLET | Freq: Two times a day (BID) | ORAL | 0 refills | Status: DC

## 2019-10-10 NOTE — Telephone Encounter (Signed)
Call returned to patient and left non specific mag on voice mail that 1 prescription was sent and to be sure that she sees Dr Lucky Cowboy

## 2019-10-10 NOTE — Progress Notes (Signed)
Date:  10/10/2019   Name:  Kristina Wright   DOB:  Nov 03, 1955   MRN:  [id number]   Chief Complaint: Follow-up (2 weeks follow up- needing leg cream refilled- mupirocin)  HPI  Lab Results  Component Value Date   CREATININE 0.90 10/07/2019   BUN 27 (H) 10/07/2019   NA 138 10/07/2019   K 4.1 10/07/2019   CL 100 10/07/2019   CO2 28 10/07/2019   Lab Results  Component Value Date   CHOL 150 08/02/2019   HDL 45 08/02/2019   LDLCALC 84 08/02/2019   TRIG 119 08/02/2019   CHOLHDL 3.5 03/03/2019   Lab Results  Component Value Date   TSH 3.341 06/20/2019   No results found for: HGBA1C Lab Results  Component Value Date   WBC 14.9 (H) 10/07/2019   HGB 11.4 (L) 10/07/2019   HCT 34.2 (L) 10/07/2019   MCV 91.9 10/07/2019   PLT 333 10/07/2019   Lab Results  Component Value Date   ALT 25 10/07/2019   AST 18 10/07/2019   ALKPHOS 68 10/07/2019   BILITOT 0.5 10/07/2019     Review of Systems  Patient Active Problem List   Diagnosis Date Noted  . Bilateral lower extremity edema 10/07/2019  . Herpes zoster without complication 24/12/7351  . Port-A-Cath in place 10/01/2019  . Brain metastasis ([place]) 08/05/2019  . Elevated TSH 06/20/2019  . Chronic obstructive pulmonary disease ([place]) 12/13/2018  . Left lower lobe pulmonary nodule 11/29/2018  . Breast nodule 08/25/2018  . Anxiety 08/22/2018  . Genetic testing 06/11/2018  . Family history of breast cancer   . Family history of colon cancer   . Family history of ovarian cancer   . Family history of stomach cancer   . Family history of throat cancer   . Metastatic adenocarcinoma to lung with unknown primary site [facility]) 04/27/2018  . Depression 03/12/2018  . Malignant neoplasm of lung ([place]) 03/01/2018  . Goals of care, counseling/discussion 03/01/2018  . Tobacco use disorder 02/08/2016  . Essential hypertension 02/08/2016  . PVD (peripheral vascular disease) ([place]) 02/08/2016  . Blue toe syndrome of right lower extremity ([place])  02/08/2016  . Adult BMI > 30 07/31/2015  . High risk medication use 07/31/2015  . Carpal tunnel syndrome on both sides 10/30/2014  . Postmenopause atrophic vaginitis 05/15/2014  . Sensory urge incontinence 05/15/2014  . Vitamin D deficiency 04/25/2014  . Extremity pain 11/22/2013  . Numbness 11/22/2013  . Sleep disorder 11/22/2013  . Chronic insomnia 11/10/2013  . Foot pain, left 11/10/2013  . Hypothyroidism 11/10/2013  . Mixed hyperlipidemia 11/10/2013    No Known Allergies  Past Surgical History:  Procedure Laterality Date  . BILATERAL CARPAL TUNNEL RELEASE    . BREAST CYST ASPIRATION Left   . LUNG BIOPSY    . PARTIAL HYSTERECTOMY    . PORTA CATH INSERTION N/A 03/03/2018   Procedure: PORTA CATH INSERTION;  Surgeon: Katha Cabal, MD;  Location: [place] [facility];  Service: Cardiovascular;  Laterality: N/A;  . TUBAL LIGATION      Social History   Tobacco Use  . Smoking status: Former Smoker    Packs/day: 1.00    Years: 25.00    Pack years: 25.00    Types: Cigarettes    Quit date: 11/19/2017    Years since quitting: 1.8  . Smokeless tobacco: Never Used  Vaping Use  . Vaping Use: Never used  Substance Use Topics  . Alcohol use: No  . Drug  use: Yes    Types: Marijuana    Comment: occasional     Medication list has been reviewed and updated.  Current Meds  Medication Sig  . ALPRAZolam (XANAX) 0.25 MG tablet Take 1 tablet (0.25 mg total) by mouth at bedtime as needed. for anxiety AS NEEDED ONLY!  Marland Kitchen aspirin EC 81 MG tablet Take 81 mg by mouth daily.  Marland Kitchen atorvastatin (LIPITOR) 10 MG tablet Take 1 tablet (10 mg total) by mouth daily.  . busPIRone (BUSPAR) 5 MG tablet Take 1 tablet (5 mg total) by mouth daily.  . chlorhexidine (PERIDEX) 0.12 % solution Place 15-15ml of solution in warm water and soak fingers QID.  Marland Kitchen clopidogrel (PLAVIX) 75 MG tablet Take 1 tablet (75 mg total) by mouth daily.  Marland Kitchen dexamethasone (DECADRON) 1 MG tablet Take 3 tablets (3 mg total)  by mouth 2 (two) times daily with a meal.  . dexamethasone (DECADRON) 4 MG tablet Take 1 tablet (4 mg total) by mouth 2 (two) times daily.  Marland Kitchen FLUoxetine (PROZAC) 10 MG capsule Take 1 capsule (10 mg total) by mouth daily.  . furosemide (LASIX) 20 MG tablet Take 1 tablet (20 mg total) by mouth daily.  . hydrOXYzine (ATARAX/VISTARIL) 10 MG tablet Take 1 tablet (10 mg total) by mouth daily. (Patient taking differently: Take 10 mg by mouth at bedtime. )  . levETIRAcetam (KEPPRA) 500 MG tablet Take 1 tablet (500 mg total) by mouth 2 (two) times daily.  Marland Kitchen levothyroxine (SYNTHROID) 112 MCG tablet Take 1 tablet (112 mcg total) by mouth daily.  Marland Kitchen lidocaine (XYLOCAINE) 2 % solution Use as directed 15 mLs in the mouth or throat as needed for mouth pain.  Marland Kitchen lidocaine-prilocaine (EMLA) cream Apply 1 application topically as needed.  Marland Kitchen lisinopril-hydrochlorothiazide (ZESTORETIC) 20-25 MG tablet Take 1 tablet by mouth daily.  . Multiple Vitamins-Minerals (MULTIVITAMIN ADULT PO) Take 1 tablet by mouth daily.   . mupirocin ointment (BACTROBAN) 2 % Apply 1 application topically 2 (two) times daily.  Marland Kitchen nystatin cream (MYCOSTATIN) Apply 1 application topically 2 (two) times daily.  . Olopatadine HCl 0.2 % SOLN Apply 1 drop to eye in the morning and at bedtime.  Marland Kitchen omeprazole (PRILOSEC) 20 MG capsule Take 1 capsule (20 mg total) by mouth daily.  Marland Kitchen osimertinib mesylate (TAGRISSO) 80 MG tablet Take 1 tablet (80 mg total) by mouth daily.  Marland Kitchen oxyCODONE (OXY IR/ROXICODONE) 5 MG immediate release tablet Take 1 tablet (5 mg total) by mouth every 6 (six) hours as needed for severe pain.  Marland Kitchen umeclidinium-vilanterol (ANORO ELLIPTA) 62.5-25 MCG/INH AEPB Inhale 1 puff into the lungs daily.  . valACYclovir (VALTREX) 1000 MG tablet Take 1 tablet (1,000 mg total) by mouth 3 (three) times daily.  . VENTOLIN HFA 108 (90 Base) MCG/ACT inhaler Inhale 1-2 puffs into the lungs every 6 (six) hours as needed for wheezing or shortness of breath.     PHQ 2/9 Scores 08/01/2019 03/03/2019 12/13/2018 01/07/2018  PHQ - 2 Score 2 0 0 1  PHQ- 9 Score 4 1 1 6     GAD 7 : Generalized Anxiety Score 08/01/2019 03/03/2019 08/16/2018 04/27/2018  Nervous, Anxious, on Edge 2 1 1 2   Control/stop worrying 2 1 1 2   Worry too much - different things 2 2 2 2   Trouble relaxing 0 0 2 2  Restless 0 0 0 0  Easily annoyed or irritable 0 0 0 0  Afraid - awful might happen 0 0 0 1  Total GAD 7 Score  6 4 6 9   Anxiety Difficulty Somewhat difficult Somewhat difficult Not difficult at all Not difficult at all    BP Readings from Last 3 Encounters:  10/10/19 120/80  10/07/19 (!) 153/90  10/04/19 127/72    Physical Exam  Wt Readings from Last 3 Encounters:  10/10/19 227 lb (103 kg)  10/07/19 227 lb 8 oz (103.2 kg)  09/30/19 227 lb 4.8 oz (103.1 kg)    BP 120/80   Pulse 84   Ht 5\' 9"  (1.753 m)   Wt 227 lb (103 kg)   BMI 33.52 kg/m   Assessment and Plan: 1. Lymphedema of both lower extremities Chronic.  Persistent.  Weight remains the same.  I suspect this is sufficiently due to dependent edema patient is unable to allow elevation of feet because she needs to be at work at [employer].  Review of last April's CT of the abdomen and pelvis showed no adenopathy which may be contributing to this.  However this may be an issue that needs to be addressed in the future.  In the meantime we will consult pain and vascular as to what other avenues that we may have been given some of the swelling which is causing not only discomfort, swelling, but also draining from ulcerated areas.  Patient has been encouraged to elevate legs since she is not been able to procure any stockings. - furosemide (LASIX) 20 MG tablet; Take 1 tablet (20 mg total) by mouth 2 (two) times daily.  Dispense: 60 tablet; Refill: 3 - Ambulatory referral to Vascular Surgery  2. Cellulitis of left lower extremity New onset.  Persistent.  There is an area of redness over the forefoot of the left foot.   We will initiate Augmentin 875 twice a day.  Patient has been given Bactroban to use over the areas of folliculitis. - amoxicillin-clavulanate (AUGMENTIN) 875-125 MG tablet; Take 1 tablet by mouth 2 (two) times daily.  Dispense: 20 tablet; Refill: 0 - AMB referral to wound care center  3. Folliculitis We will refill Bactroban in the meantime we are going to refer to wound care clinic as to given [place] a daily regimen that we can use to help with these open areas due to leg swelling/lymphedema. - mupirocin ointment (BACTROBAN) 2 %; Apply 1 application topically 2 (two) times daily.  Dispense: 22 g; Refill: 0 - AMB referral to wound care center

## 2019-10-10 NOTE — Telephone Encounter (Addendum)
Dr. Tasia Catchings says OK to fill the Nystatin cream (rx sent).  The others should no longer be needed.  Does she have a opened wounds or possible infection that requires the Bactroban cream?  Also, remind patient Dr. Tasia Catchings recommends she f/u with Dr. Lucky Cowboy for bilateral lower extremity vein insufficiency

## 2019-10-10 NOTE — Telephone Encounter (Signed)
Spoke to [employer]- she was only requesting skilled nursing and PT due to multiple falls and places on butt

## 2019-10-10 NOTE — Telephone Encounter (Signed)
Patient called and states that she was to have some creams called in Friday and they did not get sent. Please advise

## 2019-10-10 NOTE — Telephone Encounter (Signed)
Called patient to verify what creams she is needing filled:  Nystatin, Bactroban, and Peridex solution.

## 2019-10-11 ENCOUNTER — Ambulatory Visit (INDEPENDENT_AMBULATORY_CARE_PROVIDER_SITE_OTHER): Payer: Self-pay | Admitting: Nurse Practitioner

## 2019-10-11 ENCOUNTER — Encounter (INDEPENDENT_AMBULATORY_CARE_PROVIDER_SITE_OTHER): Payer: Self-pay | Admitting: Nurse Practitioner

## 2019-10-11 ENCOUNTER — Telehealth (INDEPENDENT_AMBULATORY_CARE_PROVIDER_SITE_OTHER): Payer: Self-pay

## 2019-10-11 VITALS — BP 131/72 | HR 102 | Ht 69.0 in | Wt 227.0 lb

## 2019-10-11 DIAGNOSIS — R600 Localized edema: Secondary | ICD-10-CM

## 2019-10-11 DIAGNOSIS — I75021 Atheroembolism of right lower extremity: Secondary | ICD-10-CM

## 2019-10-11 DIAGNOSIS — I10 Essential (primary) hypertension: Secondary | ICD-10-CM

## 2019-10-11 NOTE — Progress Notes (Signed)
Subjective:    Patient ID: Kristina Wright, female    DOB: 1955-08-30, 64 y.o.   MRN: [id number] Chief Complaint  Patient presents with  . Follow-up    add on per phone note    Patient presents with depression from from her primary care physician Dr. Ronnald Ramp.  Patient recently had brain surgery due to cancer and noted that she started to have severe lower extremity edema.  Because of this edema the patient began to weep as well as have lower extremity ulcerations.  Also of great concern is the patient's known PAD.  Previous study showed the patient had moderate peripheral arterial disease.  The patient also has evidence of cellulitis today however she was recently given antibiotics by her primary care physician.  Patient notes that her leg fluid excessively they are sore and painful.  She denies any claudication-like symptoms.  She denies any rest pain like symptoms.  She denies any gangrenous symptoms.  Denies any fever, chills, nausea, vomiting or diarrhea.  Patient is extremely anxious about the possibility that this infection could lead to amputation however at this time there are no limb threatening symptoms present.  Patient declined noninvasive studies today due to being extremely fatigued.   Review of Systems  Cardiovascular: Positive for leg swelling.  Skin: Positive for color change and wound.  Psychiatric/Behavioral: The patient is nervous/anxious.        Objective:   Physical Exam Vitals reviewed.  HENT:     Head: Normocephalic.  Musculoskeletal:     Right lower leg: 3+ Edema present.     Left lower leg: 3+ Edema present.  Skin:    General: Skin is moist.     Findings: Wound present.     Comments: Patient has multiple ulcerations on her bilateral feet some with purulent drainage  Neurological:     Mental Status: She is alert and oriented to person, place, and time.  Psychiatric:        Mood and Affect: Mood is anxious.        Behavior: Behavior normal.        Thought  Content: Thought content normal.        Judgment: Judgment normal.     BP 131/72   Pulse (!) 102   Ht 5\' 9"  (1.753 m)   Wt 227 lb (103 kg)   BMI 33.52 kg/m   Past Medical History:  Diagnosis Date  . Cancer ([place]) 1989   cervical  . Depression   . Family history of breast cancer   . Family history of colon cancer   . Family history of ovarian cancer   . Family history of stomach cancer   . Family history of throat cancer   . Hyperlipidemia   . Hypertension   . Malignant neoplasm of lung ([place]) 03/01/2018  . Thyroid disease     Social History   Socioeconomic History  . Marital status: Single    Spouse name: Not on file  . Number of children: 3  . Years of education: Not on file  . Highest education level: Not on file  Occupational History  . Not on file  Tobacco Use  . Smoking status: Former Smoker    Packs/day: 1.00    Years: 25.00    Pack years: 25.00    Types: Cigarettes    Quit date: 11/19/2017    Years since quitting: 1.8  . Smokeless tobacco: Never Used  Vaping Use  . Vaping Use: Never used  Substance and Sexual Activity  . Alcohol use: No  . Drug use: Yes    Types: Marijuana    Comment: occasional  . Sexual activity: Not on file  Other Topics Concern  . Not on file  Social History Narrative  . Not on file   Social Determinants of Health   Financial Resource Strain:   . Difficulty of Paying Living Expenses:   Food Insecurity:   . Worried About Charity fundraiser in the Last Year:   . Arboriculturist in the Last Year:   Transportation Needs:   . Film/video editor (Medical):   Marland Kitchen Lack of Transportation (Non-Medical):   Physical Activity:   . Days of Exercise per Week:   . Minutes of Exercise per Session:   Stress:   . Feeling of Stress :   Social Connections:   . Frequency of Communication with Friends and Family:   . Frequency of Social Gatherings with Friends and Family:   . Attends Religious Services:   . Active Member of Clubs or  Organizations:   . Attends Archivist Meetings:   Marland Kitchen Marital Status:   Intimate Partner Violence:   . Fear of Current or Ex-Partner:   . Emotionally Abused:   Marland Kitchen Physically Abused:   . Sexually Abused:     Past Surgical History:  Procedure Laterality Date  . BILATERAL CARPAL TUNNEL RELEASE    . BREAST CYST ASPIRATION Left   . LUNG BIOPSY    . PARTIAL HYSTERECTOMY    . PORTA CATH INSERTION N/A 03/03/2018   Procedure: PORTA CATH INSERTION;  Surgeon: Katha Cabal, MD;  Location: [facility];  Service: Cardiovascular;  Laterality: N/A;  . TUBAL LIGATION      Family History  Problem Relation Age of Onset  . Colon cancer Mother 20  . Colon cancer Maternal Grandmother 78  . Hypertension Father   . Breast cancer Sister 34  . Throat cancer Brother   . Heart attack Maternal Aunt   . Ovarian cancer Maternal Aunt 70  . Stomach cancer Paternal Grandmother        dx >50  . Throat cancer Brother     No Known Allergies     Assessment & Plan:   1. Bilateral lower extremity edema No surgery or intervention at this point in time.    I have had a long discussion with the patient regarding venous insufficiency and why it  causes symptoms, specifically venous ulceration . I have discussed with the patient the chronic skin changes that accompany venous insufficiency and the long term sequela such as infection and recurring  ulceration.  Patient will be placed in [employer] which will be changed weekly drainage permitting.  Because the patient is weeping profusely, we will instruct home health to start changing her wraps biweekly to ensure she does not have wet wraps in place.  In addition, behavioral modification including several periods of elevation of the lower extremities during the day will be continued. Achieving a position with the ankles at heart level was stressed to the patient  The patient is instructed to begin routine exercise, especially walking on a daily  basis  We will have the patient return to the office in 4 weeks to reeval lower extremity edema.  2. Essential hypertension Continue antihypertensive medications as already ordered, these medications have been reviewed and there are no changes at this time.   3. Blue toe syndrome of right lower  extremity ([place]) Previously the patient was seen for blue toe syndrome in 2019.  The patient had acceptable ABIs at that time in the near normal range.  I discussed doing ABIs with the patient today however it was too painful and the patient was worn out and so she did not wish to proceed today.  However, we will have ABIs done when the patient returns for her the right follow-up.  The patient currently does have discoloration of her toes however with the excessive edema and known microvascular disease that is the likely source.     Current Outpatient Medications on File Prior to Visit  Medication Sig Dispense Refill  . ALPRAZolam (XANAX) 0.25 MG tablet Take 1 tablet (0.25 mg total) by mouth at bedtime as needed. for anxiety AS NEEDED ONLY! 30 tablet 0  . amoxicillin-clavulanate (AUGMENTIN) 875-125 MG tablet Take 1 tablet by mouth 2 (two) times daily. 20 tablet 0  . aspirin EC 81 MG tablet Take 81 mg by mouth daily.    Marland Kitchen atorvastatin (LIPITOR) 10 MG tablet Take 1 tablet (10 mg total) by mouth daily. 90 tablet 1  . busPIRone (BUSPAR) 5 MG tablet Take 1 tablet (5 mg total) by mouth daily. 90 tablet 1  . chlorhexidine (PERIDEX) 0.12 % solution Place 15-26ml of solution in warm water and soak fingers QID. 473 mL 0  . clopidogrel (PLAVIX) 75 MG tablet Take 1 tablet (75 mg total) by mouth daily. 90 tablet 0  . dexamethasone (DECADRON) 1 MG tablet Take 3 tablets (3 mg total) by mouth 2 (two) times daily with a meal. 42 tablet 0  . dexamethasone (DECADRON) 4 MG tablet Take 1 tablet (4 mg total) by mouth 2 (two) times daily. 28 tablet 1  . FLUoxetine (PROZAC) 10 MG capsule Take 1 capsule (10 mg total) by mouth  daily. 90 capsule 1  . furosemide (LASIX) 20 MG tablet Take 1 tablet (20 mg total) by mouth 2 (two) times daily. 60 tablet 3  . hydrOXYzine (ATARAX/VISTARIL) 10 MG tablet Take 1 tablet (10 mg total) by mouth daily. (Patient taking differently: Take 10 mg by mouth at bedtime. ) 30 tablet 5  . levETIRAcetam (KEPPRA) 500 MG tablet Take 1 tablet (500 mg total) by mouth 2 (two) times daily. 60 tablet 2  . levothyroxine (SYNTHROID) 112 MCG tablet Take 1 tablet (112 mcg total) by mouth daily. 90 tablet 1  . lidocaine (XYLOCAINE) 2 % solution Use as directed 15 mLs in the mouth or throat as needed for mouth pain. 100 mL 0  . lidocaine-prilocaine (EMLA) cream Apply 1 application topically as needed.    Marland Kitchen lisinopril-hydrochlorothiazide (ZESTORETIC) 20-25 MG tablet Take 1 tablet by mouth daily. 90 tablet 0  . Multiple Vitamins-Minerals (MULTIVITAMIN ADULT PO) Take 1 tablet by mouth daily.     . mupirocin ointment (BACTROBAN) 2 % Apply 1 application topically 2 (two) times daily. 22 g 0  . nystatin cream (MYCOSTATIN) Apply 1 application topically 2 (two) times daily. 30 g 0  . Olopatadine HCl 0.2 % SOLN Apply 1 drop to eye in the morning and at bedtime. 2.5 mL 1  . omeprazole (PRILOSEC) 20 MG capsule Take 1 capsule (20 mg total) by mouth daily. 30 capsule 2  . osimertinib mesylate (TAGRISSO) 80 MG tablet Take 1 tablet (80 mg total) by mouth daily. 30 tablet 2  . oxyCODONE (OXY IR/ROXICODONE) 5 MG immediate release tablet Take 1 tablet (5 mg total) by mouth every 6 (six) hours as needed  for severe pain. 30 tablet 0  . umeclidinium-vilanterol (ANORO ELLIPTA) 62.5-25 MCG/INH AEPB Inhale 1 puff into the lungs daily. 60 each 5  . valACYclovir (VALTREX) 1000 MG tablet Take 1 tablet (1,000 mg total) by mouth 3 (three) times daily. 21 tablet 1  . VENTOLIN HFA 108 (90 Base) MCG/ACT inhaler Inhale 1-2 puffs into the lungs every 6 (six) hours as needed for wheezing or shortness of breath. 18 g 1  . [DISCONTINUED]  prochlorperazine (COMPAZINE) 10 MG tablet Take 1 tablet (10 mg total) by mouth every 6 (six) hours as needed (Nausea or vomiting). (Patient not taking: Reported on 03/03/2019) 30 tablet 1   No current facility-administered medications on file prior to visit.    There are no Patient Instructions on file for this visit. No follow-ups on file.   Kris Hartmann, NP

## 2019-10-11 NOTE — Telephone Encounter (Addendum)
Verbal orders has being receive by Kristina Wright from [facility] to start  bilateral unna wrap change twice a week for four weeks.

## 2019-10-13 ENCOUNTER — Telehealth (INDEPENDENT_AMBULATORY_CARE_PROVIDER_SITE_OTHER): Payer: Self-pay

## 2019-10-13 ENCOUNTER — Telehealth: Payer: Self-pay | Admitting: *Deleted

## 2019-10-13 NOTE — Telephone Encounter (Signed)
We received a letter of denial on 6/18 and Heather contacted [facility] (insurance) regarding this, but when pt came to office last week she reported that [facility] was coming to her house.  Looks like HH was requested as well by [facility] for Chiropractor. I think the letter is more for services requested by another office.

## 2019-10-13 NOTE — Telephone Encounter (Signed)
Patient called reporting that she received a letter of denial for Home Health services due to her not being hospice and that [facility] asked for 60 visits. She states that she cannot go out to get her dressings done. And that the contract she signed was for 10 visits. I advised her to call [facility] as they would have been the one to do a prior authorization and she has agreed to call them

## 2019-10-13 NOTE — Telephone Encounter (Signed)
Patient left a voicemail stating that her home health nurse Olin Hauser was approved to come to her home today to change her unna wrap.

## 2019-10-13 NOTE — Telephone Encounter (Signed)
Patient left a voicemail stating that she received a denial letter from her insurance for home health. I left a message on the patient voicemail informing that she will need to call the office to schedule appointment to come in for nurse visit for unna wraps.

## 2019-10-13 NOTE — Telephone Encounter (Signed)
[facility] nurse Olin Hauser left a voicemail informing that all the patient home health visits has been authorized

## 2019-10-14 ENCOUNTER — Ambulatory Visit (INDEPENDENT_AMBULATORY_CARE_PROVIDER_SITE_OTHER): Payer: Self-pay | Admitting: Nurse Practitioner

## 2019-10-14 ENCOUNTER — Telehealth (INDEPENDENT_AMBULATORY_CARE_PROVIDER_SITE_OTHER): Payer: Self-pay

## 2019-10-14 ENCOUNTER — Other Ambulatory Visit: Payer: Self-pay

## 2019-10-14 ENCOUNTER — Encounter (INDEPENDENT_AMBULATORY_CARE_PROVIDER_SITE_OTHER): Payer: Self-pay

## 2019-10-14 VITALS — BP 196/76 | HR 106 | Resp 16 | Wt 232.0 lb

## 2019-10-14 DIAGNOSIS — L97909 Non-pressure chronic ulcer of unspecified part of unspecified lower leg with unspecified severity: Secondary | ICD-10-CM

## 2019-10-14 DIAGNOSIS — I83009 Varicose veins of unspecified lower extremity with ulcer of unspecified site: Secondary | ICD-10-CM

## 2019-10-14 NOTE — Progress Notes (Signed)
History of Present Illness  There is no documented history at this time  Assessments & Plan   There are no diagnoses linked to this encounter.    Additional instructions  Subjective:  Patient presents with venous ulcer of the Bilateral lower extremity.    Procedure:  3 layer unna wrap was placed Bilateral lower extremity.   Plan:   Follow up in one week.  

## 2019-10-14 NOTE — Telephone Encounter (Signed)
Olin Hauser from [facility] called requesting orders to apply calcium alginate with unna wraps to help with weeping. Per Eulogio Ditch NP that is fine to start. Home health nurse has been made aware with medical advice

## 2019-10-16 ENCOUNTER — Encounter (INDEPENDENT_AMBULATORY_CARE_PROVIDER_SITE_OTHER): Payer: Self-pay | Admitting: Nurse Practitioner

## 2019-10-17 ENCOUNTER — Other Ambulatory Visit: Payer: Self-pay

## 2019-10-17 ENCOUNTER — Ambulatory Visit
Admission: RE | Admit: 2019-10-17 | Discharge: 2019-10-17 | Disposition: A | Discharge status: Home / Self Care | Payer: 59 | Source: Ambulatory Visit | Attending: Radiation Oncology | Admitting: Radiation Oncology

## 2019-10-17 VITALS — BP 136/71 | HR 97 | Resp 16

## 2019-10-17 DIAGNOSIS — Z923 Personal history of irradiation: Secondary | ICD-10-CM | POA: Insufficient documentation

## 2019-10-17 DIAGNOSIS — C3432 Malignant neoplasm of lower lobe, left bronchus or lung: Secondary | ICD-10-CM | POA: Insufficient documentation

## 2019-10-17 DIAGNOSIS — C7931 Secondary malignant neoplasm of brain: Secondary | ICD-10-CM

## 2019-10-17 DIAGNOSIS — R609 Edema, unspecified: Secondary | ICD-10-CM | POA: Diagnosis not present

## 2019-10-17 NOTE — Progress Notes (Signed)
Radiation Oncology Follow up Note  Name: Kristina Wright   Date:   10/17/2019 MRN:  [id number] DOB: 02-11-1956    This 64 y.o. female presents to the clinic today for 1 month follow-up status post SRS for type solitary brain metastasis and patient with known stage IV adenocarcinoma the lung.  REFERRING PROVIDER: Juline Patch, MD  HPI: Patient is a 64 year old female now at 1 month having completed SRS to a solitary brain metastasis from known stage IV adenocarcinoma the lung..  She is seen seen today in routine follow-up she is doing fair.  She is currently on osimertinib 80 mg daily and tolerating that well.  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  She is currently on steroids.  She is having no focal neurologic deficits specifically denies any change in visual fields.  She does have bilateral lateral lower extremity edema most likely related to the vein insufficiency has compression stockings on and is being followed by vascular surgery.  She is also currently on Lasix.  COMPLICATIONS OF TREATMENT: none  FOLLOW UP COMPLIANCE: keeps appointments   PHYSICAL EXAM:  BP 136/71 (BP Location: Left Arm, Patient Position: Sitting)   Pulse 97   Resp 74  Wheelchair-bound female in NAD crude visual fields within normal range.  No focal neurologic deficits are identified.  Her lower extremities are wrapped she states they are still oozing and is under symptom management for that.  Well-developed well-nourished patient in NAD. HEENT reveals PERLA, EOMI, discs not visualized.  Oral cavity is clear. No oral mucosal lesions are identified. Neck is clear without evidence of cervical or supraclavicular adenopathy. Lungs are clear to A&P. Cardiac examination is essentially unremarkable with regular rate and rhythm without murmur rub or  thrill. Abdomen is benign with no organomegaly or masses noted. Motor sensory and DTR levels are equal and symmetric in the upper and lower extremities. Cranial nerves II through XII are grossly intact. Proprioception is intact. No peripheral adenopathy or edema is identified. No motor or sensory levels are noted. Crude visual fields are within normal range.  RADIOLOGY RESULTS: Post SRS MRI scan of brain reviewed compatible with above-stated findings  PLAN: Present time patient is currently on immunotherapy for her stage IV lung cancer.  She continues on steroids under Dr. Collie Siad direction.  I have asked to see her back in 3 to 4 months for follow-up.  We have to reevaluate the patient anytime should further consultation be indicated.  I would like to take this opportunity to thank you for allowing me to participate in the care of your patient.Noreene Filbert, MD

## 2019-10-18 ENCOUNTER — Ambulatory Visit: Payer: 59 | Admitting: Hematology and Oncology

## 2019-10-18 ENCOUNTER — Encounter (INDEPENDENT_AMBULATORY_CARE_PROVIDER_SITE_OTHER): Payer: Medicaid Other

## 2019-10-18 ENCOUNTER — Telehealth: Payer: Self-pay | Admitting: Family Medicine

## 2019-10-18 ENCOUNTER — Encounter: Payer: 59 | Admitting: Hospice and Palliative Medicine

## 2019-10-18 NOTE — Telephone Encounter (Signed)
I called and left a message on Kristina Wright's voicemail stating that [facility]/ [facility] are the ones that have set up home health for Kristina Wright- also, they can contact oncology Dr Tasia Catchings, if further orders are needed from a different doctor involved in her care. After seeing Dr Ronnald Ramp, she turned her over to vein and vascular for further assistance with edema/ vascular concerns

## 2019-10-18 NOTE — Telephone Encounter (Addendum)
Olin Hauser with [facility] would like to know from Dr Ronnald Ramp is it ok to take orders concerning the pt from Eulogio Ditch who is a NP.  CB  [phone]

## 2019-10-18 NOTE — Telephone Encounter (Signed)
Merry Proud with [facility] calling for verbal PT orders  1 wk 6  cb [phone]

## 2019-10-18 NOTE — Telephone Encounter (Signed)
Called Kristina Wright and gave the ok to take orders from [facility], as she is the one with [facility] that has ordered [facility]

## 2019-10-19 ENCOUNTER — Other Ambulatory Visit: Payer: Self-pay | Admitting: *Deleted

## 2019-10-19 ENCOUNTER — Telehealth: Payer: Self-pay | Admitting: *Deleted

## 2019-10-19 ENCOUNTER — Other Ambulatory Visit: Payer: Self-pay

## 2019-10-19 ENCOUNTER — Telehealth: Payer: Self-pay | Admitting: Family Medicine

## 2019-10-19 DIAGNOSIS — C3490 Malignant neoplasm of unspecified part of unspecified bronchus or lung: Secondary | ICD-10-CM

## 2019-10-19 MED ORDER — OXYCODONE HCL 5 MG PO TABS: 5.0000 mg | ORAL_TABLET | Freq: Four times a day (QID) | ORAL | 0 refills | Status: DC | PRN

## 2019-10-19 NOTE — Telephone Encounter (Signed)
Home Health Verbal Orders - Caller/Agency: [facility] Callback Number: [phone] option 2 secure line Requesting OT/PT/Skilled Nursing/Social Work/Speech Therapy: PT Frequency: 1x for 6wks

## 2019-10-19 NOTE — Telephone Encounter (Signed)
Spoke to [place] who stated she would need the "ok" for them to take orders from Dr Tasia Catchings- gave the ok to do that

## 2019-10-19 NOTE — Telephone Encounter (Signed)
Patient called reporting that she is having pain in her feet at 10/10 on pain scale for the past 3 days and is asking for refill of her Oxycodone

## 2019-10-19 NOTE — Telephone Encounter (Signed)
Kristina Wright with [facility] called to see if Dr Tasia Catchings would sign orders for Home PT services 1 wk 6. Dr Ronnald Ramp, her PCP deferred this for Dr Tasia Catchings feeling her problems stem form her treatment received at [facility]. Please advise

## 2019-10-19 NOTE — Telephone Encounter (Signed)
Reviewed PDMP.  Appropriate for refill.  RX oxycodone 5 mg every 6 hours as needed for pain. (# 30 tab).   [name], NP 10/19/2019 12:22 PM

## 2019-10-20 ENCOUNTER — Inpatient Hospital Stay: Payer: 59 | Attending: Oncology

## 2019-10-20 ENCOUNTER — Other Ambulatory Visit: Payer: Self-pay

## 2019-10-20 ENCOUNTER — Telehealth (INDEPENDENT_AMBULATORY_CARE_PROVIDER_SITE_OTHER): Payer: Self-pay

## 2019-10-20 ENCOUNTER — Encounter: Payer: Self-pay | Admitting: Oncology

## 2019-10-20 ENCOUNTER — Inpatient Hospital Stay (HOSPITAL_BASED_OUTPATIENT_CLINIC_OR_DEPARTMENT_OTHER): Payer: 59 | Admitting: Oncology

## 2019-10-20 VITALS — BP 147/84 | HR 114 | Temp 96.6°F | Resp 18 | Wt 227.3 lb

## 2019-10-20 DIAGNOSIS — C3490 Malignant neoplasm of unspecified part of unspecified bronchus or lung: Secondary | ICD-10-CM

## 2019-10-20 DIAGNOSIS — M79604 Pain in right leg: Secondary | ICD-10-CM | POA: Diagnosis not present

## 2019-10-20 DIAGNOSIS — R739 Hyperglycemia, unspecified: Secondary | ICD-10-CM | POA: Insufficient documentation

## 2019-10-20 DIAGNOSIS — R200 Anesthesia of skin: Secondary | ICD-10-CM | POA: Insufficient documentation

## 2019-10-20 DIAGNOSIS — C7931 Secondary malignant neoplasm of brain: Secondary | ICD-10-CM | POA: Diagnosis not present

## 2019-10-20 DIAGNOSIS — R600 Localized edema: Secondary | ICD-10-CM | POA: Diagnosis not present

## 2019-10-20 DIAGNOSIS — E871 Hypo-osmolality and hyponatremia: Secondary | ICD-10-CM | POA: Insufficient documentation

## 2019-10-20 DIAGNOSIS — M79605 Pain in left leg: Secondary | ICD-10-CM | POA: Insufficient documentation

## 2019-10-20 LAB — CBC WITH DIFFERENTIAL/PLATELET
Abs Immature Granulocytes: 0.25 10*3/uL — ABNORMAL HIGH (ref 0.00–0.07)
Basophils Absolute: 0 10*3/uL (ref 0.0–0.1)
Basophils Relative: 0 %
Eosinophils Absolute: 0 10*3/uL (ref 0.0–0.5)
Eosinophils Relative: 0 %
HCT: 37.7 % (ref 36.0–46.0)
Hemoglobin: 12.8 g/dL (ref 12.0–15.0)
Immature Granulocytes: 1 %
Lymphocytes Relative: 8 %
Lymphs Abs: 1.6 10*3/uL (ref 0.7–4.0)
MCH: 30.8 pg (ref 26.0–34.0)
MCHC: 34 g/dL (ref 30.0–36.0)
MCV: 90.6 fL (ref 80.0–100.0)
Monocytes Absolute: 0.6 10*3/uL (ref 0.1–1.0)
Monocytes Relative: 3 %
Neutro Abs: 18.3 10*3/uL — ABNORMAL HIGH (ref 1.7–7.7)
Neutrophils Relative %: 88 %
Platelets: 268 10*3/uL (ref 150–400)
RBC: 4.16 MIL/uL (ref 3.87–5.11)
RDW: 15.6 % — ABNORMAL HIGH (ref 11.5–15.5)
WBC: 20.8 10*3/uL — ABNORMAL HIGH (ref 4.0–10.5)
nRBC: 0 % (ref 0.0–0.2)

## 2019-10-20 LAB — COMPREHENSIVE METABOLIC PANEL
ALT: 29 U/L (ref 0–44)
AST: 17 U/L (ref 15–41)
Albumin: 3.3 g/dL — ABNORMAL LOW (ref 3.5–5.0)
Alkaline Phosphatase: 70 U/L (ref 38–126)
Anion gap: 12 (ref 5–15)
BUN: 35 mg/dL — ABNORMAL HIGH (ref 8–23)
CO2: 23 mmol/L (ref 22–32)
Calcium: 8.6 mg/dL — ABNORMAL LOW (ref 8.9–10.3)
Chloride: 94 mmol/L — ABNORMAL LOW (ref 98–111)
Creatinine, Ser: 1.09 mg/dL — ABNORMAL HIGH (ref 0.44–1.00)
GFR calc Af Amer: >60 mL/min (ref >60)
GFR calc non Af Amer: 55 mL/min — ABNORMAL LOW (ref >60)
Glucose, Bld: 208 mg/dL — ABNORMAL HIGH (ref 70–99)
Potassium: 4 mmol/L (ref 3.5–5.1)
Sodium: 129 mmol/L — ABNORMAL LOW (ref 135–145)
Total Bilirubin: 0.6 mg/dL (ref 0.3–1.2)
Total Protein: 6.9 g/dL (ref 6.5–8.1)

## 2019-10-20 MED ORDER — DEXAMETHASONE 1 MG PO TABS: 2.0000 mg | ORAL_TABLET | ORAL | 0 refills | Status: DC

## 2019-10-20 MED ORDER — GABAPENTIN 300 MG PO CAPS
300.0000 mg | ORAL_CAPSULE | Freq: Two times a day (BID) | ORAL | 1 refills | Status: DC
Start: 2019-10-20 — End: 2019-10-30

## 2019-10-20 NOTE — Progress Notes (Signed)
Patient reports her feet/leg pain is getting worse with pain 15/10 today (took Oxycodone at 8 am) and pain med is not helping.

## 2019-10-20 NOTE — Telephone Encounter (Signed)
VERBAL ORDER called to [facility] per verbal order Dr Tasia Catchings per secure chat

## 2019-10-20 NOTE — Telephone Encounter (Signed)
Kristina Wright K from [facility] called requesting verbal order to use exu-dry with unna wrap for bilateral weeping. The nurse stated on today the legs had being weeping a lot and the bottom her feet were white from the drainage. Per Dr Delana Meyer he is fine with nurse using the requesting orders. Kristina Wright has been made aware with orders were fine to use.

## 2019-10-20 NOTE — Progress Notes (Signed)
Hematology/Oncology follow up note [facility] [phone] [phone]   Patient Care Team: Juline Patch, MD as PCP - General (Family Medicine) Lequita Asal, MD as Referring Physician (Hematology and Oncology) Schnier, Dolores Lory, MD (Vascular Surgery) Noreene Filbert, MD as Radiation Oncologist (Radiation Oncology)  REFERRING PROVIDER: Juline Patch, MD REASON FOR VISIT:  Follow-up on lung cancer management.  HISTORY OF PRESENTING ILLNESS:  Kristina Wright is a  64 y.o.  female with PMH listed below who presents to re-establish care with me for management of Stage IV lung adenocarcinoma.  She has a history of 30-year pack smoking history, former smoker.  Significant family history of cancer.  Her sister follows up with me for breast cancer.  Other family member cancer history listed in the family history section.  Self-report history of cervical cancer in 1989.  INTERVAL HISTORY Kristina Wright is a 64 y.o. female who has above history reviewed by me today present for reestablish care. Patient was last seen by me on 04/08/2018.  At that time she transferred her care to my colleague Dr. Mike Gip.  Patient recently called and prefers to transfer her care back to me.  Extensive medical records review was performed by me.  I also discussed with Dr. Mike Gip via Secure chat. #02/24/2018, right axillary lymph node biopsy showed adenocarcinoma  compatible with lung primary.  Stage IV lung adenocarcinoma  initially cT1N3 M1 + EGFR [id number]. Mutation, PDL-1 60% started osimertinib 03/25/2018.  03/10/2018 brain MRI showed no brain metastasis  #06/17/2018 CT chest showed marked interval reduction of mediastinal and right hilar lymphadenopathy.  Slight increase of left lower lobe pulmonary nodule-the nodule was not hypermetabolic on previous PET scan. #CT 08/23/2018 revealed a stable 10 mm left lower lobe no nodule.  Tree-in-bud nodularity seen in the  medial right upper lobe on the previous study likely improved.  Stable 14 mm left breast nodule. #11/24/2018, chest CT reviewed enlarging left lower lobe pulmonary nodule, 1.5 x 1.4 cm concerning for progressive malignancy. Tissue diagnosis was recommended and patient was referred to see pulmonology at that point.   PET 12/29/2018 showed left lower lobe superior segment nodule has SUV of 8, highly suspicious for carcinoma.  Remainder of the adenocarcinoma appears to be responding well.    Patient declined biopsy.  Patient was referred to establish care with Dr. Baruch Gouty ball 01/31/2019 for consideration of SBRT.  Patient received SBRT to left lower lobe nodule 02/03/2019-02/28/2019, Patient was continued on osimertinib. 04/20/2019 CT chest showed interval reduction of the left lower lobe nodule following stereotactic body radiation treatments.  No new or progressive malignancy. 07/20/2019 CT chest reviewed continued evolutionary changes secondary to radiation involving the lingula and superior segment of the left lower lobe.  No specific findings to suggest residual or recurrent tumor. Patient has headache.  08/03/2019, patient presented to emergency room after developing partial seizure episodes CT head concerning for hyperdense frontal lesion concerning for metastasis. 08/03/2019, MRI brain with and without contrast showed right frontal mass likely reflecting metastasis.  Significant surrounding edema with mild regional mass-effect including subcentimeter leftward midline shift. Patient was seen by neurosurgery while she was in the ER.  Patient was started on Decadron and Keppra. Surgery versus stereotactic radio surgery was discussed.  Patient was advised not to drive. 08/09/2019, restaging CT scan showed chest CT stable since 07/20/2019 without new or progressive findings.  12 mm short axis left hilar node is probably stable since prior study although difficult  access given lack of intravenous contrast on  the earlier exams.  Close attention on follow-up.  No findings to suggest metastatic disease in the abdomen/pelvis.  2.5 cm cystic lesion left ovary.  Not hypermetabolic on previous PET scan  #08/23/2019 MRI brain with and without contrast showed a solitary enhancing mass at the right frontal lobe now measuring 2.8 cm.  Inseparable from overlying dura which appears mildly thickened.  Favoring intra-axial rather than extra-axial metastasis.  Mild regression of vasogenic edema and mass-effect since April 2021 study.  #Patient underwent stereotactic radiation surgery on 09/12/2019. Currently patient has been tapered off dexamethasone.  She continues on Keppra.  Patient has also developed shingles and thrush which has been treated symptomatically.   She was recently seen by nurse practitioner [name].  She also has called reporting concerns of lower extremity swelling and skin concerns. Patient complained about bites on her feet and legs.  Patient was seen by primary care provider Dr. Ronnald Ramp and was given a prescription of nystatin cream for her legs.   Stat MRI of the brain was done on 09/29/2019 which showed interval increasing enhancement at the site of solitary right frontal operculum metastasis now encompassing region of 3.4 cm.  As before the mass is inseparable from the overlying dura, favoring intraaxial.  Surrounding edema is also notably increased. Patient prefers to switch her care back to me for further evaluation.   INTERVAL HISTORY Kristina Wright is a 64 y.o. female who has above history reviewed by me today presents for follow up visit for management of lung cancer, and cancer related symptoms. Problems and complaints are listed below: Patient is currently on 3 mg of dexamethasone BID.  No additional episodes of hallucination. No seizure episodes. Patient has establish care with vascular surgery and bilateral lower extremity has been recommended to be placed in Unna boots. No DVT on  ultrasound.  Patient reports having bilateral lower extremity pain/numbness. She takes oxycodone with some improvement. She does not sleep very well at night.  Review of Systems  Constitutional: Negative for chills, fever, malaise/fatigue and weight loss.  HENT: Negative for nosebleeds and sore throat.   Eyes: Negative for double vision, photophobia and redness.  Respiratory: Negative for cough, shortness of breath and wheezing.   Cardiovascular: Positive for leg swelling. Negative for chest pain, palpitations and orthopnea.  Gastrointestinal: Negative for abdominal pain, blood in stool, nausea and vomiting.  Genitourinary: Negative for dysuria.  Musculoskeletal: Negative for back pain, myalgias and neck pain.  Skin: Negative for itching and rash.       Bilateral upper lower extremity/inner thigh erythematous changes.  Neurological: Negative for dizziness, tingling, tremors and headaches.  Endo/Heme/Allergies: Negative for environmental allergies. Does not bruise/bleed easily.  Psychiatric/Behavioral: Negative for depression and hallucinations. The patient has insomnia. The patient is not nervous/anxious.     MEDICAL HISTORY:  Past Medical History:  Diagnosis Date   Cancer [facility]) 1989   cervical   Depression    Family history of breast cancer    Family history of colon cancer    Family history of ovarian cancer    Family history of stomach cancer    Family history of throat cancer    Hyperlipidemia    Hypertension    Malignant neoplasm of lung ([place]) 03/01/2018   Thyroid disease     SURGICAL HISTORY: Past Surgical History:  Procedure Laterality Date   BILATERAL CARPAL TUNNEL RELEASE     BREAST CYST ASPIRATION Left    LUNG  BIOPSY     PARTIAL HYSTERECTOMY     PORTA CATH INSERTION N/A 03/03/2018   Procedure: PORTA CATH INSERTION;  Surgeon: Katha Cabal, MD;  Location: [facility];  Service: Cardiovascular;  Laterality: N/A;   TUBAL LIGATION        SOCIAL HISTORY: Social History   Socioeconomic History   Marital status: Single    Spouse name: Not on file   Number of children: 3   Years of education: Not on file   Highest education level: Not on file  Occupational History   Not on file  Tobacco Use   Smoking status: Former Smoker    Packs/day: 1.00    Years: 25.00    Pack years: 25.00    Types: Cigarettes    Quit date: 11/19/2017    Years since quitting: 1.9   Smokeless tobacco: Never Used  Vaping Use   Vaping Use: Never used  Substance and Sexual Activity   Alcohol use: No   Drug use: Yes    Types: Marijuana    Comment: occasional   Sexual activity: Not on file  Other Topics Concern   Not on file  Social History Narrative   Not on file   Social Determinants of Health   Financial Resource Strain:    Difficulty of Paying Living Expenses:   Food Insecurity:    Worried About Charity fundraiser in the Last Year:    Arboriculturist in the Last Year:   Transportation Needs:    Film/video editor (Medical):    Lack of Transportation (Non-Medical):   Physical Activity:    Days of Exercise per Week:    Minutes of Exercise per Session:   Stress:    Feeling of Stress :   Social Connections:    Frequency of Communication with Friends and Family:    Frequency of Social Gatherings with Friends and Family:    Attends Religious Services:    Active Member of Clubs or Organizations:    Attends Music therapist:    Marital Status:   Intimate Partner Violence:    Fear of Current or Ex-Partner:    Emotionally Abused:    Physically Abused:    Sexually Abused:     FAMILY HISTORY: Family History  Problem Relation Age of Onset   Colon cancer Mother 75   Colon cancer Maternal Grandmother 3   Hypertension Father    Breast cancer Sister 50   Throat cancer Brother    Heart attack Maternal Aunt    Ovarian cancer Maternal Aunt 91   Stomach cancer Paternal  Grandmother        dx >50   Throat cancer Brother     ALLERGIES:  has No Known Allergies.  MEDICATIONS:  Current Outpatient Medications  Medication Sig Dispense Refill   ALPRAZolam (XANAX) 0.25 MG tablet Take 1 tablet (0.25 mg total) by mouth at bedtime as needed. for anxiety AS NEEDED ONLY! 30 tablet 0   amoxicillin-clavulanate (AUGMENTIN) 875-125 MG tablet Take 1 tablet by mouth 2 (two) times daily. 20 tablet 0   aspirin EC 81 MG tablet Take 81 mg by mouth daily.     atorvastatin (LIPITOR) 10 MG tablet Take 1 tablet (10 mg total) by mouth daily. 90 tablet 1   busPIRone (BUSPAR) 5 MG tablet Take 1 tablet (5 mg total) by mouth daily. 90 tablet 1   chlorhexidine (PERIDEX) 0.12 % solution Place 15-75m of solution in warm water and  soak fingers QID. 473 mL 0   clopidogrel (PLAVIX) 75 MG tablet Take 1 tablet (75 mg total) by mouth daily. 90 tablet 0   dexamethasone (DECADRON) 1 MG tablet Take 2 tablets (2 mg total) by mouth as directed. Take 2 tablets ('2mg'$  total) by mouth 2 times a day for 1 week , then 2 tabs ('2mg'$ ) daily for 1 week 42 tablet 0   FLUoxetine (PROZAC) 10 MG capsule Take 1 capsule (10 mg total) by mouth daily. 90 capsule 1   furosemide (LASIX) 20 MG tablet Take 1 tablet (20 mg total) by mouth 2 (two) times daily. 60 tablet 3   levETIRAcetam (KEPPRA) 500 MG tablet Take 1 tablet (500 mg total) by mouth 2 (two) times daily. 60 tablet 2   levothyroxine (SYNTHROID) 112 MCG tablet Take 1 tablet (112 mcg total) by mouth daily. 90 tablet 1   lidocaine (XYLOCAINE) 2 % solution Use as directed 15 mLs in the mouth or throat as needed for mouth pain. 100 mL 0   lidocaine-prilocaine (EMLA) cream Apply 1 application topically as needed.     lisinopril-hydrochlorothiazide (ZESTORETIC) 20-25 MG tablet Take 1 tablet by mouth daily. 90 tablet 0   Multiple Vitamins-Minerals (MULTIVITAMIN ADULT PO) Take 1 tablet by mouth daily.      mupirocin ointment (BACTROBAN) 2 % Apply 1  application topically 2 (two) times daily. 22 g 0   nystatin cream (MYCOSTATIN) Apply 1 application topically 2 (two) times daily. 30 g 0   Olopatadine HCl 0.2 % SOLN Apply 1 drop to eye in the morning and at bedtime. 2.5 mL 1   omeprazole (PRILOSEC) 20 MG capsule Take 1 capsule (20 mg total) by mouth daily. 30 capsule 2   osimertinib mesylate (TAGRISSO) 80 MG tablet Take 1 tablet (80 mg total) by mouth daily. 30 tablet 2   oxyCODONE (OXY IR/ROXICODONE) 5 MG immediate release tablet Take 1 tablet (5 mg total) by mouth every 6 (six) hours as needed for severe pain. 30 tablet 0   umeclidinium-vilanterol (ANORO ELLIPTA) 62.5-25 MCG/INH AEPB Inhale 1 puff into the lungs daily. 60 each 5   valACYclovir (VALTREX) 1000 MG tablet Take 1 tablet (1,000 mg total) by mouth 3 (three) times daily. 21 tablet 1   VENTOLIN HFA 108 (90 Base) MCG/ACT inhaler Inhale 1-2 puffs into the lungs every 6 (six) hours as needed for wheezing or shortness of breath. 18 g 1   dexamethasone (DECADRON) 4 MG tablet Take 1 tablet (4 mg total) by mouth 2 (two) times daily. (Patient not taking: Reported on 10/17/2019) 28 tablet 1   gabapentin (NEURONTIN) 300 MG capsule Take 1 capsule (300 mg total) by mouth 2 (two) times daily. 60 capsule 1   hydrOXYzine (ATARAX/VISTARIL) 10 MG tablet Take 1 tablet (10 mg total) by mouth daily. (Patient not taking: Reported on 10/20/2019) 30 tablet 5   No current facility-administered medications for this visit.     PHYSICAL EXAMINATION: ECOG PERFORMANCE STATUS: 1 - Symptomatic but completely ambulatory Vitals:   10/20/19 1126  BP: (!) 147/84  Pulse: (!) 114  Resp: 18  Temp: (!) 96.6 F (35.9 C)   Filed Weights   10/20/19 1126  Weight: 227 lb 4.8 oz (103.1 kg)    Physical Exam Constitutional:      General: She is not in acute distress. HENT:     Head: Normocephalic and atraumatic.  Eyes:     General: No scleral icterus.    Pupils: Pupils are equal, round, and reactive  to  light.  Cardiovascular:     Rate and Rhythm: Normal rate and regular rhythm.     Heart sounds: Normal heart sounds.  Pulmonary:     Effort: Pulmonary effort is normal. No respiratory distress.     Breath sounds: No wheezing.     Comments: Decreased breath sounds bilaterally Abdominal:     General: Bowel sounds are normal. There is no distension.     Palpations: Abdomen is soft. There is no mass.     Tenderness: There is no abdominal tenderness.  Musculoskeletal:        General: Swelling present. No deformity. Normal range of motion.     Cervical back: Normal range of motion and neck supple.     Comments: Bilateral lower extremity edema, unna boots  Skin:    Findings: No rash.  Neurological:     Mental Status: She is alert and oriented to person, place, and time. Mental status is at baseline.     Cranial Nerves: No cranial nerve deficit.     Coordination: Coordination normal.  Psychiatric:        Mood and Affect: Mood normal.      LABORATORY DATA:  I have reviewed the data as listed Lab Results  Component Value Date   WBC 20.8 (H) 10/20/2019   HGB 12.8 10/20/2019   HCT 37.7 10/20/2019   MCV 90.6 10/20/2019   PLT 268 10/20/2019   Recent Labs    09/22/19 1112 10/07/19 1251 10/20/19 1057  NA 135 138 129*  K 4.0 4.1 4.0  CL 102 100 94*  CO2 _0 GLUCOSE 147* 129* 208*  BUN 20 27* 35*  CREATININE 0.78 0.90 1.09*  CALCIUM 8.6* 8.7* 8.6*  GFRNONAA >60 >60 55*  GFRAA >60 >60 >60  PROT 6.3* 6.8 6.9  ALBUMIN 3.4* 3.4* 3.3*  AST _1 ALT 38 25 29  ALKPHOS 60 68 70  BILITOT 0.4 0.5 0.6   Iron/TIBC/Ferritin/ %Sat No results found for: IRON, TIBC, FERRITIN, IRONPCTSAT   RADIOGRAPHIC STUDIES: I have personally reviewed the radiological images as listed and agreed with the findings in the report. CT HEAD WO CONTRAST  Result Date: 08/03/2019 CLINICAL DATA:  Transit ischemic attack. EXAM: CT HEAD WITHOUT CONTRAST TECHNIQUE: Contiguous axial images were  obtained from the base of the skull through the vertex without intravenous contrast. COMPARISON:  03/10/2018 MRI head. FINDINGS: Brain: There is a peripherally hyperdense 2.0 x 1.9 x 2.5 cm right frontal lesion (2:18, 4:25), new since prior exam. New right frontal/perilesional vasogenic edema likely involving the right basal ganglia, posterior limb of the right internal capsule and thalamus. Partial effacement of the right lateral ventricle with 4 mm leftward midline shift (2:17). No ventriculomegaly. No extra-axial fluid collection. Vascular: No hyperdense vessel. Bilateral carotid siphon atherosclerotic calcifications. Skull: Negative for fracture or focal lesion. Sinuses/Orbits: Normal orbits. Clear paranasal sinuses. No mastoid effusion. Other: None. IMPRESSION: Peripherally hyperdense 2.5 cm right frontal lesion is concerning for hemorrhagic metastases versus primary neoplasm. Further evaluation with MRI brain with and without contrast is recommended. Right frontal perilesional edema involving the right basal ganglia and thalamus. Partial right lateral ventricle effacement with 4 mm leftward midline shift. These results were called by telephone at the time of interpretation on 08/03/2019 at 1:22 pm to provider Blake Divine , who verbally acknowledged these results. Electronically Signed   By: Primitivo Gauze M.D.   On: 08/03/2019 13:26   CT CHEST W CONTRAST  Result Date:  08/09/2019 CLINICAL DATA:  Metastatic lung cancer. EXAM: CT CHEST, ABDOMEN, AND PELVIS WITH CONTRAST TECHNIQUE: Multidetector CT imaging of the chest, abdomen and pelvis was performed following the standard protocol during bolus administration of intravenous contrast. CONTRAST:  14m OMNIPAQUE IOHEXOL 300 MG/ML  SOLN COMPARISON:  CT chest 07/20/2019. PET-CT 12/29/2018. FINDINGS: CT CHEST FINDINGS Cardiovascular: The heart size is normal. No substantial pericardial effusion. Atherosclerotic calcification is noted in the wall of the  thoracic aorta. Right Port-A-Cath tip is positioned in the upper right atrium. Mediastinum/Nodes: No mediastinal lymphadenopathy. 12 mm short axis left hilar node visible on 31/2. Probably present on prior study although difficult to assess given lack of intravenous contrast on the earlier exams. No right hilar lymphadenopathy. Tiny hiatal hernia. The esophagus has normal imaging features. There is no axillary lymphadenopathy. Lungs/Pleura: Bandlike interstitial and airspace opacity in the lateral left upper and lower lobes is similar to prior although the collapse/consolidative opacity in the lingula has decreased slightly in the interval. Imaging features likely reflect sequelae of radiation therapy. No suspicious pulmonary nodule or mass. No pleural effusion. Musculoskeletal: No worrisome lytic or sclerotic osseous abnormality. CT ABDOMEN PELVIS FINDINGS Hepatobiliary: No suspicious focal abnormality within the liver parenchyma. There is no evidence for gallstones, gallbladder wall thickening, or pericholecystic fluid. No intrahepatic or extrahepatic biliary dilation. Pancreas: No focal mass lesion. No dilatation of the main duct. No intraparenchymal cyst. No peripancreatic edema. Spleen: No splenomegaly. No focal mass lesion. Adrenals/Urinary Tract: No adrenal nodule or mass. Kidneys unremarkable. No evidence for hydroureter. The urinary bladder appears normal for the degree of distention. Stomach/Bowel: Tiny hiatal hernia. Stomach is unremarkable. No gastric wall thickening. No evidence of outlet obstruction. Duodenum is normally positioned as is the ligament of Treitz. Duodenal diverticulum evident. No small bowel wall thickening. No small bowel dilatation. The terminal ileum is normal. The appendix is normal. No gross colonic mass. No colonic wall thickening. Diverticular changes are noted in the left colon without evidence of diverticulitis. Vascular/Lymphatic: There is abdominal aortic atherosclerosis  without aneurysm. There is no gastrohepatic or hepatoduodenal ligament lymphadenopathy. No retroperitoneal or mesenteric lymphadenopathy. No pelvic sidewall lymphadenopathy. Reproductive: The uterus is unremarkable. 2.5 cm cystic lesion left ovary is stable since PET-CT 12/29/2018. Other: No intraperitoneal free fluid. Musculoskeletal: No worrisome lytic or sclerotic osseous abnormality. IMPRESSION: 1. Chest CT stable since 07/20/2019 without new or progressive findings. 2. 12 mm short axis left hilar lymph node is probably stable since prior study although difficult to assess given lack of intravenous contrast on the earlier exams. Close attention on follow-up recommended. 3. No findings to suggest metastatic disease in the abdomen/pelvis. 4. Left colonic diverticulosis without diverticulitis. 5. Stable 2.5 cm cystic lesion left ovary, not hypermetabolic on previous PET imaging and likely benign. 6. Aortic Atherosclerosis (ICD10-I70.0). Electronically Signed   By: EMisty StanleyM.D.   On: 08/09/2019 16:54   MR Brain W Wo Contrast  Result Date: 09/29/2019 CLINICAL DATA:  Brain metastasis. Intractable headache, unspecified chronicity pattern, unspecified headache type. Additional history provided: Headache/possible hallucinations/history of brain mets. Status post brain radiation, patient reports severe headache for several days. EXAM: MRI HEAD WITHOUT AND WITH CONTRAST TECHNIQUE: Multiplanar, multiecho pulse sequences of the brain and surrounding structures were obtained without and with intravenous contrast. CONTRAST:  165mGADAVIST GADOBUTROL 1 MMOL/ML IV SOLN COMPARISON:  Prior brain MRI examinations 08/31/2019 and earlier FINDINGS: Brain: There has been an interval increase in enhancement at site of a solitary metastasis centered within the right frontal operculum, now  encompassing a region of 3.4 cm (previously 2.8 cm). As before, portions of the mass are inseparable from the overlying dura and there is  adjacent mild dural thickening and enhancement. Surrounding edema has notably increased. There is increased mass effect with increased partial effacement of the right lateral ventricle and now 6 mm leftward midline shift measured at the level of the septum pellucidum. Redemonstrated small foci of SWI signal loss along the anterolateral aspect of the mass, which may reflect small foci of non-acute hemorrhage. No new intracranial enhancing metastasis is demonstrated. Stable nonspecific symmetric enhancement within the IAC fundus bilaterally. Additional patchy T2/FLAIR hyperintensity within the cerebral white matter is nonspecific, but consistent with chronic small vessel ischemic disease. There is no acute infarct. No extra-axial fluid collection. Vascular: Expected proximal arterial flow voids. Skull and upper cervical spine: No focal marrow lesion. Sinuses/Orbits: Visualized orbits show no acute finding. Mild ethmoid sinus mucosal thickening. No significant mastoid effusion. IMPRESSION: Interval increase in enhancement at site of a solitary right frontal operculum metastasis, now encompassing a region of 3.4 cm (previously 2.8 cm). As before, the mass is inseparable from the overlying dura, although the lesion is favored intra-axial. Surrounding edema has also notably increased. This increased enhancement and edema may reflect inflammatory changes from recent radiation therapy. Attention recommended on follow-up. Increased mass effect with increased partial effacement of the right lateral ventricle and now 6 mm leftward midline shift. No new intracranial metastases are identified. Electronically Signed   By: Kellie Simmering DO   On: 09/29/2019 13:56   MR Brain W Wo Contrast  Result Date: 08/31/2019 CLINICAL DATA:  64 year old female with lung cancer and solitary right frontal metastasis by MRI last month. SRS treatment planning. EXAM: MRI HEAD WITHOUT AND WITH CONTRAST TECHNIQUE: Multiplanar, multiecho pulse  sequences of the brain and surrounding structures were obtained without and with intravenous contrast. CONTRAST:  40m GADAVIST GADOBUTROL 1 MMOL/ML IV SOLN COMPARISON:  Brain MRI 08/03/2019. brain MRI 03/10/2018. FINDINGS: BRAIN Lobulated and enhancing peripherally located mass at the frontal operculum now measures up to 28 mm diameter and is inseparable from the overlying dura with mild adjacent dural thickening as before. However, this does appear to be intra-axial, with no definite cleft visible on T2 imaging. Regional T2 and FLAIR hyperintensity in keeping with vasogenic edema has mildly regressed. No midline shift. Mildly improved patency of the right lateral ventricle since last month. No other enhancing brain lesion or cerebral edema. No other dural thickening. Anterior left frontal lobe developmental venous anomaly (normal variant). Other Brain findings: No restricted diffusion to suggest acute infarction. No ventriculomegaly, extra-axial collection or acute intracranial hemorrhage. Cervicomedullary junction and pituitary are within normal limits. Axial images raise the possibility of bilateral IAC fundus enhancement (such as series 14, image 59 on the right), but this is stable since 2019 and also not definitely correlated on the coronal or sagittal postcontrast images. Vascular: Major intracranial vascular flow voids are stable. The major dural venous sinuses are enhancing and appear to be patent. Skull and upper cervical spine: Negative visible cervical spine and spinal cord. Bone marrow signal is stable and within normal limits. Sinuses/Orbits: Negative orbits. Paranasal Visualized paranasal sinuses and mastoids are stable and well pneumatized. Other: Scalp and face soft tissues appear negative. IMPRESSION: 1. Solitary enhancing mass at the right frontal operculum now measures up to 28 mm diameter. As before this is inseparable from the overlying dura which appears mildly thickened, although I favor this  is an intra-axial rather than  extra-axial metastasis. 2. Mild regression of vasogenic edema and mass effect since last month. No midline shift. 3. No new metastatic disease identified. Electronically Signed   By: Genevie Ann M.D.   On: 08/31/2019 12:50   MR Brain W and Wo Contrast  Result Date: 08/03/2019 CLINICAL DATA:  Abnormal CT, history of lung cancer EXAM: MRI HEAD WITHOUT AND WITH CONTRAST TECHNIQUE: Multiplanar, multiecho pulse sequences of the brain and surrounding structures were obtained without and with intravenous contrast. CONTRAST:  66m GADAVIST GADOBUTROL 1 MMOL/ML IV SOLN COMPARISON:  MRI 2019, CT earlier same day FINDINGS: Brain: Corresponding to abnormality on CT, there is an enhancing lesion of the peripheral right frontal lobe involving or abutting the lateral precentral gyrus. This measures approximately 2.1 x 2.1 x 2.5 cm. There is significant surrounding edema resulting in regional sulcal effacement, partial effacement of the right lateral ventricle, and mild leftward midline shift measuring 4 mm. No associated reduced diffusion. There is mild susceptibility hypointensity, which may reflect intralesional blood products. There is no additional mass or abnormal enhancement. No hydrocephalus. Additional patchy T2 hyperintensity in the supratentorial white matter is nonspecific but may reflect mild chronic microvascular ischemic changes. No acute infarction. Vascular: Major vessel flow voids at the skull base are preserved. Skull and upper cervical spine: Normal marrow signal is preserved. Sinuses/Orbits: Trace paranasal sinus mucosal thickening. Orbits are unremarkable. Other: Sella is unremarkable. IMPRESSION: Right frontal mass likely reflecting a metastasis. Significant surrounding edema with mild regional mass effect including subcentimeter leftward midline shift. No hydrocephalus. Electronically Signed   By: PMacy MisM.D.   On: 08/03/2019 18:28   CT ABDOMEN PELVIS W  CONTRAST  Result Date: 08/09/2019 CLINICAL DATA:  Metastatic lung cancer. EXAM: CT CHEST, ABDOMEN, AND PELVIS WITH CONTRAST TECHNIQUE: Multidetector CT imaging of the chest, abdomen and pelvis was performed following the standard protocol during bolus administration of intravenous contrast. CONTRAST:  1075mOMNIPAQUE IOHEXOL 300 MG/ML  SOLN COMPARISON:  CT chest 07/20/2019. PET-CT 12/29/2018. FINDINGS: CT CHEST FINDINGS Cardiovascular: The heart size is normal. No substantial pericardial effusion. Atherosclerotic calcification is noted in the wall of the thoracic aorta. Right Port-A-Cath tip is positioned in the upper right atrium. Mediastinum/Nodes: No mediastinal lymphadenopathy. 12 mm short axis left hilar node visible on 31/2. Probably present on prior study although difficult to assess given lack of intravenous contrast on the earlier exams. No right hilar lymphadenopathy. Tiny hiatal hernia. The esophagus has normal imaging features. There is no axillary lymphadenopathy. Lungs/Pleura: Bandlike interstitial and airspace opacity in the lateral left upper and lower lobes is similar to prior although the collapse/consolidative opacity in the lingula has decreased slightly in the interval. Imaging features likely reflect sequelae of radiation therapy. No suspicious pulmonary nodule or mass. No pleural effusion. Musculoskeletal: No worrisome lytic or sclerotic osseous abnormality. CT ABDOMEN PELVIS FINDINGS Hepatobiliary: No suspicious focal abnormality within the liver parenchyma. There is no evidence for gallstones, gallbladder wall thickening, or pericholecystic fluid. No intrahepatic or extrahepatic biliary dilation. Pancreas: No focal mass lesion. No dilatation of the main duct. No intraparenchymal cyst. No peripancreatic edema. Spleen: No splenomegaly. No focal mass lesion. Adrenals/Urinary Tract: No adrenal nodule or mass. Kidneys unremarkable. No evidence for hydroureter. The urinary bladder appears normal  for the degree of distention. Stomach/Bowel: Tiny hiatal hernia. Stomach is unremarkable. No gastric wall thickening. No evidence of outlet obstruction. Duodenum is normally positioned as is the ligament of Treitz. Duodenal diverticulum evident. No small bowel wall thickening. No small bowel dilatation. The  terminal ileum is normal. The appendix is normal. No gross colonic mass. No colonic wall thickening. Diverticular changes are noted in the left colon without evidence of diverticulitis. Vascular/Lymphatic: There is abdominal aortic atherosclerosis without aneurysm. There is no gastrohepatic or hepatoduodenal ligament lymphadenopathy. No retroperitoneal or mesenteric lymphadenopathy. No pelvic sidewall lymphadenopathy. Reproductive: The uterus is unremarkable. 2.5 cm cystic lesion left ovary is stable since PET-CT 12/29/2018. Other: No intraperitoneal free fluid. Musculoskeletal: No worrisome lytic or sclerotic osseous abnormality. IMPRESSION: 1. Chest CT stable since 07/20/2019 without new or progressive findings. 2. 12 mm short axis left hilar lymph node is probably stable since prior study although difficult to assess given lack of intravenous contrast on the earlier exams. Close attention on follow-up recommended. 3. No findings to suggest metastatic disease in the abdomen/pelvis. 4. Left colonic diverticulosis without diverticulitis. 5. Stable 2.5 cm cystic lesion left ovary, not hypermetabolic on previous PET imaging and likely benign. 6. Aortic Atherosclerosis (ICD10-I70.0). Electronically Signed   By: Misty Stanley M.D.   On: 08/09/2019 16:54   US Venous Img Lower Bilateral (DVT)  Result Date: 10/04/2019 CLINICAL DATA:  Bilateral lower extremity pain and edema. History of lung cancer. Evaluate for DVT. EXAM: BILATERAL LOWER EXTREMITY VENOUS DOPPLER ULTRASOUND TECHNIQUE: Gray-scale sonography with graded compression, as well as color Doppler and duplex ultrasound were performed to evaluate the lower  extremity deep venous systems from the level of the common femoral vein and including the common femoral, femoral, profunda femoral, popliteal and calf veins including the posterior tibial, peroneal and gastrocnemius veins when visible. The superficial great saphenous vein was also interrogated. Spectral Doppler was utilized to evaluate flow at rest and with distal augmentation maneuvers in the common femoral, femoral and popliteal veins. COMPARISON:  None. FINDINGS: RIGHT LOWER EXTREMITY Common Femoral Vein: No evidence of thrombus. Normal compressibility, respiratory phasicity and response to augmentation. Saphenofemoral Junction: No evidence of thrombus. Normal compressibility and flow on color Doppler imaging. Profunda Femoral Vein: No evidence of thrombus. Normal compressibility and flow on color Doppler imaging. Femoral Vein: No evidence of thrombus. Normal compressibility, respiratory phasicity and response to augmentation. Popliteal Vein: No evidence of thrombus. Normal compressibility, respiratory phasicity and response to augmentation. Calf Veins: No evidence of thrombus. Normal compressibility and flow on color Doppler imaging. Superficial Great Saphenous Vein: No evidence of thrombus. Normal compressibility. Venous Reflux:  None. Other Findings:  None. LEFT LOWER EXTREMITY Common Femoral Vein: No evidence of thrombus. Normal compressibility, respiratory phasicity and response to augmentation. Saphenofemoral Junction: No evidence of thrombus. Normal compressibility and flow on color Doppler imaging. Profunda Femoral Vein: No evidence of thrombus. Normal compressibility and flow on color Doppler imaging. Femoral Vein: No evidence of thrombus. Normal compressibility, respiratory phasicity and response to augmentation. Popliteal Vein: No evidence of thrombus. Normal compressibility, respiratory phasicity and response to augmentation. Calf Veins: No evidence of thrombus. Normal compressibility and flow on  color Doppler imaging. Superficial Great Saphenous Vein: No evidence of thrombus. Normal compressibility. Venous Reflux:  None. Other Findings:  None. IMPRESSION: No evidence of DVT within either lower extremity. Electronically Signed   By: Sandi Mariscal M.D.   On: 10/04/2019 16:21    ASSESSMENT & PLAN:  1. Adenocarcinoma of lung, stage 4, unspecified laterality ([place])   2. Bilateral lower extremity edema   3. Brain metastasis ([place])   4. Hyperglycemia   Cancer Staging Malignant neoplasm of lung ([place]) Staging form: Lung, AJCC 8th Edition - Clinical stage from 03/12/2018: Stage IV (cT1, cN3, pM1) - Signed  by Earlie Server, MD on 03/12/2018  # EGFR mutation [[id number]] positive Stage IV lung adenocarcinoma.  On osimertinib 80 mg daily,  She developed frontal lobe CNS metastasis while on TKI.  Status post SRS Recent MRI brain was independently reviewed and discussed with radiation oncology Dr. Baruch Gouty. Her radiation oncology, no additional intervention at this point.  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 She is currently on 3 mg dexamethasone twice daily. Recommend patient to finish her current supply and then switch to 2 mg twice daily for 1 week followed by 2 mg daily. Her neurologic symptoms has improved since the start of dexamethasone. Continue GI prophylaxis with omeprazole 20 mg daily Continue Keppra 500 mg daily twice daily. Avoid driving  Hyperglycemia is likely secondary to steroid use.  Hyponatremia with sodium of 129, recommend patient to try Gatorade sugar-free oral hydration. We discussed about IV hydration and patient is not interested. We will hold off for now.  Bilateral lower extremity edema, follow-up with vascular surgery. Negative for DVT on ultrasound. Check 2D echo. Recommend patient to continue follow-up with palliative care service. Recommend home care nurse from medicine management. For bilateral lower extremity pain, patient is taking  oxycodone 5 mg every 6 hours as needed for pain. Add trials of gabapentin 300 mg 1-2 times daily.   Follow-up in 2 weeks for further discussion.   We spent sufficient time to discuss many aspect of care, questions were answered to patient's satisfaction. The patient knows to call the clinic with any problems questions or concerns.   Earlie Server, MD, PhD Hematology Oncology [facility] Pager- [phone] 10/20/2019

## 2019-10-25 ENCOUNTER — Encounter (INDEPENDENT_AMBULATORY_CARE_PROVIDER_SITE_OTHER): Payer: Medicaid Other

## 2019-10-26 ENCOUNTER — Ambulatory Visit: Payer: 59 | Admitting: Internal Medicine

## 2019-10-26 ENCOUNTER — Telehealth: Payer: Self-pay | Admitting: Family Medicine

## 2019-10-26 NOTE — Telephone Encounter (Unsigned)
Copied from [place] [id number]. Topic: General - Inquiry >> Oct 26, 2019  2:51 PM Scherrie Gerlach wrote: Reason for CRM: Sister Mrs Dema Severin would like you to give her call back declined o give any other info

## 2019-10-27 ENCOUNTER — Ambulatory Visit: Payer: 59 | Admitting: Family Medicine

## 2019-10-27 ENCOUNTER — Ambulatory Visit: Payer: 59 | Attending: Oncology

## 2019-10-27 NOTE — Telephone Encounter (Signed)
done

## 2019-10-28 ENCOUNTER — Telehealth: Payer: Self-pay | Admitting: Family Medicine

## 2019-10-28 ENCOUNTER — Telehealth: Payer: Self-pay | Admitting: *Deleted

## 2019-10-28 NOTE — Telephone Encounter (Signed)
Patient sister Kendrick Fries who is not on her release of information called reporting that patient has had watery loose diarrhea for 7 days and that she is not eating or able to get up. I called patient and she was upset that she had called and states that the diarrhea is better and that the nurse came to see her today and said her vs are good a nd. that she seems to be doing well. Patient said to ignore anything her  Sister says and that if she is not better over weekend she will call

## 2019-10-28 NOTE — Telephone Encounter (Addendum)
Patient sister [name] who is on her ROI has called and states that the patient is lying and that her home health nurse has not come out today and that she needs something done. She is having incontinence of stool trying to get to the bathroom  And not making it there. Her children are out of town and she is being defiant of her sisters trying to help her. She asked that I let Dr Tasia Catchings know that she called. I suggested that she can call her home health nurse and ask her to come check her , call EMS to come check her, but if she refuses to go to ER, they cannot make her go, or to contact [facility]

## 2019-10-28 NOTE — Telephone Encounter (Signed)
Dr. Tasia Catchings has reviewed messages and agrees with triage advice.

## 2019-10-28 NOTE — Telephone Encounter (Signed)
Copied from [place] [phone]. Topic: General - Inquiry >> Oct 28, 2019  2:10 PM Alease Frame wrote: Reason for CRM: Pts son called wanting to speak with Otilio Miu in regards to pts current state. If noone answers please leave a detailed message with call back number . Please advise

## 2019-10-28 NOTE — Telephone Encounter (Signed)
I do not have son listed on pt's chart

## 2019-10-30 ENCOUNTER — Emergency Department: Payer: 59

## 2019-10-30 ENCOUNTER — Inpatient Hospital Stay: Payer: 59 | Admitting: Certified Registered"

## 2019-10-30 ENCOUNTER — Inpatient Hospital Stay
Admission: EM | Admit: 2019-10-30 | Discharge: 2019-11-21 | DRG: 329 | Disposition: A | Discharge status: Skilled Nursing Facility | Payer: 59 | Attending: Internal Medicine | Admitting: Internal Medicine

## 2019-10-30 ENCOUNTER — Inpatient Hospital Stay: Payer: 59

## 2019-10-30 ENCOUNTER — Other Ambulatory Visit: Payer: Self-pay

## 2019-10-30 ENCOUNTER — Encounter
Admission: EM | Disposition: A | Discharge status: Skilled Nursing Facility | Payer: Self-pay | Source: Home / Self Care | Attending: Internal Medicine

## 2019-10-30 DIAGNOSIS — Z20822 Contact with and (suspected) exposure to covid-19: Secondary | ICD-10-CM | POA: Diagnosis present

## 2019-10-30 DIAGNOSIS — L8990 Pressure ulcer of unspecified site, unspecified stage: Secondary | ICD-10-CM | POA: Insufficient documentation

## 2019-10-30 DIAGNOSIS — T380X5A Adverse effect of glucocorticoids and synthetic analogues, initial encounter: Secondary | ICD-10-CM | POA: Diagnosis not present

## 2019-10-30 DIAGNOSIS — G9341 Metabolic encephalopathy: Secondary | ICD-10-CM | POA: Diagnosis not present

## 2019-10-30 DIAGNOSIS — Z808 Family history of malignant neoplasm of other organs or systems: Secondary | ICD-10-CM

## 2019-10-30 DIAGNOSIS — T8141XA Infection following a procedure, superficial incisional surgical site, initial encounter: Secondary | ICD-10-CM | POA: Diagnosis not present

## 2019-10-30 DIAGNOSIS — E876 Hypokalemia: Secondary | ICD-10-CM | POA: Diagnosis present

## 2019-10-30 DIAGNOSIS — D649 Anemia, unspecified: Secondary | ICD-10-CM

## 2019-10-30 DIAGNOSIS — L97929 Non-pressure chronic ulcer of unspecified part of left lower leg with unspecified severity: Secondary | ICD-10-CM | POA: Diagnosis present

## 2019-10-30 DIAGNOSIS — I10 Essential (primary) hypertension: Secondary | ICD-10-CM | POA: Diagnosis present

## 2019-10-30 DIAGNOSIS — F329 Major depressive disorder, single episode, unspecified: Secondary | ICD-10-CM | POA: Diagnosis present

## 2019-10-30 DIAGNOSIS — Z978 Presence of other specified devices: Secondary | ICD-10-CM

## 2019-10-30 DIAGNOSIS — I890 Lymphedema, not elsewhere classified: Secondary | ICD-10-CM

## 2019-10-30 DIAGNOSIS — R188 Other ascites: Secondary | ICD-10-CM

## 2019-10-30 DIAGNOSIS — G936 Cerebral edema: Secondary | ICD-10-CM | POA: Diagnosis present

## 2019-10-30 DIAGNOSIS — N63 Unspecified lump in unspecified breast: Secondary | ICD-10-CM | POA: Diagnosis present

## 2019-10-30 DIAGNOSIS — R569 Unspecified convulsions: Secondary | ICD-10-CM | POA: Diagnosis not present

## 2019-10-30 DIAGNOSIS — Z7952 Long term (current) use of systemic steroids: Secondary | ICD-10-CM

## 2019-10-30 DIAGNOSIS — K5720 Diverticulitis of large intestine with perforation and abscess without bleeding: Principal | ICD-10-CM | POA: Diagnosis present

## 2019-10-30 DIAGNOSIS — Z923 Personal history of irradiation: Secondary | ICD-10-CM

## 2019-10-30 DIAGNOSIS — K659 Peritonitis, unspecified: Secondary | ICD-10-CM | POA: Diagnosis present

## 2019-10-30 DIAGNOSIS — E782 Mixed hyperlipidemia: Secondary | ICD-10-CM | POA: Diagnosis present

## 2019-10-30 DIAGNOSIS — Z66 Do not resuscitate: Secondary | ICD-10-CM | POA: Diagnosis present

## 2019-10-30 DIAGNOSIS — Z7189 Other specified counseling: Secondary | ICD-10-CM | POA: Diagnosis not present

## 2019-10-30 DIAGNOSIS — E039 Hypothyroidism, unspecified: Secondary | ICD-10-CM | POA: Diagnosis present

## 2019-10-30 DIAGNOSIS — D62 Acute posthemorrhagic anemia: Secondary | ICD-10-CM | POA: Diagnosis not present

## 2019-10-30 DIAGNOSIS — N179 Acute kidney failure, unspecified: Secondary | ICD-10-CM | POA: Diagnosis not present

## 2019-10-30 DIAGNOSIS — E861 Hypovolemia: Secondary | ICD-10-CM | POA: Diagnosis present

## 2019-10-30 DIAGNOSIS — R911 Solitary pulmonary nodule: Secondary | ICD-10-CM | POA: Diagnosis present

## 2019-10-30 DIAGNOSIS — Z85118 Personal history of other malignant neoplasm of bronchus and lung: Secondary | ICD-10-CM

## 2019-10-30 DIAGNOSIS — C7931 Secondary malignant neoplasm of brain: Secondary | ICD-10-CM | POA: Diagnosis present

## 2019-10-30 DIAGNOSIS — Z4659 Encounter for fitting and adjustment of other gastrointestinal appliance and device: Secondary | ICD-10-CM

## 2019-10-30 DIAGNOSIS — C3490 Malignant neoplasm of unspecified part of unspecified bronchus or lung: Secondary | ICD-10-CM | POA: Diagnosis present

## 2019-10-30 DIAGNOSIS — I739 Peripheral vascular disease, unspecified: Secondary | ICD-10-CM | POA: Diagnosis present

## 2019-10-30 DIAGNOSIS — F1290 Cannabis use, unspecified, uncomplicated: Secondary | ICD-10-CM | POA: Diagnosis present

## 2019-10-30 DIAGNOSIS — J9601 Acute respiratory failure with hypoxia: Secondary | ICD-10-CM | POA: Diagnosis not present

## 2019-10-30 DIAGNOSIS — E785 Hyperlipidemia, unspecified: Secondary | ICD-10-CM | POA: Diagnosis present

## 2019-10-30 DIAGNOSIS — K567 Ileus, unspecified: Secondary | ICD-10-CM | POA: Diagnosis not present

## 2019-10-30 DIAGNOSIS — G4700 Insomnia, unspecified: Secondary | ICD-10-CM | POA: Diagnosis present

## 2019-10-30 DIAGNOSIS — E1165 Type 2 diabetes mellitus with hyperglycemia: Secondary | ICD-10-CM | POA: Diagnosis not present

## 2019-10-30 DIAGNOSIS — K631 Perforation of intestine (nontraumatic): Secondary | ICD-10-CM

## 2019-10-30 DIAGNOSIS — J9602 Acute respiratory failure with hypercapnia: Secondary | ICD-10-CM | POA: Diagnosis not present

## 2019-10-30 DIAGNOSIS — Z515 Encounter for palliative care: Secondary | ICD-10-CM

## 2019-10-30 DIAGNOSIS — L97919 Non-pressure chronic ulcer of unspecified part of right lower leg with unspecified severity: Secondary | ICD-10-CM | POA: Diagnosis present

## 2019-10-30 DIAGNOSIS — D696 Thrombocytopenia, unspecified: Secondary | ICD-10-CM | POA: Diagnosis present

## 2019-10-30 DIAGNOSIS — J449 Chronic obstructive pulmonary disease, unspecified: Secondary | ICD-10-CM | POA: Diagnosis present

## 2019-10-30 DIAGNOSIS — Z7982 Long term (current) use of aspirin: Secondary | ICD-10-CM

## 2019-10-30 DIAGNOSIS — Z7902 Long term (current) use of antithrombotics/antiplatelets: Secondary | ICD-10-CM

## 2019-10-30 DIAGNOSIS — Z90711 Acquired absence of uterus with remaining cervical stump: Secondary | ICD-10-CM

## 2019-10-30 DIAGNOSIS — Z6833 Body mass index (BMI) 33.0-33.9, adult: Secondary | ICD-10-CM

## 2019-10-30 DIAGNOSIS — R5381 Other malaise: Secondary | ICD-10-CM | POA: Diagnosis present

## 2019-10-30 DIAGNOSIS — Z803 Family history of malignant neoplasm of breast: Secondary | ICD-10-CM

## 2019-10-30 DIAGNOSIS — Z8041 Family history of malignant neoplasm of ovary: Secondary | ICD-10-CM

## 2019-10-30 DIAGNOSIS — J95821 Acute postprocedural respiratory failure: Secondary | ICD-10-CM | POA: Diagnosis not present

## 2019-10-30 DIAGNOSIS — Z79899 Other long term (current) drug therapy: Secondary | ICD-10-CM

## 2019-10-30 DIAGNOSIS — Z87891 Personal history of nicotine dependence: Secondary | ICD-10-CM

## 2019-10-30 DIAGNOSIS — E871 Hypo-osmolality and hyponatremia: Secondary | ICD-10-CM | POA: Diagnosis present

## 2019-10-30 DIAGNOSIS — Z8541 Personal history of malignant neoplasm of cervix uteri: Secondary | ICD-10-CM

## 2019-10-30 DIAGNOSIS — K651 Peritoneal abscess: Secondary | ICD-10-CM

## 2019-10-30 DIAGNOSIS — L89152 Pressure ulcer of sacral region, stage 2: Secondary | ICD-10-CM | POA: Diagnosis present

## 2019-10-30 DIAGNOSIS — F419 Anxiety disorder, unspecified: Secondary | ICD-10-CM | POA: Diagnosis present

## 2019-10-30 DIAGNOSIS — I700 Atherosclerosis of aorta: Secondary | ICD-10-CM | POA: Diagnosis present

## 2019-10-30 DIAGNOSIS — Z9221 Personal history of antineoplastic chemotherapy: Secondary | ICD-10-CM

## 2019-10-30 DIAGNOSIS — D84821 Immunodeficiency due to drugs: Secondary | ICD-10-CM

## 2019-10-30 HISTORY — PX: LAPAROTOMY: SHX154

## 2019-10-30 HISTORY — PX: BOWEL RESECTION: SHX1257

## 2019-10-30 LAB — BLOOD GAS, ARTERIAL
Acid-base deficit: 6 mmol/L — ABNORMAL HIGH (ref 0.0–2.0)
Bicarbonate: 19.6 mmol/L — ABNORMAL LOW (ref 20.0–28.0)
FIO2: 0.4
MECHVT: 500 mL
Mechanical Rate: 20
O2 Saturation: 95.5 %
PEEP: 5 cmH2O
Patient temperature: 34.8
RATE: 20 resp/min
pCO2 arterial: 34 mmHg (ref 32.0–48.0)
pH, Arterial: 7.35 (ref 7.350–7.450)
pO2, Arterial: 74 mmHg — ABNORMAL LOW (ref 83.0–108.0)

## 2019-10-30 LAB — CK: Total CK: 33 U/L — ABNORMAL LOW (ref 38–234)

## 2019-10-30 LAB — COMPREHENSIVE METABOLIC PANEL
ALT: 43 U/L (ref 0–44)
AST: 26 U/L (ref 15–41)
Albumin: 2.2 g/dL — ABNORMAL LOW (ref 3.5–5.0)
Alkaline Phosphatase: 100 U/L (ref 38–126)
Anion gap: 11 (ref 5–15)
BUN: 43 mg/dL — ABNORMAL HIGH (ref 8–23)
CO2: 22 mmol/L (ref 22–32)
Calcium: 8.3 mg/dL — ABNORMAL LOW (ref 8.9–10.3)
Chloride: 93 mmol/L — ABNORMAL LOW (ref 98–111)
Creatinine, Ser: 1.38 mg/dL — ABNORMAL HIGH (ref 0.44–1.00)
GFR calc Af Amer: 48 mL/min — ABNORMAL LOW (ref >60)
GFR calc non Af Amer: 41 mL/min — ABNORMAL LOW (ref >60)
Glucose, Bld: 172 mg/dL — ABNORMAL HIGH (ref 70–99)
Potassium: 4.5 mmol/L (ref 3.5–5.1)
Sodium: 126 mmol/L — ABNORMAL LOW (ref 135–145)
Total Bilirubin: 0.9 mg/dL (ref 0.3–1.2)
Total Protein: 6.7 g/dL (ref 6.5–8.1)

## 2019-10-30 LAB — GLUCOSE, CAPILLARY: Glucose-Capillary: 126 mg/dL — ABNORMAL HIGH (ref 70–99)

## 2019-10-30 LAB — TROPONIN I (HIGH SENSITIVITY)
Troponin I (High Sensitivity): 22 ng/L — ABNORMAL HIGH (ref <18)
Troponin I (High Sensitivity): 16 ng/L (ref <18)

## 2019-10-30 LAB — CBC
HCT: 32.8 % — ABNORMAL LOW (ref 36.0–46.0)
Hemoglobin: 11.2 g/dL — ABNORMAL LOW (ref 12.0–15.0)
MCH: 30.4 pg (ref 26.0–34.0)
MCHC: 34.1 g/dL (ref 30.0–36.0)
MCV: 88.9 fL (ref 80.0–100.0)
Platelets: 172 10*3/uL (ref 150–400)
RBC: 3.69 MIL/uL — ABNORMAL LOW (ref 3.87–5.11)
RDW: 15.8 % — ABNORMAL HIGH (ref 11.5–15.5)
WBC: 18.5 10*3/uL — ABNORMAL HIGH (ref 4.0–10.5)
nRBC: 0 % (ref 0.0–0.2)

## 2019-10-30 LAB — ABO/RH
ABO/RH(D): O POS
ABO/RH(D): O POS

## 2019-10-30 LAB — SARS CORONAVIRUS 2 BY RT PCR (HOSPITAL ORDER, PERFORMED IN ~~LOC~~ HOSPITAL LAB): SARS Coronavirus 2: NEGATIVE

## 2019-10-30 LAB — LIPASE, BLOOD: Lipase: 20 U/L (ref 11–51)

## 2019-10-30 SURGERY — LAPAROTOMY, EXPLORATORY
Anesthesia: General | Site: Abdomen

## 2019-10-30 MED ORDER — FENTANYL BOLUS VIA INFUSION
50.0000 ug | INTRAVENOUS | Status: DC | PRN
  Filled 2019-10-30: qty 50

## 2019-10-30 MED ORDER — SODIUM CHLORIDE 0.9% IV SOLUTION
Freq: Once | INTRAVENOUS | Status: DC
  Filled 2019-10-30: qty 250

## 2019-10-30 MED ORDER — BUPIVACAINE HCL 0.25 % IJ SOLN
INTRAMUSCULAR | Status: DC | PRN
  Administered 2019-10-30: 30 mL

## 2019-10-30 MED ORDER — SUCCINYLCHOLINE CHLORIDE 20 MG/ML IJ SOLN
INTRAMUSCULAR | Status: DC | PRN
  Administered 2019-10-30: 120 mg via INTRAVENOUS

## 2019-10-30 MED ORDER — ALBUMIN HUMAN 5 % IV SOLN
INTRAVENOUS | Status: DC | PRN
Start: 2019-10-30 — End: 2019-10-30

## 2019-10-30 MED ORDER — IPRATROPIUM-ALBUTEROL 0.5-2.5 (3) MG/3ML IN SOLN: 3.0000 mL | Freq: Four times a day (QID) | RESPIRATORY_TRACT | Status: DC | PRN

## 2019-10-30 MED ORDER — LEVETIRACETAM IN NACL 500 MG/100ML IV SOLN
500.0000 mg | Freq: Two times a day (BID) | INTRAVENOUS | Status: DC
  Administered 2019-10-31 – 2019-11-07 (×16): 500 mg via INTRAVENOUS
  Filled 2019-10-30 (×19): qty 100

## 2019-10-30 MED ORDER — FENTANYL CITRATE (PF) 100 MCG/2ML IJ SOLN
INTRAMUSCULAR | Status: DC | PRN
  Administered 2019-10-30 (×4): 50 ug via INTRAVENOUS

## 2019-10-30 MED ORDER — LIDOCAINE HCL (CARDIAC) PF 100 MG/5ML IV SOSY
PREFILLED_SYRINGE | INTRAVENOUS | Status: DC | PRN
  Administered 2019-10-30: 100 mg via INTRAVENOUS

## 2019-10-30 MED ORDER — SODIUM CHLORIDE 0.9 % IV SOLN
INTRAVENOUS | Status: DC | PRN
  Administered 2019-10-30: 70 mL

## 2019-10-30 MED ORDER — PROPOFOL 1000 MG/100ML IV EMUL
0.0000 ug/kg/min | INTRAVENOUS | Status: DC
  Administered 2019-10-30: 30 ug/kg/min via INTRAVENOUS
  Administered 2019-10-31: 40 ug/kg/min via INTRAVENOUS
  Administered 2019-10-31: 30 ug/kg/min via INTRAVENOUS
  Filled 2019-10-30 (×3): qty 100

## 2019-10-30 MED ORDER — PROPOFOL 10 MG/ML IV BOLUS
INTRAVENOUS | Status: AC
  Filled 2019-10-30: qty 40

## 2019-10-30 MED ORDER — FENTANYL CITRATE (PF) 100 MCG/2ML IJ SOLN
INTRAMUSCULAR | Status: AC
  Filled 2019-10-30: qty 2

## 2019-10-30 MED ORDER — NOREPINEPHRINE 4 MG/250ML-% IV SOLN
INTRAVENOUS | Status: DC | PRN
  Administered 2019-10-30: .03 ug/kg/min via INTRAVENOUS

## 2019-10-30 MED ORDER — INSULIN ASPART 100 UNIT/ML ~~LOC~~ SOLN
0.0000 [IU] | SUBCUTANEOUS | Status: DC
  Administered 2019-10-31 – 2019-11-01 (×7): 1 [IU] via SUBCUTANEOUS
  Administered 2019-11-02 (×2): 2 [IU] via SUBCUTANEOUS
  Administered 2019-11-03: 1 [IU] via SUBCUTANEOUS
  Administered 2019-11-03: 3 [IU] via SUBCUTANEOUS
  Administered 2019-11-04 – 2019-11-06 (×4): 1 [IU] via SUBCUTANEOUS
  Filled 2019-10-30 (×16): qty 1

## 2019-10-30 MED ORDER — PIPERACILLIN-TAZOBACTAM 3.375 G IVPB 30 MIN
3.3750 g | Freq: Once | INTRAVENOUS | Status: AC
  Administered 2019-10-30: 3.375 g via INTRAVENOUS
  Filled 2019-10-30: qty 50

## 2019-10-30 MED ORDER — SODIUM CHLORIDE 0.9 % IV BOLUS
1000.0000 mL | Freq: Once | INTRAVENOUS | Status: AC
  Administered 2019-10-30: 1000 mL via INTRAVENOUS

## 2019-10-30 MED ORDER — SODIUM CHLORIDE 0.9 % IV SOLN
INTRAVENOUS | Status: DC | PRN
Start: 2019-10-30 — End: 2019-10-30

## 2019-10-30 MED ORDER — DEXAMETHASONE SODIUM PHOSPHATE 10 MG/ML IJ SOLN
INTRAMUSCULAR | Status: DC | PRN
  Administered 2019-10-30: 10 mg via INTRAVENOUS

## 2019-10-30 MED ORDER — PIPERACILLIN-TAZOBACTAM 3.375 G IVPB
3.3750 g | Freq: Three times a day (TID) | INTRAVENOUS | Status: DC
  Administered 2019-10-31 – 2019-11-09 (×30): 3.375 g via INTRAVENOUS
  Filled 2019-10-30 (×36): qty 50

## 2019-10-30 MED ORDER — FENTANYL CITRATE (PF) 100 MCG/2ML IJ SOLN
50.0000 ug | Freq: Once | INTRAMUSCULAR | Status: AC
  Administered 2019-10-30: 50 ug via INTRAVENOUS
  Filled 2019-10-30: qty 2

## 2019-10-30 MED ORDER — FAMOTIDINE IN NACL 20-0.9 MG/50ML-% IV SOLN
20.0000 mg | Freq: Two times a day (BID) | INTRAVENOUS | Status: DC
  Administered 2019-10-31 – 2019-11-06 (×14): 20 mg via INTRAVENOUS
  Filled 2019-10-30 (×14): qty 50

## 2019-10-30 MED ORDER — SODIUM CHLORIDE 0.9 % IV SOLN: INTRAVENOUS | Status: DC

## 2019-10-30 MED ORDER — IOHEXOL 300 MG/ML  SOLN
75.0000 mL | Freq: Once | INTRAMUSCULAR | Status: AC | PRN
  Administered 2019-10-30: 75 mL via INTRAVENOUS

## 2019-10-30 MED ORDER — LACTATED RINGERS IV SOLN: INTRAVENOUS | Status: DC | PRN

## 2019-10-30 MED ORDER — ROCURONIUM BROMIDE 100 MG/10ML IV SOLN
INTRAVENOUS | Status: DC | PRN
  Administered 2019-10-30: 30 mg via INTRAVENOUS
  Administered 2019-10-30: 40 mg via INTRAVENOUS

## 2019-10-30 MED ORDER — PROPOFOL 10 MG/ML IV BOLUS
INTRAVENOUS | Status: DC | PRN
  Administered 2019-10-30: 90 mg via INTRAVENOUS

## 2019-10-30 MED ORDER — FENTANYL 2500MCG IN NS 250ML (10MCG/ML) PREMIX INFUSION
50.0000 ug/h | INTRAVENOUS | Status: DC
  Administered 2019-10-31: 50 ug/h via INTRAVENOUS
  Filled 2019-10-30 (×2): qty 250

## 2019-10-30 SURGICAL SUPPLY — 56 items
APPLIER CLIP 13 LRG OPEN (CLIP) ×3
BLADE SURG SZ10 CARB STEEL (BLADE) ×3 IMPLANT
BULB RESERV EVAC DRAIN JP 100C (MISCELLANEOUS) ×3 IMPLANT
CANISTER SUCT 1200ML W/VALVE (MISCELLANEOUS) ×3 IMPLANT
CHLORAPREP W/TINT 26 (MISCELLANEOUS) ×3 IMPLANT
CLIP APPLIE 13 LRG OPEN (CLIP) ×2 IMPLANT
COVER WAND RF STERILE (DRAPES) ×3 IMPLANT
DRAIN CHANNEL JP 19F (MISCELLANEOUS) ×3 IMPLANT
DRAPE LAPAROTOMY 100X77 ABD (DRAPES) ×3 IMPLANT
DRSG OPSITE POSTOP 4X10 (GAUZE/BANDAGES/DRESSINGS) ×3 IMPLANT
DRSG OPSITE POSTOP 4X12 (GAUZE/BANDAGES/DRESSINGS) ×3 IMPLANT
DRSG OPSITE POSTOP 4X8 (GAUZE/BANDAGES/DRESSINGS) ×3 IMPLANT
ELECT BLADE 6 FLAT ULTRCLN (ELECTRODE) ×3 IMPLANT
ELECT REM PT RETURN 9FT ADLT (ELECTROSURGICAL) ×6
ELECTRODE REM PT RTRN 9FT ADLT (ELECTROSURGICAL) ×4 IMPLANT
GAUZE SPONGE 4X4 12PLY STRL (GAUZE/BANDAGES/DRESSINGS) ×3 IMPLANT
GLOVE BIOGEL PI IND STRL 7.0 (GLOVE) ×2 IMPLANT
GLOVE BIOGEL PI INDICATOR 7.0 (GLOVE) ×1
GLOVE SURG SYN 6.5 ES PF (GLOVE) ×3 IMPLANT
GOWN STRL REUS W/ TWL LRG LVL3 (GOWN DISPOSABLE) ×4 IMPLANT
GOWN STRL REUS W/TWL LRG LVL3 (GOWN DISPOSABLE) ×11 IMPLANT
HANDLE SUCTION POOLE (INSTRUMENTS) ×2 IMPLANT
KIT TURNOVER KIT A (KITS) ×3 IMPLANT
LABEL OR SOLS (LABEL) ×3 IMPLANT
LIGASURE IMPACT 36 18CM CVD LR (INSTRUMENTS) ×3 IMPLANT
NEEDLE HYPO 22GX1.5 SAFETY (NEEDLE) ×3 IMPLANT
NS IRRIG 1000ML POUR BTL (IV SOLUTION) ×3 IMPLANT
PACK BASIN MAJOR (MISCELLANEOUS) ×3 IMPLANT
RELOAD LINEAR CUT PROX 55 BLUE (ENDOMECHANICALS) IMPLANT
RELOAD PROXIMATE 75MM BLUE (ENDOMECHANICALS) ×3 IMPLANT
RELOAD STAPLER LINEAR PROX 30 (STAPLE) IMPLANT
SPONGE KITTNER 5P (MISCELLANEOUS) ×3 IMPLANT
SPONGE LAP 18X18 RF (DISPOSABLE) ×3 IMPLANT
STAPLER CUT CVD 40MM GREEN (STAPLE) ×3 IMPLANT
STAPLER CUT RELOAD GREEN (STAPLE) ×3 IMPLANT
STAPLER PROXIMATE 75MM BLUE (STAPLE) ×3 IMPLANT
STAPLER RELOAD LINEAR PROX 30 (STAPLE)
STAPLER SKIN PROX 35W (STAPLE) ×3 IMPLANT
SUCTION POOLE HANDLE (INSTRUMENTS) ×3
SUT CHROMIC 0 SH (SUTURE) IMPLANT
SUT CHROMIC 2 0 SH (SUTURE) IMPLANT
SUT CHROMIC 3 0 SH 27 (SUTURE) IMPLANT
SUT PDS AB 1 TP1 54 (SUTURE) ×6 IMPLANT
SUT PROLENE 3 0 PS 2 (SUTURE) IMPLANT
SUT SILK 0 FSL (SUTURE) IMPLANT
SUT SILK 2 0 (SUTURE)
SUT SILK 2-0 18XBRD TIE 12 (SUTURE) IMPLANT
SUT SILK 3 0 (SUTURE) ×1
SUT SILK 3-0 (SUTURE) IMPLANT
SUT SILK 3-0 18XBRD TIE 12 (SUTURE) ×2 IMPLANT
SUT VIC AB 3-0 SH 27 (SUTURE) ×4
SUT VIC AB 3-0 SH 27X BRD (SUTURE) ×8 IMPLANT
SYR 20ML LL LF (SYRINGE) ×6 IMPLANT
SYR BULB IRRIG 60ML STRL (SYRINGE) ×3 IMPLANT
TOWEL OR 17X26 4PK STRL BLUE (TOWEL DISPOSABLE) ×3 IMPLANT
TRAY FOLEY MTR SLVR 16FR STAT (SET/KITS/TRAYS/PACK) ×3 IMPLANT

## 2019-10-30 NOTE — ED Notes (Signed)
Cultures collected by lab

## 2019-10-30 NOTE — ED Notes (Signed)
Multiple RN attempt stick for blood cultures. Lab contacted to collect cultures. Per Dr Kerman Passey, start antibiotics if delay in getting cultures

## 2019-10-30 NOTE — Anesthesia Procedure Notes (Signed)
Arterial Line Insertion Start/End7/02/2020 7:45 PM Performed by: Tera Mater, MD, Luke Rigsbee, Einar Grad, CRNA, anesthesiologist  Patient location: Pre-op. Preanesthetic checklist: patient identified, IV checked, site marked, risks and benefits discussed, surgical consent, monitors and equipment checked, pre-op evaluation, timeout performed and anesthesia consent Lidocaine 1% used for infiltration radial was placed Catheter size: 20 Fr Hand hygiene performed  and maximum sterile barriers used   Attempts: 1 Procedure performed using ultrasound guided technique. Following insertion, dressing applied. Post procedure assessment: normal and unchanged

## 2019-10-30 NOTE — Consult Note (Signed)
Name: Kristina Wright MRN: [id number] DOB: 1955-06-08    ADMISSION DATE:  10/30/2019 CONSULTATION DATE: 10/30/2019  REFERRING MD : Dr. Lysle Pearl  CHIEF COMPLAINT: Diarrhea   BRIEF PATIENT DESCRIPTION:  64 yo female admitted with bowel perforation s/p exploratory laparotomy requiring bowel resection along with colostomy and remained mechanically intubated postop  SIGNIFICANT EVENTS/STUDIES:  07/11: CT Abd Pelvis revealed bowel perforation with small to moderate amount of pneumoperitoneum within the abdomen and pelvis. Pneumoperitoneum is centered adjacent to mildly thickened sigmoid colon, most likely representing perforation of the sigmoid colon, of uncertain etiology. Small amount of free fluid in the pelvis. No discrete abscess. Aortic Atherosclerosis (ICD10-I70.0). General Surgery consulted pt emergently transported for emergent exploratory laparotomy  07/11: Pt admitted to ICU mechanically intubated postop   HISTORY OF PRESENT ILLNESS:   This is a 64 yo female with a PMH of Hypothyroidism, Stage IV Malignant Neoplasm of the Lung with mets to the brain, HTN, HLD, Depression, and Cervical Cancer (dx 1989).  She presented to [facility] ER on 07/11 via EMS from home with diarrhea and RLQ pain onset 9 days prior to ER presentation.  Per ER notes pt also reported she had not had any PO intake along with inability to get out of bed for 2 days.  Lab results revealed Na+ 126, chloride 93, glucose 172, BUN 43, creatinine 1.38, calcium 8.3, albumin 2.2, CK 33, troponin 22, wbc 18.5, and hgb 11.2.  CT Abd Pelvis revealed bowel perforation with small to moderate amount of pneumoperitoneum within the abdomen and pelvis.  Due to abnormal CT findings surgical team consulted and pt transported for emergent exploratory laparotomy requiring bowel resection and colostomy.  Pt admitted to ICU mechanically intubated postop.  PAST MEDICAL HISTORY :   has a past medical history of Cancer ([place]) (1989), Depression, Family  history of breast cancer, Family history of colon cancer, Family history of ovarian cancer, Family history of stomach cancer, Family history of throat cancer, Hyperlipidemia, Hypertension, Malignant neoplasm of lung ([place]) (03/01/2018), and Thyroid disease.  has a past surgical history that includes Breast cyst aspiration (Left); Partial hysterectomy; Tubal ligation; Bilateral carpal tunnel release; Lung biopsy; and PORTA CATH INSERTION (N/A, 03/03/2018). Prior to Admission medications   Medication Sig Start Date End Date Taking? Authorizing Provider  ALPRAZolam (XANAX) 0.25 MG tablet Take 1 tablet (0.25 mg total) by mouth at bedtime as needed. for anxiety AS NEEDED ONLY! 09/23/19  Yes Juline Patch, MD  aspirin EC 81 MG tablet Take 81 mg by mouth daily.   Yes [provider]  atorvastatin (LIPITOR) 10 MG tablet Take 1 tablet (10 mg total) by mouth daily. 08/01/19  Yes Juline Patch, MD  busPIRone (BUSPAR) 5 MG tablet Take 1 tablet (5 mg total) by mouth daily. 08/01/19  Yes Juline Patch, MD  clopidogrel (PLAVIX) 75 MG tablet Take 1 tablet (75 mg total) by mouth daily. 09/16/19  Yes Juline Patch, MD  dexamethasone (DECADRON) 1 MG tablet Take 2 tablets (2 mg total) by mouth as directed. Take 2 tablets (52m total) by mouth 2 times a day for 1 week , then 2 tabs (224m daily for 1 week 10/20/19  Yes YuEarlie ServerMD  FLUoxetine (PROZAC) 10 MG capsule Take 1 capsule (10 mg total) by mouth daily. 08/01/19  Yes JoJuline PatchMD  furosemide (LASIX) 20 MG tablet Take 1 tablet (20 mg total) by mouth 2 (two) times daily. 10/10/19  Yes JoJuline PatchMD  levETIRAcetam (  KEPPRA) 500 MG tablet Take 1 tablet (500 mg total) by mouth 2 (two) times daily. 09/30/19  Yes Earlie Server, MD  levothyroxine (SYNTHROID) 112 MCG tablet Take 1 tablet (112 mcg total) by mouth daily. 08/01/19  Yes Juline Patch, MD  lidocaine (XYLOCAINE) 2 % solution Use as directed 15 mLs in the mouth or throat as needed for mouth pain.  09/15/19  Yes Jacquelin Hawking, NP  lidocaine-prilocaine (EMLA) cream Apply 1 application topically as needed.   Yes [provider]  lisinopril-hydrochlorothiazide (ZESTORETIC) 20-25 MG tablet Take 1 tablet by mouth daily. 09/01/19  Yes Juline Patch, MD  Multiple Vitamins-Minerals (MULTIVITAMIN ADULT PO) Take 1 tablet by mouth daily.    Yes [provider]  mupirocin ointment (BACTROBAN) 2 % Apply 1 application topically 2 (two) times daily. Patient taking differently: Apply 1 application topically 2 (two) times daily. (apply to lower legs and feet) 10/10/19  Yes Juline Patch, MD  nystatin cream (MYCOSTATIN) Apply 1 application topically 2 (two) times daily. Patient taking differently: Apply 1 application topically 2 (two) times daily. (apply to lower legs and feet) 10/10/19  Yes Earlie Server, MD  Olopatadine HCl 0.2 % SOLN Apply 1 drop to eye in the morning and at bedtime. 09/26/19  Yes Juline Patch, MD  omeprazole (PRILOSEC) 20 MG capsule Take 1 capsule (20 mg total) by mouth daily. 10/07/19  Yes Earlie Server, MD  osimertinib mesylate (TAGRISSO) 80 MG tablet Take 1 tablet (80 mg total) by mouth daily. 09/15/19  Yes Corcoran, Drue Second, MD  oxyCODONE (OXY IR/ROXICODONE) 5 MG immediate release tablet Take 1 tablet (5 mg total) by mouth every 6 (six) hours as needed for severe pain. 10/19/19  Yes Burns, Wandra Feinstein, NP  umeclidinium-vilanterol (ANORO ELLIPTA) 62.5-25 MCG/INH AEPB Inhale 1 puff into the lungs daily. 08/01/19  Yes Juline Patch, MD  valACYclovir (VALTREX) 1000 MG tablet Take 1 tablet (1,000 mg total) by mouth 3 (three) times daily. 09/08/19  Yes Corcoran, Drue Second, MD  VENTOLIN HFA 108 (90 Base) MCG/ACT inhaler Inhale 1-2 puffs into the lungs every 6 (six) hours as needed for wheezing or shortness of breath. 08/01/19  Yes Juline Patch, MD  prochlorperazine (COMPAZINE) 10 MG tablet Take 1 tablet (10 mg total) by mouth every 6 (six) hours as needed (Nausea or  vomiting). Patient not taking: Reported on 03/03/2019 03/01/18 04/25/19  Earlie Server, MD   No Known Allergies  FAMILY HISTORY:  family history includes Breast cancer (age of onset: 24) in her sister; Colon cancer (age of onset: 52) in her maternal grandmother and mother; Heart attack in her maternal aunt; Hypertension in her father; Ovarian cancer (age of onset: 27) in her maternal aunt; Stomach cancer in her paternal grandmother; Throat cancer in her brother and brother. SOCIAL HISTORY:  reports that she quit smoking about 1 years ago. Her smoking use included cigarettes. She has a 25.00 pack-year smoking history. She has never used smokeless tobacco. She reports current drug use. Drug: Marijuana. She reports that she does not drink alcohol.  REVIEW OF SYSTEMS:   Unable to assess pt mechanically intubated   SUBJECTIVE:  Unable to assess pt mechanically intubated   VITAL SIGNS: Temp:  [97.9 F (36.6 C)] 97.9 F (36.6 C) (07/11 1451) Pulse Rate:  [116-121] 116 (07/11 1830) Resp:  [16-26] 20 (07/11 1830) BP: (120-164)/(71-93) 164/90 (07/11 1830) SpO2:  [98 %-100 %] 98 % (07/11 1830) Weight:  [103 kg] 103 kg (07/11 1452)  PHYSICAL EXAMINATION: General: acutely ill appearing female, NAD mechanically intubated  Neuro: sedated, not following commands, PERRL HEENT: supple, no JVD  Cardiovascular: nsr, rrr, no R/G  Lungs: clear throughout, even, non labored  Abdomen: no audible BS, soft, slightly distended, LUQ colostomy present device intact  Musculoskeletal: no edema  Skin: bilateral lower extremity venous stasis ulcer with foul smelling odor, midline abdominal incision dressing dry/intact, LUQ colostomy device clean/intact, and right quadrant JP drain intact with scant amount of serosanguinous drainage  Recent Labs  Lab 10/30/19 1508  NA 126*  K 4.5  CL 93*  CO2 22  BUN 43*  CREATININE 1.38*  GLUCOSE 172*   Recent Labs  Lab 10/30/19 1508  HGB 11.2*  HCT 32.8*  WBC 18.5*   PLT 172   CT ABDOMEN PELVIS W CONTRAST  Result Date: 10/30/2019 CLINICAL DATA:  64 year old female with diffuse abdominal and pelvic pain. History of metastatic lung cancer. EXAM: CT ABDOMEN AND PELVIS WITH CONTRAST TECHNIQUE: Multidetector CT imaging of the abdomen and pelvis was performed using the standard protocol following bolus administration of intravenous contrast. CONTRAST:  29m OMNIPAQUE IOHEXOL 300 MG/ML  SOLN COMPARISON:  08/09/2019 FINDINGS: Lower chest: No acute abnormality. Hepatobiliary: The liver and gallbladder are unremarkable. No biliary dilatation. Pancreas: Unremarkable Spleen: Unremarkable Adrenals/Urinary Tract: The kidneys, adrenal glands and bladder are unremarkable. Stomach/Bowel: A small to moderate amount of pneumoperitoneum is present within the abdomen and pelvis, and centered adjacent to the sigmoid colon, which exhibits mild wall thickening. Colonic diverticulosis is noted. No other bowel abnormalities are identified. The appendix is unremarkable. Vascular/Lymphatic: Aortic atherosclerosis. No enlarged abdominal or pelvic lymph nodes. Reproductive: Uterus and bilateral adnexa are unremarkable. Other: A small amount free fluid in the pelvis is noted. No discrete abscess is present. Musculoskeletal: No acute or suspicious bony abnormalities are noted. IMPRESSION: 1. Bowel perforation with small to moderate amount of pneumoperitoneum within the abdomen and pelvis. Pneumoperitoneum is centered adjacent to mildly thickened sigmoid colon, most likely representing perforation of the sigmoid colon, of uncertain etiology. Small amount of free fluid in the pelvis. No discrete abscess. 2. Aortic Atherosclerosis (ICD10-I70.0). Critical Value/emergent results were called by telephone at the time of interpretation on 10/30/2019 at 4:40 pm to provider ALaban Emperor who verbally acknowledged these results. Electronically Signed   By: JMargarette CanadaM.D.   On: 10/30/2019 16:42    ASSESSMENT /  PLAN:  Bowel perforation s/p exploratory laparotomy requiring bowel resection along with colostomy IntraOp Mechanical Intubation  Hx: Stage IV lung adenocarcinoma with mets to the brain Full vent support for now-vent settings reviewed and established SBT once all parameters met  VAP bundle implemented Prn bronchodilator therapy   HTN Hx: HLD Continuous telemetry monitoring  Prn hydralazine for bp management   Acute renal failure and hyponatremia likely secondary to hypovolemia  Trend BMP  Replace electrolytes as indicated  Monitor UOP Avoid nephrotoxic medications  Continue NS @100  ml/hr   Leukocytosis secondary to bowel perforation  Trend WBC and monitor fever curve  Trend PCT  Follow cultures  Continue zosyn   Hyperglycemia  CBG's q4hrs  SSI  Hemoglobin A1c pending   Mechanical Intubation Pain/Discomfort  Postop pain  Hx: Stage IV lung adenocarcinoma with mets to the brain  Maintain RASS goal -1 to -2 Propofol and fentanyl gtts to maintain RASS goal and for pain management  WUA daily  Will start outpatient keppra dose   Best Practice: VTE px: SCD's, avoid chemical prophylaxis for now SUP px: iv pepcid  Diet: keep NPO for now will defer to surgical team  Code status: Full code  Family: updated pts daughter via telephone and all questions were answered  Marda Stalker, [place] Pager [phone] (please enter 7 digits) PCCM Consult Pager [phone] (please enter 7 digits)

## 2019-10-30 NOTE — Progress Notes (Signed)
Pharmacy Antibiotic Note  Kristina Wright is a 64 y.o. female admitted on 10/30/2019 with IAI.  Pharmacy has been consulted for zosyn dosing.  Plan: Zosyn 3.375g IV q8h (4 hour infusion).  Height: 5\' 9"  (175.3 cm) Weight: 103 kg (227 lb 1.2 oz) IBW/kg (Calculated) : 66.2  Temp (24hrs), Avg:97.2 F (36.2 C), Min:96.4 F (35.8 C), Max:97.9 F (36.6 C)  Recent Labs  Lab 10/30/19 1508  WBC 18.5*  CREATININE 1.38*    Estimated Creatinine Clearance: 54.7 mL/min (A) (by C-G formula based on SCr of 1.38 mg/dL (H)).    No Known Allergies  Antimicrobials this admission:   >>    >>   Dose adjustments this admission:   Microbiology results:  BCx:   UCx:    Sputum:    MRSA PCR:   Thank you for allowing pharmacy to be a part of this patient's care.  Hart Robinsons A 10/30/2019 11:53 PM

## 2019-10-30 NOTE — ED Notes (Signed)
Lab at bedside to collect cultures

## 2019-10-30 NOTE — Anesthesia Preprocedure Evaluation (Addendum)
Anesthesia Evaluation  Patient identified by MRN, date of birth, ID band Patient awake    Reviewed: Allergy & Precautions, H&P , NPO status , Patient's Chart, lab work & pertinent test results  Airway Mallampati: III  TM Distance: >3 FB Neck ROM: full    Dental  (+) Chipped   Pulmonary shortness of breath, COPD, former smoker,     + decreased breath sounds      Cardiovascular hypertension, (-) angina+ Peripheral Vascular Disease (h/o critical foot ischemia, on ASA/Plavix)  (-) Past MI and (-) Cardiac Stents (-) dysrhythmias  Rhythm:regular Rate:Tachycardia     Neuro/Psych PSYCHIATRIC DISORDERS Anxiety Depression CVA (asymmetrical mouth movements), Residual Symptoms    GI/Hepatic negative GI ROS, Neg liver ROS,   Endo/Other  Hypothyroidism   Renal/GU      Musculoskeletal   Abdominal   Peds  Hematology anticoagulated   Anesthesia Other Findings Appears chronically ill Metastatic cancer, taking steroids  Past Medical History: 1989: Cancer ([place])     Comment:  cervical No date: Depression No date: Family history of breast cancer No date: Family history of colon cancer No date: Family history of ovarian cancer No date: Family history of stomach cancer No date: Family history of throat cancer No date: Hyperlipidemia No date: Hypertension 03/01/2018: Malignant neoplasm of lung ([place]) No date: Thyroid disease  Past Surgical History: No date: BILATERAL CARPAL TUNNEL RELEASE No date: BREAST CYST ASPIRATION; Left No date: LUNG BIOPSY No date: PARTIAL HYSTERECTOMY 03/03/2018: PORTA CATH INSERTION; N/A     Comment:  Procedure: PORTA CATH INSERTION;  Surgeon: Katha Cabal, MD;  Location: [facility];  Service:              Cardiovascular;  Laterality: N/A; No date: TUBAL LIGATION  BMI    Body Mass Index: 33.53 kg/m      Reproductive/Obstetrics negative OB ROS                             Anesthesia Physical Anesthesia Plan  ASA: IV and emergent  Anesthesia Plan: General ETT and Rapid Sequence   Post-op Pain Management:    Induction:   PONV Risk Score and Plan: Ondansetron, Dexamethasone and Treatment may vary due to age or medical condition  Airway Management Planned:   Additional Equipment:   Intra-op Plan:   Post-operative Plan:   Informed Consent: I have reviewed the patients History and Physical, chart, labs and discussed the procedure including the risks, benefits and alternatives for the proposed anesthesia with the patient or authorized representative who has indicated his/her understanding and acceptance.     Dental Advisory Given  Plan Discussed with: Anesthesiologist, CRNA and Surgeon  Anesthesia Plan Comments:         Anesthesia Quick Evaluation

## 2019-10-30 NOTE — ED Notes (Signed)
Lab at bedside

## 2019-10-30 NOTE — ED Notes (Addendum)
Silver ring, yellow ring, silver belly button ring, glasses, cellphone and clothes given to daughter susan.  NO PURSE OR WALLET ON ARRIVAL

## 2019-10-30 NOTE — H&P (Signed)
Subjective:   CC: bowel perf  HPI:  Kristina Wright is a 64 y.o. female who was consulted by [facility] for issue above.  Symptoms were first noted several days ago. Pain is sharp, diffuse around abdomen.  Associated with diarrhea, exacerbated by nothing specific     Past Medical History:  has a past medical history of Cancer ([place]) (1989), Depression, Family history of breast cancer, Family history of colon cancer, Family history of ovarian cancer, Family history of stomach cancer, Family history of throat cancer, Hyperlipidemia, Hypertension, Malignant neoplasm of lung ([place]) (03/01/2018), and Thyroid disease.  Past Surgical History:  Past Surgical History:  Procedure Laterality Date  . BILATERAL CARPAL TUNNEL RELEASE    . BREAST CYST ASPIRATION Left   . LUNG BIOPSY    . PARTIAL HYSTERECTOMY    . PORTA CATH INSERTION N/A 03/03/2018   Procedure: PORTA CATH INSERTION;  Surgeon: Katha Cabal, MD;  Location: [facility] [place] [facility];  Service: Cardiovascular;  Laterality: N/A;  . TUBAL LIGATION      Family History: family history includes Breast cancer (age of onset: 70) in her sister; Colon cancer (age of onset: 48) in her maternal grandmother and mother; Heart attack in her maternal aunt; Hypertension in her father; Ovarian cancer (age of onset: 50) in her maternal aunt; Stomach cancer in her paternal grandmother; Throat cancer in her brother and brother.  Social History:  reports that she quit smoking about 1 years ago. Her smoking use included cigarettes. She has a 25.00 pack-year smoking history. She has never used smokeless tobacco. She reports current drug use. Drug: Marijuana. She reports that she does not drink alcohol.  Current Medications:  ALPRAZolam (XANAX) 0.25 MG tablet Take 1 tablet (0.25 mg total) by mouth at bedtime as needed. for anxiety AS NEEDED ONLY! Lysle Pearl, Zalia Hautala, DO Not Ordered  amoxicillin-clavulanate (AUGMENTIN) 875-125 MG tablet Take 1 tablet by mouth 2 (two) times  daily. Benjamine Sprague, DO Not Ordered  aspirin EC 81 MG tablet Take 81 mg by mouth daily. Lysle Pearl, Tyishia Aune, DO Not Ordered  atorvastatin (LIPITOR) 10 MG tablet Take 1 tablet (10 mg total) by mouth daily. Lysle Pearl, Billy Turvey, DO Not Ordered  busPIRone (BUSPAR) 5 MG tablet Take 1 tablet (5 mg total) by mouth daily. Lysle Pearl, Yanel Dombrosky, DO Not Ordered  chlorhexidine (PERIDEX) 0.12 % solution Place 15-40ml of solution in warm water and soak fingers QID. Benjamine Sprague, DO Not Ordered  clopidogrel (PLAVIX) 75 MG tablet Take 1 tablet (75 mg total) by mouth daily. Lysle Pearl, Samy Ryner, DO Not Ordered  dexamethasone (DECADRON) 1 MG tablet Take 2 tablets (2 mg total) by mouth as directed. Take 2 tablets (2mg  total) by mouth 2 times a day for 1 week , then 2 tabs (2mg ) daily for 1 week Kinnie Kaupp, DO Not Ordered  dexamethasone (DECADRON) 4 MG tablet Take 1 tablet (4 mg total) by mouth 2 (two) times daily. Benjamine Sprague, DO Not Ordered   Patient not taking: Reported on 10/17/2019    FLUoxetine (PROZAC) 10 MG capsule Take 1 capsule (10 mg total) by mouth daily. Lysle Pearl, Aspasia Rude, DO Not Ordered  furosemide (LASIX) 20 MG tablet Take 1 tablet (20 mg total) by mouth 2 (two) times daily. Lysle Pearl, Lavontay Kirk, DO Not Ordered  gabapentin (NEURONTIN) 300 MG capsule Take 1 capsule (300 mg total) by mouth 2 (two) times daily. Lysle Pearl, Mckenleigh Tarlton, DO Not Ordered  hydrOXYzine (ATARAX/VISTARIL) 10 MG tablet Take 1 tablet (10 mg total) by mouth daily. Benjamine Sprague, DO Not Ordered  Patient not taking: Reported on 10/20/2019    levETIRAcetam (KEPPRA) 500 MG tablet Take 1 tablet (500 mg total) by mouth 2 (two) times daily. Benjamine Sprague, DO Not Ordered  levothyroxine (SYNTHROID) 112 MCG tablet Take 1 tablet (112 mcg total) by mouth daily. Lysle Pearl, Lucilla Petrenko, DO Not Ordered  lidocaine (XYLOCAINE) 2 % solution Use as directed 15 mLs in the mouth or throat as needed for mouth pain. Lysle Pearl, Daurice Ovando, DO Not Ordered  lidocaine-prilocaine (EMLA) cream Apply 1 application topically as needed.  Lysle Pearl, Kamea Dacosta, DO Not Ordered  lisinopril-hydrochlorothiazide (ZESTORETIC) 20-25 MG tablet Take 1 tablet by mouth daily. Benjamine Sprague, DO Not Ordered  Multiple Vitamins-Minerals (MULTIVITAMIN ADULT PO) Take 1 tablet by mouth daily.  Lysle Pearl, Soffia Doshier, DO Not Ordered  mupirocin ointment (BACTROBAN) 2 % Apply 1 application topically 2 (two) times daily. Lysle Pearl, Simmie Camerer, DO Not Ordered  nystatin cream (MYCOSTATIN) Apply 1 application topically 2 (two) times daily. Lysle Pearl, Breiana Stratmann, DO Not Ordered  Olopatadine HCl 0.2 % SOLN Apply 1 drop to eye in the morning and at bedtime. Lysle Pearl, Sahira Cataldi, DO Not Ordered  omeprazole (PRILOSEC) 20 MG capsule Take 1 capsule (20 mg total) by mouth daily. Lysle Pearl, Jett Kulzer, DO Not Ordered  osimertinib mesylate (TAGRISSO) 80 MG tablet Take 1 tablet (80 mg total) by mouth daily. Lysle Pearl, Bijon Mineer, DO Not Ordered  oxyCODONE (OXY IR/ROXICODONE) 5 MG immediate release tablet Take 1 tablet (5 mg total) by mouth every 6 (six) hours as needed for severe pain. Lysle Pearl, Paiten Boies, DO Not Ordered  umeclidinium-vilanterol (ANORO ELLIPTA) 62.5-25 MCG/INH AEPB Inhale 1 puff into the lungs daily. Lysle Pearl, Infantof Villagomez, DO Not Ordered  valACYclovir (VALTREX) 1000 MG tablet Take 1 tablet (1,000 mg total) by mouth 3 (three) times daily. Lysle Pearl, Kama Cammarano, DO Not Ordered  VENTOLIN HFA 108 (90 Base) MCG/ACT inhaler Inhale 1-2 puffs into the lungs every 6 (six) hours as needed for wheezing or shortness of breath. Benjamine Sprague, DO Not Ordered     Allergies:  Allergies as of 10/30/2019  . (No Known Allergies)    ROS:  General: Denies weight loss, weight gain, fatigue, fevers, chills, and night sweats. Eyes: Denies blurry vision, double vision, eye pain, itchy eyes, and tearing. Ears: Denies hearing loss, earache, and ringing in ears. Nose: Denies sinus pain, congestion, infections, runny nose, and nosebleeds. Mouth/throat: Denies hoarseness, sore throat, bleeding gums, and difficulty swallowing. Heart: Denies chest pain,  palpitations, racing heart, irregular heartbeat, leg pain or swelling, and decreased activity tolerance. Respiratory: Denies breathing difficulty, shortness of breath, wheezing, cough, and sputum. GI: Denies change in appetite, heartburn, nausea, vomiting, constipation, and blood in stool. GU: Denies difficulty urinating, pain with urinating, urgency, frequency, blood in urine. Musculoskeletal: Denies joint stiffness, pain, swelling, muscle weakness. Skin: Denies rash, itching, mass, tumors, sores, and boils Neurologic: Denies headache, fainting, dizziness, seizures, numbness, and tingling. Psychiatric: Denies depression, anxiety, difficulty sleeping, and memory loss. Endocrine: Denies heat or cold intolerance, and increased thirst or urination. Blood/lymph: Denies easy bruising, easy bruising, and swollen glands     Objective:     BP 120/71   Pulse (!) 121   Temp 97.9 F (36.6 C) (Oral)   Resp (!) 24   Ht 5\' 9"  (1.753 m)   Wt 103 kg   SpO2 100%   BMI 33.53 kg/m   Constitutional :  alert, cooperative, appears stated age and no distress  Lymphatics/Throat:  no asymmetry, masses, or scars  Respiratory:  clear to auscultation bilaterally  Cardiovascular:  regular rate and rhythm  Gastrointestinal:  diffuse TTP and guarding, consistent with acute abdomen.   Musculoskeletal: Steady movement  Skin: Cool and moist   Psychiatric: Normal affect, non-agitated, not confused       LABS:  CMP Latest Ref Rng & Units 10/30/2019 10/20/2019 10/07/2019  Glucose 70 - 99 mg/dL 172(H) 208(H) 129(H)  BUN 8 - 23 mg/dL 43(H) 35(H) 27(H)  Creatinine 0.44 - 1.00 mg/dL 1.38(H) 1.09(H) 0.90  Sodium 135 - 145 mmol/L 126(L) 129(L) 138  Potassium 3.5 - 5.1 mmol/L 4.5 4.0 4.1  Chloride 98 - 111 mmol/L 93(L) 94(L) 100  CO2 22 - 32 mmol/L 22 23 28   Calcium 8.9 - 10.3 mg/dL 8.3(L) 8.6(L) 8.7(L)  Total Protein 6.5 - 8.1 g/dL 6.7 6.9 6.8  Total Bilirubin 0.3 - 1.2 mg/dL 0.9 0.6 0.5  Alkaline Phos 38 - 126 U/L  100 70 68  AST 15 - 41 U/L 26 17 18   ALT 0 - 44 U/L 43 29 25   CBC Latest Ref Rng & Units 10/30/2019 10/20/2019 10/07/2019  WBC 4.0 - 10.5 K/uL 18.5(H) 20.8(H) 14.9(H)  Hemoglobin 12.0 - 15.0 g/dL 11.2(L) 12.8 11.4(L)  Hematocrit 36 - 46 % 32.8(L) 37.7 34.2(L)  Platelets 150 - 400 K/uL 172 268 333    RADS: CLINICAL DATA:  64 year old female with diffuse abdominal and pelvic pain. History of metastatic lung cancer.  EXAM: CT ABDOMEN AND PELVIS WITH CONTRAST  TECHNIQUE: Multidetector CT imaging of the abdomen and pelvis was performed using the standard protocol following bolus administration of intravenous contrast.  CONTRAST:  41mL OMNIPAQUE IOHEXOL 300 MG/ML  SOLN  COMPARISON:  08/09/2019  FINDINGS: Lower chest: No acute abnormality.  Hepatobiliary: The liver and gallbladder are unremarkable. No biliary dilatation.  Pancreas: Unremarkable  Spleen: Unremarkable  Adrenals/Urinary Tract: The kidneys, adrenal glands and bladder are unremarkable.  Stomach/Bowel: A small to moderate amount of pneumoperitoneum is present within the abdomen and pelvis, and centered adjacent to the sigmoid colon, which exhibits mild wall thickening. Colonic diverticulosis is noted. No other bowel abnormalities are identified.  The appendix is unremarkable.  Vascular/Lymphatic: Aortic atherosclerosis. No enlarged abdominal or pelvic lymph nodes.  Reproductive: Uterus and bilateral adnexa are unremarkable.  Other: A small amount free fluid in the pelvis is noted. No discrete abscess is present.  Musculoskeletal: No acute or suspicious bony abnormalities are noted.  IMPRESSION: 1. Bowel perforation with small to moderate amount of pneumoperitoneum within the abdomen and pelvis. Pneumoperitoneum is centered adjacent to mildly thickened sigmoid colon, most likely representing perforation of the sigmoid colon, of uncertain etiology. Small amount of free fluid in the  pelvis. No discrete abscess. 2. Aortic Atherosclerosis (ICD10-I70.0).  Critical Value/emergent results were called by telephone at the time of interpretation on 10/30/2019 at 4:40 pm to provider Laban Emperor, who verbally acknowledged these results.   Electronically Signed   By: Margarette Canada M.D.   On: 10/30/2019 16:42  Assessment:   Perforated bowel  Plan:     Recommended emergent surgery, secondary to tachycardia, diffuse abdominal pain on exam, and CT noting of moderate pneumoperitoneum.  CT reviewed and will likely bowel resection of perfed  Bowel, sigmoid area with diverticulosis and increased haziness on images.  The risk of surgery include, but not limited to, recurrence, bleeding, chronic pain, post-op infxn, post-op SBO or ileus, hernias, resection of bowel, re-anastamosis, likely ostomy placement and need for re-operation to address said risks. The risks of general anesthetic, if used, includes MI, CVA, sudden death or even reaction to anesthetic medications also  discussed. Alternatives include continued observation and abx, but will likely fail in her case.  Benefits include possible symptom relief, preventing further decline in health and possible death.  Typical post-op recovery time of additional days in hospital for observation afterwards also discussed. Pt is high risk of perioperative complications secondary to medical issues and use of plavix, aspirin, and steroids. She also has AKI and hyponatremia.  The patient verbalized understanding and all questions were answered to the patient's satisfaction.  To OR emergently.  COVID test pending, plts ordered for plavix reversal.  POA is daughter who was at bedside and she is agreeable to plan.  Pt requested to avoid ostomy if possible, but no guarantees could be made, and explained to her that it maybe a likely outcome in her case.

## 2019-10-30 NOTE — Anesthesia Procedure Notes (Signed)
Procedure Name: Intubation Date/Time: 10/30/2019 7:30 PM Performed by: Chanetta Marshall, CRNA Pre-anesthesia Checklist: Patient identified, Emergency Drugs available, Suction available and Patient being monitored Patient Re-evaluated:Patient Re-evaluated prior to induction Oxygen Delivery Method: Circle system utilized Preoxygenation: Pre-oxygenation with 100% oxygen Induction Type: IV induction Ventilation: Mask ventilation without difficulty Laryngoscope Size: McGraph and 3 Grade View: Grade I Tube type: Oral Tube size: 7.0 mm Number of attempts: 1 Airway Equipment and Method: Stylet and Video-laryngoscopy Placement Confirmation: ETT inserted through vocal cords under direct vision,  positive ETCO2,  breath sounds checked- equal and bilateral and CO2 detector Secured at: 21 cm Tube secured with: Tape Dental Injury: Teeth and Oropharynx as per pre-operative assessment

## 2019-10-30 NOTE — ED Provider Notes (Signed)
[facility] Emergency Department Provider Note  Time seen: 3:08 PM  I have reviewed the triage vital signs and the nursing notes.   HISTORY  Chief Complaint Diarrhea   HPI Kristina [facility] is a 64 y.o. female with a past medical history of hypertension, hyperlipidemia, lung cancer with brain metastases presents to the emergency department for generalized weakness and diarrhea.  According to the patient she is on daily oral chemotherapy.  States for the past 9 to 10 days she has had diarrhea and significant weakness.  States she has not been able to get out of bed for the past 2 or 3 days.  Has not been eating or drinking.  Denies nausea or vomiting.  Does state dysuria.  Denies fever.   Past Medical History:  Diagnosis Date  . Cancer ([place]) 1989   cervical  . Depression   . Family history of breast cancer   . Family history of colon cancer   . Family history of ovarian cancer   . Family history of stomach cancer   . Family history of throat cancer   . Hyperlipidemia   . Hypertension   . Malignant neoplasm of lung ([place]) 03/01/2018  . Thyroid disease     Patient Active Problem List   Diagnosis Date Noted  . Bilateral lower extremity edema 10/07/2019  . Herpes zoster without complication 40/98/1191  . Port-A-Cath in place 10/01/2019  . Brain metastasis ([place]) 08/05/2019  . Elevated TSH 06/20/2019  . Chronic obstructive pulmonary disease ([place]) 12/13/2018  . Left lower lobe pulmonary nodule 11/29/2018  . Breast nodule 08/25/2018  . Anxiety 08/22/2018  . Genetic testing 06/11/2018  . Family history of breast cancer   . Family history of colon cancer   . Family history of ovarian cancer   . Family history of stomach cancer   . Family history of throat cancer   . Metastatic adenocarcinoma to lung with unknown primary site [facility]) 04/27/2018  . Depression 03/12/2018  . Malignant neoplasm of lung ([place]) 03/01/2018  . Goals of care, counseling/discussion  03/01/2018  . Tobacco use disorder 02/08/2016  . Essential hypertension 02/08/2016  . PVD (peripheral vascular disease) ([place]) 02/08/2016  . Blue toe syndrome of right lower extremity ([place]) 02/08/2016  . Adult BMI > 30 07/31/2015  . High risk medication use 07/31/2015  . Carpal tunnel syndrome on both sides 10/30/2014  . Postmenopause atrophic vaginitis 05/15/2014  . Sensory urge incontinence 05/15/2014  . Vitamin D deficiency 04/25/2014  . Extremity pain 11/22/2013  . Numbness 11/22/2013  . Sleep disorder 11/22/2013  . Chronic insomnia 11/10/2013  . Foot pain, left 11/10/2013  . Hypothyroidism 11/10/2013  . Mixed hyperlipidemia 11/10/2013    Past Surgical History:  Procedure Laterality Date  . BILATERAL CARPAL TUNNEL RELEASE    . BREAST CYST ASPIRATION Left   . LUNG BIOPSY    . PARTIAL HYSTERECTOMY    . PORTA CATH INSERTION N/A 03/03/2018   Procedure: PORTA CATH INSERTION;  Surgeon: Katha Cabal, MD;  Location: [facility];  Service: Cardiovascular;  Laterality: N/A;  . TUBAL LIGATION      Prior to Admission medications   Medication Sig Start Date End Date Taking? Authorizing Provider  ALPRAZolam (XANAX) 0.25 MG tablet Take 1 tablet (0.25 mg total) by mouth at bedtime as needed. for anxiety AS NEEDED ONLY! 09/23/19   Juline Patch, MD  amoxicillin-clavulanate (AUGMENTIN) 875-125 MG tablet Take 1 tablet by mouth 2 (two) times daily. 10/10/19  Juline Patch, MD  aspirin EC 81 MG tablet Take 81 mg by mouth daily.    [provider]  atorvastatin (LIPITOR) 10 MG tablet Take 1 tablet (10 mg total) by mouth daily. 08/01/19   Juline Patch, MD  busPIRone (BUSPAR) 5 MG tablet Take 1 tablet (5 mg total) by mouth daily. 08/01/19   Juline Patch, MD  chlorhexidine (PERIDEX) 0.12 % solution Place 15-105ml of solution in warm water and soak fingers QID. 09/13/19   Jacquelin Hawking, NP  clopidogrel (PLAVIX) 75 MG tablet Take 1 tablet (75 mg total) by mouth daily.  09/16/19   Juline Patch, MD  dexamethasone (DECADRON) 1 MG tablet Take 2 tablets (2 mg total) by mouth as directed. Take 2 tablets (2mg  total) by mouth 2 times a day for 1 week , then 2 tabs (2mg ) daily for 1 week 10/20/19   Earlie Server, MD  dexamethasone (DECADRON) 4 MG tablet Take 1 tablet (4 mg total) by mouth 2 (two) times daily. Patient not taking: Reported on 10/17/2019 09/30/19   Earlie Server, MD  FLUoxetine (PROZAC) 10 MG capsule Take 1 capsule (10 mg total) by mouth daily. 08/01/19   Juline Patch, MD  furosemide (LASIX) 20 MG tablet Take 1 tablet (20 mg total) by mouth 2 (two) times daily. 10/10/19   Juline Patch, MD  gabapentin (NEURONTIN) 300 MG capsule Take 1 capsule (300 mg total) by mouth 2 (two) times daily. 10/20/19   Earlie Server, MD  hydrOXYzine (ATARAX/VISTARIL) 10 MG tablet Take 1 tablet (10 mg total) by mouth daily. Patient not taking: Reported on 10/20/2019 08/01/19   Juline Patch, MD  levETIRAcetam (KEPPRA) 500 MG tablet Take 1 tablet (500 mg total) by mouth 2 (two) times daily. 09/30/19   Earlie Server, MD  levothyroxine (SYNTHROID) 112 MCG tablet Take 1 tablet (112 mcg total) by mouth daily. 08/01/19   Juline Patch, MD  lidocaine (XYLOCAINE) 2 % solution Use as directed 15 mLs in the mouth or throat as needed for mouth pain. 09/15/19   Jacquelin Hawking, NP  lidocaine-prilocaine (EMLA) cream Apply 1 application topically as needed.    [provider]  lisinopril-hydrochlorothiazide (ZESTORETIC) 20-25 MG tablet Take 1 tablet by mouth daily. 09/01/19   Juline Patch, MD  Multiple Vitamins-Minerals (MULTIVITAMIN ADULT PO) Take 1 tablet by mouth daily.     [provider]  mupirocin ointment (BACTROBAN) 2 % Apply 1 application topically 2 (two) times daily. 10/10/19   Juline Patch, MD  nystatin cream (MYCOSTATIN) Apply 1 application topically 2 (two) times daily. 10/10/19   Earlie Server, MD  Olopatadine HCl 0.2 % SOLN Apply 1 drop to eye in the morning and at bedtime. 09/26/19    Juline Patch, MD  omeprazole (PRILOSEC) 20 MG capsule Take 1 capsule (20 mg total) by mouth daily. 10/07/19   Earlie Server, MD  osimertinib mesylate (TAGRISSO) 80 MG tablet Take 1 tablet (80 mg total) by mouth daily. 09/15/19   Lequita Asal, MD  oxyCODONE (OXY IR/ROXICODONE) 5 MG immediate release tablet Take 1 tablet (5 mg total) by mouth every 6 (six) hours as needed for severe pain. 10/19/19   Jacquelin Hawking, NP  umeclidinium-vilanterol (ANORO ELLIPTA) 62.5-25 MCG/INH AEPB Inhale 1 puff into the lungs daily. 08/01/19   Juline Patch, MD  valACYclovir (VALTREX) 1000 MG tablet Take 1 tablet (1,000 mg total) by mouth 3 (three) times daily. 09/08/19   Nolon Stalls  C, MD  VENTOLIN HFA 108 (90 Base) MCG/ACT inhaler Inhale 1-2 puffs into the lungs every 6 (six) hours as needed for wheezing or shortness of breath. 08/01/19   Juline Patch, MD  prochlorperazine (COMPAZINE) 10 MG tablet Take 1 tablet (10 mg total) by mouth every 6 (six) hours as needed (Nausea or vomiting). Patient not taking: Reported on 03/03/2019 03/01/18 04/25/19  Earlie Server, MD    No Known Allergies  Family History  Problem Relation Age of Onset  . Colon cancer Mother 39  . Colon cancer Maternal Grandmother 78  . Hypertension Father   . Breast cancer Sister 40  . Throat cancer Brother   . Heart attack Maternal Aunt   . Ovarian cancer Maternal Aunt 70  . Stomach cancer Paternal Grandmother        dx >50  . Throat cancer Brother     Social History Social History   Tobacco Use  . Smoking status: Former Smoker    Packs/day: 1.00    Years: 25.00    Pack years: 25.00    Types: Cigarettes    Quit date: 11/19/2017    Years since quitting: 1.9  . Smokeless tobacco: Never Used  Vaping Use  . Vaping Use: Never used  Substance Use Topics  . Alcohol use: No  . Drug use: Yes    Types: Marijuana    Comment: occasional    Review of Systems Constitutional: Negative for fever.  Positive for generalized  weakness Cardiovascular: Negative for chest pain. Respiratory: Negative for shortness of breath.  Negative for cough. Gastrointestinal: Moderate dull abdominal pain especially in the lower abdomen states fairly chronic.  Positive for diarrhea x9 or 10 days.  Negative for nausea vomiting. Genitourinary: Positive for dysuria. Musculoskeletal: Negative for musculoskeletal complaints Neurological: Negative for headache All other ROS negative  ____________________________________________   PHYSICAL EXAM:  VITAL SIGNS: ED Triage Vitals  Enc Vitals Group     BP 10/30/19 1451 (!) 143/84     Pulse Rate 10/30/19 1451 (!) 121     Resp 10/30/19 1451 16     Temp 10/30/19 1451 97.9 F (36.6 C)     Temp Source 10/30/19 1451 Oral     SpO2 10/30/19 1451 100 %     Weight 10/30/19 1452 227 lb 1.2 oz (103 kg)     Height 10/30/19 1452 5\' 9"  (1.753 m)     Head Circumference --      Peak Flow --      Pain Score 10/30/19 1452 8     Pain Loc --      Pain Edu? --      Excl. in [place]? --    Constitutional: Alert and oriented. Well appearing and in no distress. Eyes: Normal exam ENT      Head: Normocephalic and atraumatic.      Mouth/Throat: Dry appearing mucous membranes. Cardiovascular: Regular rhythm rate around 120 bpm.  No murmur. Respiratory: Normal respiratory effort without tachypnea nor retractions. Breath sounds are clear  Gastrointestinal: Soft, mild distention, mild diffuse tenderness.  No area of significant tenderness identified. Musculoskeletal: Nontender with normal range of motion in all extremities.  Lower extremity wraps in place.  No significant edema. Neurologic: Slowed speech but normal language. Skin:  Skin is warm, dry and intact.  Psychiatric: Mood and affect are normal.  ____________________________________________    EKG  EKG viewed and interpreted by myself shows sinus tachycardia 120 bpm with a narrow QRS, left axis deviation, largely normal  intervals with nonspecific  ST changes.  ____________________________________________    RADIOLOGY  CT shows small to moderate pneumoperitoneum.  ____________________________________________   INITIAL IMPRESSION / ASSESSMENT AND PLAN / ED COURSE  Pertinent labs & imaging results that were available during my care of the patient were reviewed by me and considered in my medical decision making (see chart for details).   Patient presents emergency department for generalized weakness and 9 to 10 days of diarrhea.  Has not been able to get out of bed for the past 2 or 3 days due to significant weakness.  Patient is tachycardic around 120 bpm has dry mucous membranes.  We will check labs, IV hydrate.  Patient does have mild diffuse abdominal discomfort this may be chronic but given her significant diarrhea and weakness we will proceed with CT imaging of the abdomen/pelvis to further evaluate.  Differential would include dehydration, hypovolemia, metabolic or electrolyte abnormality, infectious etiology such as urinary tract infection colitis or diverticulitis.  CT scan unfortunately shows a mild amount of pneumoperitoneum.  We will start the patient on IV Zosyn and I sent a Covid swab.  I have spoken to surgery Dr. Lysle Pearl who will be in shortly to see the patient.  Patient's lab work shows significant leukocytosis 18,500 also shows hyponatremia 126.  Patient receiving IV fluids receiving IV Zosyn awaiting surgical evaluation.  Kristina [facility] was evaluated in Emergency Department on 10/30/2019 for the symptoms described in the history of present illness. She was evaluated in the context of the global COVID-19 pandemic, which necessitated consideration that the patient might be at risk for infection with the SARS-CoV-2 virus that causes COVID-19. Institutional protocols and algorithms that pertain to the evaluation of patients at risk for COVID-19 are in a state of rapid change based on information released by regulatory bodies  including the CDC and federal and state organizations. These policies and algorithms were followed during the patient's care in the ED.  CRITICAL CARE Performed by: Harvest Dark   Total critical care time: 60 minutes  Critical care time was exclusive of separately billable procedures and treating other patients.  Critical care was necessary to treat or prevent imminent or life-threatening deterioration.  Critical care was time spent personally by me on the following activities: development of treatment plan with patient and/or surrogate as well as nursing, discussions with consultants, evaluation of patient's response to treatment, examination of patient, obtaining history from patient or surrogate, ordering and performing treatments and interventions, ordering and review of laboratory studies, ordering and review of radiographic studies, pulse oximetry and re-evaluation of patient's condition.  ____________________________________________   FINAL CLINICAL IMPRESSION(S) / ED DIAGNOSES  Weakness Diarrhea   Harvest Dark, MD 10/30/19 1655

## 2019-10-30 NOTE — ED Notes (Signed)
Family at bedside. Call bell within reach, stretcher locked in lowest position.

## 2019-10-30 NOTE — ED Notes (Signed)
Pt to OR.

## 2019-10-30 NOTE — Transfer of Care (Signed)
Immediate Anesthesia Transfer of Care Note  Patient: Kristina [facility]  Procedure(s) Performed: EXPLORATORY LAPAROTOMY (N/A ) SMALL BOWEL RESECTION (N/A Abdomen)  Patient Location: ICU  Anesthesia Type:General  Level of Consciousness: sedated and Patient remains intubated per anesthesia plan  Airway & Oxygen Therapy: Patient remains intubated per anesthesia plan and Patient placed on Ventilator (see vital sign flow sheet for setting)  Post-op Assessment: Report given to RN and Post -op Vital signs reviewed and stable  Post vital signs: Reviewed and stable  Last Vitals:  Vitals Value Taken Time  BP    Temp    Pulse    Resp    SpO2 94 % 10/30/19 2248    Last Pain:  Vitals:   10/30/19 1452  TempSrc:   PainSc: 8          Complications: No complications documented.

## 2019-10-30 NOTE — ED Notes (Signed)
Report given to [place] RN

## 2019-10-30 NOTE — ED Triage Notes (Signed)
Pt to ED via [facility] from home for chief complaint diarrhea x9 days and pain to RLQ. Pt has stage 4 lung cancer with brain mets. States no PO intake in last 2 days. Denies vomiting. Pt tachycardic on arrival.  Pt with 22g to R ac by EMS Pt alert and oriented.  Pt arrived with both legs wrapped d/t "weeping"

## 2019-10-31 ENCOUNTER — Encounter: Payer: Self-pay | Admitting: Surgery

## 2019-10-31 DIAGNOSIS — J9601 Acute respiratory failure with hypoxia: Secondary | ICD-10-CM

## 2019-10-31 LAB — BASIC METABOLIC PANEL
Anion gap: 6 (ref 5–15)
BUN: 29 mg/dL — ABNORMAL HIGH (ref 8–23)
CO2: 21 mmol/L — ABNORMAL LOW (ref 22–32)
Calcium: 7.5 mg/dL — ABNORMAL LOW (ref 8.9–10.3)
Chloride: 104 mmol/L (ref 98–111)
Creatinine, Ser: 0.94 mg/dL (ref 0.44–1.00)
GFR calc Af Amer: >60 mL/min (ref >60)
GFR calc non Af Amer: >60 mL/min (ref >60)
Glucose, Bld: 137 mg/dL — ABNORMAL HIGH (ref 70–99)
Potassium: 4.3 mmol/L (ref 3.5–5.1)
Sodium: 131 mmol/L — ABNORMAL LOW (ref 135–145)

## 2019-10-31 LAB — HEMOGLOBIN A1C
Hgb A1c MFr Bld: 6.5 % — ABNORMAL HIGH (ref 4.8–5.6)
Mean Plasma Glucose: 140 mg/dL

## 2019-10-31 LAB — MRSA PCR SCREENING: MRSA by PCR: NEGATIVE

## 2019-10-31 LAB — BPAM PLATELET PHERESIS
Blood Product Expiration Date: 202107132359
ISSUE DATE / TIME: 202107111907
Unit Type and Rh: 5100

## 2019-10-31 LAB — CBC
HCT: 27.7 % — ABNORMAL LOW (ref 36.0–46.0)
Hemoglobin: 9.3 g/dL — ABNORMAL LOW (ref 12.0–15.0)
MCH: 30.8 pg (ref 26.0–34.0)
MCHC: 33.6 g/dL (ref 30.0–36.0)
MCV: 91.7 fL (ref 80.0–100.0)
Platelets: 157 10*3/uL (ref 150–400)
RBC: 3.02 MIL/uL — ABNORMAL LOW (ref 3.87–5.11)
RDW: 16.5 % — ABNORMAL HIGH (ref 11.5–15.5)
WBC: 15 10*3/uL — ABNORMAL HIGH (ref 4.0–10.5)
nRBC: 0 % (ref 0.0–0.2)

## 2019-10-31 LAB — GLUCOSE, CAPILLARY
Glucose-Capillary: 121 mg/dL — ABNORMAL HIGH (ref 70–99)
Glucose-Capillary: 121 mg/dL — ABNORMAL HIGH (ref 70–99)
Glucose-Capillary: 123 mg/dL — ABNORMAL HIGH (ref 70–99)
Glucose-Capillary: 140 mg/dL — ABNORMAL HIGH (ref 70–99)
Glucose-Capillary: 140 mg/dL — ABNORMAL HIGH (ref 70–99)
Glucose-Capillary: 144 mg/dL — ABNORMAL HIGH (ref 70–99)

## 2019-10-31 LAB — PREPARE PLATELET PHERESIS: Unit division: 0

## 2019-10-31 LAB — PHOSPHORUS: Phosphorus: 3.4 mg/dL (ref 2.5–4.6)

## 2019-10-31 LAB — TRIGLYCERIDES: Triglycerides: 163 mg/dL — ABNORMAL HIGH (ref <150)

## 2019-10-31 LAB — HIV ANTIBODY (ROUTINE TESTING W REFLEX): HIV Screen 4th Generation wRfx: NONREACTIVE

## 2019-10-31 LAB — MAGNESIUM: Magnesium: 2 mg/dL (ref 1.7–2.4)

## 2019-10-31 MED ORDER — CHLORHEXIDINE GLUCONATE CLOTH 2 % EX PADS
6.0000 | MEDICATED_PAD | Freq: Every day | CUTANEOUS | Status: DC
  Administered 2019-11-01 – 2019-11-20 (×17): 6 via TOPICAL

## 2019-10-31 MED ORDER — MORPHINE SULFATE (PF) 2 MG/ML IV SOLN
1.0000 mg | INTRAVENOUS | Status: DC | PRN
  Administered 2019-10-31 – 2019-11-06 (×17): 1 mg via INTRAVENOUS
  Filled 2019-10-31 (×20): qty 1

## 2019-10-31 MED ORDER — ENOXAPARIN SODIUM 40 MG/0.4ML ~~LOC~~ SOLN
40.0000 mg | SUBCUTANEOUS | Status: DC
  Administered 2019-10-31 – 2019-11-15 (×15): 40 mg via SUBCUTANEOUS
  Filled 2019-10-31 (×15): qty 0.4

## 2019-10-31 MED ORDER — ORAL CARE MOUTH RINSE
15.0000 mL | OROMUCOSAL | Status: DC
  Administered 2019-10-31: 15 mL via OROMUCOSAL

## 2019-10-31 MED ORDER — COLLAGENASE 250 UNIT/GM EX OINT
TOPICAL_OINTMENT | Freq: Every day | CUTANEOUS | Status: AC
  Filled 2019-10-31 (×5): qty 30

## 2019-10-31 MED ORDER — ORAL CARE MOUTH RINSE
15.0000 mL | Freq: Two times a day (BID) | OROMUCOSAL | Status: DC
  Administered 2019-10-31 – 2019-11-05 (×9): 15 mL via OROMUCOSAL

## 2019-10-31 MED ORDER — CHLORHEXIDINE GLUCONATE 0.12% ORAL RINSE (MEDLINE KIT)
15.0000 mL | Freq: Two times a day (BID) | OROMUCOSAL | Status: DC
  Administered 2019-10-31: 15 mL via OROMUCOSAL

## 2019-10-31 MED ORDER — CHLORHEXIDINE GLUCONATE 0.12 % MT SOLN
15.0000 mL | Freq: Two times a day (BID) | OROMUCOSAL | Status: DC
  Administered 2019-10-31 – 2019-11-05 (×11): 15 mL via OROMUCOSAL
  Filled 2019-10-31 (×12): qty 15

## 2019-10-31 NOTE — Anesthesia Postprocedure Evaluation (Signed)
Anesthesia Post Note  Patient: Kristina [facility]  Procedure(s) Performed: EXPLORATORY LAPAROTOMY (N/A ) SMALL BOWEL RESECTION (N/A Abdomen)  Patient location during evaluation: SICU Anesthesia Type: General Level of consciousness: sedated Pain management: pain level controlled Vital Signs Assessment: post-procedure vital signs reviewed and stable Respiratory status: patient remains intubated per anesthesia plan Cardiovascular status: stable Postop Assessment: no apparent nausea or vomiting Anesthetic complications: no   No complications documented.   Last Vitals:  Vitals:   10/31/19 0700 10/31/19 0730  BP: (!) 101/55 (!) 107/53  Pulse: (!) 102 (!) 101  Resp: 19 20  Temp:  37.2 C  SpO2: 94% 96%    Last Pain:  Vitals:   10/31/19 0730  TempSrc: Axillary  PainSc:                  Kristina [facility]

## 2019-10-31 NOTE — Op Note (Signed)
Preoperative diagnosis: Pneumoperitoneum  Postoperative diagnosis: Perforated diverticulitis  Procedure: Exploratory laparatomy, lysis of adhesions, abdominal washout, sigmoid colon resection with colostomy (Hartmann's procedure).   Anesthesia: GETA  Surgeon: Benjamine Sprague, DO  Wound Classification: Contaminated  Specimen: Sigmoid colon  Complications: None apparent  EBL: 150 mL   Indications:  Patient is a 64 y.o. female with pneumoperitoneum.  Please see H&P for further details.  Description of procedure:  The patient was placed in the supine position and general endotracheal anesthesia was induced. A time-out was completed verifying correct patient, procedure, site, positioning, and implant(s) and/or special equipment prior to beginning this procedure. Preoperative antibiotics were continued from floor.  Platelets were transfusing at the time of procedure.  The abdomen was prepped and draped in the usual sterile fashion. A vertical midline incision was made from suprapubic to slightly above the umbilicus. This was deepened through the subcutaneous tissues and hemostasis was achieved with electrocautery. The linea alba was identified and incised and the peritoneal cavity entered. The abdomen was explored.  Quarter sized diverticular perforation noted in sigmoid colon.  Small bowel retracted to the right using a moist towel and self-retaining retractor. Using electrocautery, the colon was freed from its peritoneal attachments along the line of [place]t proximally from mid descending colon and distally to the pelvic inlet. Points of transection were selected proximally and distally from the obvious perforation in the area of healthy tissue. The bowel was divided with the green cartridge linear stapler at the proximal end and contoured stapler at the distal end. Ligasure used to divide the mesentery close to the colon. The specimen noted to have 1 additional micro probe slightly proximal to the  bigger perforation this was contained completely within the specimen.  Specimen was removed and sent to pathology. The abdominal cavity was then copiously irrigated until clear lfluid return noted and hemostasis was checked.   The proximal colon reached easily to the proposed colostomy site on left abdominal wall without tension. A disk of skin was removed from the colostomy site and the incision was deepened through all layers of the abdominal wall and dilated to admit two fingers. The colon was passed out through the ostomy site without torsion or tension.    The Hartmann's pouch was tagged with long suture of 2-0 prolene and allowed to fall into the pelvis.    the midline incision fascia was closed with running suture of PDS 0. The skin was closed with skin staples and dressed with honeycomb dressing.  The colostomy was matured with multiple interrupted sutures of 3-0 Vicryl. An ostomy bag was applied.  The patient tolerated the procedure well, transferred to ICU directly due to the critical condition upon presentation.  Sponge count and instrument count all correct at end of procedure.

## 2019-10-31 NOTE — Progress Notes (Signed)
CRITICAL CARE NOTE  BRIEF PATIENT DESCRIPTION:  64 yo female admitted with bowel perforation s/p exploratory laparotomy requiring bowel resection along with colostomy and remained mechanically intubated postop  SIGNIFICANT EVENTS/STUDIES:  07/11: CT Abd Pelvis revealed bowel perforation with small to moderate amount of pneumoperitoneum within the abdomen and pelvis. Pneumoperitoneum is centered adjacent to mildly thickened sigmoid colon, most likely representing perforation of the sigmoid colon, of uncertain etiology. Small amount of free fluid in the pelvis. No discrete abscess. Aortic Atherosclerosis (ICD10-I70.0). General Surgery consulted pt emergently transported for emergent exploratory laparotomy  07/11: Pt admitted to ICU mechanically intubated postop    CC  follow up respiratory failure  SUBJECTIVE Patient remains critically ill Prognosis is guarded   BP (!) 102/56   Pulse (!) 114   Temp (!) 96.9 F (36.1 C) (Axillary)   Resp (!) 21   Ht 5' 9"  (1.753 m)   Wt 100.6 kg   SpO2 98%   BMI 32.75 kg/m    I/O last 3 completed shifts: In: 4432.9 [I.V.:3458.1; Blood:375; IV Piggyback:599.9] Out: 1300 [Urine:1200; Drains:50; Blood:50] Total I/O In: 46.1 [I.V.:46.1] Out: -   SpO2: 98 % FiO2 (%): 35 %  Estimated body mass index is 32.75 kg/m as calculated from the following:   Height as of this encounter: 5' 9"  (1.753 m).   Weight as of this encounter: 100.6 kg.  SIGNIFICANT EVENTS   REVIEW OF SYSTEMS  PATIENT IS UNABLE TO PROVIDE COMPLETE REVIEW OF SYSTEMS DUE TO SEVERE CRITICAL ILLNESS        PHYSICAL EXAMINATION:  GENERAL:critically ill appearing, +resp distress HEAD: Normocephalic, atraumatic.  EYES: Pupils equal, round, reactive to light.  No scleral icterus.  MOUTH: Moist mucosal membrane. NECK: Supple.  PULMONARY: +rhonchi, +wheezing CARDIOVASCULAR: S1 and S2. Regular rate and rhythm. No murmurs, rubs, or gallops.  GASTROINTESTINAL: Soft,  nontender, -distended.  Positive bowel sounds.  no audible BS, soft, slightly distended, LUQ colostomy present device intact  MUSCULOSKELETAL: No swelling, clubbing, or edema.  NEUROLOGIC: obtunded, GCS<8 SKIN:intact,warm,dry  MEDICATIONS: I have reviewed all medications and confirmed regimen as documented   CULTURE RESULTS   Recent Results (from the past 240 hour(s))  SARS Coronavirus 2 by RT PCR (hospital order, performed in [facility] hospital lab) Nasopharyngeal Nasopharyngeal Swab     Status: None   Collection Time: 10/30/19  5:04 PM   Specimen: Nasopharyngeal Swab  Result Value Ref Range Status   SARS Coronavirus 2 NEGATIVE NEGATIVE Final    Comment: (NOTE) SARS-CoV-2 target nucleic acids are NOT DETECTED.  The SARS-CoV-2 RNA is generally detectable in upper and lower respiratory specimens during the acute phase of infection. The lowest concentration of SARS-CoV-2 viral copies this assay can detect is 250 copies / mL. A negative result does not preclude SARS-CoV-2 infection and should not be used as the sole basis for treatment or other patient management decisions.  A negative result may occur with improper specimen collection / handling, submission of specimen other than nasopharyngeal swab, presence of viral mutation(s) within the areas targeted by this assay, and inadequate number of viral copies (<250 copies / mL). A negative result must be combined with clinical observations, patient history, and epidemiological information.  Fact Sheet for Patients:   StrictlyIdeas.no  Fact Sheet for Healthcare Providers: BankingDealers.co.za  This test is not yet approved or  cleared by the [place] FDA and has been authorized for detection and/or diagnosis of SARS-CoV-2 by FDA under an Emergency Use Authorization (EUA).  This EUA will  remain in effect (meaning this test can be used) for the duration of the COVID-19 declaration  under Section 564(b)(1) of the Act, 21 U.S.C. section 360bbb-3(b)(1), unless the authorization is terminated or revoked sooner.  Performed at [facility], [address] [place], [place] [address]   MRSA PCR Screening     Status: None   Collection Time: 10/31/19  1:42 AM   Specimen: Nasal Mucosa; Nasopharyngeal  Result Value Ref Range Status   MRSA by PCR NEGATIVE NEGATIVE Final    Comment:        The GeneXpert MRSA Assay (FDA approved for NASAL specimens only), is one component of a comprehensive MRSA colonization surveillance program. It is not intended to diagnose MRSA infection nor to guide or monitor treatment for MRSA infections. Performed at [facility], [place], [place] [address]           Landry Dyke Abd 1 View  Result Date: 10/30/2019 CLINICAL DATA:  64 year old female with endotracheal and enteric tube placement. EXAM: PORTABLE CHEST AND ABDOMEN 1 VIEW COMPARISON:  CT abdomen pelvis dated 10/30/2019 and chest CT dated 08/09/2019. FINDINGS: An endotracheal tube is seen with tip approximately 3.5 cm above the carina. Enteric tube extends below the diaphragm with tip and side-port in the left upper abdomen likely in the body of the stomach. Right-sided Port-A-Cath with tip in the region of the cavoatrial junction. There is mild central vascular prominence. Diffuse bilateral interstitial prominence as well as streaky densities in the left lower lung field, new or progressed since the prior CT. Findings may represent combination of vascular congestion/edema and/or pneumonia. A small left pleural effusion may be present. No pneumothorax. The cardiac silhouette is within normal limits. Atherosclerotic calcification of the aorta. Air is noted within the colon. No bowel dilatation. A drainage catheter noted over the pelvis. Midline anterior pelvic wall cutaneous staples. No acute osseous pathology. IMPRESSION: 1. Endotracheal tube above the  carina. Enteric tube with tip and side-port in the body of the stomach. 2. Probable mild vascular congestion or edema. Pneumonia is not excluded. Clinical correlation is recommended. Electronically Signed   By: Anner Crete M.D.   On: 10/30/2019 23:47   CT ABDOMEN PELVIS W CONTRAST  Result Date: 10/30/2019 CLINICAL DATA:  64 year old female with diffuse abdominal and pelvic pain. History of metastatic lung cancer. EXAM: CT ABDOMEN AND PELVIS WITH CONTRAST TECHNIQUE: Multidetector CT imaging of the abdomen and pelvis was performed using the standard protocol following bolus administration of intravenous contrast. CONTRAST:  73m OMNIPAQUE IOHEXOL 300 MG/ML  SOLN COMPARISON:  08/09/2019 FINDINGS: Lower chest: No acute abnormality. Hepatobiliary: The liver and gallbladder are unremarkable. No biliary dilatation. Pancreas: Unremarkable Spleen: Unremarkable Adrenals/Urinary Tract: The kidneys, adrenal glands and bladder are unremarkable. Stomach/Bowel: A small to moderate amount of pneumoperitoneum is present within the abdomen and pelvis, and centered adjacent to the sigmoid colon, which exhibits mild wall thickening. Colonic diverticulosis is noted. No other bowel abnormalities are identified. The appendix is unremarkable. Vascular/Lymphatic: Aortic atherosclerosis. No enlarged abdominal or pelvic lymph nodes. Reproductive: Uterus and bilateral adnexa are unremarkable. Other: A small amount free fluid in the pelvis is noted. No discrete abscess is present. Musculoskeletal: No acute or suspicious bony abnormalities are noted. IMPRESSION: 1. Bowel perforation with small to moderate amount of pneumoperitoneum within the abdomen and pelvis. Pneumoperitoneum is centered adjacent to mildly thickened sigmoid colon, most likely representing perforation of the sigmoid colon, of uncertain etiology. Small amount of free  fluid in the pelvis. No discrete abscess. 2. Aortic Atherosclerosis (ICD10-I70.0). Critical  Value/emergent results were called by telephone at the time of interpretation on 10/30/2019 at 4:40 pm to provider Laban Emperor, who verbally acknowledged these results. Electronically Signed   By: Margarette Canada M.D.   On: 10/30/2019 16:42   DG Chest Port 1 View  Result Date: 10/30/2019 CLINICAL DATA:  64 year old female with endotracheal and enteric tube placement. EXAM: PORTABLE CHEST AND ABDOMEN 1 VIEW COMPARISON:  CT abdomen pelvis dated 10/30/2019 and chest CT dated 08/09/2019. FINDINGS: An endotracheal tube is seen with tip approximately 3.5 cm above the carina. Enteric tube extends below the diaphragm with tip and side-port in the left upper abdomen likely in the body of the stomach. Right-sided Port-A-Cath with tip in the region of the cavoatrial junction. There is mild central vascular prominence. Diffuse bilateral interstitial prominence as well as streaky densities in the left lower lung field, new or progressed since the prior CT. Findings may represent combination of vascular congestion/edema and/or pneumonia. A small left pleural effusion may be present. No pneumothorax. The cardiac silhouette is within normal limits. Atherosclerotic calcification of the aorta. Air is noted within the colon. No bowel dilatation. A drainage catheter noted over the pelvis. Midline anterior pelvic wall cutaneous staples. No acute osseous pathology. IMPRESSION: 1. Endotracheal tube above the carina. Enteric tube with tip and side-port in the body of the stomach. 2. Probable mild vascular congestion or edema. Pneumonia is not excluded. Clinical correlation is recommended. Electronically Signed   By: Anner Crete M.D.   On: 10/30/2019 23:47     Nutrition Status:       BMP Latest Ref Rng & Units 10/30/2019 10/20/2019 10/07/2019  Glucose 70 - 99 mg/dL 172(H) 208(H) 129(H)  BUN 8 - 23 mg/dL 43(H) 35(H) 27(H)  Creatinine 0.44 - 1.00 mg/dL 1.38(H) 1.09(H) 0.90  BUN/Creat Ratio 12 - 28 - - -  Sodium 135 - 145 mmol/L  126(L) 129(L) 138  Potassium 3.5 - 5.1 mmol/L 4.5 4.0 4.1  Chloride 98 - 111 mmol/L 93(L) 94(L) 100  CO2 22 - 32 mmol/L 22 23 28   Calcium 8.9 - 10.3 mg/dL 8.3(L) 8.6(L) 8.7(L)       Indwelling Urinary Catheter continued, requirement due to   Reason to continue Indwelling Urinary Catheter strict Intake/Output monitoring for hemodynamic instability         Ventilator continued, requirement due to severe respiratory failure   Ventilator Sedation RASS 0 to -2      ASSESSMENT AND PLAN SYNOPSIS 64 yo female admitted with bowel perforation s/p exploratory laparotomy requiring bowel resection along with colostomy and remained mechanically intubated postop with lung cancer/COPD and sepsis bowel perforation    Severe ACUTE Hypoxic and Hypercapnic Respiratory Failure -continue Full MV support -continue Bronchodilator Therapy -Wean Fio2 and PEEP as tolerated -will perform SAT/SBT when respiratory parameters are met -VAP/VENT bundle implementation   Morbid obesity, possible OSA.   Will certainly impact respiratory mechanics, ventilator weaning   ACUTE KIDNEY INJURY/Renal Failure -continue Foley Catheter-assess need -Avoid nephrotoxic agents -Follow urine output, BMP -Ensure adequate renal perfusion, optimize oxygenation -Renal dose medications     NEUROLOGY - intubated and sedated - minimal sedation to achieve a RASS goal: -1 Wake up assessment pending   CARDIAC ICU monitoring  ID -continue IV abx as prescibed -follow up cultures  GI GI PROPHYLAXIS as indicated  NUTRITIONAL STATUS Nutrition Status:         DIET-->NPO Constipation protocol as indicated  ENDO - will use ICU hypoglycemic\Hyperglycemia protocol if indicated     ELECTROLYTES -follow labs as needed -replace as needed -pharmacy consultation and following   DVT/GI PRX ordered and assessed TRANSFUSIONS AS NEEDED MONITOR FSBS I Assessed the need for Labs I Assessed the need for  Foley I Assessed the need for Central Venous Line Family Discussion when available I Assessed the need for Mobilization I made an Assessment of medications to be adjusted accordingly Safety Risk assessment completed   CASE DISCUSSED IN MULTIDISCIPLINARY ROUNDS WITH ICU TEAM  Critical Care Time devoted to patient care services described in this note is 35 minutes.   Overall, patient is critically ill, prognosis is guarded.  Patient with Multiorgan failure and at high risk for cardiac arrest and death.    Corrin Parker, M.D.  [facility]  Medical Director [facility] Director [facility] Cardio-Pulmonary Department

## 2019-10-31 NOTE — Progress Notes (Signed)
Kristina Wright visited pt. while rounding on the ICU; pt. sitting up in bed w/NG tube, dtr. Kristina Wright in chair at bedside.  Pt. shared that she developed a perforated bowel and became septic and had surgery last night to repair bowel.  Pt. extubated this AM (per RN and dtr.) and was easily talking and apparently coping effectively when [facility] visited.  Pt. shared that she was diagnosed with lung cancer several years ago and then with brain cancer.  Surgeries to treat both cancers appear to have been effective, but then pt. developed the aforementioned issue w/GI.  Pt. 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  Dtr. Kristina Wright had a baby boy (Chancellor/'Chase') three weeks ago and this seems to be a major source of joy to pt.  Pt. requested prayer at end of visit and would like continued chaplain support.  [place] will make an effort to follow up tomorrow.    10/31/19 1500  Clinical Encounter Type  Visited With Patient and family together  Visit Type Initial;Psychological support;Spiritual support;Social support;Critical Care  Referral From Other (Comment) (Routine Rounding)  Consult/Referral To Chaplain  Spiritual Encounters  Spiritual Needs Prayer;Emotional  Stress Factors  Patient Stress Factors Health changes;Loss of control;Major life changes  Family Stress Factors Health changes;Major life changes

## 2019-10-31 NOTE — Progress Notes (Signed)
Pt was suctioned prior to extubation for a small amount of clear secretions. Per Dr. Zoila Shutter order, she was extubated without incident. She is on RA and Sa02 = 95%.

## 2019-10-31 NOTE — Progress Notes (Signed)
Dressings changed per [facility] nurse orders. PRN morphine for pain. VSS. Pt A&OX4. Symmetrical in all extremities. Foley removed per protocol.

## 2019-10-31 NOTE — Plan of Care (Signed)
Patient admitted to ICU bed 01 with a perforated sigmoid colon with a new colostomy to the LLQ and 19 Fr JP drain. Patient is intubated ETT is 21 at the lip and has a NG tube in right nare at 58cm. Midline abdominal incision covered with a honeycomb dressing covered with tegaderm. Patient has dressings/koband wraps on BL lower extremities, upon removal venous stasis ulcers identified which are noted to be open with serosanguineous drainage, yellow slough and necrotic tissue.

## 2019-10-31 NOTE — Consult Note (Addendum)
WOC Nurse Consult Note: Reason for Consult:venous stasis ulcers to bilateral lower legs and feet.  Wounds with black dry eschar to wound beds .  Will implement enzymatic debridement Wound type:venous insufficiency Pressure Injury POA: NA Measurement: scattered 1.5 cm round nonintact lesions Wound CYO:[id number] slough and eschar Drainage (amount, consistency, odor) minimal serosanguinous  Necrotic odor Periwound:chronic skin changes. No edema noted Dressing procedure/placement/frequency: Cleanse wounds to bilateral lower legs with NS and pat dry.  Apply Santyl to wounds.  Cover with NS moist gauze and secure with dry gauze and kerlix/tape. Change daily.   [facility] Nurse ostomy consult note Stoma type/location: LLQ colostomy  Nonproductive at this time.  Stomal assessment/size: 2"  Peristomal assessment: pouch intact Treatment options for stomal/peristomal skin: none Output none  Ostomy pouching: 2pc.  2 3/4" pouch Education provided:  Patient in pain and unable to participate in care. Informed her that I would be teaching her how to care for ostomy when she is stronger.   Enrolled patient in [place] program: /No  Will follow.  Domenic Moras MSN, RN, FNP-BC CWON Wound, Ostomy, Continence Nurse Pager [phone]

## 2019-10-31 NOTE — Progress Notes (Signed)
Per Dr. Lysle Pearl keep NPO for now. Pt needs colostomy output prior to diet order. Per Dr. Lysle Pearl keep NG tube and order PT eval.

## 2019-10-31 NOTE — Progress Notes (Signed)
[facility] Progress Note Patient Name: Kristina Wright DOB: 1956/03/26 MRN: [id number]   Date of Service  10/31/2019  HPI/Events of Note  Patient with bowel perforation, peritonitis, septic shock who is s/p exploratory laparotomy for bowel resection, she also has post-operative respiratory failure , and is on the ventilator at this point.  eICU Interventions  New Patient Evaluation completed.        Kerry Kass Kristina Wright 10/31/2019, 12:10 AM

## 2019-11-01 ENCOUNTER — Encounter (INDEPENDENT_AMBULATORY_CARE_PROVIDER_SITE_OTHER): Payer: Medicaid Other

## 2019-11-01 DIAGNOSIS — I10 Essential (primary) hypertension: Secondary | ICD-10-CM

## 2019-11-01 DIAGNOSIS — F329 Major depressive disorder, single episode, unspecified: Secondary | ICD-10-CM

## 2019-11-01 DIAGNOSIS — K631 Perforation of intestine (nontraumatic): Secondary | ICD-10-CM | POA: Diagnosis not present

## 2019-11-01 DIAGNOSIS — F419 Anxiety disorder, unspecified: Secondary | ICD-10-CM

## 2019-11-01 LAB — URINE CULTURE: Culture: NO GROWTH

## 2019-11-01 LAB — BASIC METABOLIC PANEL
Anion gap: 9 (ref 5–15)
BUN: 27 mg/dL — ABNORMAL HIGH (ref 8–23)
CO2: 22 mmol/L (ref 22–32)
Calcium: 7.8 mg/dL — ABNORMAL LOW (ref 8.9–10.3)
Chloride: 104 mmol/L (ref 98–111)
Creatinine, Ser: 0.94 mg/dL (ref 0.44–1.00)
GFR calc Af Amer: >60 mL/min (ref >60)
GFR calc non Af Amer: >60 mL/min (ref >60)
Glucose, Bld: 98 mg/dL (ref 70–99)
Potassium: 3.8 mmol/L (ref 3.5–5.1)
Sodium: 135 mmol/L (ref 135–145)

## 2019-11-01 LAB — CBC
HCT: 26.6 % — ABNORMAL LOW (ref 36.0–46.0)
Hemoglobin: 9.1 g/dL — ABNORMAL LOW (ref 12.0–15.0)
MCH: 31 pg (ref 26.0–34.0)
MCHC: 34.2 g/dL (ref 30.0–36.0)
MCV: 90.5 fL (ref 80.0–100.0)
Platelets: 138 10*3/uL — ABNORMAL LOW (ref 150–400)
RBC: 2.94 MIL/uL — ABNORMAL LOW (ref 3.87–5.11)
RDW: 16.9 % — ABNORMAL HIGH (ref 11.5–15.5)
WBC: 18.3 10*3/uL — ABNORMAL HIGH (ref 4.0–10.5)
nRBC: 0 % (ref 0.0–0.2)

## 2019-11-01 LAB — TYPE AND SCREEN
ABO/RH(D): O POS
Antibody Screen: NEGATIVE

## 2019-11-01 LAB — GLUCOSE, CAPILLARY
Glucose-Capillary: 105 mg/dL — ABNORMAL HIGH (ref 70–99)
Glucose-Capillary: 111 mg/dL — ABNORMAL HIGH (ref 70–99)
Glucose-Capillary: 123 mg/dL — ABNORMAL HIGH (ref 70–99)
Glucose-Capillary: 74 mg/dL (ref 70–99)
Glucose-Capillary: 78 mg/dL (ref 70–99)
Glucose-Capillary: 85 mg/dL (ref 70–99)
Glucose-Capillary: 86 mg/dL (ref 70–99)
Glucose-Capillary: 93 mg/dL (ref 70–99)

## 2019-11-01 LAB — PHOSPHORUS: Phosphorus: 2.7 mg/dL (ref 2.5–4.6)

## 2019-11-01 LAB — SURGICAL PATHOLOGY

## 2019-11-01 LAB — MAGNESIUM: Magnesium: 2.4 mg/dL (ref 1.7–2.4)

## 2019-11-01 MED ORDER — LABETALOL HCL 5 MG/ML IV SOLN
10.0000 mg | INTRAVENOUS | Status: DC | PRN
  Administered 2019-11-10: 10 mg via INTRAVENOUS
  Filled 2019-11-01: qty 4

## 2019-11-01 MED ORDER — SODIUM CHLORIDE 0.9 % IV SOLN: INTRAVENOUS | Status: AC

## 2019-11-01 MED ORDER — LORAZEPAM 2 MG/ML IJ SOLN
0.5000 mg | Freq: Four times a day (QID) | INTRAMUSCULAR | Status: DC | PRN
  Administered 2019-11-03 – 2019-11-04 (×3): 0.5 mg via INTRAVENOUS
  Filled 2019-11-01 (×3): qty 1

## 2019-11-01 NOTE — Progress Notes (Addendum)
PROGRESS NOTE    Kristina Wright    Code Status: Full Code  KYH:[id number] DOB: 1955/10/03 DOA: 10/30/2019 LOS: 2 days  PCP: Juline Patch, MD CC:  Chief Complaint  Patient presents with   Diarrhea       Hospital Summary   This is a 64 year old female with past medical history of hypothyroidism, stage IV lung cancer with mets to brain, hypertension, hyperlipidemia, depression, cervical cancer who presented to [facility] on 7/11 via EMS from home with diarrhea and RLQ pain x9 days with decreased p.o. intake.   Lab results revealed Na+ 126, chloride 93, glucose 172, BUN 43, creatinine 1.38, calcium 8.3, albumin 2.2, CK 33, troponin 22, wbc 18.5, and hgb 11.2.  CT Abd Pelvis revealed bowel perforation with small to moderate amount of pneumoperitoneum within the abdomen and pelvis. Patient underwent emergent exploratory laparotomy, lysis of adhesions, abdominal washout and sigmoid colon resection with colostomy overnight 7/11 -7/12 with Dr. Lysle Pearl. Pt admitted to ICU mechanically intubated postop.  She was successfully extubated on 7/12  A & P   Active Problems:   Perforated bowel ([place])   Perforated diverticulitis s/p emergent exploratory laparotomy, lysis of adhesions, abdominal washout and sigmoid colon resection with colostomy overnight 7/11 -7/12 with Dr. Lysle Pearl, POD 2 Sepsis/septic shock clinically undetermined, more likely SIRS secondary to perforated diverticulitis on admission with tachycardia, leukocytosis. Did not require pressors Intra-Op mechanical intubation with successful extubation several hours later  on 7/12 Has an NG tube Recovering well with some blood in ostomy.  This was discussed with surgery who reports this is okay Leukocytosis increased, likely reactive postop Continue Zosyn Can probably switch to p.o. meds tomorrow Diet advancement and pain control per general surgery  Hypertension Continue current management with as needed labetalol Lisinopril-HCTZ on hold due to  AKI, can probably restart tomorrow if renal function stable  Hyperlipidemia  History of stage IV lung adenocarcinoma with mets to brain Continue Keppra  Anxiety/Depression Continue home meds when able to tolerate PO Ativan added for anxiety for now since not taking PO meds   DVT prophylaxis: enoxaparin (LOVENOX) injection 40 mg Start: 10/31/19 2000 SCDs Start: 10/30/19 1812    Family Communication: no family at bedside   Disposition Plan: transfer to [place] Status is: Inpatient  Remains inpatient appropriate because:IV treatments appropriate due to intensity of illness or inability to take PO   Dispo: The patient is from: Home              Anticipated d/c is to: SNF              Anticipated d/c date is: > 3 days              Patient currently is not medically stable to d/c.          Pressure injury documentation    None  Consultants  General Surgery  PCCM  Procedures  emergent exploratory laparotomy, lysis of adhesions, abdominal washout and sigmoid colon resection with colostomy overnight 7/11 -7/12 with Dr. Lysle Pearl  Antibiotics   Anti-infectives (From admission, onward)    Start     Dose/Rate Route Frequency Ordered Stop   10/31/19 0200  piperacillin-tazobactam (ZOSYN) IVPB 3.375 g     Discontinue     3.375 g 12.5 mL/hr over 240 Minutes Intravenous Every 8 hours 10/30/19 2343     10/30/19 1700  piperacillin-tazobactam (ZOSYN) IVPB 3.375 g        3.375 g 100 mL/hr over 30 Minutes Intravenous  Once 10/30/19 1645 10/30/19 1817         Subjective   Tearful today, reports that she is scared and anxious due to her overall health and cancer history/prognosis. I provided reassurance. Reports that her pain is controlled. No complaints otherwise.  No overnight events.   Objective   Vitals:   11/01/19 1200 11/01/19 1300 11/01/19 1400 11/01/19 1549  BP: (!) 150/122 (!) 143/74 (!) 142/107 (!) 148/79  Pulse: (!) 104 92 (!) 102 89  Resp: (!) 27 17 13 18     Temp:      TempSrc:      SpO2: 100% 95% 96% 97%  Weight:      Height:        Intake/Output Summary (Last 24 hours) at 11/01/2019 1659 Last data filed at 11/01/2019 1440 Gross per 24 hour  Intake 293.03 ml  Output 285 ml  Net 8.03 ml   Filed Weights   10/30/19 1452 10/30/19 2248  Weight: 103 kg 100.6 kg    Examination:  Physical Exam Vitals and nursing note reviewed.  Constitutional:      General: She is not in acute distress.    Comments: Chronically ill appearing  HENT:     Head:     Comments: NG tube Cardiovascular:     Rate and Rhythm: Regular rhythm. Tachycardia present.  Pulmonary:     Effort: Pulmonary effort is normal.     Breath sounds: Normal breath sounds.  Abdominal:     Comments: Colostomy with blood  Abdomen with post surgical dressing  Musculoskeletal:        General: No swelling or tenderness.  Skin:    Coloration: Skin is not jaundiced or pale.  Neurological:     General: No focal deficit present.  Psychiatric:        Mood and Affect: Mood is anxious. Affect is tearful.     Data Reviewed: I have personally reviewed following labs and imaging studies  CBC: Recent Labs  Lab 10/30/19 1508 10/31/19 0516 11/01/19 0747  WBC 18.5* 15.0* 18.3*  HGB 11.2* 9.3* 9.1*  HCT 32.8* 27.7* 26.6*  MCV 88.9 91.7 90.5  PLT 172 157 572*   Basic Metabolic Panel: Recent Labs  Lab 10/30/19 1508 10/31/19 0516 11/01/19 0747  NA 126* 131* 135  K 4.5 4.3 3.8  CL 93* 104 104  CO2 22 21* 22  GLUCOSE 172* 137* 98  BUN 43* 29* 27*  CREATININE 1.38* 0.94 0.94  CALCIUM 8.3* 7.5* 7.8*  MG  --  2.0 2.4  PHOS  --  3.4 2.7   GFR: Estimated Creatinine Clearance: 79.4 mL/min (by C-G formula based on SCr of 0.94 mg/dL). Liver Function Tests: Recent Labs  Lab 10/30/19 1508  AST 26  ALT 43  ALKPHOS 100  BILITOT 0.9  PROT 6.7  ALBUMIN 2.2*   Recent Labs  Lab 10/30/19 1508  LIPASE 20   No results for input(s): AMMONIA in the last 168  hours. Coagulation Profile: No results for input(s): INR, PROTIME in the last 168 hours. Cardiac Enzymes: Recent Labs  Lab 10/30/19 1508  CKTOTAL 33*   BNP (last 3 results) No results for input(s): PROBNP in the last 8760 hours. HbA1C: Recent Labs    10/31/19 0042  HGBA1C 6.5*   CBG: Recent Labs  Lab 11/01/19 0027 11/01/19 0336 11/01/19 0725 11/01/19 1103 11/01/19 1535  GLUCAP 105* 123* 93 85 78   Lipid Profile: Recent Labs    10/31/19 0516  TRIG 163*  Thyroid Function Tests: No results for input(s): TSH, T4TOTAL, FREET4, T3FREE, THYROIDAB in the last 72 hours. Anemia Panel: No results for input(s): VITAMINB12, FOLATE, FERRITIN, TIBC, IRON, RETICCTPCT in the last 72 hours. Sepsis Labs: No results for input(s): PROCALCITON, LATICACIDVEN in the last 168 hours.  Recent Results (from the past 240 hour(s))  SARS Coronavirus 2 by RT PCR (hospital order, performed in [facility] hospital lab) Nasopharyngeal Nasopharyngeal Swab     Status: None   Collection Time: 10/30/19  5:04 PM   Specimen: Nasopharyngeal Swab  Result Value Ref Range Status   SARS Coronavirus 2 NEGATIVE NEGATIVE Final    Comment: (NOTE) SARS-CoV-2 target nucleic acids are NOT DETECTED.  The SARS-CoV-2 RNA is generally detectable in upper and lower respiratory specimens during the acute phase of infection. The lowest concentration of SARS-CoV-2 viral copies this assay can detect is 250 copies / mL. A negative result does not preclude SARS-CoV-2 infection and should not be used as the sole basis for treatment or other patient management decisions.  A negative result may occur with improper specimen collection / handling, submission of specimen other than nasopharyngeal swab, presence of viral mutation(s) within the areas targeted by this assay, and inadequate number of viral copies (<250 copies / mL). A negative result must be combined with clinical observations, patient history, and  epidemiological information.  Fact Sheet for Patients:   StrictlyIdeas.no  Fact Sheet for Healthcare Providers: BankingDealers.co.za  This test is not yet approved or  cleared by the [place] FDA and has been authorized for detection and/or diagnosis of SARS-CoV-2 by FDA under an Emergency Use Authorization (EUA).  This EUA will remain in effect (meaning this test can be used) for the duration of the COVID-19 declaration under Section 564(b)(1) of the Act, 21 U.S.C. section 360bbb-3(b)(1), unless the authorization is terminated or revoked sooner.  Performed at [facility], [address] [place], [place] [address]   Blood culture (routine x 2)     Status: None (Preliminary result)   Collection Time: 10/30/19  5:26 PM   Specimen: BLOOD  Result Value Ref Range Status   Specimen Description BLOOD BLOOD LEFT HAND  Final   Special Requests   Final    BOTTLES DRAWN AEROBIC AND ANAEROBIC Blood Culture results may not be optimal due to an inadequate volume of blood received in culture bottles   Culture   Final    NO GROWTH 2 DAYS Performed at [facility], [address], [place], [place] [address]    Report Status PENDING  Incomplete  Blood culture (routine x 2)     Status: None (Preliminary result)   Collection Time: 10/30/19  5:36 PM   Specimen: BLOOD  Result Value Ref Range Status   Specimen Description BLOOD BLOOD RIGHT WRIST  Final   Special Requests   Final    AEROBIC BOTTLE ONLY Blood Culture results may not be optimal due to an inadequate volume of blood received in culture bottles   Culture   Final    NO GROWTH 2 DAYS Performed at [facility], [address], [place], [place] [address]    Report Status PENDING  Incomplete  Urine culture     Status: None   Collection Time: 10/30/19 11:18 PM   Specimen: Urine, Random  Result Value Ref Range Status   Specimen Description   Final     URINE, RANDOM Performed at [facility], [address], [place], [place] [address]  Special Requests   Final    NONE Performed at [facility], [address], [place], [place] [address]    Culture   Final    NO GROWTH Performed at [facility], [place] [address] [place], [place] [address]    Report Status 11/01/2019 FINAL  Final  MRSA PCR Screening     Status: None   Collection Time: 10/31/19  1:42 AM   Specimen: Nasal Mucosa; Nasopharyngeal  Result Value Ref Range Status   MRSA by PCR NEGATIVE NEGATIVE Final    Comment:        The GeneXpert MRSA Assay (FDA approved for NASAL specimens only), is one component of a comprehensive MRSA colonization surveillance program. It is not intended to diagnose MRSA infection nor to guide or monitor treatment for MRSA infections. Performed at [facility], [address], [place], [place] [address]          Radiology Studies: DG Abd 1 View  Result Date: 10/30/2019 CLINICAL DATA:  64 year old female with endotracheal and enteric tube placement. EXAM: PORTABLE CHEST AND ABDOMEN 1 VIEW COMPARISON:  CT abdomen pelvis dated 10/30/2019 and chest CT dated 08/09/2019. FINDINGS: An endotracheal tube is seen with tip approximately 3.5 cm above the carina. Enteric tube extends below the diaphragm with tip and side-port in the left upper abdomen likely in the body of the stomach. Right-sided Port-A-Cath with tip in the region of the cavoatrial junction. There is mild central vascular prominence. Diffuse bilateral interstitial prominence as well as streaky densities in the left lower lung field, new or progressed since the prior CT. Findings may represent combination of vascular congestion/edema and/or pneumonia. A small left pleural effusion may be present. No pneumothorax. The cardiac silhouette is within normal limits. Atherosclerotic calcification of the aorta. Air is noted within the colon. No bowel dilatation. A  drainage catheter noted over the pelvis. Midline anterior pelvic wall cutaneous staples. No acute osseous pathology. IMPRESSION: 1. Endotracheal tube above the carina. Enteric tube with tip and side-port in the body of the stomach. 2. Probable mild vascular congestion or edema. Pneumonia is not excluded. Clinical correlation is recommended. Electronically Signed   By: Anner Crete M.D.   On: 10/30/2019 23:47   DG Chest Port 1 View  Result Date: 10/30/2019 CLINICAL DATA:  64 year old female with endotracheal and enteric tube placement. EXAM: PORTABLE CHEST AND ABDOMEN 1 VIEW COMPARISON:  CT abdomen pelvis dated 10/30/2019 and chest CT dated 08/09/2019. FINDINGS: An endotracheal tube is seen with tip approximately 3.5 cm above the carina. Enteric tube extends below the diaphragm with tip and side-port in the left upper abdomen likely in the body of the stomach. Right-sided Port-A-Cath with tip in the region of the cavoatrial junction. There is mild central vascular prominence. Diffuse bilateral interstitial prominence as well as streaky densities in the left lower lung field, new or progressed since the prior CT. Findings may represent combination of vascular congestion/edema and/or pneumonia. A small left pleural effusion may be present. No pneumothorax. The cardiac silhouette is within normal limits. Atherosclerotic calcification of the aorta. Air is noted within the colon. No bowel dilatation. A drainage catheter noted over the pelvis. Midline anterior pelvic wall cutaneous staples. No acute osseous pathology. IMPRESSION: 1. Endotracheal tube above the carina. Enteric tube with tip and side-port in the body of the stomach. 2. Probable mild vascular congestion or edema. Pneumonia is not excluded. Clinical correlation is recommended. Electronically Signed   By: Laren Everts.D.  On: 10/30/2019 23:47        Scheduled Meds:  sodium chloride   Intravenous Once   chlorhexidine  15 mL Mouth Rinse  BID   Chlorhexidine Gluconate Cloth  6 each Topical Daily   collagenase   Topical Daily   enoxaparin (LOVENOX) injection  40 mg Subcutaneous Q24H   insulin aspart  0-9 Units Subcutaneous Q4H   mouth rinse  15 mL Mouth Rinse q12n4p   Continuous Infusions:  sodium chloride 100 mL/hr at 11/01/19 1617   famotidine (PEPCID) IV 20 mg (11/01/19 1113)   levETIRAcetam 500 mg (11/01/19 0822)   piperacillin-tazobactam (ZOSYN)  IV 12.5 mL/hr at 11/01/19 1440     Time spent: 37 minutes with over 50% of the time coordinating the patient's care    Harold Hedge, DO [facility] Pager [phone]  Call night coverage person covering after 7pm

## 2019-11-01 NOTE — Evaluation (Signed)
Physical Therapy Evaluation Patient Details Name: Kristina Wright MRN: [id number] DOB: May 25, 1955 Today's Date: 11/01/2019   History of Present Illness  64 yo female admitted with generalized weakness and diarrhea. Found to have bowel perforation s/p exploratory laparotomy requiring bowel resection along with colostomy and remained mechanically intubated postop. Patient extubated 10/31/19. History of lung cancer with brain mets, COPD, bilateral LE wounds.    Clinical Impression  PT evaluation completed. Patient required encouragement to participate and declined attempting to get OOB today. Patient was agreeable to sit up on edge of bed for ~ 4 minutes and required occasional Min A to maintain upright sitting balance. Noted drainage from BLE distal leg wounds when legs are in dependent position. Patient needs assistance also for bed mobility. Patient has generalized weakness and limited endurance for prolonged activity. Patient adamant about going home at discharge, although she could benefit from continued PT at [facility] given functional limitations. Recommend PT to maximize function and safety to facilitate independence.     Follow Up Recommendations SNF;Supervision for mobility/OOB (although patient wants to go home with family support )    Equipment Recommendations  Rolling walker with 5" wheels    Recommendations for Other Services       Precautions / Restrictions Precautions Precautions: Fall Restrictions Weight Bearing Restrictions: No      Mobility  Bed Mobility Overal bed mobility: Needs Assistance Bed Mobility: Supine to Sit;Sit to Supine     Supine to sit: Mod assist Sit to supine: Mod assist   General bed mobility comments: Assistance mostly for trunk support. verbal cues for technique   Transfers Overall transfer level: Needs assistance               General transfer comment: Patient declined attempting to get OOB today.   Ambulation/Gait                 Stairs            Wheelchair Mobility    Modified Rankin (Stroke Patients Only)       Balance Overall balance assessment: Needs assistance Sitting-balance support: Bilateral upper extremity supported;Feet unsupported Sitting balance-Leahy Scale: Poor Sitting balance - Comments: Min A required intermittently due to posterior lean                                      Pertinent Vitals/Pain Pain Assessment: 0-10 Pain Score: 7  Pain Location: abdomen  Pain Descriptors / Indicators: Sore Pain Intervention(s): Repositioned;Patient requesting pain meds-RN notified    Home Living Family/patient expects to be discharged to:: Private residence Living Arrangements: Alone;Other (Comment) (son here during summer ) Available Help at Discharge: Available PRN/intermittently Type of Home: House Home Access: Stairs to enter   Technical brewer of Steps: 4 Home Layout: One level   Additional Comments: patient reports she has sister that lives next door and she might plan to discharge to her daughter's home for additional help     Prior Function Level of Independence: Independent         Comments: patient reports recent decline in mobility and weakness, however at baseline patient is independent with mobility per her report      Hand Dominance        Extremity/Trunk Assessment   Upper Extremity Assessment Upper Extremity Assessment: Generalized weakness    Lower Extremity Assessment Lower Extremity Assessment: RLE deficits/detail;LLE deficits/detail RLE Deficits / Details: wound dressing  distally. patient able to perform partial SLR and heelslides in bed. generalized weakness throughout  RLE Sensation: decreased light touch LLE Deficits / Details: wound dressing distally. patient able to perform partial SLR and heelslides in bed. generalized weakness throughout  LLE Sensation: decreased light touch       Communication   Communication: No  difficulties  Cognition Arousal/Alertness: Awake/alert Behavior During Therapy: WFL for tasks assessed/performed Overall Cognitive Status: Within Functional Limits for tasks assessed                                        General Comments General comments (skin integrity, edema, etc.): Moderate amount of red drainage noted on bandages on bilateral LE with legs in dependent position.     Exercises     Assessment/Plan    PT Assessment Patient needs continued PT services  PT Problem List Decreased strength;Decreased range of motion;Decreased activity tolerance;Decreased balance;Decreased mobility;Decreased knowledge of use of DME;Decreased cognition;Decreased safety awareness;Decreased knowledge of precautions;Pain       PT Treatment Interventions DME instruction;Gait training;Stair training;Functional mobility training;Therapeutic activities;Therapeutic exercise;Balance training;Neuromuscular re-education;Cognitive remediation    PT Goals (Current goals can be found in the Care Plan section)  Acute Rehab PT Goals Patient Stated Goal: to go home  PT Goal Formulation: With patient Time For Goal Achievement: 11/15/19 Potential to Achieve Goals: Good    Frequency Min 2X/week   Barriers to discharge        Co-evaluation               AM-PAC PT "6 Clicks" Mobility  Outcome Measure Help needed turning from your back to your side while in a flat bed without using bedrails?: A Little Help needed moving from lying on your back to sitting on the side of a flat bed without using bedrails?: A Lot Help needed moving to and from a bed to a chair (including a wheelchair)?: A Lot Help needed standing up from a chair using your arms (e.g., wheelchair or bedside chair)?: A Lot Help needed to walk in hospital room?: Total Help needed climbing 3-5 steps with a railing? : Total 6 Click Score: 11    End of Session   Activity Tolerance: Patient limited by fatigue;Patient  limited by pain Patient left: in bed;with call bell/phone within reach;with bed alarm set Nurse Communication: Mobility status PT Visit Diagnosis: Muscle weakness (generalized) (M62.81);Difficulty in walking, not elsewhere classified (R26.2)    Time: 3419-6222 PT Time Calculation (min) (ACUTE ONLY): 27 min   Charges:   PT Evaluation $PT Eval Moderate Complexity: 1 Mod PT Treatments $Therapeutic Activity: 8-22 mins        Kristina Wright, PT, MPT  Percell Locus 11/01/2019, 12:57 PM

## 2019-11-01 NOTE — Progress Notes (Signed)
Subjective:  CC: Kristina Wright is a 64 y.o. female  Hospital stay day 2, 2 Days Post-Op Hartman's procedure for perforated diverticulitis  HPI: No acute issues overnight.  Patient requesting some ice chips.  ROS:  General: Denies weight loss, weight gain, fatigue, fevers, chills, and night sweats. Heart: Denies chest pain, palpitations, racing heart, irregular heartbeat, leg pain or swelling, and decreased activity tolerance. Respiratory: Denies breathing difficulty, shortness of breath, wheezing, cough, and sputum. GI: Denies change in appetite, heartburn, nausea, vomiting, constipation, diarrhea, and blood in stool. GU: Denies difficulty urinating, pain with urinating, urgency, frequency, blood in urine.   Objective:   Temp:  [97.6 F (36.4 C)-97.9 F (36.6 C)] 97.6 F (36.4 C) (07/13 1110) Pulse Rate:  [88-107] 101 (07/13 1110) Resp:  [13-25] 22 (07/13 1110) BP: (136-164)/(67-88) 164/82 (07/13 1100) SpO2:  [95 %-99 %] 99 % (07/13 1110)     Height: 5\' 9"  (175.3 cm) Weight: 100.6 kg BMI (Calculated): 32.74   Intake/Output this shift:   Intake/Output Summary (Last 24 hours) at 11/01/2019 1217 Last data filed at 11/01/2019 1100 Gross per 24 hour  Intake 290.94 ml  Output 285 ml  Net 5.94 ml    Constitutional :  alert, cooperative, appears stated age and no distress  Respiratory:  clear to auscultation bilaterally  Cardiovascular:  regular rate and rhythm  Gastrointestinal: Soft, no guarding, midline stapled incision clean dry and intact.  Ostomy mucosa remains dark but slightly lighter than previous day, with patches of pink mucosa underlying the sloughing dark mucosa.  Minimal sanguinous discharge in bag.  NG with bilious output.   Skin: Cool and moist  Psychiatric: Normal affect, non-agitated, not confused       LABS:  CMP Latest Ref Rng & Units 11/01/2019 10/31/2019 10/30/2019  Glucose 70 - 99 mg/dL 98 137(H) 172(H)  BUN 8 - 23 mg/dL 27(H) 29(H) 43(H)  Creatinine 0.44 -  1.00 mg/dL 0.94 0.94 1.38(H)  Sodium 135 - 145 mmol/L 135 131(L) 126(L)  Potassium 3.5 - 5.1 mmol/L 3.8 4.3 4.5  Chloride 98 - 111 mmol/L 104 104 93(L)  CO2 22 - 32 mmol/L 22 21(L) 22  Calcium 8.9 - 10.3 mg/dL 7.8(L) 7.5(L) 8.3(L)  Total Protein 6.5 - 8.1 g/dL - - 6.7  Total Bilirubin 0.3 - 1.2 mg/dL - - 0.9  Alkaline Phos 38 - 126 U/L - - 100  AST 15 - 41 U/L - - 26  ALT 0 - 44 U/L - - 43   CBC Latest Ref Rng & Units 11/01/2019 10/31/2019 10/30/2019  WBC 4.0 - 10.5 K/uL 18.3(H) 15.0(H) 18.5(H)  Hemoglobin 12.0 - 15.0 g/dL 9.1(L) 9.3(L) 11.2(L)  Hematocrit 36 - 46 % 26.6(L) 27.7(L) 32.8(L)  Platelets 150 - 400 K/uL 138(L) 157 172    RADS: n/a Assessment:   Status post Hartman's procedure for perforated diverticulitis.  Patient is recovering well postop, with labs continue to downtrend.  We will continue to monitor ostomy output and maintain NG tube for now.  Further medical care per primary and ICU team.  Okay to be discharged from the ICU from surgery standpoint.

## 2019-11-01 NOTE — Progress Notes (Signed)
Report called to 2c RN. Gas emptied from colostomy. Serosanguinous drainage noted in colostomy bag. Old serosanguinous drainage to honeycomb marked. Pt A&OX4 but forgetful. Frequently asks for ice chips. Urine dark and output 200 for this shift. Bladder scanned 200 in bladder. MD made aware and new orders for IV fluids placed. VSS. NG intact. Foot wound dressings changed per orders. PRN morphine administered for pain. Transferred to 2c via bed.

## 2019-11-01 NOTE — Progress Notes (Signed)
Pt refuses BLE dressing changes for now.

## 2019-11-02 DIAGNOSIS — K631 Perforation of intestine (nontraumatic): Secondary | ICD-10-CM | POA: Diagnosis not present

## 2019-11-02 DIAGNOSIS — L8990 Pressure ulcer of unspecified site, unspecified stage: Secondary | ICD-10-CM | POA: Insufficient documentation

## 2019-11-02 LAB — CBC
HCT: 26.5 % — ABNORMAL LOW (ref 36.0–46.0)
Hemoglobin: 8.6 g/dL — ABNORMAL LOW (ref 12.0–15.0)
MCH: 30.2 pg (ref 26.0–34.0)
MCHC: 32.5 g/dL (ref 30.0–36.0)
MCV: 93 fL (ref 80.0–100.0)
Platelets: 148 10*3/uL — ABNORMAL LOW (ref 150–400)
RBC: 2.85 MIL/uL — ABNORMAL LOW (ref 3.87–5.11)
RDW: 16.9 % — ABNORMAL HIGH (ref 11.5–15.5)
WBC: 14.5 10*3/uL — ABNORMAL HIGH (ref 4.0–10.5)
nRBC: 0.1 % (ref 0.0–0.2)

## 2019-11-02 LAB — GLUCOSE, CAPILLARY
Glucose-Capillary: 113 mg/dL — ABNORMAL HIGH (ref 70–99)
Glucose-Capillary: 174 mg/dL — ABNORMAL HIGH (ref 70–99)
Glucose-Capillary: 194 mg/dL — ABNORMAL HIGH (ref 70–99)
Glucose-Capillary: 60 mg/dL — ABNORMAL LOW (ref 70–99)
Glucose-Capillary: 70 mg/dL (ref 70–99)
Glucose-Capillary: 77 mg/dL (ref 70–99)
Glucose-Capillary: 97 mg/dL (ref 70–99)

## 2019-11-02 LAB — BASIC METABOLIC PANEL
Anion gap: 9 (ref 5–15)
BUN: 23 mg/dL (ref 8–23)
CO2: 21 mmol/L — ABNORMAL LOW (ref 22–32)
Calcium: 7.5 mg/dL — ABNORMAL LOW (ref 8.9–10.3)
Chloride: 106 mmol/L (ref 98–111)
Creatinine, Ser: 0.78 mg/dL (ref 0.44–1.00)
GFR calc Af Amer: >60 mL/min (ref >60)
GFR calc non Af Amer: >60 mL/min (ref >60)
Glucose, Bld: 75 mg/dL (ref 70–99)
Potassium: 3.4 mmol/L — ABNORMAL LOW (ref 3.5–5.1)
Sodium: 136 mmol/L (ref 135–145)

## 2019-11-02 LAB — MAGNESIUM: Magnesium: 2.1 mg/dL (ref 1.7–2.4)

## 2019-11-02 LAB — PHOSPHORUS: Phosphorus: 2.2 mg/dL — ABNORMAL LOW (ref 2.5–4.6)

## 2019-11-02 MED ORDER — CLOPIDOGREL BISULFATE 75 MG PO TABS
75.0000 mg | ORAL_TABLET | Freq: Every day | ORAL | Status: DC
  Administered 2019-11-03 – 2019-11-16 (×12): 75 mg via ORAL
  Filled 2019-11-02 (×14): qty 1

## 2019-11-02 MED ORDER — DEXTROSE 50 % IV SOLN
12.5000 g | Freq: Once | INTRAVENOUS | Status: AC
  Administered 2019-11-02: 12.5 g via INTRAVENOUS
  Filled 2019-11-02: qty 50

## 2019-11-02 MED ORDER — POTASSIUM CHLORIDE 10 MEQ/100ML IV SOLN
10.0000 meq | INTRAVENOUS | Status: AC
  Administered 2019-11-02 (×2): 10 meq via INTRAVENOUS
  Filled 2019-11-02: qty 100

## 2019-11-02 MED ORDER — POTASSIUM & SODIUM PHOSPHATES 280-160-250 MG PO PACK
1.0000 | PACK | Freq: Three times a day (TID) | ORAL | Status: DC
  Administered 2019-11-02 – 2019-11-03 (×3): 1 via ORAL
  Filled 2019-11-02 (×6): qty 1

## 2019-11-02 MED ORDER — SODIUM CHLORIDE 0.9 % IV SOLN
INTRAVENOUS | Status: DC | PRN
  Administered 2019-11-02 – 2019-11-06 (×3): 250 mL via INTRAVENOUS

## 2019-11-02 NOTE — Consult Note (Addendum)
[place] Nurse ostomy follow up Stoma type/location: LLQ colostomy  Dark, dry flush stoma Stomal assessment/size: 1 3/8" Peristomal assessment: intact  Midline surgical incision with staples.  Treatment options for stomal/peristomal skin: barrier ring and 2 piece pouch Output  Stoma sweat  Ostomy pouching: 2pc. 2 3/4"  Pouch with barrier ring  Education provided: Pouch change performed.  Patient is not able to see stoma.  We discussed twice weekly pouch changes and emptying when 1/3 full.  Supplies left at bedside Enrolled patient in [place] Start Discharge program: Yes will today.   Encouraged to accept topical wound care to legs for enzymatic debridement.  Will not implement [place] boots yet.  No edema to legs noted.  [place] team will follow.  Domenic Moras MSN, RN, FNP-BC CWON Wound, Ostomy, Continence Nurse Pager [phone]

## 2019-11-02 NOTE — Progress Notes (Signed)
Subjective:  CC: Kristina Wright is a 64 y.o. female  Hospital stay day 3, 3 Days Post-Op Hartman's procedure for perforated diverticulitis  HPI: No acute issues overnight.  Tolerating ice chips. Gas reported in bag yesterday by RN  ROS:  General: Denies weight loss, weight gain, fatigue, fevers, chills, and night sweats. Heart: Denies chest pain, palpitations, racing heart, irregular heartbeat, leg pain or swelling, and decreased activity tolerance. Respiratory: Denies breathing difficulty, shortness of breath, wheezing, cough, and sputum. GI: Denies change in appetite, heartburn, nausea, vomiting, constipation, diarrhea, and blood in stool. GU: Denies difficulty urinating, pain with urinating, urgency, frequency, blood in urine.   Objective:   Temp:  [97.3 F (36.3 C)-98.4 F (36.9 C)] 97.6 F (36.4 C) (07/14 0108) Pulse Rate:  [89-104] 103 (07/14 0108) Resp:  [13-27] 17 (07/14 0108) BP: (137-164)/(74-122) 139/75 (07/14 0108) SpO2:  [95 %-100 %] 96 % (07/14 0108)     Height: 5\' 9"  (175.3 cm) Weight: 100.6 kg BMI (Calculated): 32.74   Intake/Output this shift:   Intake/Output Summary (Last 24 hours) at 11/02/2019 1015 Last data filed at 11/02/2019 0932 Gross per 24 hour  Intake 1195.38 ml  Output 1905 ml  Net -709.62 ml    Constitutional :  alert, cooperative, appears stated age and no distress  Respiratory:  clear to auscultation bilaterally  Cardiovascular:  regular rate and rhythm  Gastrointestinal: .  Soft, no guarding, midline incision with some purulent drainage towards the inferior aspect.  Ostomy bag with some gas melanotic liquid, mucosa itself is less dark and there are patches of pink within the overall purple-looking mucosa.  Skin: Cool and moist  Psychiatric: Normal affect, non-agitated, not confused       LABS:  CMP Latest Ref Rng & Units 11/02/2019 11/01/2019 10/31/2019  Glucose 70 - 99 mg/dL 75 98 137(H)  BUN 8 - 23 mg/dL 23 27(H) 29(H)  Creatinine 0.44 -  1.00 mg/dL 0.78 0.94 0.94  Sodium 135 - 145 mmol/L 136 135 131(L)  Potassium 3.5 - 5.1 mmol/L 3.4(L) 3.8 4.3  Chloride 98 - 111 mmol/L 106 104 104  CO2 22 - 32 mmol/L 21(L) 22 21(L)  Calcium 8.9 - 10.3 mg/dL 7.5(L) 7.8(L) 7.5(L)  Total Protein 6.5 - 8.1 g/dL - - -  Total Bilirubin 0.3 - 1.2 mg/dL - - -  Alkaline Phos 38 - 126 U/L - - -  AST 15 - 41 U/L - - -  ALT 0 - 44 U/L - - -   CBC Latest Ref Rng & Units 11/02/2019 11/01/2019 10/31/2019  WBC 4.0 - 10.5 K/uL 14.5(H) 18.3(H) 15.0(H)  Hemoglobin 12.0 - 15.0 g/dL 8.6(L) 9.1(L) 9.3(L)  Hematocrit 36 - 46 % 26.5(L) 26.6(L) 27.7(L)  Platelets 150 - 400 K/uL 148(L) 138(L) 157    RADS: n/a Assessment:   Status post Hartman's procedure for perforated diverticulitis.  Continuing to improve.  Several staples along the inferior midline aspect open to drain some purulent fluid.  With 4 x 4 and covered with additional 4 x 4 and secured with paper tape.  We will continue with daily dressing changes at bedside.  Ostomy seems to be functional at this point.  We will continue to monitor closely.  Have NG tube in the meantime to see if she can tolerate removal and possible start of clear liquid diet later today.  Also okay to resume Plavix from my standpoint.

## 2019-11-02 NOTE — Progress Notes (Signed)
Pt declined B leg dressing change, offered twice during the shift. She has requested to have it done in the afternoon. Will report this to day RN. Dressings remain dry and intact.

## 2019-11-02 NOTE — Progress Notes (Signed)
PROGRESS NOTE    Kristina Wright  QQP:[id number] DOB: January 09, 1956 DOA: 10/30/2019 PCP: Juline Patch, MD   Chief Complaint  Patient presents with  . Diarrhea    Brief Narrative:   64 year old with history of hypothyroidism, stage IV lung cancer with brain mets hypertension, hyperlipidemia, depression, cervical cancer presents to [facility] on 10/30/2019 by EMS from home for diarrhea and right lower quadrant pain for 7 to 9 days.  She underwent a CT abdomen and pelvis revealing perforated bowel with small to moderate amount of pneumoperitoneum within the abdomen and pelvis.  General surgery consulted and she underwent emergent exploratory laparotomy with lysis of additions, abdominal washout and sigmoid colon resection with colostomy by Dr. Lysle Pearl on 7/11.  Intraoperatively she was intubated and transferred to ICU.  She was extubated on 10/31/19 and transferred to [facility] service.   Assessment & Plan:   Active Problems:   Perforated bowel ([place])   Pressure injury of skin   Diverticulitis s/p emergent exploratory laparotomy with lysis of adhesions, sigmoid colon resection with colostomy by Dr. Lysle Pearl Gastric decompression with NG tube currently clamped. Diet advancement as per general surgery. Pain control and empiric antibiotics with Zosyn.    Essential hypertension Blood pressure parameters are optimally controlled   Stage IV lung cancer and mets Outpatient follow-up with oncology Continue with Keppra for seizure Nexis.    Anxiety and depression Resume home meds when able to take and tolerate oral.   Acute anemia of blood loss Baseline hemoglobin around 12.  Hemoglobin dropped from 11.2 to 9.3 to 9.1 to 8.6.  Transfuse to keep hemoglobin in the 7. Anemia panel will be ordered.     Pressure injury Pressure Injury 11/01/19 Sacrum Mid Stage 2 -  Partial thickness loss of dermis presenting as a shallow open injury with a red, pink wound bed without slough. pink (Active)  11/01/19  1856  Location: Sacrum  Location Orientation: Mid  Staging: Stage 2 -  Partial thickness loss of dermis presenting as a shallow open injury with a red, pink wound bed without slough.  Wound Description (Comments): pink  Present on Admission:    Wound care consulted and recommendations given      Mild Thrombocytopenia Probably secondary to Zosyn Improving    Hypokalemia Replaced, check magnesium levels and keep potassium levels in the morning  DVT prophylaxis: Lovenox Code Status: Full code Family Communication: None at bedside Disposition:   Status is: Inpatient  Remains inpatient appropriate because:IV treatments appropriate due to intensity of illness or inability to take PO   Dispo: The patient is from: Home              Anticipated d/c is to: Pending              Anticipated d/c date is: 2 days              Patient currently is not medically stable to d/c.       Consultants:   Arnoldo Morale  Procedures: S/P Hartman's Procedure for perforated Diverticulitis.  Antimicrobials:  Anti-infectives (From admission, onward)   Start     Dose/Rate Route Frequency Ordered Stop   10/31/19 0200  piperacillin-tazobactam (ZOSYN) IVPB 3.375 g     Discontinue     3.375 g 12.5 mL/hr over 240 Minutes Intravenous Every 8 hours 10/30/19 2343     10/30/19 1700  piperacillin-tazobactam (ZOSYN) IVPB 3.375 g        3.375 g 100 mL/hr over 30 Minutes Intravenous  Once 10/30/19 1645 10/30/19 1817       Subjective: Sore belly, no nausea,or vomiting. Or chest pain or abdominal pain.   Objective: Vitals:   11/01/19 1549 11/01/19 1742 11/01/19 2127 11/02/19 0108  BP: (!) 148/79 (!) 152/83 137/78 139/75  Pulse: 89 (!) 103 (!) 102 (!) 103  Resp: 18  18 17   Temp:  98.4 F (36.9 C) (!) 97.3 F (36.3 C) 97.6 F (36.4 C)  TempSrc:  Axillary Oral Oral  SpO2: 97% 99% 99% 96%  Weight:      Height:        Intake/Output Summary (Last 24 hours) at 11/02/2019 1119 Last data filed at  11/02/2019 0932 Gross per 24 hour  Intake 1195.38 ml  Output 1805 ml  Net -609.62 ml   Filed Weights   10/30/19 1452 10/30/19 2248  Weight: 103 kg 100.6 kg    Examination:  General exam: Appears calm and comfortable has NG tube clamped.  Respiratory system: Clear to auscultation. Respiratory effort normal. Cardiovascular system: S1 & S2 heard, RRR. No JVD,  Gastrointestinal system: Abdomen is soft,  With mid line incision. Mildly tender, non distended, bowel sounds heard.   LLQ Ostomy bag empty.  Central nervous system: Alert and oriented. No focal neurological deficits. Extremities: stasis ulcers on the bilateral feet, stage II sacral decubitus ulcers with Skin: stasis ulcers on the feet.  Psychiatry:  Mood & affect appropriate.     Data Reviewed: I have personally reviewed following labs and imaging studies  CBC: Recent Labs  Lab 10/30/19 1508 10/31/19 0516 11/01/19 0747 11/02/19 0433  WBC 18.5* 15.0* 18.3* 14.5*  HGB 11.2* 9.3* 9.1* 8.6*  HCT 32.8* 27.7* 26.6* 26.5*  MCV 88.9 91.7 90.5 93.0  PLT 172 157 138* 148*    Basic Metabolic Panel: Recent Labs  Lab 10/30/19 1508 10/31/19 0516 11/01/19 0747 11/02/19 0433  NA 126* 131* 135 136  K 4.5 4.3 3.8 3.4*  CL 93* 104 104 106  CO2 22 21* 22 21*  GLUCOSE 172* 137* 98 75  BUN 43* 29* 27* 23  CREATININE 1.38* 0.94 0.94 0.78  CALCIUM 8.3* 7.5* 7.8* 7.5*  MG  --  2.0 2.4 2.1  PHOS  --  3.4 2.7 2.2*    GFR: Estimated Creatinine Clearance: 93.3 mL/min (by C-G formula based on SCr of 0.78 mg/dL).  Liver Function Tests: Recent Labs  Lab 10/30/19 1508  AST 26  ALT 43  ALKPHOS 100  BILITOT 0.9  PROT 6.7  ALBUMIN 2.2*    CBG: Recent Labs  Lab 11/01/19 1535 11/01/19 2027 11/01/19 2356 11/02/19 0519 11/02/19 0758  GLUCAP 78 74 86 77 70     Recent Results (from the past 240 hour(s))  SARS Coronavirus 2 by RT PCR (hospital order, performed in [facility] hospital lab) Nasopharyngeal Nasopharyngeal  Swab     Status: None   Collection Time: 10/30/19  5:04 PM   Specimen: Nasopharyngeal Swab  Result Value Ref [place] Status   SARS Coronavirus 2 NEGATIVE NEGATIVE Final    Comment: (NOTE) SARS-CoV-2 target nucleic acids are NOT DETECTED.  The SARS-CoV-2 RNA is generally detectable in upper and lower respiratory specimens during the acute phase of infection. The lowest concentration of SARS-CoV-2 viral copies this assay can detect is 250 copies / mL. A negative result does not preclude SARS-CoV-2 infection and should not be used as the sole basis for treatment or other patient management decisions.  A negative result may occur with improper specimen collection /  handling, submission of specimen other than nasopharyngeal swab, presence of viral mutation(s) within the areas targeted by this assay, and inadequate number of viral copies (<250 copies / mL). A negative result must be combined with clinical observations, patient history, and epidemiological information.  Fact Sheet for Patients:   StrictlyIdeas.no  Fact Sheet for Healthcare Providers: BankingDealers.co.za  This test is not yet approved or  cleared by the [place] FDA and has been authorized for detection and/or diagnosis of SARS-CoV-2 by FDA under an Emergency Use Authorization (EUA).  This EUA will remain in effect (meaning this test can be used) for the duration of the COVID-19 declaration under Section 564(b)(1) of the Act, 21 U.S.C. section 360bbb-3(b)(1), unless the authorization is terminated or revoked sooner.  Performed at [facility], [address] [place], [place] [address]   Blood culture (routine x 2)     Status: None (Preliminary result)   Collection Time: 10/30/19  5:26 PM   Specimen: BLOOD  Result Value Ref [place] Status   Specimen Description BLOOD BLOOD LEFT HAND  Final   Special Requests   Final    BOTTLES DRAWN AEROBIC AND ANAEROBIC  Blood Culture results may not be optimal due to an inadequate volume of blood received in culture bottles   Culture   Final    NO GROWTH 3 DAYS Performed at [facility], [address], [place], [place] [address]    Report Status PENDING  Incomplete  Blood culture (routine x 2)     Status: None (Preliminary result)   Collection Time: 10/30/19  5:36 PM   Specimen: BLOOD  Result Value Ref [place] Status   Specimen Description BLOOD BLOOD RIGHT WRIST  Final   Special Requests   Final    AEROBIC BOTTLE ONLY Blood Culture results may not be optimal due to an inadequate volume of blood received in culture bottles   Culture   Final    NO GROWTH 3 DAYS Performed at [facility], [address] [place], [place] [address]    Report Status PENDING  Incomplete  Urine culture     Status: None   Collection Time: 10/30/19 11:18 PM   Specimen: Urine, Random  Result Value Ref [place] Status   Specimen Description   Final    URINE, RANDOM Performed at [facility], [address] [place], [place] [address]    Special Requests   Final    NONE Performed at [facility], [address], [place], [place] [address]    Culture   Final    NO GROWTH Performed at [facility], [place] [address], [place],  [address]    Report Status 11/01/2019 FINAL  Final  MRSA PCR Screening     Status: None   Collection Time: 10/31/19  1:42 AM   Specimen: Nasal Mucosa; Nasopharyngeal  Result Value Ref [place] Status   MRSA by PCR NEGATIVE NEGATIVE Final    Comment:        The GeneXpert MRSA Assay (FDA approved for NASAL specimens only), is one component of a comprehensive MRSA colonization surveillance program. It is not intended to diagnose MRSA infection nor to guide or monitor treatment for MRSA infections. Performed at [facility], [address] [place],  [address]          Radiology Studies: No results found.      Scheduled  Meds: . sodium chloride   Intravenous Once  . chlorhexidine  15 mL  Mouth Rinse BID  . Chlorhexidine Gluconate Cloth  6 each Topical Daily  . collagenase   Topical Daily  . enoxaparin (LOVENOX) injection  40 mg Subcutaneous Q24H  . insulin aspart  0-9 Units Subcutaneous Q4H  . mouth rinse  15 mL Mouth Rinse q12n4p   Continuous Infusions: . sodium chloride 250 mL (11/02/19 1041)  . famotidine (PEPCID) IV 20 mg (11/02/19 0931)  . levETIRAcetam 500 mg (11/02/19 0928)  . piperacillin-tazobactam (ZOSYN)  IV 3.375 g (11/02/19 0957)     LOS: 3 days       Hosie Poisson, MD [facility]   To contact the attending provider between 7A-7P or the covering provider during after hours 7P-7A, please log into the web site [email or web address] and access using universal [place] password for that web site. If you do not have the password, please call the hospital operator.  11/02/2019, 11:19 AM

## 2019-11-03 ENCOUNTER — Inpatient Hospital Stay: Payer: 59

## 2019-11-03 ENCOUNTER — Inpatient Hospital Stay: Payer: 59 | Admitting: Oncology

## 2019-11-03 ENCOUNTER — Inpatient Hospital Stay: Payer: 59 | Admitting: Hospice and Palliative Medicine

## 2019-11-03 DIAGNOSIS — K631 Perforation of intestine (nontraumatic): Secondary | ICD-10-CM | POA: Diagnosis not present

## 2019-11-03 LAB — FERRITIN: Ferritin: 1383 ng/mL — ABNORMAL HIGH (ref 11–307)

## 2019-11-03 LAB — BASIC METABOLIC PANEL
Anion gap: 12 (ref 5–15)
BUN: 14 mg/dL (ref 8–23)
CO2: 20 mmol/L — ABNORMAL LOW (ref 22–32)
Calcium: 7.8 mg/dL — ABNORMAL LOW (ref 8.9–10.3)
Chloride: 102 mmol/L (ref 98–111)
Creatinine, Ser: 0.85 mg/dL (ref 0.44–1.00)
GFR calc Af Amer: >60 mL/min (ref >60)
GFR calc non Af Amer: >60 mL/min (ref >60)
Glucose, Bld: 113 mg/dL — ABNORMAL HIGH (ref 70–99)
Potassium: 3.8 mmol/L (ref 3.5–5.1)
Sodium: 134 mmol/L — ABNORMAL LOW (ref 135–145)

## 2019-11-03 LAB — CBC
HCT: 29 % — ABNORMAL LOW (ref 36.0–46.0)
Hemoglobin: 9.8 g/dL — ABNORMAL LOW (ref 12.0–15.0)
MCH: 30.8 pg (ref 26.0–34.0)
MCHC: 33.8 g/dL (ref 30.0–36.0)
MCV: 91.2 fL (ref 80.0–100.0)
Platelets: 145 10*3/uL — ABNORMAL LOW (ref 150–400)
RBC: 3.18 MIL/uL — ABNORMAL LOW (ref 3.87–5.11)
RDW: 17 % — ABNORMAL HIGH (ref 11.5–15.5)
WBC: 11.4 10*3/uL — ABNORMAL HIGH (ref 4.0–10.5)
nRBC: 0.2 % (ref 0.0–0.2)

## 2019-11-03 LAB — GLUCOSE, CAPILLARY
Glucose-Capillary: 110 mg/dL — ABNORMAL HIGH (ref 70–99)
Glucose-Capillary: 111 mg/dL — ABNORMAL HIGH (ref 70–99)
Glucose-Capillary: 142 mg/dL — ABNORMAL HIGH (ref 70–99)
Glucose-Capillary: 102 mg/dL — ABNORMAL HIGH (ref 70–99)
Glucose-Capillary: 214 mg/dL — ABNORMAL HIGH (ref 70–99)

## 2019-11-03 LAB — IRON AND TIBC
Iron: 24 ug/dL — ABNORMAL LOW (ref 28–170)
Saturation Ratios: 18 % (ref 10.4–31.8)
TIBC: 130 ug/dL — ABNORMAL LOW (ref 250–450)
UIBC: 106 ug/dL

## 2019-11-03 LAB — RETICULOCYTES
Immature Retic Fract: 28.4 % — ABNORMAL HIGH (ref 2.3–15.9)
RBC.: 3.12 MIL/uL — ABNORMAL LOW (ref 3.87–5.11)
Retic Count, Absolute: 74.9 10*3/uL (ref 19.0–186.0)
Retic Ct Pct: 2.4 % (ref 0.4–3.1)

## 2019-11-03 LAB — MAGNESIUM: Magnesium: 2.1 mg/dL (ref 1.7–2.4)

## 2019-11-03 LAB — PHOSPHORUS: Phosphorus: 2 mg/dL — ABNORMAL LOW (ref 2.5–4.6)

## 2019-11-03 LAB — FOLATE: Folate: 11.2 ng/mL (ref >5.9)

## 2019-11-03 LAB — VITAMIN B12: Vitamin B-12: 664 pg/mL (ref 180–914)

## 2019-11-03 MED ORDER — SODIUM PHOSPHATES 45 MMOLE/15ML IV SOLN
30.0000 mmol | Freq: Once | INTRAVENOUS | Status: AC
  Administered 2019-11-03: 30 mmol via INTRAVENOUS
  Filled 2019-11-03: qty 10

## 2019-11-03 MED ORDER — DAKINS (1/4 STRENGTH) 0.125 % EX SOLN
1.0000 "application " | Freq: Every day | CUTANEOUS | Status: AC
  Administered 2019-11-03 – 2019-11-05 (×3): 1 via TOPICAL
  Filled 2019-11-03: qty 10

## 2019-11-03 NOTE — Progress Notes (Signed)
Subjective:  CC: Kristina Wright is a 63 y.o. female  Hospital stay day 4, 4 Days Post-Op Hartman's procedure for perforated diverticulitis  HPI: No acute issues overnight.  Tolerating clears.   ROS:  General: Denies weight loss, weight gain, fatigue, fevers, chills, and night sweats. Heart: Denies chest pain, palpitations, racing heart, irregular heartbeat, leg pain or swelling, and decreased activity tolerance. Respiratory: Denies breathing difficulty, shortness of breath, wheezing, cough, and sputum. GI: Denies change in appetite, heartburn, nausea, vomiting, constipation, diarrhea, and blood in stool. GU: Denies difficulty urinating, pain with urinating, urgency, frequency, blood in urine.   Objective:   Temp:  [97.4 F (36.3 C)-98 F (36.7 C)] 97.9 F (36.6 C) (07/15 1132) Pulse Rate:  [105-115] 109 (07/15 1132) Resp:  [16-20] 19 (07/15 1132) BP: (129-146)/(67-81) 129/67 (07/15 1132) SpO2:  [97 %-100 %] 99 % (07/15 1132)     Height: 5\' 9"  (175.3 cm) Weight: 100.6 kg BMI (Calculated): 32.74   Intake/Output this shift:   Intake/Output Summary (Last 24 hours) at 11/03/2019 1158 Last data filed at 11/03/2019 0900 Gross per 24 hour  Intake 860 ml  Output 560 ml  Net 300 ml    Constitutional :  alert, cooperative, appears stated age and no distress  Respiratory:  clear to auscultation bilaterally  Cardiovascular:  regular rate and rhythm  Gastrointestinal: .  Soft, no guarding, midline incision packing with bright blue greens discharge.  No evidence of expanding erythema or cellulitis from area.  Still with some purulent drainage towards base. Fascia feels intact. Ostomy bag with some gas and melanotic liquid again, mucosa itself essentially unchanged with dusky, patches of pink within the overall purple-looking mucosa.  Digitization noted patent ostomy through palpable fascia.  Skin: Cool and moist  Psychiatric: Normal affect, non-agitated, not confused       LABS:  CMP  Latest Ref Rng & Units 11/03/2019 11/02/2019 11/01/2019  Glucose 70 - 99 mg/dL 113(H) 75 98  BUN 8 - 23 mg/dL 14 23 27(H)  Creatinine 0.44 - 1.00 mg/dL 0.85 0.78 0.94  Sodium 135 - 145 mmol/L 134(L) 136 135  Potassium 3.5 - 5.1 mmol/L 3.8 3.4(L) 3.8  Chloride 98 - 111 mmol/L 102 106 104  CO2 22 - 32 mmol/L 20(L) 21(L) 22  Calcium 8.9 - 10.3 mg/dL 7.8(L) 7.5(L) 7.8(L)  Total Protein 6.5 - 8.1 g/dL - - -  Total Bilirubin 0.3 - 1.2 mg/dL - - -  Alkaline Phos 38 - 126 U/L - - -  AST 15 - 41 U/L - - -  ALT 0 - 44 U/L - - -   CBC Latest Ref Rng & Units 11/03/2019 11/02/2019 11/01/2019  WBC 4.0 - 10.5 K/uL 11.4(H) 14.5(H) 18.3(H)  Hemoglobin 12.0 - 15.0 g/dL 9.8(L) 8.6(L) 9.1(L)  Hematocrit 36 - 46 % 29.0(L) 26.5(L) 26.6(L)  Platelets 150 - 400 K/uL 145(L) 148(L) 138(L)    RADS: n/a Assessment:   Status post Hartman's procedure for perforated diverticulitis, midline wound infection with pseudomonas.  We will continue with daily dakin dressing changes at bedside.  Ostomy still seems to be functional at this point.  We will continue to monitor closely.  continue clears until actual BM is recorded.

## 2019-11-03 NOTE — Progress Notes (Signed)
PROGRESS NOTE    Kristina Wright  SAY:[id number] DOB: 1956-03-06 DOA: 10/30/2019 PCP: Juline Patch, MD   Chief Complaint  Patient presents with  . Diarrhea    Brief Narrative:   64 year old with history of hypothyroidism, stage IV lung cancer with brain mets hypertension, hyperlipidemia, depression, cervical cancer presents to [facility] on 10/30/2019 by EMS from home for diarrhea and right lower quadrant pain for 7 to 9 days.  She underwent a CT abdomen and pelvis revealing perforated bowel with small to moderate amount of pneumoperitoneum within the abdomen and pelvis.  General surgery consulted and she underwent emergent exploratory laparotomy with lysis of additions, abdominal washout and sigmoid colon resection with colostomy by Dr. Lysle Pearl on 7/11.  Intraoperatively she was intubated and transferred to ICU.  She was extubated on 10/31/19 and transferred to [facility] service.   Assessment & Plan:   Active Problems:   Perforated bowel ([place])   Pressure injury of skin   Diverticulitis s/p emergent exploratory laparotomy with lysis of adhesions, sigmoid colon resection with colostomy by Dr. Lysle Pearl and mid line wound infection.  NG TUBE removed on 11/02/19 and diet started. Currently on clears and able to tolerate.  Diet advancement as per general surgery. Pain control and empiric antibiotics with Zosyn. Patient remains afebrile, improving WBC count.  WBC counts are at 11.4 today.   Essential hypertension Well-controlled blood pressure parameters.   Stage IV lung cancer and mets Outpatient follow-up with oncology Continue with Keppra for seizure prophylaxis.     Anxiety and depression Resume home meds when able to take and tolerate oral.   Acute anemia of blood loss Baseline hemoglobin around 12.  Hemoglobin dropped from 11.2 to 9.3 to 9.1 to 8.6 to 9/8  Transfuse to keep hemoglobin in the 7. Anemia panel will be ordered.   Twitching around the mouth earlier this am, while she is  alert and oriented and answering all questions.  Spontaneously disappeared after a few seconds.   Pressure injury Pressure Injury 11/01/19 Sacrum Mid Stage 2 -  Partial thickness loss of dermis presenting as a shallow open injury with a red, pink wound bed without slough. pink (Active)  11/01/19 1856  Location: Sacrum  Location Orientation: Mid  Staging: Stage 2 -  Partial thickness loss of dermis presenting as a shallow open injury with a red, pink wound bed without slough.  Wound Description (Comments): pink  Present on Admission:    Wound care consulted and recommendations given    Mild Thrombocytopenia Probably secondary to Zosyn Improving    Hypokalemia Replaced,    Hypophosphatemia Replaced   Diabetes mellitus.  CBG (last 3)  Recent Labs    11/03/19 0415 11/03/19 0749 11/03/19 1130  GLUCAP 110* 111* 142*   Hemoglobin A1c at 6.5. Continue with sliding scale insulin  DVT prophylaxis: Lovenox Code Status: Full code Family Communication: None at bedside Disposition:   Status is: Inpatient  Remains inpatient appropriate because:IV treatments appropriate due to intensity of illness or inability to take PO   Dispo: The patient is from: Home              Anticipated d/c is to: Pending              Anticipated d/c date is: 2 days              Patient currently is not medically stable to d/c.       Consultants:   Arnoldo Morale  Procedures: S/P Hartman's Procedure for  perforated Diverticulitis.  Antimicrobials:  Anti-infectives (From admission, onward)   Start     Dose/Rate Route Frequency Ordered Stop   10/31/19 0200  piperacillin-tazobactam (ZOSYN) IVPB 3.375 g     Discontinue     3.375 g 12.5 mL/hr over 240 Minutes Intravenous Every 8 hours 10/30/19 2343     10/30/19 1700  piperacillin-tazobactam (ZOSYN) IVPB 3.375 g        3.375 g 100 mL/hr over 30 Minutes Intravenous  Once 10/30/19 1645 10/30/19 1817       Subjective: Patient reports anxiety,  no chest pain or sob, no nausea, vomiting , abd pain persistent.   Objective: Vitals:   11/02/19 2000 11/02/19 2139 11/03/19 0416 11/03/19 1132  BP: 137/72  139/79 129/67  Pulse: (!) 115 (!) 109 (!) 106 (!) 109  Resp: 20   19  Temp: 97.7 F (36.5 C)  98 F (36.7 C) 97.9 F (36.6 C)  TempSrc: Oral  Oral   SpO2: 98%  97% 99%  Weight:      Height:        Intake/Output Summary (Last 24 hours) at 11/03/2019 1341 Last data filed at 11/03/2019 0900 Gross per 24 hour  Intake 860 ml  Output 560 ml  Net 300 ml   Filed Weights   10/30/19 1452 10/30/19 2248  Weight: 103 kg 100.6 kg    Examination:  General exam: Appears calm and comfortable, not in any distress Respiratory system: Air entry fair bilateral, no wheezing or rhonchi no tachypnea Cardiovascular system: S1-S2 heard, tachycardic, no JVD Gastrointestinal system: Abdomen is soft, mildly tender in the lower quadrant.  Non distended, midline incision intact, Central nervous system: Alert and answering all questions appropriately Extremities: Stasis ulcers on the bilateral feet, stage II sacral decubitus ulcer Skin: stasis ulcers on the feet.  Psychiatry: Mood is appropriate    Data Reviewed: I have personally reviewed following labs and imaging studies  CBC: Recent Labs  Lab 10/30/19 1508 10/31/19 0516 11/01/19 0747 11/02/19 0433 11/03/19 0644  WBC 18.5* 15.0* 18.3* 14.5* 11.4*  HGB 11.2* 9.3* 9.1* 8.6* 9.8*  HCT 32.8* 27.7* 26.6* 26.5* 29.0*  MCV 88.9 91.7 90.5 93.0 91.2  PLT 172 157 138* 148* 145*    Basic Metabolic Panel: Recent Labs  Lab 10/30/19 1508 10/31/19 0516 11/01/19 0747 11/02/19 0433 11/03/19 0644  NA 126* 131* 135 136 134*  K 4.5 4.3 3.8 3.4* 3.8  CL 93* 104 104 106 102  CO2 22 21* 22 21* 20*  GLUCOSE 172* 137* 98 75 113*  BUN 43* 29* 27* 23 14  CREATININE 1.38* 0.94 0.94 0.78 0.85  CALCIUM 8.3* 7.5* 7.8* 7.5* 7.8*  MG  --  2.0 2.4 2.1 2.1  PHOS  --  3.4 2.7 2.2* 2.0*     GFR: Estimated Creatinine Clearance: 87.8 mL/min (by C-G formula based on SCr of 0.85 mg/dL).  Liver Function Tests: Recent Labs  Lab 10/30/19 1508  AST 26  ALT 43  ALKPHOS 100  BILITOT 0.9  PROT 6.7  ALBUMIN 2.2*    CBG: Recent Labs  Lab 11/02/19 1959 11/02/19 2355 11/03/19 0415 11/03/19 0749 11/03/19 1130  GLUCAP 174* 97 110* 111* 142*     Recent Results (from the past 240 hour(s))  SARS Coronavirus 2 by RT PCR (hospital order, performed in [facility] hospital lab) Nasopharyngeal Nasopharyngeal Swab     Status: None   Collection Time: 10/30/19  5:04 PM   Specimen: Nasopharyngeal Swab  Result Value Ref Range  Status   SARS Coronavirus 2 NEGATIVE NEGATIVE Final    Comment: (NOTE) SARS-CoV-2 target nucleic acids are NOT DETECTED.  The SARS-CoV-2 RNA is generally detectable in upper and lower respiratory specimens during the acute phase of infection. The lowest concentration of SARS-CoV-2 viral copies this assay can detect is 250 copies / mL. A negative result does not preclude SARS-CoV-2 infection and should not be used as the sole basis for treatment or other patient management decisions.  A negative result may occur with improper specimen collection / handling, submission of specimen other than nasopharyngeal swab, presence of viral mutation(s) within the areas targeted by this assay, and inadequate number of viral copies (<250 copies / mL). A negative result must be combined with clinical observations, patient history, and epidemiological information.  Fact Sheet for Patients:   StrictlyIdeas.no  Fact Sheet for Healthcare Providers: BankingDealers.co.za  This test is not yet approved or  cleared by the [place] FDA and has been authorized for detection and/or diagnosis of SARS-CoV-2 by FDA under an Emergency Use Authorization (EUA).  This EUA will remain in effect (meaning this test can be used) for  the duration of the COVID-19 declaration under Section 564(b)(1) of the Act, 21 U.S.C. section 360bbb-3(b)(1), unless the authorization is terminated or revoked sooner.  Performed at [facility], [address] [place], [place] [address]   Blood culture (routine x 2)     Status: None (Preliminary result)   Collection Time: 10/30/19  5:26 PM   Specimen: BLOOD  Result Value Ref Range Status   Specimen Description BLOOD BLOOD LEFT HAND  Final   Special Requests   Final    BOTTLES DRAWN AEROBIC AND ANAEROBIC Blood Culture results may not be optimal due to an inadequate volume of blood received in culture bottles   Culture   Final    NO GROWTH 4 DAYS Performed at [facility], [address], [place], [place] [address]    Report Status PENDING  Incomplete  Blood culture (routine x 2)     Status: None (Preliminary result)   Collection Time: 10/30/19  5:36 PM   Specimen: BLOOD  Result Value Ref Range Status   Specimen Description BLOOD BLOOD RIGHT WRIST  Final   Special Requests   Final    AEROBIC BOTTLE ONLY Blood Culture results may not be optimal due to an inadequate volume of blood received in culture bottles   Culture   Final    NO GROWTH 4 DAYS Performed at [facility], [address] [place], [place] [address]    Report Status PENDING  Incomplete  Urine culture     Status: None   Collection Time: 10/30/19 11:18 PM   Specimen: Urine, Random  Result Value Ref Range Status   Specimen Description   Final    URINE, RANDOM Performed at [facility], [address], [place], [place] [address]    Special Requests   Final    NONE Performed at [facility], [address] [place], [place] [address]    Culture   Final    NO GROWTH Performed at [facility], [address], [place], [place] [address]    Report Status 11/01/2019 FINAL  Final  MRSA PCR Screening     Status: None   Collection Time: 10/31/19  1:42 AM    Specimen: Nasal Mucosa; Nasopharyngeal  Result Value Ref Range Status   MRSA by PCR NEGATIVE NEGATIVE Final  Comment:        The GeneXpert MRSA Assay (FDA approved for NASAL specimens only), is one component of a comprehensive MRSA colonization surveillance program. It is not intended to diagnose MRSA infection nor to guide or monitor treatment for MRSA infections. Performed at [facility], [address], [place], [place] [address]          Radiology Studies: No results found.      Scheduled Meds: . sodium chloride   Intravenous Once  . chlorhexidine  15 mL Mouth Rinse BID  . Chlorhexidine Gluconate Cloth  6 each Topical Daily  . clopidogrel  75 mg Oral Daily  . collagenase   Topical Daily  . enoxaparin (LOVENOX) injection  40 mg Subcutaneous Q24H  . insulin aspart  0-9 Units Subcutaneous Q4H  . mouth rinse  15 mL Mouth Rinse q12n4p  . sodium hypochlorite  1 application Topical Daily   Continuous Infusions: . sodium chloride 250 mL (11/02/19 1041)  . famotidine (PEPCID) IV 20 mg (11/03/19 1325)  . levETIRAcetam 500 mg (11/03/19 1010)  . piperacillin-tazobactam (ZOSYN)  IV 3.375 g (11/03/19 0829)  . sodium phosphate  Dextrose 5% IVPB       LOS: 4 days       Hosie Poisson, MD [facility]   To contact the attending provider between 7A-7P or the covering provider during after hours 7P-7A, please log into the web site [email or web address] and access using universal [place] password for that web site. If you do not have the password, please call the hospital operator.  11/03/2019, 1:41 PM

## 2019-11-04 ENCOUNTER — Inpatient Hospital Stay: Payer: 59

## 2019-11-04 DIAGNOSIS — R569 Unspecified convulsions: Secondary | ICD-10-CM

## 2019-11-04 DIAGNOSIS — K631 Perforation of intestine (nontraumatic): Secondary | ICD-10-CM | POA: Diagnosis not present

## 2019-11-04 LAB — CULTURE, BLOOD (ROUTINE X 2)
Culture: NO GROWTH
Culture: NO GROWTH

## 2019-11-04 LAB — CBC
HCT: 28.6 % — ABNORMAL LOW (ref 36.0–46.0)
Hemoglobin: 9.7 g/dL — ABNORMAL LOW (ref 12.0–15.0)
MCH: 30.8 pg (ref 26.0–34.0)
MCHC: 33.9 g/dL (ref 30.0–36.0)
MCV: 90.8 fL (ref 80.0–100.0)
Platelets: 129 10*3/uL — ABNORMAL LOW (ref 150–400)
RBC: 3.15 MIL/uL — ABNORMAL LOW (ref 3.87–5.11)
RDW: 16.9 % — ABNORMAL HIGH (ref 11.5–15.5)
WBC: 9.9 10*3/uL (ref 4.0–10.5)
nRBC: 0.2 % (ref 0.0–0.2)

## 2019-11-04 LAB — BASIC METABOLIC PANEL
Anion gap: 9 (ref 5–15)
BUN: 10 mg/dL (ref 8–23)
CO2: 23 mmol/L (ref 22–32)
Calcium: 7.3 mg/dL — ABNORMAL LOW (ref 8.9–10.3)
Chloride: 104 mmol/L (ref 98–111)
Creatinine, Ser: 0.73 mg/dL (ref 0.44–1.00)
GFR calc Af Amer: >60 mL/min (ref >60)
GFR calc non Af Amer: >60 mL/min (ref >60)
Glucose, Bld: 92 mg/dL (ref 70–99)
Potassium: 3.1 mmol/L — ABNORMAL LOW (ref 3.5–5.1)
Sodium: 136 mmol/L (ref 135–145)

## 2019-11-04 LAB — MAGNESIUM: Magnesium: 1.8 mg/dL (ref 1.7–2.4)

## 2019-11-04 LAB — GLUCOSE, CAPILLARY
Glucose-Capillary: 106 mg/dL — ABNORMAL HIGH (ref 70–99)
Glucose-Capillary: 112 mg/dL — ABNORMAL HIGH (ref 70–99)
Glucose-Capillary: 113 mg/dL — ABNORMAL HIGH (ref 70–99)
Glucose-Capillary: 124 mg/dL — ABNORMAL HIGH (ref 70–99)
Glucose-Capillary: 95 mg/dL (ref 70–99)

## 2019-11-04 LAB — PHOSPHORUS: Phosphorus: 2.6 mg/dL (ref 2.5–4.6)

## 2019-11-04 MED ORDER — LEVOTHYROXINE SODIUM 112 MCG PO TABS
112.0000 ug | ORAL_TABLET | Freq: Every day | ORAL | Status: DC
  Administered 2019-11-05 – 2019-11-21 (×16): 112 ug via ORAL
  Filled 2019-11-04 (×18): qty 1

## 2019-11-04 MED ORDER — FUROSEMIDE 20 MG PO TABS
20.0000 mg | ORAL_TABLET | Freq: Two times a day (BID) | ORAL | Status: DC
  Administered 2019-11-04 – 2019-11-05 (×3): 20 mg via ORAL
  Filled 2019-11-04 (×4): qty 1

## 2019-11-04 MED ORDER — ALBUTEROL SULFATE HFA 108 (90 BASE) MCG/ACT IN AERS: 1.0000 | INHALATION_SPRAY | Freq: Four times a day (QID) | RESPIRATORY_TRACT | Status: DC | PRN

## 2019-11-04 MED ORDER — POTASSIUM CHLORIDE CRYS ER 20 MEQ PO TBCR
40.0000 meq | EXTENDED_RELEASE_TABLET | Freq: Two times a day (BID) | ORAL | Status: AC
  Administered 2019-11-04 (×2): 40 meq via ORAL
  Filled 2019-11-04 (×2): qty 2

## 2019-11-04 MED ORDER — FLUOXETINE HCL 10 MG PO CAPS
10.0000 mg | ORAL_CAPSULE | Freq: Every day | ORAL | Status: DC
  Administered 2019-11-04 – 2019-11-21 (×16): 10 mg via ORAL
  Filled 2019-11-04 (×18): qty 1

## 2019-11-04 MED ORDER — BUSPIRONE HCL 5 MG PO TABS
5.0000 mg | ORAL_TABLET | Freq: Every day | ORAL | Status: DC
  Administered 2019-11-04 – 2019-11-21 (×16): 5 mg via ORAL
  Filled 2019-11-04 (×18): qty 1

## 2019-11-04 MED ORDER — UMECLIDINIUM-VILANTEROL 62.5-25 MCG/INH IN AEPB
1.0000 | INHALATION_SPRAY | Freq: Every day | RESPIRATORY_TRACT | Status: DC
  Administered 2019-11-04 – 2019-11-21 (×13): 1 via RESPIRATORY_TRACT
  Filled 2019-11-04 (×3): qty 14

## 2019-11-04 MED ORDER — BOOST / RESOURCE BREEZE PO LIQD CUSTOM
1.0000 | Freq: Three times a day (TID) | ORAL | Status: DC
  Administered 2019-11-04 – 2019-11-06 (×5): 1 via ORAL

## 2019-11-04 MED ORDER — ASPIRIN EC 81 MG PO TBEC
81.0000 mg | DELAYED_RELEASE_TABLET | Freq: Every day | ORAL | Status: DC
  Administered 2019-11-04 – 2019-11-05 (×2): 81 mg via ORAL
  Filled 2019-11-04 (×3): qty 1

## 2019-11-04 MED ORDER — ATORVASTATIN CALCIUM 10 MG PO TABS
10.0000 mg | ORAL_TABLET | Freq: Every day | ORAL | Status: DC
  Administered 2019-11-04 – 2019-11-05 (×2): 10 mg via ORAL
  Filled 2019-11-04 (×3): qty 1

## 2019-11-04 MED ORDER — ALBUTEROL SULFATE (2.5 MG/3ML) 0.083% IN NEBU: 2.5000 mg | INHALATION_SOLUTION | Freq: Four times a day (QID) | RESPIRATORY_TRACT | Status: DC | PRN

## 2019-11-04 NOTE — Progress Notes (Signed)
Patient called to the tech and said that she was scared that she was having a seizure at 10. Doctor Karleen Hampshire and nurse went into patient's room. Patient's left side of the face was twitching. Everything else was baseline. Patient was alert and oriented and answering all questions. Patient asked for [place] to make the twitching to stop. Doctor told RN to start the scheduled IV keppra and get the PRN IV ativan. While RN was at the pyxis, the twitching resolved on its own. Twitching lasted less than 10 minutes. Patient stated that this happens often and resolves on its own after a few minutes. Medications were still given. No other complaints at this time.

## 2019-11-04 NOTE — Progress Notes (Signed)
Subjective:  CC: Kristina Wright is a 64 y.o. female  Hospital stay day 5, 5 Days Post-Op Hartman's procedure for perforated diverticulitis  HPI: No acute issues overnight.  Still tolerating clears.   ROS:  General: Denies weight loss, weight gain, fatigue, fevers, chills, and night sweats. Heart: Denies chest pain, palpitations, racing heart, irregular heartbeat, leg pain or swelling, and decreased activity tolerance. Respiratory: Denies breathing difficulty, shortness of breath, wheezing, cough, and sputum. GI: Denies change in appetite, heartburn, nausea, vomiting, constipation, diarrhea, and blood in stool. GU: Denies difficulty urinating, pain with urinating, urgency, frequency, blood in urine.   Objective:   Temp:  [98 F (36.7 C)-98.1 F (36.7 C)] 98.1 F (36.7 C) (07/16 1129) Pulse Rate:  [106-110] 106 (07/16 1129) Resp:  [18-20] 20 (07/16 1129) BP: (128-141)/(65-83) 141/75 (07/16 1129) SpO2:  [99 %-100 %] 100 % (07/16 1129)     Height: 5\' 9"  (175.3 cm) Weight: 100.6 kg BMI (Calculated): 32.74   Intake/Output this shift:   Intake/Output Summary (Last 24 hours) at 11/04/2019 1525 Last data filed at 11/04/2019 1346 Gross per 24 hour  Intake 120 ml  Output --  Net 120 ml    Constitutional :  alert, cooperative, appears stated age and no distress  Respiratory:  clear to auscultation bilaterally  Cardiovascular:  regular rate and rhythm  Gastrointestinal: .  Soft, no guarding, midline incision packing with decreased purulent output. No evidence of expanding erythema or cellulitis from area.  Fascia feels intact. Ostomy bag with some gas and melanotic liquid again, mucosa itself essentially unchanged with dusky, patches of pink within the overall purple-looking mucosa.  Digitization noted patent ostomy through palpable fascia.  Skin: Cool and moist  Psychiatric: Normal affect, non-agitated, not confused       LABS:  CMP Latest Ref Rng & Units 11/04/2019 11/03/2019 11/02/2019   Glucose 70 - 99 mg/dL 92 113(H) 75  BUN 8 - 23 mg/dL 10 14 23   Creatinine 0.44 - 1.00 mg/dL 0.73 0.85 0.78  Sodium 135 - 145 mmol/L 136 134(L) 136  Potassium 3.5 - 5.1 mmol/L 3.1(L) 3.8 3.4(L)  Chloride 98 - 111 mmol/L 104 102 106  CO2 22 - 32 mmol/L 23 20(L) 21(L)  Calcium 8.9 - 10.3 mg/dL 7.3(L) 7.8(L) 7.5(L)  Total Protein 6.5 - 8.1 g/dL - - -  Total Bilirubin 0.3 - 1.2 mg/dL - - -  Alkaline Phos 38 - 126 U/L - - -  AST 15 - 41 U/L - - -  ALT 0 - 44 U/L - - -   CBC Latest Ref Rng & Units 11/04/2019 11/03/2019 11/02/2019  WBC 4.0 - 10.5 K/uL 9.9 11.4(H) 14.5(H)  Hemoglobin 12.0 - 15.0 g/dL 9.7(L) 9.8(L) 8.6(L)  Hematocrit 36 - 46 % 28.6(L) 29.0(L) 26.5(L)  Platelets 150 - 400 K/uL 129(L) 145(L) 148(L)    RADS: CLINICAL DATA:  Ileus.  EXAM: ABDOMEN - 1 VIEW  COMPARISON:  October 30, 2019.  FINDINGS: The bowel gas pattern is normal. Ostomy is noted in left lower quadrant. Surgical drain is noted in the pelvis.  IMPRESSION: No evidence of bowel obstruction or ileus.   Electronically Signed   By: Marijo Conception M.D.   On: 11/04/2019 13:07 Assessment:   Status post Hartman's procedure for perforated diverticulitis, midline wound infection with pseudomonas. Improved with dakin's.  No blue-green discharge and purulent drainage overall decreasing.   We will continue with daily dakin dressing changes at bedside at least through weekend.   Ostomy still  seems to be functional at this point.  We will continue to monitor closely.  continue clears until actual BM is recorded. KUB from today negative for ileus, reassuring.  Chronic medical issue care per hospitalist.  Surgery to continue to follow

## 2019-11-04 NOTE — Progress Notes (Addendum)
Initial Nutrition Assessment  DOCUMENTATION CODES:   Obesity unspecified  INTERVENTION:   Boost Breeze po TID, each supplement provides 250 kcal and 9 grams of protein  Recommend TPN if unable to advance diet in the next 1-2 days  Pt at high refeed risk; recommend monitor K, Mg and P labs daily   NUTRITION DIAGNOSIS:   Inadequate oral intake related to acute illness as evidenced by other (comment) (pt on NPO/clear liquid diet since admit).  GOAL:   Patient will meet greater than or equal to 90% of their needs  MONITOR:   PO intake, Supplement acceptance, Labs, Weight trends, Skin, I & O's, Diet advancement  REASON FOR ASSESSMENT:   NPO/Clear Liquid Diet    ASSESSMENT:   64 yo female with a PMH of Hypothyroidism, Stage IV Malignant Neoplasm of the Lung with mets to the brain, HTN, HLD, Depression and Cervical Cancer (dx 1989) who was admitted with bowel perforation now s/p exploratory laparatomy, lysis of adhesions, abdominal washout, sigmoid colon resection with colostomy (Hartmann's procedure) 7/12   Met with pt in room today. Pt reports poor appetite and oral intake for 1 week pta. Pt reports that her appetite has remained poor in hospital. Pt advanced to a clear liquid diet on 7/14. Pt reports that she has been eating sips and bites of meals. Pt has remained on a NPO/clear liquid diet since admit and is now without nutrition for 6 days. RD discussed with pt the importance of adequate nutrition needed for post op healing. Pt is willing to try Boost Breeze. Would recommend TPN if unable to advanced diet in the next 1-2 days. Pt is at high refeed risk. Pt reports that she is passing some flatus and having some small BMs. Per chart, pt is down 5lbs(2%) over the past 10 days; this is not significant.   Medications reviewed and include: aspirin, plavix, lovenox, lasix, insulin, synthroid, KCl, pepcid, zosyn  Labs reviewed: K 3.1(L), P 2.6 wnl, Mg 1.8 wnl Hgb 9.7(L), Hct  28.6(L)  NUTRITION - FOCUSED PHYSICAL EXAM:    Most Recent Value  Orbital Region No depletion  Upper Arm Region No depletion  Thoracic and Lumbar Region No depletion  Buccal Region No depletion  Temple Region No depletion  Clavicle Bone Region No depletion  Clavicle and Acromion Bone Region No depletion  Scapular Bone Region No depletion  Dorsal Hand No depletion  Patellar Region No depletion  Anterior Thigh Region No depletion  Posterior Calf Region No depletion  Edema (RD Assessment) Mild  Hair Reviewed  Eyes Reviewed  Mouth Reviewed  Skin Reviewed  Nails Reviewed     Diet Order:   Diet Order            Diet clear liquid Room service appropriate? Yes; Fluid consistency: Thin  Diet effective now                EDUCATION NEEDS:   Education needs have been addressed  Skin:  Skin Assessment: Reviewed RN Assessment (incision abdomen, Stage II sacrum, VSU L leg)  Last BM:  7/16- small amout via ostomy  Height:   Ht Readings from Last 1 Encounters:  10/30/19 5' 9"  (1.753 m)    Weight:   Wt Readings from Last 1 Encounters:  10/30/19 100.6 kg    Ideal Body Weight:  65.9 kg  BMI:  Body mass index is 32.75 kg/m.  Estimated Nutritional Needs:   Kcal:  2100-2400kcal/day  Protein:  110-120g/day  Fluid:  2-2.3L/day  Koleen Distance MS, RD, LDN Please refer to [facility] for RD and/or RD on-call/weekend/after hours pager

## 2019-11-04 NOTE — Progress Notes (Signed)
Pharmacy Antibiotic Note  Kristina Wright is a 64 y.o. female admitted on 10/30/2019 with diverticulitis.  Pharmacy was consulted for zosyn dosing.   Hospital stay day 5, 5 Days Post-Op Hartman's procedure for perforated diverticulitis. Since admission her leukocytosis has resolved, renal function has been stable,  no recent febrile episodes and a clear liquid diet has been started. This is day  # 5 of IV Zosyn  Plan:  Continue Zosyn 3.375g IV q8h (4 hour infusion).  Height: 5\' 9"  (175.3 cm) Weight: 100.6 kg (221 lb 12.5 oz) IBW/kg (Calculated) : 66.2  Temp (24hrs), Avg:98 F (36.7 C), Min:97.9 F (36.6 C), Max:98 F (36.7 C)  Recent Labs  Lab 10/31/19 0516 11/01/19 0747 11/02/19 0433 11/03/19 0644 11/04/19 0442  WBC 15.0* 18.3* 14.5* 11.4* 9.9  CREATININE 0.94 0.94 0.78 0.85 0.73    Estimated Creatinine Clearance: 93.3 mL/min (by C-G formula based on SCr of 0.73 mg/dL).    No Known Allergies  Antimicrobials this admission: Zosyn 7/11 >>   Microbiology results: 7/11 BCx: NG final 7/11 UCx: negative  7/11 SARS CoV-2: negative  7/12 MRSA PCR: negative  Thank you for allowing pharmacy to be a part of this patient's care.  Dallie Piles 11/04/2019 8:30 AM

## 2019-11-04 NOTE — Consult Note (Signed)
Reason for Consult: suspected seizure  Requesting Physician: Dr. Karleen Hampshire  CC: suspected seizures   HPI: Kristina Wright 64 year old with history of hypothyroidism, stage IV lung cancer with brain mets hypertension, hyperlipidemia, depression, cervical cancer presents to [facility] on 10/30/2019 by EMS from home for diarrhea and right lower quadrant pain for 7 to 9 days.  She underwent a CT abdomen and pelvis revealing perforated bowel with small to moderate amount of pneumoperitoneum within the abdomen and pelvis.   Neurology consulted for suspected seizures. Pt states she had muscle twitches on L side that subsided after few seconds/minutes. Prior hx of similar episodes in past.    Past Medical History:  Diagnosis Date   Cancer ([place]) 1989   cervical   Depression    Family history of breast cancer    Family history of colon cancer    Family history of ovarian cancer    Family history of stomach cancer    Family history of throat cancer    Hyperlipidemia    Hypertension    Malignant neoplasm of lung ([place]) 03/01/2018   Thyroid disease     Past Surgical History:  Procedure Laterality Date   BILATERAL CARPAL TUNNEL RELEASE     BOWEL RESECTION N/A 10/30/2019   Procedure: SMALL BOWEL RESECTION;  Surgeon: Benjamine Sprague, DO;  Location: [facility] ORS;  Service: General;  Laterality: N/A;   BREAST CYST ASPIRATION Left    LAPAROTOMY N/A 10/30/2019   Procedure: EXPLORATORY LAPAROTOMY;  Surgeon: Benjamine Sprague, DO;  Location: [facility] ORS;  Service: General;  Laterality: N/A;   LUNG BIOPSY     PARTIAL HYSTERECTOMY     PORTA CATH INSERTION N/A 03/03/2018   Procedure: PORTA CATH INSERTION;  Surgeon: Katha Cabal, MD;  Location: [place] [facility];  Service: Cardiovascular;  Laterality: N/A;   TUBAL LIGATION      Family History  Problem Relation Age of Onset   Colon cancer Mother 16   Colon cancer Maternal Grandmother 70   Hypertension Father    Breast cancer Sister 4   Throat  cancer Brother    Heart attack Maternal Aunt    Ovarian cancer Maternal Aunt 60   Stomach cancer Paternal Grandmother        dx >50   Throat cancer Brother     Social History:  reports that she quit smoking about 1 years ago. Her smoking use included cigarettes. She has a 25.00 pack-year smoking history. She has never used smokeless tobacco. She reports current drug use. Drug: Marijuana. She reports that she does not drink alcohol.  No Known Allergies  Medications: I have reviewed the patient's current medications.   Physical Examination: Blood pressure (!) 141/75, pulse (!) 106, temperature 98.1 F (36.7 C), temperature source Oral, resp. rate 20, height 5\' 9"  (1.753 m), weight 100.6 kg, SpO2 100 %.    Neurological Examination   Mental Status: Alert, oriented to name and location  Cranial Nerves: II: Discs flat bilaterally; Visual fields grossly normal, pupils equal, round, reactive to light and accommodation III,IV, VI: ptosis not present, extra-ocular motions intact bilaterally V,VII: smile symmetric, facial light touch sensation normal bilaterally VIII: hearing normal bilaterally XI: bilateral shoulder shrug XII: midline tongue extension Motor: Generalized weakness through out LE more so then UE Sensory: Pinprick and light touch intact throughout, bilaterally Deep Tendon Reflexes: 1+ and symmetric throughout       Laboratory Studies:   Basic Metabolic Panel: Recent Labs  Lab 10/31/19 0516 10/31/19 0516 11/01/19 0747  11/01/19 0747 11/02/19 0433 11/03/19 0644 11/04/19 0442  NA 131*  --  135  --  136 134* 136  K 4.3  --  3.8  --  3.4* 3.8 3.1*  CL 104  --  104  --  106 102 104  CO2 21*  --  22  --  21* 20* 23  GLUCOSE 137*  --  98  --  75 113* 92  BUN 29*  --  27*  --  23 14 10   CREATININE 0.94  --  0.94  --  0.78 0.85 0.73  CALCIUM 7.5*   < > 7.8*   < > 7.5* 7.8* 7.3*  MG 2.0  --  2.4  --  2.1 2.1 1.8  PHOS 3.4  --  2.7  --  2.2* 2.0* 2.6   < > =  values in this interval not displayed.    Liver Function Tests: Recent Labs  Lab 10/30/19 1508  AST 26  ALT 43  ALKPHOS 100  BILITOT 0.9  PROT 6.7  ALBUMIN 2.2*   Recent Labs  Lab 10/30/19 1508  LIPASE 20   No results for input(s): AMMONIA in the last 168 hours.  CBC: Recent Labs  Lab 10/31/19 0516 11/01/19 0747 11/02/19 0433 11/03/19 0644 11/04/19 0442  WBC 15.0* 18.3* 14.5* 11.4* 9.9  HGB 9.3* 9.1* 8.6* 9.8* 9.7*  HCT 27.7* 26.6* 26.5* 29.0* 28.6*  MCV 91.7 90.5 93.0 91.2 90.8  PLT 157 138* 148* 145* 129*    Cardiac Enzymes: Recent Labs  Lab 10/30/19 1508  CKTOTAL 33*    BNP: Invalid input(s): POCBNP  CBG: Recent Labs  Lab 11/03/19 1946 11/04/19 0000 11/04/19 0404 11/04/19 0802 11/04/19 1126  GLUCAP 214* 124* 95 106* 112*    Microbiology: Results for orders placed or performed during the hospital encounter of 10/30/19  SARS Coronavirus 2 by RT PCR (hospital order, performed in [facility] hospital lab) Nasopharyngeal Nasopharyngeal Swab     Status: None   Collection Time: 10/30/19  5:04 PM   Specimen: Nasopharyngeal Swab  Result Value Ref Range Status   SARS Coronavirus 2 NEGATIVE NEGATIVE Final    Comment: (NOTE) SARS-CoV-2 target nucleic acids are NOT DETECTED.  The SARS-CoV-2 RNA is generally detectable in upper and lower respiratory specimens during the acute phase of infection. The lowest concentration of SARS-CoV-2 viral copies this assay can detect is 250 copies / mL. A negative result does not preclude SARS-CoV-2 infection and should not be used as the sole basis for treatment or other patient management decisions.  A negative result may occur with improper specimen collection / handling, submission of specimen other than nasopharyngeal swab, presence of viral mutation(s) within the areas targeted by this assay, and inadequate number of viral copies (<250 copies / mL). A negative result must be combined with clinical observations,  patient history, and epidemiological information.  Fact Sheet for Patients:   StrictlyIdeas.no  Fact Sheet for Healthcare Providers: BankingDealers.co.za  This test is not yet approved or  cleared by the [place] FDA and has been authorized for detection and/or diagnosis of SARS-CoV-2 by FDA under an Emergency Use Authorization (EUA).  This EUA will remain in effect (meaning this test can be used) for the duration of the COVID-19 declaration under Section 564(b)(1) of the Act, 21 U.S.C. section 360bbb-3(b)(1), unless the authorization is terminated or revoked sooner.  Performed at [facility], [address], [place], [place] [address]   Blood culture (routine x 2)  Status: None   Collection Time: 10/30/19  5:26 PM   Specimen: BLOOD  Result Value Ref Range Status   Specimen Description BLOOD BLOOD LEFT HAND  Final   Special Requests   Final    BOTTLES DRAWN AEROBIC AND ANAEROBIC Blood Culture results may not be optimal due to an inadequate volume of blood received in culture bottles   Culture   Final    NO GROWTH 5 DAYS Performed at [facility], [address] [place], [place] [address]    Report Status 11/04/2019 FINAL  Final  Blood culture (routine x 2)     Status: None   Collection Time: 10/30/19  5:36 PM   Specimen: BLOOD  Result Value Ref Range Status   Specimen Description BLOOD BLOOD RIGHT WRIST  Final   Special Requests   Final    AEROBIC BOTTLE ONLY Blood Culture results may not be optimal due to an inadequate volume of blood received in culture bottles   Culture   Final    NO GROWTH 5 DAYS Performed at [facility], [address], [place], [place] [address]    Report Status 11/04/2019 FINAL  Final  Urine culture     Status: None   Collection Time: 10/30/19 11:18 PM   Specimen: Urine, Random  Result Value Ref Range Status   Specimen Description   Final    URINE,  RANDOM Performed at [facility], [address] [place], [place] [address]    Special Requests   Final    NONE Performed at [facility], [address] [place], [place] [address]    Culture   Final    NO GROWTH Performed at [facility], [place] [address], [place], [place] [address]    Report Status 11/01/2019 FINAL  Final  MRSA PCR Screening     Status: None   Collection Time: 10/31/19  1:42 AM   Specimen: Nasal Mucosa; Nasopharyngeal  Result Value Ref Range Status   MRSA by PCR NEGATIVE NEGATIVE Final    Comment:        The GeneXpert MRSA Assay (FDA approved for NASAL specimens only), is one component of a comprehensive MRSA colonization surveillance program. It is not intended to diagnose MRSA infection nor to guide or monitor treatment for MRSA infections. Performed at [facility], [address] [place], [place] [address]     Coagulation Studies: No results for input(s): LABPROT, INR in the last 72 hours.  Urinalysis: No results for input(s): COLORURINE, LABSPEC, PHURINE, GLUCOSEU, HGBUR, BILIRUBINUR, KETONESUR, PROTEINUR, UROBILINOGEN, NITRITE, LEUKOCYTESUR in the last 168 hours.  Invalid input(s): APPERANCEUR  Lipid Panel:     Component Value Date/Time   CHOL 150 08/02/2019 1015   TRIG 163 (H) 10/31/2019 0516   HDL 45 08/02/2019 1015   CHOLHDL 3.5 03/03/2019 1034   LDLCALC 84 08/02/2019 1015    HgbA1C:  Lab Results  Component Value Date   HGBA1C 6.5 (H) 10/31/2019    Urine Drug Screen:  No results found for: LABOPIA, COCAINSCRNUR, LABBENZ, AMPHETMU, THCU, LABBARB  Alcohol Level: No results for input(s): ETH in the last 168 hours.  Other results: EKG: normal EKG, normal sinus rhythm, unchanged from previous tracings.  Imaging: No results found.   Assessment/Plan:   64 year old with history of hypothyroidism, stage IV lung cancer with brain mets hypertension, hyperlipidemia, depression, cervical cancer  presents to [facility] on 10/30/2019 by EMS from home for diarrhea and right lower quadrant pain for 7  to 9 days.  She underwent a CT abdomen and pelvis revealing perforated bowel with small to moderate amount of pneumoperitoneum within the abdomen and pelvis.   Neurology consulted for suspected seizures. Pt states she had muscle twitches on L side that subsided after few seconds/minutes. Prior hx of similar episodes in past.    - Pt does have reason to have seizures given the hx of mets and is on Keppra - from description this sounds more like a hemifacial spasm rather then seizure activity.  - symptoms were brief, no LOC - partial seizures are usually longer lasting - No change in anti epileptic at this time  11/04/2019, 11:53 AM

## 2019-11-04 NOTE — Progress Notes (Signed)
PROGRESS NOTE    Kristina Wright  XFG:[id number] DOB: 07-26-1955 DOA: 10/30/2019 PCP: Juline Patch, MD   Chief Complaint  Patient presents with  . Diarrhea    Brief Narrative:   64 year old with history of hypothyroidism, stage IV lung cancer with brain mets hypertension, hyperlipidemia, depression, cervical cancer presents to [facility] on 10/30/2019 by EMS from home for diarrhea and right lower quadrant pain for 7 to 9 days.  She underwent a CT abdomen and pelvis revealing perforated bowel with small to moderate amount of pneumoperitoneum within the abdomen and pelvis.  General surgery consulted and she underwent emergent exploratory laparotomy with lysis of additions, abdominal washout and sigmoid colon resection with colostomy by Dr. Lysle Pearl on 7/11.  Intraoperatively she was intubated and transferred to ICU.  She was extubated on 10/31/19 and transferred to [facility] service. NG tube was removed post op day 1 And was started on clear liquid diet.  She is able to tolerate clear liquid diet without any nausea or vomiting.   Assessment & Plan:   Active Problems:   Perforated bowel ([place])   Pressure injury of skin   Perforated diverticulitis s/p emergent exploratory laparotomy with lysis of adhesions, sigmoid colon resection with colostomy by Dr. Lysle Pearl and mid line wound infection.  NG TUBE removed on 11/02/19 and clear liquid diet started. Currently on clears and able to tolerate.  Further diet advancement as per general surgery. Resume pain control and IV Zosyn. Patient remains afebrile WBC count within normal limits today.    Essential hypertension Well-controlled blood pressure parameters   Stage IV lung cancer and mets Outpatient follow-up with oncology Continue with Keppra for seizure prophylaxis.     Anxiety and depression -Started the patient on her home meds BuSpar and Prozac. Patient is on Xanax at home which has been held as she was sleepy this morning.   Acute anemia of  blood loss Baseline hemoglobin around 12.  Hemoglobin dropped from 11.2 to 9.3 to 9.1 to 8.6 to 9/8 to 9.7 Transfuse to keep hemoglobin in the 7. Anemia panel revealed low iron levels at 24, elevated ferritin, folate of 11.2 and vitamin B12 664.  Iron supplementation will be added once she is able to take soft diet.   Twitching around the mouth  Resolved spontaneously Neurology consulted to evaluate for seizures..   Pressure injury Pressure Injury 11/01/19 Sacrum Mid Stage 2 -  Partial thickness loss of dermis presenting as a shallow open injury with a red, pink wound bed without slough. pink (Active)  11/01/19 1856  Location: Sacrum  Location Orientation: Mid  Staging: Stage 2 -  Partial thickness loss of dermis presenting as a shallow open injury with a red, pink wound bed without slough.  Wound Description (Comments): pink  Present on Admission:    Wound care consulted and recommendations given    Mild Thrombocytopenia Platelet count at 1 29,000.  Continue to monitor no signs of bleeding at this time.   Hypokalemia Replaced today   Hypophosphatemia Replaced, repeat phosphorus level at 2.6   Diabetes mellitus.  Well-controlled CBGs CBG (last 3)  Recent Labs    11/04/19 0000 11/04/19 0404 11/04/19 0802  GLUCAP 124* 95 106*   Hemoglobin A1c at 6.5. Continue with sliding scale insulin  DVT prophylaxis: Lovenox Code Status: Full code Family Communication: None at bedside, discussed with daughter over the phone  disposition:   Status is: Inpatient  Remains inpatient appropriate because:IV treatments appropriate due to intensity of illness or inability  to take PO   Dispo: The patient is from: Home              Anticipated d/c is to: Pending              Anticipated d/c date is: 2 days              Patient currently is not medically stable to d/c.       Consultants:   General surgery  Neurology    Procedures: S/P Hartman's Procedure for perforated  Diverticulitis.  Antimicrobials:  Anti-infectives (From admission, onward)   Start     Dose/Rate Route Frequency Ordered Stop   10/31/19 0200  piperacillin-tazobactam (ZOSYN) IVPB 3.375 g     Discontinue     3.375 g 12.5 mL/hr over 240 Minutes Intravenous Every 8 hours 10/30/19 2343     10/30/19 1700  piperacillin-tazobactam (ZOSYN) IVPB 3.375 g        3.375 g 100 mL/hr over 30 Minutes Intravenous  Once 10/30/19 1645 10/30/19 1817       Subjective: Pt very sleepy but able to answer all questions.  Wants to know when we can advance her diet  Objective: Vitals:   11/03/19 0416 11/03/19 1132 11/03/19 1953 11/04/19 0400  BP: 139/79 129/67 138/83 128/65  Pulse: (!) 106 (!) 109 (!) 110 (!) 107  Resp:  19 18 18   Temp: 98 F (36.7 C) 97.9 F (36.6 C) 98 F (36.7 C) 98 F (36.7 C)  TempSrc: Oral  Oral Oral  SpO2: 97% 99% 99% 100%  Weight:      Height:        Intake/Output Summary (Last 24 hours) at 11/04/2019 1032 Last data filed at 11/04/2019 0957 Gross per 24 hour  Intake 0 ml  Output --  Net 0 ml   Filed Weights   10/30/19 1452 10/30/19 2248  Weight: 103 kg 100.6 kg    Examination:  General exam: Sleepy, drowsy but appears comfortable not in any kind of distress. Respiratory system: Clear to auscultation bilaterally, no wheezing or rhonchi Cardiovascular system: S1-S2 heard, tachycardic, no JVD Gastrointestinal system: Abdomen is soft midline incision. intact mildly tender in the lower quadrant, bowel sounds normal, ostomy in place Central nervous system: Sleepy but able to answer all questions, grossly nonfocal Extremities: Stasis ulcers in the bilateral feet,   skin: stasis ulcers on the feet. stage II sacral decubitus ulcer Psychiatry: Flat affect.     Data Reviewed: I have personally reviewed following labs and imaging studies  CBC: Recent Labs  Lab 10/31/19 0516 11/01/19 0747 11/02/19 0433 11/03/19 0644 11/04/19 0442  WBC 15.0* 18.3* 14.5* 11.4* 9.9   HGB 9.3* 9.1* 8.6* 9.8* 9.7*  HCT 27.7* 26.6* 26.5* 29.0* 28.6*  MCV 91.7 90.5 93.0 91.2 90.8  PLT 157 138* 148* 145* 129*    Basic Metabolic Panel: Recent Labs  Lab 10/31/19 0516 11/01/19 0747 11/02/19 0433 11/03/19 0644 11/04/19 0442  NA 131* 135 136 134* 136  K 4.3 3.8 3.4* 3.8 3.1*  CL 104 104 106 102 104  CO2 21* 22 21* 20* 23  GLUCOSE 137* 98 75 113* 92  BUN 29* 27* 23 14 10   CREATININE 0.94 0.94 0.78 0.85 0.73  CALCIUM 7.5* 7.8* 7.5* 7.8* 7.3*  MG 2.0 2.4 2.1 2.1 1.8  PHOS 3.4 2.7 2.2* 2.0* 2.6    GFR: Estimated Creatinine Clearance: 93.3 mL/min (by C-G formula based on SCr of 0.73 mg/dL).  Liver Function Tests: Recent Labs  Lab 10/30/19 1508  AST 26  ALT 43  ALKPHOS 100  BILITOT 0.9  PROT 6.7  ALBUMIN 2.2*    CBG: Recent Labs  Lab 11/03/19 1639 11/03/19 1946 11/04/19 0000 11/04/19 0404 11/04/19 0802  GLUCAP 102* 214* 124* 95 106*     Recent Results (from the past 240 hour(s))  SARS Coronavirus 2 by RT PCR (hospital order, performed in [facility] hospital lab) Nasopharyngeal Nasopharyngeal Swab     Status: None   Collection Time: 10/30/19  5:04 PM   Specimen: Nasopharyngeal Swab  Result Value Ref Range Status   SARS Coronavirus 2 NEGATIVE NEGATIVE Final    Comment: (NOTE) SARS-CoV-2 target nucleic acids are NOT DETECTED.  The SARS-CoV-2 RNA is generally detectable in upper and lower respiratory specimens during the acute phase of infection. The lowest concentration of SARS-CoV-2 viral copies this assay can detect is 250 copies / mL. A negative result does not preclude SARS-CoV-2 infection and should not be used as the sole basis for treatment or other patient management decisions.  A negative result may occur with improper specimen collection / handling, submission of specimen other than nasopharyngeal swab, presence of viral mutation(s) within the areas targeted by this assay, and inadequate number of viral copies (<250 copies / mL). A  negative result must be combined with clinical observations, patient history, and epidemiological information.  Fact Sheet for Patients:   StrictlyIdeas.no  Fact Sheet for Healthcare Providers: BankingDealers.co.za  This test is not yet approved or  cleared by the [place] FDA and has been authorized for detection and/or diagnosis of SARS-CoV-2 by FDA under an Emergency Use Authorization (EUA).  This EUA will remain in effect (meaning this test can be used) for the duration of the COVID-19 declaration under Section 564(b)(1) of the Act, 21 U.S.C. section 360bbb-3(b)(1), unless the authorization is terminated or revoked sooner.  Performed at [facility], [address] [place], [place] [address]   Blood culture (routine x 2)     Status: None   Collection Time: 10/30/19  5:26 PM   Specimen: BLOOD  Result Value Ref Range Status   Specimen Description BLOOD BLOOD LEFT HAND  Final   Special Requests   Final    BOTTLES DRAWN AEROBIC AND ANAEROBIC Blood Culture results may not be optimal due to an inadequate volume of blood received in culture bottles   Culture   Final    NO GROWTH 5 DAYS Performed at [facility], [address], [place], [place] [address]    Report Status 11/04/2019 FINAL  Final  Blood culture (routine x 2)     Status: None   Collection Time: 10/30/19  5:36 PM   Specimen: BLOOD  Result Value Ref Range Status   Specimen Description BLOOD BLOOD RIGHT WRIST  Final   Special Requests   Final    AEROBIC BOTTLE ONLY Blood Culture results may not be optimal due to an inadequate volume of blood received in culture bottles   Culture   Final    NO GROWTH 5 DAYS Performed at [facility], [address], [place], [place] [address]    Report Status 11/04/2019 FINAL  Final  Urine culture     Status: None   Collection Time: 10/30/19 11:18 PM   Specimen: Urine, Random  Result Value Ref Range  Status   Specimen Description   Final    URINE, RANDOM Performed at [facility], [address] [place], [place] [address]  Special Requests   Final    NONE Performed at [facility], [address] [place], [place] [address]    Culture   Final    NO GROWTH Performed at [facility], [place] [address], [place], [place] [address]    Report Status 11/01/2019 FINAL  Final  MRSA PCR Screening     Status: None   Collection Time: 10/31/19  1:42 AM   Specimen: Nasal Mucosa; Nasopharyngeal  Result Value Ref Range Status   MRSA by PCR NEGATIVE NEGATIVE Final    Comment:        The GeneXpert MRSA Assay (FDA approved for NASAL specimens only), is one component of a comprehensive MRSA colonization surveillance program. It is not intended to diagnose MRSA infection nor to guide or monitor treatment for MRSA infections. Performed at [facility], [address] [place], [place] [address]          Radiology Studies: No results found.      Scheduled Meds: . sodium chloride   Intravenous Once  . chlorhexidine  15 mL Mouth Rinse BID  . Chlorhexidine Gluconate Cloth  6 each Topical Daily  . clopidogrel  75 mg Oral Daily  . collagenase   Topical Daily  . enoxaparin (LOVENOX) injection  40 mg Subcutaneous Q24H  . insulin aspart  0-9 Units Subcutaneous Q4H  . mouth rinse  15 mL Mouth Rinse q12n4p  . potassium chloride  40 mEq Oral BID  . sodium hypochlorite  1 application Topical Daily   Continuous Infusions: . sodium chloride 250 mL (11/02/19 1041)  . famotidine (PEPCID) IV 20 mg (11/04/19 0856)  . levETIRAcetam 500 mg (11/04/19 0818)  . piperacillin-tazobactam (ZOSYN)  IV 3.375 g (11/04/19 0212)     LOS: 5 days       Hosie Poisson, MD [facility]   To contact the attending provider between 7A-7P or the covering provider during after hours 7P-7A, please log into the web site [email or web address] and access using universal [facility] password for that web site. If you do not have the password, please call the hospital operator.  11/04/2019, 10:32 AM

## 2019-11-04 NOTE — Consult Note (Signed)
[place] Nurse ostomy follow up Stoma type/location: LLQ colostomy  Dark, dry flush stoma, creasing at 3 and 9 o'clock Stomal assessment/size: 1 3/8" Peristomal assessment: intact  Midline surgical incision with staples.  Treatment options for stomal/peristomal skin: barrier ring and 2 piece pouch Output  Stoma sweat  Ostomy pouching: 2pc. 2 3/4"  Pouch with barrier ring  Education provided: Pouch change performed.  Patient is not able to see stoma.  We discussed twice weekly pouch changes and emptying when 1/3 full.POuch barrier is cut off center to accommodate midline incision and pattern is left in the room.    Supplies left at bedside Enrolled patient in [place] Start Discharge program: Yes will today.   Encouraged to accept topical wound care to legs for enzymatic debridement.  Will not implement [place] boots yet.  No edema to legs noted.  [place] team will follow.  Domenic Moras MSN, RN, FNP-BC CWON Wound, Ostomy, Continence Nurse Pager [phone]

## 2019-11-04 NOTE — TOC Initial Note (Signed)
Transition of Care [facility]) - Initial/Assessment Note    Patient Details  Name: Kristina Wright MRN: [id number] Date of Birth: November 25, 1955  Transition of Care [facility]) CM/SW Contact:    Candie Chroman, LCSW Phone Number: 11/04/2019, 10:12 AM  Clinical Narrative:  CSW met with patient. No supports at bedside. CSW introduced role and explained that PT recommendations would be discussed. Patient is not interested in SNF placement but agreeable to home health. She is already active with [facility] for PT and RN. CSW called [facility] representative to make her aware of patient's preference and current wound care needs. They can also add OT, aide, and Education officer, museum. Patient lives home with her son. Her sisters live next door and she says they will be able to assist with wound care needs on days the home health agency cannot come out. Patient's physical address is [address] [place], [place] [address]. She will need EMS to take her home when discharged. No further concerns. CSW encouraged patient to contact CSW as needed. CSW will continue to follow patient for support and facilitate discharge home once medically stable.                Expected Discharge Plan: [place] Barriers to Discharge: Continued Medical Work up   Patient Goals and CMS Choice     Choice offered to / list presented to : NA  Expected Discharge Plan and Services Expected Discharge Plan: [place] Choice: [place] arrangements for the past 2 months: Single Family Home                           HH Arranged: RN, PT, OT, Nurse's Aide, Social Work [employer] Agency: [place] ([place]) Date [place]: 11/04/19   Representative spoke with at [place]: Johny Sax  Prior Living Arrangements/Services Living arrangements for the past 2 months: [place] with:: Adult Children Patient language and need for interpreter  reviewed:: Yes Do you feel safe going back to the place where you live?: Yes      Need for Family Participation in Patient Care: Yes (Comment) Care giver support system in place?: Yes (comment) Current home services: DME, Home PT, Home RN Criminal Activity/Legal Involvement Pertinent to Current Situation/Hospitalization: No - Comment as needed  Activities of Daily Living   ADL Screening (condition at time of admission) Patient's cognitive ability adequate to safely complete daily activities?: Yes Is the patient deaf or have difficulty hearing?: No Does the patient have difficulty seeing, even when wearing glasses/contacts?: No Does the patient have difficulty concentrating, remembering, or making decisions?: No Patient able to express need for assistance with ADLs?: Yes Does the patient have difficulty dressing or bathing?: Yes  Permission Sought/Granted Permission sought to share information with : Facility Art therapist granted to share information with : Yes, Verbal Permission Granted     Permission granted to share info w AGENCY: [facility]        Emotional Assessment Appearance:: Appears stated age Attitude/Demeanor/Rapport: Lethargic, Engaged Affect (typically observed): Accepting, Appropriate, Calm Orientation: : Oriented to Self, Oriented to Place, Oriented to  Time, Oriented to Situation Alcohol / Substance Use: Not Applicable Psych Involvement: No (comment)  Admission diagnosis:  Bowel perforation ([place]) [K63.1] Perforated bowel ([place]) [K63.1] Patient Active Problem List   Diagnosis Date Noted  . Pressure injury of  skin 11/02/2019  . Perforated bowel ([place]) 10/30/2019  . Bilateral lower extremity edema 10/07/2019  . Herpes zoster without complication 44/61/9012  . Port-A-Cath in place 10/01/2019  . Brain metastasis ([place]) 08/05/2019  . Elevated TSH 06/20/2019  . Chronic obstructive pulmonary disease ([place]) 12/13/2018  . Left lower lobe  pulmonary nodule 11/29/2018  . Breast nodule 08/25/2018  . Anxiety 08/22/2018  . Genetic testing 06/11/2018  . Family history of breast cancer   . Family history of colon cancer   . Family history of ovarian cancer   . Family history of stomach cancer   . Family history of throat cancer   . Metastatic adenocarcinoma to lung with unknown primary site [facility]) 04/27/2018  . Depression 03/12/2018  . Malignant neoplasm of lung ([place]) 03/01/2018  . Goals of care, counseling/discussion 03/01/2018  . Tobacco use disorder 02/08/2016  . Essential hypertension 02/08/2016  . PVD (peripheral vascular disease) ([place]) 02/08/2016  . Blue toe syndrome of right lower extremity ([place]) 02/08/2016  . Adult BMI > 30 07/31/2015  . High risk medication use 07/31/2015  . Carpal tunnel syndrome on both sides 10/30/2014  . Postmenopause atrophic vaginitis 05/15/2014  . Sensory urge incontinence 05/15/2014  . Vitamin D deficiency 04/25/2014  . Extremity pain 11/22/2013  . Numbness 11/22/2013  . Sleep disorder 11/22/2013  . Chronic insomnia 11/10/2013  . Foot pain, left 11/10/2013  . Hypothyroidism 11/10/2013  . Mixed hyperlipidemia 11/10/2013   PCP:  Juline Patch, MD Pharmacy:   [facility] [address] [place] [address] Phone: [phone] Fax: [phone]     Social Determinants of Health (SDOH) Interventions    Readmission Risk Interventions No flowsheet data found.

## 2019-11-05 DIAGNOSIS — K631 Perforation of intestine (nontraumatic): Secondary | ICD-10-CM | POA: Diagnosis not present

## 2019-11-05 LAB — BASIC METABOLIC PANEL
Anion gap: 9 (ref 5–15)
BUN: 10 mg/dL (ref 8–23)
CO2: 25 mmol/L (ref 22–32)
Calcium: 8.1 mg/dL — ABNORMAL LOW (ref 8.9–10.3)
Chloride: 104 mmol/L (ref 98–111)
Creatinine, Ser: 0.75 mg/dL (ref 0.44–1.00)
GFR calc Af Amer: >60 mL/min (ref >60)
GFR calc non Af Amer: >60 mL/min (ref >60)
Glucose, Bld: 109 mg/dL — ABNORMAL HIGH (ref 70–99)
Potassium: 3.6 mmol/L (ref 3.5–5.1)
Sodium: 138 mmol/L (ref 135–145)

## 2019-11-05 LAB — GLUCOSE, CAPILLARY
Glucose-Capillary: 109 mg/dL — ABNORMAL HIGH (ref 70–99)
Glucose-Capillary: 117 mg/dL — ABNORMAL HIGH (ref 70–99)
Glucose-Capillary: 124 mg/dL — ABNORMAL HIGH (ref 70–99)
Glucose-Capillary: 125 mg/dL — ABNORMAL HIGH (ref 70–99)
Glucose-Capillary: 150 mg/dL — ABNORMAL HIGH (ref 70–99)
Glucose-Capillary: 94 mg/dL (ref 70–99)

## 2019-11-05 LAB — PHOSPHORUS: Phosphorus: 2.1 mg/dL — ABNORMAL LOW (ref 2.5–4.6)

## 2019-11-05 LAB — MAGNESIUM: Magnesium: 2 mg/dL (ref 1.7–2.4)

## 2019-11-05 MED ORDER — OXYCODONE HCL 5 MG PO TABS: 5.0000 mg | ORAL_TABLET | ORAL | Status: DC | PRN

## 2019-11-05 MED ORDER — SODIUM PHOSPHATES 45 MMOLE/15ML IV SOLN
30.0000 mmol | Freq: Once | INTRAVENOUS | Status: AC
  Administered 2019-11-05: 30 mmol via INTRAVENOUS
  Filled 2019-11-05: qty 10

## 2019-11-05 MED ORDER — ACETAMINOPHEN 500 MG PO TABS
1000.0000 mg | ORAL_TABLET | Freq: Four times a day (QID) | ORAL | Status: DC | PRN
  Administered 2019-11-05 – 2019-11-09 (×3): 1000 mg via ORAL
  Filled 2019-11-05 (×3): qty 2

## 2019-11-05 NOTE — Progress Notes (Signed)
Physical Therapy Treatment Patient Details Name: Kristina Wright MRN: [id number] DOB: 1956/04/03 Today's Date: 11/05/2019    History of Present Illness 64 yo female admitted with generalized weakness and diarrhea. Found to have bowel perforation s/p exploratory laparotomy requiring bowel resection along with colostomy and remained mechanically intubated postop. Patient extubated 10/31/19. History of lung cancer with brain mets, COPD, bilateral LE wounds     PT Comments    Pt was long sitting in bed upon arriving. She was awake but very lethargic. She refused PT session at first but with encouragement did agree to bed level there ex. C/O 5/10 pain in BLE(feet). RN is aware. Most exercises were AAROM 2/2 to pt's pain/weakness. Pt is very weak throughout all extremities. Lengthy discussion about DC disposition. Pt at first states she wants to go home but after discussion is in agreement that [facility] home is not the safest option. She agrees to going to SNF when medically stable to DC. CM/RN notified. She will benefit form SNF at DC to address strength, balance, and overall safe functional mobility deficits. Pt is very weak and will need extensive PT going forward. She was long sitting in bed with bed alarm in place and call bell in reach. Acute PT will continue to follow per current POC.     Follow Up Recommendations  SNF     Equipment Recommendations  Other (comment) (defer to next level of care)    Recommendations for Other Services       Precautions / Restrictions Precautions Precautions: Fall Restrictions Weight Bearing Restrictions: No    Mobility  Bed Mobility               General bed mobility comments: Pt unwilling to get OOB  Transfers                    Ambulation/Gait                 Stairs             Wheelchair Mobility    Modified Rankin (Stroke Patients Only)       Balance                                             Cognition Arousal/Alertness: Lethargic Behavior During Therapy: WFL for tasks assessed/performed Overall Cognitive Status: Within Functional Limits for tasks assessed                                 General Comments: Pt is awake but very lethargic throughout session. she refused session at first but was agreeable to bed level exercises only. Lengthy discussion about recommendation for DC to SNF. pt does agree. CM/RN notified she is now willing      Exercises      General Comments        Pertinent Vitals/Pain Pain Assessment: 0-10 Pain Score: 5  Pain Location:  (BLES (feet)) Pain Descriptors / Indicators: Constant;Discomfort;Pounding Pain Intervention(s): Limited activity within patient's tolerance;Monitored during session;Repositioned;Patient requesting pain meds-RN notified    Home Living                      Prior Function            PT Goals (current goals can now be  found in the care plan section) Acute Rehab PT Goals Patient Stated Goal: to go home  Progress towards PT goals: Not progressing toward goals - comment (pain/lethargy/willingness to cooperate)    Frequency    Min 2X/week      PT Plan Current plan remains appropriate    Co-evaluation              AM-PAC PT "6 Clicks" Mobility   Outcome Measure  Help needed turning from your back to your side while in a flat bed without using bedrails?: A Lot Help needed moving from lying on your back to sitting on the side of a flat bed without using bedrails?: A Lot Help needed moving to and from a bed to a chair (including a wheelchair)?: A Lot Help needed standing up from a chair using your arms (e.g., wheelchair or bedside chair)?: A Lot Help needed to walk in hospital room?: Total Help needed climbing 3-5 steps with a railing? : Total 6 Click Score: 10    End of Session Equipment Utilized During Treatment: Oxygen Activity Tolerance: Patient limited by pain;Patient  limited by fatigue;Patient limited by lethargy Patient left: in bed;with call bell/phone within reach;with bed alarm set Nurse Communication: Mobility status PT Visit Diagnosis: Muscle weakness (generalized) (M62.81);Difficulty in walking, not elsewhere classified (R26.2)     Time: 0321-2248 PT Time Calculation (min) (ACUTE ONLY): 9 min  Charges:  $Therapeutic Exercise: 8-22 mins                     Julaine Fusi PTA 11/05/19, 11:32 AM

## 2019-11-05 NOTE — TOC Progression Note (Signed)
Transition of Care [facility]) - Progression Note    Patient Details  Name: Kristina Wright MRN: [id number] Date of Birth: 07-06-1955  [facility]) CM/SW Contact  Zigmund Daniel Dorian Pod, RN Phone Number: 11/05/2019, 5:56 PM  Clinical Narrative:    Spoke with pt today concerning SNF recommendation. Pt has agreed to go to a facility for PT as recommended by PT. Pt does not have a preference. Will start this process in AM. MD updated accordingly.   Expected Discharge Plan: [place] Barriers to Discharge: Continued Medical Work up  Expected Discharge Plan and Services Expected Discharge Plan: [place] Choice: [place] arrangements for the past 2 months: Single Family Home                           HH Arranged: RN, PT, OT, Nurse's Aide, Social Work [employer] Agency: [place] ([place]) Date [place]: 11/04/19   Representative spoke with at [place]: [place] ([place]) Interventions    Readmission Risk Interventions Readmission Risk Prevention Plan 11/04/2019  PCP or Specialist Appt within 3-5 Days Complete  HRI or [place] Complete  Social Work Consult for [place] Planning/Counseling Complete  Palliative Care Screening Not Applicable  Some recent data might be hidden

## 2019-11-05 NOTE — Progress Notes (Signed)
11/05/2019  Subjective: Patient is 6 Days Post-Op s/p Hartman's procedure for perforated diverticulitis.  No acute events.  Having stool ostomy output now.  Denies any nausea.  Refused midline dressing change last night.  Vital signs: Temp:  [97.7 F (36.5 C)-98.5 F (36.9 C)] 98.5 F (36.9 C) (07/17 0417) Pulse Rate:  [91-106] 91 (07/17 0417) Resp:  [16-20] 20 (07/17 0417) BP: (123-141)/(66-75) 123/66 (07/17 0417) SpO2:  [98 %-100 %] 100 % (07/17 0417)   Intake/Output: 07/16 0701 - 07/17 0700 In: 1598.3 [P.O.:240; IV Piggyback:1358.3] Out: 1760 [Urine:1550; Drains:10; Stool:200] Last BM Date:  (per ostomy)  Physical Exam: Constitutional:  No acute distress Abdomen:  Soft, non-distended, appropriately tender.  Midline incision open for the most part with some superior and inferior staples in place.  Subcutaneous tissue healthy, with only a bit of purulence.  Wound cleaned and dressed with wet to dry gauze with Dakins solution.  JP drain with serosanguinous fluid.  LLQ ostomy now well visualized under the appliance, but has liquid stool in bag.  Labs:  Recent Labs    11/03/19 0644 11/04/19 0442  WBC 11.4* 9.9  HGB 9.8* 9.7*  HCT 29.0* 28.6*  PLT 145* 129*   Recent Labs    11/04/19 0442 11/05/19 0437  NA 136 138  K 3.1* 3.6  CL 104 104  CO2 23 25  GLUCOSE 92 109*  BUN 10 10  CREATININE 0.73 0.75  CALCIUM 7.3* 8.1*   No results for input(s): LABPROT, INR in the last 72 hours.  Imaging: No results found.  Assessment/Plan: This is a 64 y.o. female s/p Hartman's procedure  --Did wet to dry dressing change.  Wound healing well. --advanced diet to full liquid today.  Possibly advance to soft diet tomorrow. --continue IV abx   Melvyn Neth, [facility]

## 2019-11-05 NOTE — Progress Notes (Signed)
PROGRESS NOTE    Kristina Wright  IRW:[id number] DOB: 11-04-1955 DOA: 10/30/2019 PCP: Juline Patch, MD   Chief Complaint  Patient presents with  . Diarrhea    Brief Narrative:   64 year old with history of hypothyroidism, stage IV lung cancer with brain mets hypertension, hyperlipidemia, depression, cervical cancer presents to [facility] on 10/30/2019 by EMS from home for diarrhea and right lower quadrant pain for 7 to 9 days.  She underwent a CT abdomen and pelvis revealing perforated bowel with small to moderate amount of pneumoperitoneum within the abdomen and pelvis.  General surgery consulted and she underwent emergent exploratory laparotomy with lysis of additions, abdominal washout and sigmoid colon resection with colostomy by Dr. Lysle Pearl on 7/11.  Intraoperatively she was intubated and transferred to ICU.  She was extubated on 10/31/19 and transferred to [facility] service. NG tube was removed post op day 1 And was started on clear liquid diet.  She is able to tolerate clear liquid diet without any nausea or vomiting, diet advanced to full liquid diet.     Assessment & Plan:   Active Problems:   Perforated bowel ([place])   Pressure injury of skin   Perforated diverticulitis s/p emergent exploratory laparotomy with lysis of adhesions, sigmoid colon resection with colostomy by Dr. Lysle Pearl and mid line wound infection.  NG TUBE removed on 11/02/19 and clear liquid diet started. Currently on clears and able to tolerate.  Diet advance to full liquid diet today.  Resume pain control and IV Zosyn. Patient remains afebrile WBC count within normal limits.    Essential hypertension Well-controlled blood pressure parameters   Stage IV lung cancer and mets Outpatient follow-up with oncology Continue with Keppra for seizure prophylaxis.     Anxiety and depression -Started the patient on her home meds BuSpar and Prozac. Continue to hold Xanax as she appears to be sleepy.   Acute anemia of blood  loss Baseline hemoglobin around 12.  Hemoglobin dropped from 11.2 to 9.3 to 9.1 to 8.6 to 9/8 to 9.7 Transfuse to keep hemoglobin in the 7. Anemia panel revealed low iron levels at 24, elevated ferritin, folate of 11.2 and vitamin B12 664.  Iron supplementation will be added once she is able to take soft diet.   Twitching around the mouth  Resolved spontaneously Neurology consulted to evaluate for seizure, suggested patient probably has some facial spasms.  No seizure activity and no indication for further work-up  Pressure injury Pressure Injury 11/01/19 Sacrum Mid Stage 2 -  Partial thickness loss of dermis presenting as a shallow open injury with a red, pink wound bed without slough. pink (Active)  11/01/19 1856  Location: Sacrum  Location Orientation: Mid  Staging: Stage 2 -  Partial thickness loss of dermis presenting as a shallow open injury with a red, pink wound bed without slough.  Wound Description (Comments): pink  Present on Admission:    Wound care consulted and recommendations given    Mild Thrombocytopenia Recheck platelets in the morning   Hypokalemia Replaced and repeat level within normal limits   Hypophosphatemia Replaced, recheck in the morning   Diabetes mellitus.  Well-controlled CBGs CBG (last 3)  Recent Labs    11/05/19 0430 11/05/19 0824 11/05/19 1216  GLUCAP 117* 94 150*   Hemoglobin A1c at 6.5. Continue with sliding scale insulin  DVT prophylaxis: Lovenox Code Status: Full code Family Communication: None at bedside, discussed with daughter over the phone  disposition:   Status is: Inpatient  Remains inpatient  appropriate because:IV treatments appropriate due to intensity of illness or inability to take PO   Dispo: The patient is from: Home              Anticipated d/c is to: Home              Anticipated d/c date is: 2 days              Patient currently is not medically stable to d/c.       Consultants:   General  surgery  Neurology    Procedures: S/P Hartman's Procedure for perforated Diverticulitis.  Antimicrobials:  Anti-infectives (From admission, onward)   Start     Dose/Rate Route Frequency Ordered Stop   10/31/19 0200  piperacillin-tazobactam (ZOSYN) IVPB 3.375 g     Discontinue     3.375 g 12.5 mL/hr over 240 Minutes Intravenous Every 8 hours 10/30/19 2343     10/30/19 1700  piperacillin-tazobactam (ZOSYN) IVPB 3.375 g        3.375 g 100 mL/hr over 30 Minutes Intravenous  Once 10/30/19 1645 10/30/19 1817       Subjective: Patient is more alert today, denies any nausea vomiting or abdominal pain.  Reports her feet are hurting and throbbing after the dressing changes.  Objective: Vitals:   11/04/19 1129 11/04/19 1941 11/05/19 0417 11/05/19 1219  BP: (!) 141/75 135/74 123/66 138/64  Pulse: (!) 106 (!) 103 91 96  Resp: 20 16 20 20   Temp: 98.1 F (36.7 C) 97.7 F (36.5 C) 98.5 F (36.9 C) 98.5 F (36.9 C)  TempSrc: Oral Oral  Oral  SpO2: 100% 98% 100% 99%  Weight:      Height:        Intake/Output Summary (Last 24 hours) at 11/05/2019 1407 Last data filed at 11/05/2019 0900 Gross per 24 hour  Intake 1838.3 ml  Output 1760 ml  Net 78.3 ml   Filed Weights   10/30/19 1452 10/30/19 2248  Weight: 103 kg 100.6 kg    Examination:  General exam: Not in any kind of distress at this time Respiratory system: Clear to auscultation bilaterally, no wheezing or rhonchi Cardiovascular system: S1-S2 heard, regular rate rhythm no JVD Gastrointestinal system: Abdomen is soft, midline incision intact no tenderness, bowel sounds normal Central nervous system: Groggy but able to answer all questions and able to move all extremities, Extremities: Stasis ulcers in the bilateral feet  skin: Stage II sacral decubitus ulcer Psychiatry: Flat affect     Data Reviewed: I have personally reviewed following labs and imaging studies  CBC: Recent Labs  Lab 10/31/19 0516 11/01/19 0747  11/02/19 0433 11/03/19 0644 11/04/19 0442  WBC 15.0* 18.3* 14.5* 11.4* 9.9  HGB 9.3* 9.1* 8.6* 9.8* 9.7*  HCT 27.7* 26.6* 26.5* 29.0* 28.6*  MCV 91.7 90.5 93.0 91.2 90.8  PLT 157 138* 148* 145* 129*    Basic Metabolic Panel: Recent Labs  Lab 11/01/19 0747 11/02/19 0433 11/03/19 0644 11/04/19 0442 11/05/19 0437  NA 135 136 134* 136 138  K 3.8 3.4* 3.8 3.1* 3.6  CL 104 106 102 104 104  CO2 22 21* 20* 23 25  GLUCOSE 98 75 113* 92 109*  BUN 27* 23 14 10 10   CREATININE 0.94 0.78 0.85 0.73 0.75  CALCIUM 7.8* 7.5* 7.8* 7.3* 8.1*  MG 2.4 2.1 2.1 1.8 2.0  PHOS 2.7 2.2* 2.0* 2.6 2.1*    GFR: Estimated Creatinine Clearance: 93.3 mL/min (by C-G formula based on SCr of 0.75 mg/dL).  Liver Function Tests: Recent Labs  Lab 10/30/19 1508  AST 26  ALT 43  ALKPHOS 100  BILITOT 0.9  PROT 6.7  ALBUMIN 2.2*    CBG: Recent Labs  Lab 11/04/19 1948 11/05/19 0002 11/05/19 0430 11/05/19 0824 11/05/19 1216  GLUCAP 113* 125* 117* 94 150*     Recent Results (from the past 240 hour(s))  SARS Coronavirus 2 by RT PCR (hospital order, performed in [facility] hospital lab) Nasopharyngeal Nasopharyngeal Swab     Status: None   Collection Time: 10/30/19  5:04 PM   Specimen: Nasopharyngeal Swab  Result Value Ref Range Status   SARS Coronavirus 2 NEGATIVE NEGATIVE Final    Comment: (NOTE) SARS-CoV-2 target nucleic acids are NOT DETECTED.  The SARS-CoV-2 RNA is generally detectable in upper and lower respiratory specimens during the acute phase of infection. The lowest concentration of SARS-CoV-2 viral copies this assay can detect is 250 copies / mL. A negative result does not preclude SARS-CoV-2 infection and should not be used as the sole basis for treatment or other patient management decisions.  A negative result may occur with improper specimen collection / handling, submission of specimen other than nasopharyngeal swab, presence of viral mutation(s) within the areas  targeted by this assay, and inadequate number of viral copies (<250 copies / mL). A negative result must be combined with clinical observations, patient history, and epidemiological information.  Fact Sheet for Patients:   StrictlyIdeas.no  Fact Sheet for Healthcare Providers: BankingDealers.co.za  This test is not yet approved or  cleared by the [place] FDA and has been authorized for detection and/or diagnosis of SARS-CoV-2 by FDA under an Emergency Use Authorization (EUA).  This EUA will remain in effect (meaning this test can be used) for the duration of the COVID-19 declaration under Section 564(b)(1) of the Act, 21 U.S.C. section 360bbb-3(b)(1), unless the authorization is terminated or revoked sooner.  Performed at [facility], [address] [place], [place] [address]   Blood culture (routine x 2)     Status: None   Collection Time: 10/30/19  5:26 PM   Specimen: BLOOD  Result Value Ref Range Status   Specimen Description BLOOD BLOOD LEFT HAND  Final   Special Requests   Final    BOTTLES DRAWN AEROBIC AND ANAEROBIC Blood Culture results may not be optimal due to an inadequate volume of blood received in culture bottles   Culture   Final    NO GROWTH 5 DAYS Performed at [facility], [address], [place], [place] [address]    Report Status 11/04/2019 FINAL  Final  Blood culture (routine x 2)     Status: None   Collection Time: 10/30/19  5:36 PM   Specimen: BLOOD  Result Value Ref Range Status   Specimen Description BLOOD BLOOD RIGHT WRIST  Final   Special Requests   Final    AEROBIC BOTTLE ONLY Blood Culture results may not be optimal due to an inadequate volume of blood received in culture bottles   Culture   Final    NO GROWTH 5 DAYS Performed at [facility], [address] [place], [place] [address]    Report Status 11/04/2019 FINAL  Final  Urine culture     Status: None    Collection Time: 10/30/19 11:18 PM   Specimen: Urine, Random  Result Value Ref Range Status   Specimen Description   Final    URINE, RANDOM Performed at [facility], [address] [place], [place] [address]    Special Requests   Final    NONE Performed at [facility], [address] [place], [place] [address]    Culture   Final    NO GROWTH Performed at [facility], [place] [address], [place], [place] [address]    Report Status 11/01/2019 FINAL  Final  MRSA PCR Screening     Status: None   Collection Time: 10/31/19  1:42 AM   Specimen: Nasal Mucosa; Nasopharyngeal  Result Value Ref Range Status   MRSA by PCR NEGATIVE NEGATIVE Final    Comment:        The GeneXpert MRSA Assay (FDA approved for NASAL specimens only), is one component of a comprehensive MRSA colonization surveillance program. It is not intended to diagnose MRSA infection nor to guide or monitor treatment for MRSA infections. Performed at [facility], [address], [place], [place] [address]          Radiology Studies: DG Abd 1 View  Result Date: 11/04/2019 CLINICAL DATA:  Ileus. EXAM: ABDOMEN - 1 VIEW COMPARISON:  October 30, 2019. FINDINGS: The bowel gas pattern is normal. Ostomy is noted in left lower quadrant. Surgical drain is noted in the pelvis. IMPRESSION: No evidence of bowel obstruction or ileus. Electronically Signed   By: Marijo Conception M.D.   On: 11/04/2019 13:07        Scheduled Meds: . sodium chloride   Intravenous Once  . aspirin EC  81 mg Oral Daily  . atorvastatin  10 mg Oral Daily  . busPIRone  5 mg Oral Daily  . chlorhexidine  15 mL Mouth Rinse BID  . Chlorhexidine Gluconate Cloth  6 each Topical Daily  . clopidogrel  75 mg Oral Daily  . collagenase   Topical Daily  . enoxaparin (LOVENOX) injection  40 mg Subcutaneous Q24H  . feeding supplement  1 Container Oral TID BM  . FLUoxetine  10 mg Oral Daily  . furosemide  20 mg Oral BID  .  insulin aspart  0-9 Units Subcutaneous Q4H  . levothyroxine  112 mcg Oral Daily  . mouth rinse  15 mL Mouth Rinse q12n4p  . umeclidinium-vilanterol  1 puff Inhalation Daily   Continuous Infusions: . sodium chloride 250 mL (11/02/19 1041)  . famotidine (PEPCID) IV 20 mg (11/05/19 0911)  . levETIRAcetam 500 mg (11/05/19 0801)  . piperacillin-tazobactam (ZOSYN)  IV 3.375 g (11/05/19 1034)     LOS: 6 days       Hosie Poisson, MD [facility]   To contact the attending provider between 7A-7P or the covering provider during after hours 7P-7A, please log into the web site [email or web address] and access using universal [place] password for that web site. If you do not have the password, please call the hospital operator.  11/05/2019, 2:07 PM

## 2019-11-06 ENCOUNTER — Inpatient Hospital Stay: Payer: 59

## 2019-11-06 DIAGNOSIS — C3490 Malignant neoplasm of unspecified part of unspecified bronchus or lung: Secondary | ICD-10-CM

## 2019-11-06 DIAGNOSIS — D649 Anemia, unspecified: Secondary | ICD-10-CM

## 2019-11-06 DIAGNOSIS — G936 Cerebral edema: Secondary | ICD-10-CM

## 2019-11-06 DIAGNOSIS — C7931 Secondary malignant neoplasm of brain: Secondary | ICD-10-CM

## 2019-11-06 DIAGNOSIS — K631 Perforation of intestine (nontraumatic): Secondary | ICD-10-CM | POA: Diagnosis not present

## 2019-11-06 DIAGNOSIS — K5720 Diverticulitis of large intestine with perforation and abscess without bleeding: Principal | ICD-10-CM

## 2019-11-06 DIAGNOSIS — Z7189 Other specified counseling: Secondary | ICD-10-CM

## 2019-11-06 LAB — BASIC METABOLIC PANEL
Anion gap: 11 (ref 5–15)
BUN: 8 mg/dL (ref 8–23)
CO2: 22 mmol/L (ref 22–32)
Calcium: 7.5 mg/dL — ABNORMAL LOW (ref 8.9–10.3)
Chloride: 105 mmol/L (ref 98–111)
Creatinine, Ser: 0.8 mg/dL (ref 0.44–1.00)
GFR calc Af Amer: >60 mL/min (ref >60)
GFR calc non Af Amer: >60 mL/min (ref >60)
Glucose, Bld: 104 mg/dL — ABNORMAL HIGH (ref 70–99)
Potassium: 3.6 mmol/L (ref 3.5–5.1)
Sodium: 138 mmol/L (ref 135–145)

## 2019-11-06 LAB — GLUCOSE, CAPILLARY
Glucose-Capillary: 104 mg/dL — ABNORMAL HIGH (ref 70–99)
Glucose-Capillary: 109 mg/dL — ABNORMAL HIGH (ref 70–99)
Glucose-Capillary: 130 mg/dL — ABNORMAL HIGH (ref 70–99)
Glucose-Capillary: 186 mg/dL — ABNORMAL HIGH (ref 70–99)
Glucose-Capillary: 193 mg/dL — ABNORMAL HIGH (ref 70–99)
Glucose-Capillary: 97 mg/dL (ref 70–99)

## 2019-11-06 LAB — CBC
HCT: 27.7 % — ABNORMAL LOW (ref 36.0–46.0)
Hemoglobin: 9.4 g/dL — ABNORMAL LOW (ref 12.0–15.0)
MCH: 30.7 pg (ref 26.0–34.0)
MCHC: 33.9 g/dL (ref 30.0–36.0)
MCV: 90.5 fL (ref 80.0–100.0)
Platelets: 135 10*3/uL — ABNORMAL LOW (ref 150–400)
RBC: 3.06 MIL/uL — ABNORMAL LOW (ref 3.87–5.11)
RDW: 16.4 % — ABNORMAL HIGH (ref 11.5–15.5)
WBC: 10.3 10*3/uL (ref 4.0–10.5)
nRBC: 0 % (ref 0.0–0.2)

## 2019-11-06 LAB — RETIC PANEL
Immature Retic Fract: 40.4 % — ABNORMAL HIGH (ref 2.3–15.9)
RBC.: 3.44 MIL/uL — ABNORMAL LOW (ref 3.87–5.11)
Retic Count, Absolute: 68.5 10*3/uL (ref 19.0–186.0)
Retic Ct Pct: 2 % (ref 0.4–3.1)
Reticulocyte Hemoglobin: 30.7 pg (ref >27.9)

## 2019-11-06 LAB — PHOSPHORUS: Phosphorus: 2.8 mg/dL (ref 2.5–4.6)

## 2019-11-06 LAB — MAGNESIUM: Magnesium: 1.8 mg/dL (ref 1.7–2.4)

## 2019-11-06 MED ORDER — DEXAMETHASONE SODIUM PHOSPHATE 10 MG/ML IJ SOLN
10.0000 mg | Freq: Two times a day (BID) | INTRAMUSCULAR | Status: DC
  Administered 2019-11-06 – 2019-11-08 (×5): 10 mg via INTRAVENOUS
  Filled 2019-11-06 (×6): qty 1

## 2019-11-06 MED ORDER — SODIUM CHLORIDE 0.9% FLUSH
10.0000 mL | Freq: Two times a day (BID) | INTRAVENOUS | Status: DC
  Administered 2019-11-06 – 2019-11-20 (×27): 10 mL

## 2019-11-06 MED ORDER — SODIUM CHLORIDE 0.9% FLUSH: 10.0000 mL | INTRAVENOUS | Status: DC | PRN

## 2019-11-06 MED ORDER — MORPHINE SULFATE (PF) 2 MG/ML IV SOLN
1.0000 mg | INTRAVENOUS | Status: DC | PRN
  Administered 2019-11-06 – 2019-11-21 (×72): 2 mg via INTRAVENOUS
  Filled 2019-11-06 (×75): qty 1

## 2019-11-06 NOTE — Progress Notes (Signed)
Pt arrived on unit at this time. Pt is alert and oriented X4 but drowsy. Tachycardic on monitor, all other VSS. Pt was just made DNR/comfort care by family and palliative consulted. MD also at bedside discussing with patient and family members. Colostomy and JP drain assessed.

## 2019-11-06 NOTE — Progress Notes (Signed)
1920: Pt resting, son is at bedside.   2000: Pt son is going home and said he would be back tomorrow. Gave him the option to stay later since he wasn't able to arrive till 1800, but said his mom had been sleeping since he showed up and would be back in the morning.   2100 Pt c/o pain, morphine given and pt quickly fell back to sleep.  2125 Pt sister called for an update and hopes [name] makes it through the night so the pastor can come in and pray with her.   0315 Pt awake, states she is comfortable and not in pain.   0630 Noticed red rash on right side. NP Hassan Rowan notified

## 2019-11-06 NOTE — Progress Notes (Signed)
   11/06/19 1518  Clinical Encounter Type  Visited With Patient and family together  Visit Type Follow-up  Referral From Nurse  Consult/Referral To Chaplain  Spiritual Encounters  Spiritual Needs Prayer;Emotional  [place] provided pastoral presence for pt's family who were in the ICU family waiting room upon arrival. [facility] lead family to pt's room when staff was ready for visitors. Pt was resting on hospital bed with its head raised several inches upon arrival. Pt seemed tired. Was talkative. Able to make wishes known. Participated in life review and understood that pt works for her [employer]. Her senior pastor is currently out of town and Public house manager not available. [place] prayed with patient and family. Family was tearful. Pastoral visit was appreciated. No further needs are expressed at this time.

## 2019-11-06 NOTE — Progress Notes (Signed)
11/06/2019  Subjective: Patient is 7 Days Post-Op s/p hartman's procedure for perforated diverticulitis.  Discussed with her RN this morning that she has been more somnolent this morning.  I'm able to talk with her and she reports she feels cold.  Denies any nausea or worsening abdominal pain.  WBC stable at 10.3 this morning.  Vital signs: Temp:  [97.4 F (36.3 C)-98.5 F (36.9 C)] 97.4 F (36.3 C) (07/18 0514) Pulse Rate:  [82-96] 82 (07/18 0514) Resp:  [16-20] 16 (07/18 0514) BP: (131-138)/(61-64) 132/61 (07/18 0514) SpO2:  [97 %-99 %] 99 % (07/18 0514)   Intake/Output: 07/17 0701 - 07/18 0700 In: 1152.7 [P.O.:360; IV Piggyback:792.7] Out: 1015 [Urine:900; Drains:15; Stool:100] Last BM Date: 11/05/19  Physical Exam: Constitutional: No acute distress, appears tired Abdomen:  Soft, non-distended, appropriately tender to palpation.  Midline incision still with some seropurulent fluid.  When tracking this better, it tracks superiorly.  Three more staples were removed from the superior portion around the umbilicus, exposing the wound better.  No necrotic tissue.  The wound was then dressed with wet to dry gauze with Dakins solution.  Drain remains serosanguinous.  Ostomy with stool in bag.  Labs:  Recent Labs    11/04/19 0442 11/06/19 0735  WBC 9.9 10.3  HGB 9.7* 9.4*  HCT 28.6* 27.7*  PLT 129* 135*   Recent Labs    11/05/19 0437 11/06/19 0735  NA 138 138  K 3.6 3.6  CL 104 105  CO2 25 22  GLUCOSE 109* 104*  BUN 10 8  CREATININE 0.75 0.80  CALCIUM 8.1* 7.5*   No results for input(s): LABPROT, INR in the last 72 hours.  Imaging: No results found.  Assessment/Plan: This is a 64 y.o. female s/p Hartman's procedure  --Midline wound opened for a few more staples superiorly given the seropurulent output.  Dressed with Dakins dressing. --ostomy with stool output.  Would otherwise advance to a soft diet, but given her somnolence, would hold off until she's more  awake.   Melvyn Neth, [facility]

## 2019-11-06 NOTE — Progress Notes (Signed)
Pt resting in bed quietly with eyes closed. Pt is alert and oriented. Pt's daughter and sister is at bedside. Pt is comfort care with morphine ordered PRN Q2H. VSS at this time on room air. HR 109.

## 2019-11-06 NOTE — Consult Note (Signed)
Hematology/Oncology Consult note [facility] Telephone:([phone] [phone]  Patient Care Team: Juline Patch, MD as PCP - General (Family Medicine) Lequita Asal, MD as Referring Physician (Hematology and Oncology) Delana Meyer, Dolores Lory, MD (Vascular Surgery) Noreene Filbert, MD as Radiation Oncologist (Radiation Oncology)   Name of the patient: Kristina Wright  [id number]  1955-07-12   Date of visit: 11/06/19 REASON FOR COSULTATION:  Lung cancer History of presenting illness-  64 y.o. female with PMH most significant for stage IV lung cancer with brain metastasis, status post surgery and radiation, on oral chemotherapy treatments presents emergency room for evaluation of diarrhea and right lower quadrant pain. 10/30/2019, CT abdomen pelvis showed bowel perforation with small to moderate amount of pneumoperitoneum within the abdomen and pelvis. Patient underwent emergent exploratory laparotomy with lysis of adhesions, sigmoid colon resection with colostomy by Dr. Lysle Pearl and midline wound infection.  NG tube was removed on 11/02/2019, clear liquid diet started. Patient is on IV Zosyn for wound infection. Patient developed somnolence, and 11/06/2019 CT head showed right frontal metastasis with extensive surrounding vasogenic edema.  9 mm midline shift to the left.  Findings are similar to prior MRI. 09/29/2019, she had MRI brain done which showed enhancement at the site of solitary right frontal operculum metastasis, 3.4 cm.  Of previously discussed with radiation oncology and Dr. 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  No additional radiation was planned  Heme-onc was consulted today for further evaluation and management, goal of care discussion. Patient was seen and evaluated by me at the bedside.  No family members at the bedside.  I attempted to call her daughter and not able to reach her.  Patient reports some headache,  no nausea vomiting.  She denies pain at this point.  She feels weak  Review of Systems  Constitutional: Positive for fatigue. Negative for chills and fever.  HENT:   Negative for hearing loss and voice change.   Eyes: Negative for eye problems.  Respiratory: Negative for chest tightness, cough and shortness of breath.   Cardiovascular: Negative for chest pain.  Gastrointestinal: Negative for abdominal distention, abdominal pain and blood in stool.  Endocrine: Negative for hot flashes.  Genitourinary: Negative for difficulty urinating and frequency.   Musculoskeletal: Negative for arthralgias.  Skin:       Wound infection  Neurological: Negative for extremity weakness.  Hematological: Negative for adenopathy.  Psychiatric/Behavioral: Negative for confusion.    No Known Allergies  Patient Active Problem List   Diagnosis Date Noted  . Pressure injury of skin 11/02/2019  . Perforated bowel ([place]) 10/30/2019  . Bilateral lower extremity edema 10/07/2019  . Herpes zoster without complication 96/29/5284  . Port-A-Cath in place 10/01/2019  . Brain metastasis ([place]) 08/05/2019  . Elevated TSH 06/20/2019  . Chronic obstructive pulmonary disease ([place]) 12/13/2018  . Left lower lobe pulmonary nodule 11/29/2018  . Breast nodule 08/25/2018  . Anxiety 08/22/2018  . Genetic testing 06/11/2018  . Family history of breast cancer   . Family history of colon cancer   . Family history of ovarian cancer   . Family history of stomach cancer   . Family history of throat cancer   . Metastatic adenocarcinoma to lung with unknown primary site [facility]) 04/27/2018  . Depression 03/12/2018  . Malignant neoplasm of lung ([place]) 03/01/2018  . Goals of care, counseling/discussion 03/01/2018  . Tobacco use disorder 02/08/2016  . Essential hypertension 02/08/2016  . PVD (peripheral vascular disease) (  [place]) 02/08/2016  . Blue toe syndrome of right lower extremity ([place]) 02/08/2016  . Adult BMI > 30 07/31/2015  .  High risk medication use 07/31/2015  . Carpal tunnel syndrome on both sides 10/30/2014  . Postmenopause atrophic vaginitis 05/15/2014  . Sensory urge incontinence 05/15/2014  . Vitamin D deficiency 04/25/2014  . Extremity pain 11/22/2013  . Numbness 11/22/2013  . Sleep disorder 11/22/2013  . Chronic insomnia 11/10/2013  . Foot pain, left 11/10/2013  . Hypothyroidism 11/10/2013  . Mixed hyperlipidemia 11/10/2013     Past Medical History:  Diagnosis Date  . Cancer ([place]) 1989   cervical  . Depression   . Family history of breast cancer   . Family history of colon cancer   . Family history of ovarian cancer   . Family history of stomach cancer   . Family history of throat cancer   . Hyperlipidemia   . Hypertension   . Malignant neoplasm of lung ([place]) 03/01/2018  . Thyroid disease      Past Surgical History:  Procedure Laterality Date  . BILATERAL CARPAL TUNNEL RELEASE    . BOWEL RESECTION N/A 10/30/2019   Procedure: SMALL BOWEL RESECTION;  Surgeon: Benjamine Sprague, DO;  Location: [facility] ORS;  Service: General;  Laterality: N/A;  . BREAST CYST ASPIRATION Left   . LAPAROTOMY N/A 10/30/2019   Procedure: EXPLORATORY LAPAROTOMY;  Surgeon: Benjamine Sprague, DO;  Location: [facility] ORS;  Service: General;  Laterality: N/A;  . LUNG BIOPSY    . PARTIAL HYSTERECTOMY    . PORTA CATH INSERTION N/A 03/03/2018   Procedure: PORTA CATH INSERTION;  Surgeon: Katha Cabal, MD;  Location: [facility];  Service: Cardiovascular;  Laterality: N/A;  . TUBAL LIGATION      Social History   Socioeconomic History  . Marital status: Single    Spouse name: Not on file  . Number of children: 3  . Years of education: Not on file  . Highest education level: Not on file  Occupational History  . Not on file  Tobacco Use  . Smoking status: Former Smoker    Packs/day: 1.00    Years: 25.00    Pack years: 25.00    Types: Cigarettes    Quit date: 11/19/2017    Years since quitting: 1.9  .  Smokeless tobacco: Never Used  Vaping Use  . Vaping Use: Never used  Substance and Sexual Activity  . Alcohol use: No  . Drug use: Yes    Types: Marijuana    Comment: occasional  . Sexual activity: Not on file  Other Topics Concern  . Not on file  Social History Narrative  . Not on file   Social Determinants of Health   Financial Resource Strain:   . Difficulty of Paying Living Expenses:   Food Insecurity:   . Worried About Charity fundraiser in the Last Year:   . Arboriculturist in the Last Year:   Transportation Needs:   . Film/video editor (Medical):   Marland Kitchen Lack of Transportation (Non-Medical):   Physical Activity:   . Days of Exercise per Week:   . Minutes of Exercise per Session:   Stress:   . Feeling of Stress :   Social Connections:   . Frequency of Communication with Friends and Family:   . Frequency of Social Gatherings with Friends and Family:   . Attends Religious Services:   . Active Member of Clubs or Organizations:   . Attends Club  or Organization Meetings:   Marland Kitchen Marital Status:   Intimate Partner Violence:   . Fear of Current or Ex-Partner:   . Emotionally Abused:   Marland Kitchen Physically Abused:   . Sexually Abused:      Family History  Problem Relation Age of Onset  . Colon cancer Mother 63  . Colon cancer Maternal Grandmother 78  . Hypertension Father   . Breast cancer Sister 67  . Throat cancer Brother   . Heart attack Maternal Aunt   . Ovarian cancer Maternal Aunt 70  . Stomach cancer Paternal Grandmother        dx >50  . Throat cancer Brother      Current Facility-Administered Medications:  .  0.9 %  sodium chloride infusion (Manually program via Guardrails IV Fluids), , Intravenous, Once, Sakai, Isami, DO .  0.9 %  sodium chloride infusion, , Intravenous, PRN, Sakai, Isami, DO, Last Rate: 5 mL/hr at 11/06/19 1145, 250 mL at 11/06/19 1145 .  acetaminophen (TYLENOL) tablet 1,000 mg, 1,000 mg, Oral, Q6H PRN, Piscoya, Jose, MD, 1,000 mg at  11/05/19 2031 .  albuterol (PROVENTIL) (2.5 MG/3ML) 0.083% nebulizer solution 2.5 mg, 2.5 mg, Nebulization, Q6H PRN, Hosie Poisson, MD .  aspirin EC tablet 81 mg, 81 mg, Oral, Daily, Hosie Poisson, MD, 81 mg at 11/05/19 0900 .  atorvastatin (LIPITOR) tablet 10 mg, 10 mg, Oral, Daily, Hosie Poisson, MD, 10 mg at 11/05/19 0900 .  busPIRone (BUSPAR) tablet 5 mg, 5 mg, Oral, Daily, Hosie Poisson, MD, 5 mg at 11/05/19 0859 .  chlorhexidine (PERIDEX) 0.12 % solution 15 mL, 15 mL, Mouth Rinse, BID, Kasa, Kurian, MD, 15 mL at 11/05/19 2031 .  Chlorhexidine Gluconate Cloth 2 % PADS 6 each, 6 each, Topical, Daily, Awilda Bill, NP, 6 each at 11/06/19 1000 .  clopidogrel (PLAVIX) tablet 75 mg, 75 mg, Oral, Daily, Hosie Poisson, MD, 75 mg at 11/05/19 0900 .  collagenase (SANTYL) ointment, , Topical, Daily, Robinhood, Isami, DO, Given at 11/06/19 1143 .  dexamethasone (DECADRON) injection 10 mg, 10 mg, Intravenous, Q12H, Karleen Hampshire, Vijaya, MD .  enoxaparin (LOVENOX) injection 40 mg, 40 mg, Subcutaneous, Q24H, Sakai, Isami, DO, 40 mg at 11/05/19 2015 .  famotidine (PEPCID) IVPB 20 mg premix, 20 mg, Intravenous, Q12H, Blakeney, Dana G, NP, Last Rate: 100 mL/hr at 11/06/19 1244, 20 mg at 11/06/19 1244 .  feeding supplement (BOOST / RESOURCE BREEZE) liquid 1 Container, 1 Container, Oral, TID BM, Hosie Poisson, MD, 1 Container at 11/06/19 1407 .  FLUoxetine (PROZAC) capsule 10 mg, 10 mg, Oral, Daily, Karleen Hampshire, Vijaya, MD, 10 mg at 11/05/19 0900 .  furosemide (LASIX) tablet 20 mg, 20 mg, Oral, BID, Hosie Poisson, MD, 20 mg at 11/05/19 1842 .  insulin aspart (novoLOG) injection 0-9 Units, 0-9 Units, Subcutaneous, Q4H, Awilda Bill, NP, 1 Units at 11/06/19 0017 .  ipratropium-albuterol (DUONEB) 0.5-2.5 (3) MG/3ML nebulizer solution 3 mL, 3 mL, Nebulization, Q6H PRN, Awilda Bill, NP .  labetalol (NORMODYNE) injection 10 mg, 10 mg, Intravenous, Q2H PRN, Harold Hedge, MD .  levETIRAcetam (KEPPRA) IVPB 500 mg/100 mL  premix, 500 mg, Intravenous, Q12H, Awilda Bill, NP, Last Rate: 400 mL/hr at 11/06/19 1147, 500 mg at 11/06/19 1147 .  levothyroxine (SYNTHROID) tablet 112 mcg, 112 mcg, Oral, Daily, Hosie Poisson, MD, 112 mcg at 11/06/19 0515 .  MEDLINE mouth rinse, 15 mL, Mouth Rinse, q12n4p, Kasa, Kurian, MD, 15 mL at 11/05/19 1635 .  piperacillin-tazobactam (ZOSYN) IVPB 3.375 g, 3.375 g,  Intravenous, Q8H, Hall, Scott A, RPH, Last Rate: 12.5 mL/hr at 11/06/19 1406, 3.375 g at 11/06/19 1406 .  sodium chloride flush (NS) 0.9 % injection 10-40 mL, 10-40 mL, Intracatheter, Q12H, Akula, Vijaya, MD .  sodium chloride flush (NS) 0.9 % injection 10-40 mL, 10-40 mL, Intracatheter, PRN, Karleen Hampshire, Vijaya, MD .  umeclidinium-vilanterol (ANORO ELLIPTA) 62.5-25 MCG/INH 1 puff, 1 puff, Inhalation, Daily, Hosie Poisson, MD, 1 puff at 11/05/19 0902   Physical exam:  Vitals:   11/05/19 1219 11/05/19 2100 11/06/19 0514 11/06/19 1213  BP: 138/64 131/64 132/61 136/64  Pulse: 96 92 82 87  Resp: 20 20 16 19   Temp: 98.5 F (36.9 C) (!) 97.5 F (36.4 C) (!) 97.4 F (36.3 C) 98.3 F (36.8 C)  TempSrc: Oral Oral Oral Oral  SpO2: 99% 97% 99% 98%  Weight:      Height:       Physical Exam Constitutional:      General: She is not in acute distress.    Appearance: She is not diaphoretic.  HENT:     Head: Normocephalic and atraumatic.     Nose: Nose normal.     Mouth/Throat:     Pharynx: No oropharyngeal exudate.  Eyes:     General: No scleral icterus.    Pupils: Pupils are equal, round, and reactive to light.  Cardiovascular:     Rate and Rhythm: Normal rate and regular rhythm.     Heart sounds: No murmur heard.   Pulmonary:     Effort: Pulmonary effort is normal. No respiratory distress.  Abdominal:     General: There is no distension.     Palpations: Abdomen is soft.     Comments: Wound covered by dressing.  Colostomy bag with stool  Musculoskeletal:     Cervical back: Normal range of motion and neck supple.    Skin:    General: Skin is warm and dry.     Findings: No erythema.  Neurological:     Mental Status: She is oriented to person, place, and time.     Cranial Nerves: No cranial nerve deficit.     Motor: No abnormal muscle tone.     Coordination: Coordination normal.     Comments: She appears to talk slower than her baseline.  Able to answer questions.  She follows command.  Psychiatric:        Mood and Affect: Affect normal.         CMP Latest Ref Rng & Units 11/06/2019  Glucose 70 - 99 mg/dL 104(H)  BUN 8 - 23 mg/dL 8  Creatinine 0.44 - 1.00 mg/dL 0.80  Sodium 135 - 145 mmol/L 138  Potassium 3.5 - 5.1 mmol/L 3.6  Chloride 98 - 111 mmol/L 105  CO2 22 - 32 mmol/L 22  Calcium 8.9 - 10.3 mg/dL 7.5(L)  Total Protein 6.5 - 8.1 g/dL -  Total Bilirubin 0.3 - 1.2 mg/dL -  Alkaline Phos 38 - 126 U/L -  Wright 15 - 41 U/L -  ALT 0 - 44 U/L -   CBC Latest Ref Rng & Units 11/06/2019  WBC 4.0 - 10.5 K/uL 10.3  Hemoglobin 12.0 - 15.0 g/dL 9.4(L)  Hematocrit 36 - 46 % 27.7(L)  Platelets 150 - 400 K/uL 135(L)    RADIOGRAPHIC STUDIES: I have personally reviewed the radiological images as listed and agreed with the findings in the report. DG Abd 1 View  Result Date: 11/04/2019 CLINICAL DATA:  Ileus. EXAM: ABDOMEN - 1 VIEW COMPARISON:  October 30, 2019. FINDINGS: The bowel gas pattern is normal. Ostomy is noted in left lower quadrant. Surgical drain is noted in the pelvis. IMPRESSION: No evidence of bowel obstruction or ileus. Electronically Signed   By: Marijo Conception M.D.   On: 11/04/2019 13:07   DG Abd 1 View  Result Date: 10/30/2019 CLINICAL DATA:  64 year old female with endotracheal and enteric tube placement. EXAM: PORTABLE CHEST AND ABDOMEN 1 VIEW COMPARISON:  CT abdomen pelvis dated 10/30/2019 and chest CT dated 08/09/2019. FINDINGS: An endotracheal tube is seen with tip approximately 3.5 cm above the carina. Enteric tube extends below the diaphragm with tip and side-port in the left  upper abdomen likely in the body of the stomach. Right-sided Port-A-Cath with tip in the region of the cavoatrial junction. There is mild central vascular prominence. Diffuse bilateral interstitial prominence as well as streaky densities in the left lower lung field, new or progressed since the prior CT. Findings may represent combination of vascular congestion/edema and/or pneumonia. A small left pleural effusion may be present. No pneumothorax. The cardiac silhouette is within normal limits. Atherosclerotic calcification of the aorta. Air is noted within the colon. No bowel dilatation. A drainage catheter noted over the pelvis. Midline anterior pelvic wall cutaneous staples. No acute osseous pathology. IMPRESSION: 1. Endotracheal tube above the carina. Enteric tube with tip and side-port in the body of the stomach. 2. Probable mild vascular congestion or edema. Pneumonia is not excluded. Clinical correlation is recommended. Electronically Signed   By: Anner Crete M.D.   On: 10/30/2019 23:47   CT HEAD WO CONTRAST  Result Date: 11/06/2019 CLINICAL DATA:  Lung cancer with brain metastasis. Somnolence today. EXAM: CT HEAD WITHOUT CONTRAST TECHNIQUE: Contiguous axial images were obtained from the base of the skull through the vertex without intravenous contrast. COMPARISON:  MRI head with contrast 09/29/2019 FINDINGS: Brain: Extensive vasogenic edema in the right frontal lobe. Enhancing mass lesion is seen in the right frontal lobe on the prior MRI. 9 mm midline shift to the left due to mass-effect is similar to the prior MRI. Negative for acute hemorrhage. Chronic ischemia in the left internal capsule unchanged. Vascular: Negative for hyperdense vessel Skull: No skull lesion identified Sinuses/Orbits: Negative Other: None IMPRESSION: Right frontal metastasis with extensive surrounding vasogenic edema. 9 mm midline shift to the left. Findings similar to the prior MRI. No new acute findings. Electronically  Signed   By: Franchot Gallo M.D.   On: 11/06/2019 11:49   CT ABDOMEN PELVIS W CONTRAST  Result Date: 10/30/2019 CLINICAL DATA:  64 year old female with diffuse abdominal and pelvic pain. History of metastatic lung cancer. EXAM: CT ABDOMEN AND PELVIS WITH CONTRAST TECHNIQUE: Multidetector CT imaging of the abdomen and pelvis was performed using the standard protocol following bolus administration of intravenous contrast. CONTRAST:  47mL OMNIPAQUE IOHEXOL 300 MG/ML  SOLN COMPARISON:  08/09/2019 FINDINGS: Lower chest: No acute abnormality. Hepatobiliary: The liver and gallbladder are unremarkable. No biliary dilatation. Pancreas: Unremarkable Spleen: Unremarkable Adrenals/Urinary Tract: The kidneys, adrenal glands and bladder are unremarkable. Stomach/Bowel: A small to moderate amount of pneumoperitoneum is present within the abdomen and pelvis, and centered adjacent to the sigmoid colon, which exhibits mild wall thickening. Colonic diverticulosis is noted. No other bowel abnormalities are identified. The appendix is unremarkable. Vascular/Lymphatic: Aortic atherosclerosis. No enlarged abdominal or pelvic lymph nodes. Reproductive: Uterus and bilateral adnexa are unremarkable. Other: A small amount free fluid in the pelvis is noted. No discrete abscess is present. Musculoskeletal: No acute or  suspicious bony abnormalities are noted. IMPRESSION: 1. Bowel perforation with small to moderate amount of pneumoperitoneum within the abdomen and pelvis. Pneumoperitoneum is centered adjacent to mildly thickened sigmoid colon, most likely representing perforation of the sigmoid colon, of uncertain etiology. Small amount of free fluid in the pelvis. No discrete abscess. 2. Aortic Atherosclerosis (ICD10-I70.0). Critical Value/emergent results were called by telephone at the time of interpretation on 10/30/2019 at 4:40 pm to provider Laban Emperor, who verbally acknowledged these results. Electronically Signed   By: Margarette Canada  M.D.   On: 10/30/2019 16:42   DG Chest Port 1 View  Result Date: 10/30/2019 CLINICAL DATA:  64 year old female with endotracheal and enteric tube placement. EXAM: PORTABLE CHEST AND ABDOMEN 1 VIEW COMPARISON:  CT abdomen pelvis dated 10/30/2019 and chest CT dated 08/09/2019. FINDINGS: An endotracheal tube is seen with tip approximately 3.5 cm above the carina. Enteric tube extends below the diaphragm with tip and side-port in the left upper abdomen likely in the body of the stomach. Right-sided Port-A-Cath with tip in the region of the cavoatrial junction. There is mild central vascular prominence. Diffuse bilateral interstitial prominence as well as streaky densities in the left lower lung field, new or progressed since the prior CT. Findings may represent combination of vascular congestion/edema and/or pneumonia. A small left pleural effusion may be present. No pneumothorax. The cardiac silhouette is within normal limits. Atherosclerotic calcification of the aorta. Air is noted within the colon. No bowel dilatation. A drainage catheter noted over the pelvis. Midline anterior pelvic wall cutaneous staples. No acute osseous pathology. IMPRESSION: 1. Endotracheal tube above the carina. Enteric tube with tip and side-port in the body of the stomach. 2. Probable mild vascular congestion or edema. Pneumonia is not excluded. Clinical correlation is recommended. Electronically Signed   By: Anner Crete M.D.   On: 10/30/2019 23:47    Assessment and plan- Patient is a 64 y.o. 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  #Decreased mental status, 11/06/2019, CT head without contrast showed extensive vasogenic edema in the right frontal lobe, ALC mass lesion is seen in  the right frontal lobe.  9 mm midline shift into the left due to mass-effect similar to prior MRI. Patient has had history of brain radiation, radiation necrosis, she was previously on a tapering course of dexamethasone continue current hospitalization. Obtain MRI brain for further evaluation. Agree with dexamethasone, she is on 10 mg every 12 hours. Continue GI prophylaxis with Pepcid. Continue Keppra for seizure prophylaxis Glycemic control Aspiration precaution. Neurochecks  #Perforated diverticulitis status post sigmoid resection with mid line wound infection.  Continue IV Zosyn. #Stage IV lung cancer with brain metastasis status post radiation Patient has been on osimertinib and her extra cranial cancer has been stable.  We will see how she does in the next 1 to 2 days.  If mental status improves, will resume osimertinib  #Goals of care discussion, patient feels weak and fatigued.  She wants to go home.  Her prognosis is poor given her brain metastasis and recent surgeries.  Patient desires future cancer treatments if possible.  I recommend consulting palliative care service for further discussion about CODE STATUS, with patient and family members. Continue current scope of care.  She is full code.  #Anemia, normal folate and vitamin B12 level.  Ferritin level  was high likely due to acute inflammation.  Will check reticulocyte panel.  Continue DVT prophylaxis. Discussed with Dr. Karleen Hampshire.  Thank you for allowing me to participate in the care of this patient.  Total face to face encounter time for this patient visit was 70 min. >50% of the time was  spent in counseling and coordination of care.    Earlie Server, MD, PhD Hematology Oncology [facility] Pager- [phone] 11/06/2019

## 2019-11-06 NOTE — Significant Event (Signed)
Rapid Response Event Note  Overview: Time Called: 6761 Arrival Time: 1520 Event Type: Neurologic, MEWS, Cardiac, Other (Comment)  Initial Focused Assessment: called RR for post op bowel perf pt with mews 5, tachycardic, confused, jaundiced...    Interventions: Moved to [facility] for further evaluation and care. Dr Karleen Hampshire to speak with family.  Plan of Care (if not transferred):  Event Summary: Name of Physician Notified: Akula at 1525    at    Outcome: Transferred (Comment) (to stepdown)  Event End Time: [place]

## 2019-11-06 NOTE — Progress Notes (Addendum)
PROGRESS NOTE    TONILYNN BIEKER  EGB:[id number] DOB: 14-Feb-1956 DOA: 10/30/2019 PCP: Juline Patch, MD   Chief Complaint  Patient presents with  . Diarrhea    Brief Narrative:   64 year old with history of hypothyroidism, stage IV lung cancer with brain mets hypertension, hyperlipidemia, depression, cervical cancer presents to [facility] on 10/30/2019 by EMS from home for diarrhea and right lower quadrant pain for 7 to 9 days.  She underwent a CT abdomen and pelvis revealing perforated bowel with small to moderate amount of pneumoperitoneum within the abdomen and pelvis.  General surgery consulted and she underwent emergent exploratory laparotomy with lysis of additions, abdominal washout and sigmoid colon resection with colostomy by Dr. Lysle Pearl on 7/11.  Intraoperatively she was intubated and transferred to ICU.  She was extubated on 10/31/19 and transferred to [facility] service. NG tube was removed post op day 1 And was started on clear liquid diet.  She is able to tolerate clear liquid diet without any nausea or vomiting, diet advanced to full liquid diet.  And examined this morning she appears somnolent and as per RN she has not received any oxycodone or morphine overnight.  Discussed with her daughter who saw her yesterday who reports that she has been more sleepy when compared to at home. Ordered stat CT head without contrast for further evaluation of somnolence and an ABG.  Discontinued oxycodone and morphine for now.    Assessment & Plan:   Active Problems:   Perforated bowel ([place])   Pressure injury of skin   Perforated diverticulitis s/p emergent exploratory laparotomy with lysis of adhesions, sigmoid colon resection with colostomy by Dr. Lysle Pearl and mid line wound infection.  NG TUBE removed on 11/02/19 and clear liquid diet started.  She was started on clear liquid diet advance to full liquid diet.  She is able to tolerate without nausea vomiting or abdominal pain.  She has stool in the ostomy  bag.  Continue with IV Zosyn for wound infection. Patient remains afebrile and WBC count within normal limits.  Surgery plans to do diet if her mental status improves.  Acute encephalopathy Unclear etiology, suspect worsening of her brain mets from the lung cancer.  Will get CT head without contrast and follow up with an MRI .  ABG to follow.  Pt though somnolent, able to answer all questions appropriately.     Essential hypertension Well-controlled blood pressure parameters   Stage IV lung cancer and mets Outpatient follow-up with oncology Continue with Keppra for seizure prophylaxis.    Anxiety and depression -Started the patient on her home meds BuSpar and Prozac. Continue to hold Xanax as she appears to be sleepy.   Acute anemia of blood loss Baseline hemoglobin around 12.  Hemoglobin dropped from 11.2 to 9.3 to 9.1 to 8.6 to 9/8 to 9.7 to 9.4. Transfuse to keep hemoglobin in the 7. Anemia panel revealed low iron levels at 24, elevated ferritin, folate of 11.2 and vitamin B12 664.  Iron supplementation will be added once she is able to take soft diet.   Twitching around the mouth  Resolved spontaneously Neurology consulted to evaluate for seizure, suggested patient probably has some facial spasms.  No seizure activity and no indication for further work-up  Pressure injury Pressure Injury 11/01/19 Sacrum Mid Stage 2 -  Partial thickness loss of dermis presenting as a shallow open injury with a red, pink wound bed without slough. pink (Active)  11/01/19 1856  Location: Sacrum  Location Orientation:  Mid  Staging: Stage 2 -  Partial thickness loss of dermis presenting as a shallow open injury with a red, pink wound bed without slough.  Wound Description (Comments): pink  Present on Admission:    Wound care consulted and recommendations given    Mild Thrombocytopenia Platelet count at 135000 this am.  Monitor.   Hypokalemia Replaced   Hypophosphatemia Replaced  and repeat level within normal limits   Diabetes mellitus.  Well-controlled CBGs CBG (last 3)  Recent Labs    11/06/19 0010 11/06/19 0408 11/06/19 0736  GLUCAP 130* 104* 97   Hemoglobin A1c at 6.5. Continue with sliding scale insulin.    DVT prophylaxis: Lovenox Code Status: Full code Family Communication: None at bedside, discussed with daughter over the phone today.  disposition:   Status is: Inpatient  Remains inpatient appropriate because:IV treatments appropriate due to intensity of illness or inability to take PO   Dispo: The patient is from: Home              Anticipated d/c is to: Home, pt refusing SNF.               Anticipated d/c date is: 2 days              Patient currently is not medically stable to d/c.       Consultants:   General surgery  Neurology  Palliative care      Procedures: S/P Hartman's Procedure for perforated Diverticulitis.  Antimicrobials:  Anti-infectives (From admission, onward)   Start     Dose/Rate Route Frequency Ordered Stop   10/31/19 0200  piperacillin-tazobactam (ZOSYN) IVPB 3.375 g     Discontinue     3.375 g 12.5 mL/hr over 240 Minutes Intravenous Every 8 hours 10/30/19 2343     10/30/19 1700  piperacillin-tazobactam (ZOSYN) IVPB 3.375 g        3.375 g 100 mL/hr over 30 Minutes Intravenous  Once 10/30/19 1645 10/30/19 1817       Subjective: Somnolent this am, barely opening eyes. Able to answer all questions . Does ot want to go to SNF.   Objective: Vitals:   11/05/19 0417 11/05/19 1219 11/05/19 2100 11/06/19 0514  BP: 123/66 138/64 131/64 132/61  Pulse: 91 96 92 82  Resp: 20 20 20 16   Temp: 98.5 F (36.9 C) 98.5 F (36.9 C) (!) 97.5 F (36.4 C) (!) 97.4 F (36.3 C)  TempSrc:  Oral Oral Oral  SpO2: 100% 99% 97% 99%  Weight:      Height:        Intake/Output Summary (Last 24 hours) at 11/06/2019 1058 Last data filed at 11/06/2019 0511 Gross per 24 hour  Intake 792.65 ml  Output 1015 ml  Net  -222.35 ml   Filed Weights   10/30/19 1452 10/30/19 2248  Weight: 103 kg 100.6 kg    Examination:  General exam: somnolent, not in distress, Respiratory system: diminished air entry at bases, no wheezing heard. No tachypnea.  Cardiovascular system: S1S2, RRR, no JVD,  Gastrointestinal system: Abdomen is soft, mildly tender at the midline incision site abdominal wound with some seropurulent drainage., ostomy with brown stool in the  Central nervous system: Somnolent but able to answer questions, left lower extremity weakness  more compared to the right.  Extremities: Bilateral leg edema present with stasis ulcers on bilateral feet bandaged  skin: Stage II sacral decubitus ulcer Psychiatry: Flat affect     Data Reviewed: I have personally reviewed following labs  and imaging studies  CBC: Recent Labs  Lab 11/01/19 0747 11/02/19 0433 11/03/19 0644 11/04/19 0442 11/06/19 0735  WBC 18.3* 14.5* 11.4* 9.9 10.3  HGB 9.1* 8.6* 9.8* 9.7* 9.4*  HCT 26.6* 26.5* 29.0* 28.6* 27.7*  MCV 90.5 93.0 91.2 90.8 90.5  PLT 138* 148* 145* 129* 135*    Basic Metabolic Panel: Recent Labs  Lab 11/02/19 0433 11/03/19 0644 11/04/19 0442 11/05/19 0437 11/06/19 0735  NA 136 134* 136 138 138  K 3.4* 3.8 3.1* 3.6 3.6  CL 106 102 104 104 105  CO2 21* 20* 23 25 22   GLUCOSE 75 113* 92 109* 104*  BUN 23 14 10 10 8   CREATININE 0.78 0.85 0.73 0.75 0.80  CALCIUM 7.5* 7.8* 7.3* 8.1* 7.5*  MG 2.1 2.1 1.8 2.0 1.8  PHOS 2.2* 2.0* 2.6 2.1* 2.8    GFR: Estimated Creatinine Clearance: 93.3 mL/min (by C-G formula based on SCr of 0.8 mg/dL).  Liver Function Tests: Recent Labs  Lab 10/30/19 1508  AST 26  ALT 43  ALKPHOS 100  BILITOT 0.9  PROT 6.7  ALBUMIN 2.2*    CBG: Recent Labs  Lab 11/05/19 1601 11/05/19 2012 11/06/19 0010 11/06/19 0408 11/06/19 0736  GLUCAP 124* 109* 130* 104* 97     Recent Results (from the past 240 hour(s))  SARS Coronavirus 2 by RT PCR (hospital order,  performed in [facility] hospital lab) Nasopharyngeal Nasopharyngeal Swab     Status: None   Collection Time: 10/30/19  5:04 PM   Specimen: Nasopharyngeal Swab  Result Value Ref Range Status   SARS Coronavirus 2 NEGATIVE NEGATIVE Final    Comment: (NOTE) SARS-CoV-2 target nucleic acids are NOT DETECTED.  The SARS-CoV-2 RNA is generally detectable in upper and lower respiratory specimens during the acute phase of infection. The lowest concentration of SARS-CoV-2 viral copies this assay can detect is 250 copies / mL. A negative result does not preclude SARS-CoV-2 infection and should not be used as the sole basis for treatment or other patient management decisions.  A negative result may occur with improper specimen collection / handling, submission of specimen other than nasopharyngeal swab, presence of viral mutation(s) within the areas targeted by this assay, and inadequate number of viral copies (<250 copies / mL). A negative result must be combined with clinical observations, patient history, and epidemiological information.  Fact Sheet for Patients:   StrictlyIdeas.no  Fact Sheet for Healthcare Providers: BankingDealers.co.za  This test is not yet approved or  cleared by the [place] FDA and has been authorized for detection and/or diagnosis of SARS-CoV-2 by FDA under an Emergency Use Authorization (EUA).  This EUA will remain in effect (meaning this test can be used) for the duration of the COVID-19 declaration under Section 564(b)(1) of the Act, 21 U.S.C. section 360bbb-3(b)(1), unless the authorization is terminated or revoked sooner.  Performed at [facility], [address] [place], [place] [address]   Blood culture (routine x 2)     Status: None   Collection Time: 10/30/19  5:26 PM   Specimen: BLOOD  Result Value Ref Range Status   Specimen Description BLOOD BLOOD LEFT HAND  Final   Special Requests    Final    BOTTLES DRAWN AEROBIC AND ANAEROBIC Blood Culture results may not be optimal due to an inadequate volume of blood received in culture bottles   Culture   Final    NO GROWTH 5 DAYS Performed at [facility], [address] [place],  [place] [address]    Report Status 11/04/2019 FINAL  Final  Blood culture (routine x 2)     Status: None   Collection Time: 10/30/19  5:36 PM   Specimen: BLOOD  Result Value Ref Range Status   Specimen Description BLOOD BLOOD RIGHT WRIST  Final   Special Requests   Final    AEROBIC BOTTLE ONLY Blood Culture results may not be optimal due to an inadequate volume of blood received in culture bottles   Culture   Final    NO GROWTH 5 DAYS Performed at [facility], [address], [place], [place] [address]    Report Status 11/04/2019 FINAL  Final  Urine culture     Status: None   Collection Time: 10/30/19 11:18 PM   Specimen: Urine, Random  Result Value Ref Range Status   Specimen Description   Final    URINE, RANDOM Performed at [facility], [address] [place], [place] [address]    Special Requests   Final    NONE Performed at [facility], [address], [place], [place] [address]    Culture   Final    NO GROWTH Performed at [facility], [place] [address] [place], [place] [address]    Report Status 11/01/2019 FINAL  Final  MRSA PCR Screening     Status: None   Collection Time: 10/31/19  1:42 AM   Specimen: Nasal Mucosa; Nasopharyngeal  Result Value Ref Range Status   MRSA by PCR NEGATIVE NEGATIVE Final    Comment:        The GeneXpert MRSA Assay (FDA approved for NASAL specimens only), is one component of a comprehensive MRSA colonization surveillance program. It is not intended to diagnose MRSA infection nor to guide or monitor treatment for MRSA infections. Performed at [facility], [address], [place], [place] [address]          Radiology Studies: No  results found.      Scheduled Meds: . sodium chloride   Intravenous Once  . aspirin EC  81 mg Oral Daily  . atorvastatin  10 mg Oral Daily  . busPIRone  5 mg Oral Daily  . chlorhexidine  15 mL Mouth Rinse BID  . Chlorhexidine Gluconate Cloth  6 each Topical Daily  . clopidogrel  75 mg Oral Daily  . collagenase   Topical Daily  . enoxaparin (LOVENOX) injection  40 mg Subcutaneous Q24H  . feeding supplement  1 Container Oral TID BM  . FLUoxetine  10 mg Oral Daily  . furosemide  20 mg Oral BID  . insulin aspart  0-9 Units Subcutaneous Q4H  . levothyroxine  112 mcg Oral Daily  . mouth rinse  15 mL Mouth Rinse q12n4p  . sodium chloride flush  10-40 mL Intracatheter Q12H  . umeclidinium-vilanterol  1 puff Inhalation Daily   Continuous Infusions: . sodium chloride 250 mL (11/02/19 1041)  . famotidine (PEPCID) IV Stopped (11/05/19 2204)  . levETIRAcetam 500 mg (11/06/19 0925)  . piperacillin-tazobactam (ZOSYN)  IV 12.5 mL/hr at 11/06/19 0403     LOS: 7 days       Hosie Poisson, MD [facility]   To contact the attending provider between 7A-7P or the covering provider during after hours 7P-7A, please log into the web site [email or web address] and access using universal [place] password for that web site. If you do not have the password, please call the hospital operator.  11/06/2019, 10:58 AM  Addendum:  Pt had RR , where she became tachycardic, tachypnic, increasing somnolence .  Pt appears to have impaired left sided vision.  She was transferred to ICU.  After further discussing with daughter and sister , family decided to change the code status to DNR ,as they reported that patient never wanted to be on life support and wanted to be on comfort measures.  Relayed the message to her oncologist and transitioned to comfort measures.  We will continue with IV keppra and IV decadron for now.    Hosie Poisson MD

## 2019-11-06 NOTE — Progress Notes (Signed)
After family discussion with Dr Karleen Hampshire, family decided on comfort measures with palliative to follow up Monday.

## 2019-11-07 ENCOUNTER — Encounter: Payer: Self-pay | Admitting: Surgery

## 2019-11-07 ENCOUNTER — Inpatient Hospital Stay: Payer: 59

## 2019-11-07 ENCOUNTER — Inpatient Hospital Stay: Payer: 59 | Admitting: Hospice and Palliative Medicine

## 2019-11-07 DIAGNOSIS — Z4659 Encounter for fitting and adjustment of other gastrointestinal appliance and device: Secondary | ICD-10-CM

## 2019-11-07 DIAGNOSIS — Z515 Encounter for palliative care: Secondary | ICD-10-CM

## 2019-11-07 DIAGNOSIS — Z66 Do not resuscitate: Secondary | ICD-10-CM

## 2019-11-07 DIAGNOSIS — K631 Perforation of intestine (nontraumatic): Secondary | ICD-10-CM | POA: Diagnosis not present

## 2019-11-07 MED ORDER — NYSTATIN 100000 UNIT/GM EX CREA
TOPICAL_CREAM | Freq: Two times a day (BID) | CUTANEOUS | Status: DC
  Administered 2019-11-18: 1 via TOPICAL
  Filled 2019-11-07 (×3): qty 15

## 2019-11-07 MED ORDER — ALPRAZOLAM 0.25 MG PO TABS
0.2500 mg | ORAL_TABLET | Freq: Every evening | ORAL | Status: DC | PRN
  Administered 2019-11-08 – 2019-11-11 (×3): 0.25 mg via ORAL
  Filled 2019-11-07 (×4): qty 1

## 2019-11-07 MED ORDER — LEVETIRACETAM 500 MG PO TABS
500.0000 mg | ORAL_TABLET | Freq: Two times a day (BID) | ORAL | Status: DC
  Administered 2019-11-07 – 2019-11-21 (×28): 500 mg via ORAL
  Filled 2019-11-07 (×30): qty 1

## 2019-11-07 MED ORDER — MORPHINE SULFATE (PF) 2 MG/ML IV SOLN
2.0000 mg | Freq: Once | INTRAVENOUS | Status: AC
  Administered 2019-11-07: 2 mg via INTRAVENOUS
  Filled 2019-11-07: qty 1

## 2019-11-07 MED ORDER — POLYVINYL ALCOHOL 1.4 % OP SOLN
1.0000 [drp] | OPHTHALMIC | Status: DC | PRN
  Administered 2019-11-07 – 2019-11-18 (×13): 1 [drp] via OPHTHALMIC
  Filled 2019-11-07 (×2): qty 15

## 2019-11-07 NOTE — Progress Notes (Signed)
[facility] hospital Liaison note:  New referral for [facility] hospice services at home received from TOC Sarah Boswell. Patient information given to referral. Hospice eligibility pending.  Writer met in the room with patient, her sister Tanya,  son [name] and brother in law to initiate education regarding hospice services, philosophy and team approach to care with understanding voiced.   Patient states she does not have any DME needs at this time. Plan is for continued treatment with IV antibiotics and discharge home when medically optimized. She is also receiving IV steroids, pain medication and anti-seizure medication. These would need to be transitioned to oral prior to discharge home, discussed with Palliative NP Amber Smith. Hospice contact number given to patient and her sister Tanya.  Will continue to follow through discharge. Thank you for the opportunity to be involved in the care of this patient and her family. Karen Robertson BSN,RN, [facility] Hospital Liaison [facility] [phone] 

## 2019-11-07 NOTE — Progress Notes (Signed)
PROGRESS NOTE    Kristina Wright  ZOX:[id number] DOB: 1955/12/24 DOA: 10/30/2019 PCP: Juline Patch, MD   Chief Complaint  Patient presents with  . Diarrhea    Brief Narrative:   64 year old with history of hypothyroidism, stage IV lung cancer with brain mets hypertension, hyperlipidemia, depression, cervical cancer presents to [facility] on 10/30/2019 by EMS from home for diarrhea and right lower quadrant pain for 7 to 9 days.  She underwent a CT abdomen and pelvis revealing perforated bowel with small to moderate amount of pneumoperitoneum within the abdomen and pelvis.  General surgery consulted and she underwent emergent exploratory laparotomy with lysis of additions, abdominal washout and sigmoid colon resection with colostomy by Dr. Lysle Pearl on 7/11.  Intraoperatively she was intubated and transferred to ICU.  She was extubated on 10/31/19 and transferred to [facility] service. NG tube was removed post op day 1 And was started on clear liquid diet.  She is able to tolerate clear liquid diet without any nausea or vomiting, diet advanced to full liquid diet.  And examined this morning she appears somnolent and as per RN she has not received any oxycodone or morphine overnight.  Discussed with her daughter who saw her yesterday who reports that she has been more sleepy when compared to at home. Ordered stat CT head without contrast for further evaluation of somnolence and altered mental status. CT head showed  Right frontal metastasis with extensive surrounding vasogenic edema. 9 mm midline shift to the left. She was started on IV decadron 10 mg BID and transferred to ICU.  Bedside meeting with the daughter and sister, they changed the code status to DNR and wanted comfort measures. But this am after palliative care meeting patient, wanted to be treated for the abdominal infection and wanted to go home with hospice services when medically stable.     Assessment & Plan:   Active Problems:   Lung cancer  metastatic to brain [facility])   Bowel perforation (HCC)   Pressure injury of skin   Vasogenic brain edema (HCC)   Normocytic anemia   Palliative care by specialist   Advanced care planning/counseling discussion   DNR (do not resuscitate)   Perforated diverticulitis s/p emergent exploratory laparotomy with lysis of adhesions, sigmoid colon resection with colostomy by Dr. Lysle Pearl and mid line wound infection.  NG TUBE removed on 11/02/19 and clear liquid diet started.  She was started on clear liquid diet advance to full liquid diet.  She is able to tolerate without nausea vomiting or abdominal pain.  She has stool in the ostomy bag.  Continue with IV Zosyn for wound infection. Patient remains afebrile and WBC count within normal limits.  Currently on regular diet.    Acute encephalopathy Probably fromt he vaso genic edema from the brain mets.  On IV decadron. She ws transferred to ICU as for AMS and tachycardia. This am she improved and is being transferred to med surg bed.  She is not on comfort measures any more, would like her abd wound to be treated before transitioning to home hospice .     Essential hypertension Well controlled.    Stage IV lung cancer and mets Outpatient follow-up with oncology Continue with Keppra for seizure prophylaxis.    Anxiety and depression -Started the patient on her home meds BuSpar and Prozac. Xanax added to her meds.    Acute anemia of blood loss Baseline hemoglobin around 12.  Hemoglobin dropped from 11.2 to 9.3 to 9.1 to  8.6 to 9/8 to 9.7 to 9.4. Transfuse to keep hemoglobin in the 7. Anemia panel revealed low iron levels at 24, elevated ferritin, folate of 11.2 and vitamin B12 664.  Iron supplementation will be added once she is able to take soft diet.   Twitching around the mouth  Resolved spontaneously Neurology consulted to evaluate for seizure, suggested patient probably has some facial spasms.  No seizure activity and no indication for  further work-up  Pressure injury Pressure Injury 11/01/19 Sacrum Mid Stage 2 -  Partial thickness loss of dermis presenting as a shallow open injury with a red, pink wound bed without slough. pink (Active)  11/01/19 1856  Location: Sacrum  Location Orientation: Mid  Staging: Stage 2 -  Partial thickness loss of dermis presenting as a shallow open injury with a red, pink wound bed without slough.  Wound Description (Comments): pink  Present on Admission:    Wound care consulted and recommendations given    Mild Thrombocytopenia Continue to monitor.   Hypokalemia Replaced   Hypophosphatemia Replaced.    Diabetes mellitus.  Well-controlled CBGs CBG (last 3)  Recent Labs    11/06/19 1212 11/06/19 1520 11/06/19 1536  GLUCAP 109* 193* 186*   Hemoglobin A1c at 6.5.     DVT prophylaxis: Lovenox Code Status: Full code Family Communication: None at bedside, discussed with daughter at bedside, yesterday.  disposition:   Status is: Inpatient  Remains inpatient appropriate because:IV treatments appropriate due to intensity of illness or inability to take PO   Dispo: The patient is from: Home              Anticipated d/c is to: Home, pt refusing SNF.               Anticipated d/c date is: 2 days              Patient currently is not medically stable to d/c.       Consultants:   General surgery  Neurology  Palliative care      Procedures: S/P Hartman's Procedure for perforated Diverticulitis.  Antimicrobials:  Anti-infectives (From admission, onward)   Start     Dose/Rate Route Frequency Ordered Stop   10/31/19 0200  piperacillin-tazobactam (ZOSYN) IVPB 3.375 g     Discontinue     3.375 g 12.5 mL/hr over 240 Minutes Intravenous Every 8 hours 10/30/19 2343     10/30/19 1700  piperacillin-tazobactam (ZOSYN) IVPB 3.375 g        3.375 g 100 mL/hr over 30 Minutes Intravenous  Once 10/30/19 1645 10/30/19 1817       Subjective: Confused, but better than  yesterday.  Wants to go home when ready.   Objective: Vitals:   11/07/19 0000 11/07/19 0700 11/07/19 0800 11/07/19 0900  BP: 129/66     Pulse: 79     Resp: 18 (!) 23 18 20   Temp:      TempSrc:      SpO2: 91%     Weight:      Height:        Intake/Output Summary (Last 24 hours) at 11/07/2019 1538 Last data filed at 11/07/2019 1357 Gross per 24 hour  Intake 1532.51 ml  Output 360 ml  Net 1172.51 ml   Filed Weights   10/30/19 1452 10/30/19 2248 11/06/19 1538  Weight: 103 kg 100.6 kg 101.8 kg    Examination:  General exam: Alert and able to answer simple questions not in any kind of distress Respiratory system:  Diminished air entry at bases, no wheezing or rhonchi Cardiovascular system: S1-S2 heard, tachycardic, no JVD Gastrointestinal system: Abdomen is soft, mildly tender midline incision intact, stool in the ostomy bag Central nervous system: Patient is more alert than yesterday, still confused she thinks that she is in the psychiatry ward.  Able to answer some simple questions Extremities: Bilateral leg edema with stasis ulcers bandaged  skin: Stage II sacral decubitus ulcer Psychiatry: Cannot be assessed     Data Reviewed: I have personally reviewed following labs and imaging studies  CBC: Recent Labs  Lab 11/01/19 0747 11/02/19 0433 11/03/19 0644 11/04/19 0442 11/06/19 0735  WBC 18.3* 14.5* 11.4* 9.9 10.3  HGB 9.1* 8.6* 9.8* 9.7* 9.4*  HCT 26.6* 26.5* 29.0* 28.6* 27.7*  MCV 90.5 93.0 91.2 90.8 90.5  PLT 138* 148* 145* 129* 135*    Basic Metabolic Panel: Recent Labs  Lab 11/02/19 0433 11/03/19 0644 11/04/19 0442 11/05/19 0437 11/06/19 0735  NA 136 134* 136 138 138  K 3.4* 3.8 3.1* 3.6 3.6  CL 106 102 104 104 105  CO2 21* 20* 23 25 22   GLUCOSE 75 113* 92 109* 104*  BUN 23 14 10 10 8   CREATININE 0.78 0.85 0.73 0.75 0.80  CALCIUM 7.5* 7.8* 7.3* 8.1* 7.5*  MG 2.1 2.1 1.8 2.0 1.8  PHOS 2.2* 2.0* 2.6 2.1* 2.8    GFR: Estimated Creatinine  Clearance: 93.7 mL/min (by C-G formula based on SCr of 0.8 mg/dL).  Liver Function Tests: No results for input(s): AST, ALT, ALKPHOS, BILITOT, PROT, ALBUMIN in the last 168 hours.  CBG: Recent Labs  Lab 11/06/19 0408 11/06/19 0736 11/06/19 1212 11/06/19 1520 11/06/19 1536  GLUCAP 104* 97 109* 193* 186*     Recent Results (from the past 240 hour(s))  SARS Coronavirus 2 by RT PCR (hospital order, performed in [facility] hospital lab) Nasopharyngeal Nasopharyngeal Swab     Status: None   Collection Time: 10/30/19  5:04 PM   Specimen: Nasopharyngeal Swab  Result Value Ref Range Status   SARS Coronavirus 2 NEGATIVE NEGATIVE Final    Comment: (NOTE) SARS-CoV-2 target nucleic acids are NOT DETECTED.  The SARS-CoV-2 RNA is generally detectable in upper and lower respiratory specimens during the acute phase of infection. The lowest concentration of SARS-CoV-2 viral copies this assay can detect is 250 copies / mL. A negative result does not preclude SARS-CoV-2 infection and should not be used as the sole basis for treatment or other patient management decisions.  A negative result may occur with improper specimen collection / handling, submission of specimen other than nasopharyngeal swab, presence of viral mutation(s) within the areas targeted by this assay, and inadequate number of viral copies (<250 copies / mL). A negative result must be combined with clinical observations, patient history, and epidemiological information.  Fact Sheet for Patients:   StrictlyIdeas.no  Fact Sheet for Healthcare Providers: BankingDealers.co.za  This test is not yet approved or  cleared by the [place] FDA and has been authorized for detection and/or diagnosis of SARS-CoV-2 by FDA under an Emergency Use Authorization (EUA).  This EUA will remain in effect (meaning this test can be used) for the duration of the COVID-19 declaration under  Section 564(b)(1) of the Act, 21 U.S.C. section 360bbb-3(b)(1), unless the authorization is terminated or revoked sooner.  Performed at [facility], [address] [place], [place] [address]   Blood culture (routine x 2)     Status: None   Collection Time: 10/30/19  5:26 PM   Specimen: BLOOD  Result Value Ref Range Status   Specimen Description BLOOD BLOOD LEFT HAND  Final   Special Requests   Final    BOTTLES DRAWN AEROBIC AND ANAEROBIC Blood Culture results may not be optimal due to an inadequate volume of blood received in culture bottles   Culture   Final    NO GROWTH 5 DAYS Performed at [facility], [address] [place], [place] [address]    Report Status 11/04/2019 FINAL  Final  Blood culture (routine x 2)     Status: None   Collection Time: 10/30/19  5:36 PM   Specimen: BLOOD  Result Value Ref Range Status   Specimen Description BLOOD BLOOD RIGHT WRIST  Final   Special Requests   Final    AEROBIC BOTTLE ONLY Blood Culture results may not be optimal due to an inadequate volume of blood received in culture bottles   Culture   Final    NO GROWTH 5 DAYS Performed at [facility], [address], [place], [place] [address]    Report Status 11/04/2019 FINAL  Final  Urine culture     Status: None   Collection Time: 10/30/19 11:18 PM   Specimen: Urine, Random  Result Value Ref Range Status   Specimen Description   Final    URINE, RANDOM Performed at [facility], [address], [place], [place] [address]    Special Requests   Final    NONE Performed at [facility], [address] [place], [place] [address]    Culture   Final    NO GROWTH Performed at [facility], [place] [address] [place], [place] [address]    Report Status 11/01/2019 FINAL  Final  MRSA PCR Screening     Status: None   Collection Time: 10/31/19  1:42 AM   Specimen: Nasal Mucosa; Nasopharyngeal  Result Value Ref Range Status   MRSA by  PCR NEGATIVE NEGATIVE Final    Comment:        The GeneXpert MRSA Assay (FDA approved for NASAL specimens only), is one component of a comprehensive MRSA colonization surveillance program. It is not intended to diagnose MRSA infection nor to guide or monitor treatment for MRSA infections. Performed at [facility], [address] [place], [place] [address]          Radiology Studies: CT HEAD WO CONTRAST  Result Date: 11/06/2019 CLINICAL DATA:  Lung cancer with brain metastasis. Somnolence today. EXAM: CT HEAD WITHOUT CONTRAST TECHNIQUE: Contiguous axial images were obtained from the base of the skull through the vertex without intravenous contrast. COMPARISON:  MRI head with contrast 09/29/2019 FINDINGS: Brain: Extensive vasogenic edema in the right frontal lobe. Enhancing mass lesion is seen in the right frontal lobe on the prior MRI. 9 mm midline shift to the left due to mass-effect is similar to the prior MRI. Negative for acute hemorrhage. Chronic ischemia in the left internal capsule unchanged. Vascular: Negative for hyperdense vessel Skull: No skull lesion identified Sinuses/Orbits: Negative Other: None IMPRESSION: Right frontal metastasis with extensive surrounding vasogenic edema. 9 mm midline shift to the left. Findings similar to the prior MRI. No new acute findings. Electronically Signed   By: Franchot Gallo M.D.   On: 11/06/2019 11:49        Scheduled Meds: . sodium chloride   Intravenous Once  . busPIRone  5 mg Oral Daily  . Chlorhexidine Gluconate Cloth  6 each Topical  Daily  . clopidogrel  75 mg Oral Daily  . collagenase   Topical Daily  . dexamethasone (DECADRON) injection  10 mg Intravenous Q12H  . enoxaparin (LOVENOX) injection  40 mg Subcutaneous Q24H  . FLUoxetine  10 mg Oral Daily  . levETIRAcetam  500 mg Oral BID  . levothyroxine  112 mcg Oral Daily  . nystatin cream   Topical BID  . sodium chloride flush  10-40 mL Intracatheter Q12H  .  umeclidinium-vilanterol  1 puff Inhalation Daily   Continuous Infusions: . sodium chloride 5 mL/hr at 11/07/19 1108  . piperacillin-tazobactam (ZOSYN)  IV 12.5 mL/hr at 11/07/19 1108     LOS: 8 days       Hosie Poisson, MD [facility]   To contact the attending provider between 7A-7P or the covering provider during after hours 7P-7A, please log into the web site [email or web address] and access using universal [place] password for that web site. If you do not have the password, please call the hospital operator.  11/07/2019, 3:38 PM

## 2019-11-07 NOTE — Progress Notes (Signed)
Pt is resting in bed with eyes closed at this time. Full comfort measures. Pt seems to be comfortable and does not c/o any pain right now. No s/s of distress.

## 2019-11-07 NOTE — Consult Note (Addendum)
Consultation Note Date: 11/07/2019   Patient Name: Kristina Wright  DOB: Oct 29, 1955  MRN: [id number]  Age / Sex: 64 y.o., female  PCP: Juline Patch, MD Referring Physician: Hosie Poisson, MD  Reason for Consultation: Establishing goals of care  HPI/Patient Profile: 64 y.o. female  with past medical history of hypertension, cervical cancer 1989, stage IV lung cancer with brain metastasis, and thyroid disease admitted on 10/30/2019 with bowl perforation.   10/31/19 - exploratory laparotomy, lysis of adhesions, abdominal washout, sigmoid colon resection with colostomy performed.  Patient faces treatment option decisions, advanced directive decisions, and anticipatory care needs.  Clinical Assessment and Goals of Care: I have reviewed medical records including EPIC notes, labs and imaging. Received report from primary RN. RN had no acute concerns.   Went to visit patient at bedside - no family present. Patient was awake, alert, oriented, and able to participate in conversation. Met with patient to discuss diagnosis, prognosis, GOC, EOL wishes, disposition, and options.  I introduced Palliative Medicine as specialized medical care for people living with serious illness. It focuses on providing relief from the symptoms and stress of a serious illness. The goal is to improve quality of life for both the patient and the family.  We discussed a brief life review of the patient. Ms. Chismar describes herself as someone who loves to cross stitch and work on puzzles. She is divorced with three children - one daughter and two sons. One son lives with her currently, but she states he does not want to help in her care at home, such as empty her [facility] when needed. Ms. Crill states that she is not able to rely on her children or sister very much for support. She does have a neighbor who is a Marine scientist who has expressed to Ms. Easton  she can assist with some of her needs PRN. Ms. 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  As far as functional and nutritional status, Ms. Hutt was able to get around on her own at home short distances, but needed help with tasks such as emptying her BSC. She was able to eat well at home.   We discussed patient's current illness and what it means in the larger context of patient's on-going co-morbidities. Natural disease trajectory and expectations at EOL were discussed. I attempted to elicit values and goals of care important to the patient. Ms. Caridi has a clear understanding of her current medical situation. She understands that her cancer is progressing and not curable and said that she is "ready if it's my time." The difference between aggressive medical intervention and comfort care was considered in light of the patient's goals of care. Hospice and Palliative Care services outpatient were explained and offered. Hospice philosophy was discussed. She states that she would like to continue her current medical care for the bowel perforation but is interested in home hospice support on discharge for her cancer. At this time,  Ms. Taborn is not FULL comfort care. Currently, she wishes to treat the treatable with hospice support at home. When she returns home and declines, she would then be appropriate and is agreeable to full comfort measures.  Advance directives were considered and discussed. Ms. Findlay would like her daughter/Susan Catino to be her 36. They started paperwork prior to her hospitalization but stated they needed to be notarized. Chaplain services were offered to help her complete HCPOA and she was agreeable.   When asked about her symptoms, Ms. Bobrowski states that she has been experiencing anxiety. She takes xanex PO 0.61m at bedtime PRN at home, which we can resume. She also stated that she was having  pain in her abdomen - one time dose of morphine ordered for pain since not time for next dose yet.   Discussed with patient the importance of continued conversation with family and the medical providers regarding overall plan of care and treatment options, ensuring decisions are within the context of the patient's values and GOCs.    Questions and concerns were addressed. The family was encouraged to call with questions or concerns. PMT card was provided.   NEXT OF KIN SRoyston Bake- daughter  SUMMARY OF RECOMMENDATIONS   -Continue current medical treatment, treat the treatable for bowel perforation  -Continue DNR/DNI as previously documented -Patient's goal is to discharge home with hospice for cancer diagnosis -TOC consult placed for home hospice support - TOC and hospice liaison notified -[place] HCP[place] - patient would like daughter/Susan to be her HCPOA -Alprazolam 0.294mPO at bedtime PRN for anxiety/sleep resumed from PTA meds -One time dose morphine 18m40mV ordered for pain -PMT will continue to follow holistically  Addendum:  Spoke with Dr. AkuKarleen Hampshired hospice liaison clarifying discharge planning - patient wants to discharge when medically stable. Hospice liaison had questions around IV antibiotics, IV keppra, and IV decadron. Dr. AkuKarleen Hampshireated she would consult with the surgeon on switching antibiotics to PO. She will also transition IV keppra and IV decadron to PO when medically appropriate before discharge. Hospice liaison stated that these medications will be covered once in PO form.   -Comfort Measures order discontinued -DNR form completed and placed in shadow chart  Code Status/Advance Care Planning:  DNR    Symptom Management:   Anxiety - alprazolam 0.64m7m at bedtime resumed  Pain - one time dose morphine 18mg 9m Palliative Prophylaxis:   Aspiration, Bowel Regimen, Delirium Protocol, Frequent Pain Assessment, Oral Care and  Turn Reposition  Additional Recommendations (Limitations, Scope, Preferences):  Full Scope Treatment  Psycho-social/Spiritual:   Desire for further Chaplaincy support:yes  Created space and opportunity for patient to express thoughts and feelings regarding patient's current medical situation.  Emotional support offered.  Prognosis:   < 6 months  Discharge Planning: Home with Hospice      Primary Diagnoses: Present on Admission: **None**   I have reviewed the medical record, interviewed the patient and family, and examined the patient. The following aspects are pertinent.  Past Medical History:  Diagnosis Date  . Cancer (HCC) [place]9   cervical  . Depression   . Family history of breast cancer   . Family history of colon cancer   . Family history of ovarian cancer   . Family history of stomach cancer   . Family history of throat cancer   . Hyperlipidemia   . Hypertension   . Malignant neoplasm of lung (HCC) [place]11/2019  . Thyroid  disease    Social History   Socioeconomic History  . Marital status: Single    Spouse name: Not on file  . Number of children: 3  . Years of education: Not on file  . Highest education level: Not on file  Occupational History  . Not on file  Tobacco Use  . Smoking status: Former Smoker    Packs/day: 1.00    Years: 25.00    Pack years: 25.00    Types: Cigarettes    Quit date: 11/19/2017    Years since quitting: 1.9  . Smokeless tobacco: Never Used  Vaping Use  . Vaping Use: Never used  Substance and Sexual Activity  . Alcohol use: No  . Drug use: Yes    Types: Marijuana    Comment: occasional  . Sexual activity: Not on file  Other Topics Concern  . Not on file  Social History Narrative  . Not on file   Social Determinants of Health   Financial Resource Strain:   . Difficulty of Paying Living Expenses:   Food Insecurity:   . Worried About Charity fundraiser in the Last Year:   . Arboriculturist in the Last Year:     Transportation Needs:   . Film/video editor (Medical):   Marland Kitchen Lack of Transportation (Non-Medical):   Physical Activity:   . Days of Exercise per Week:   . Minutes of Exercise per Session:   Stress:   . Feeling of Stress :   Social Connections:   . Frequency of Communication with Friends and Family:   . Frequency of Social Gatherings with Friends and Family:   . Attends Religious Services:   . Active Member of Clubs or Organizations:   . Attends Archivist Meetings:   Marland Kitchen Marital Status:    Family History  Problem Relation Age of Onset  . Colon cancer Mother 63  . Colon cancer Maternal Grandmother 78  . Hypertension Father   . Breast cancer Sister 20  . Throat cancer Brother   . Heart attack Maternal Aunt   . Ovarian cancer Maternal Aunt 70  . Stomach cancer Paternal Grandmother        dx >50  . Throat cancer Brother    Scheduled Meds: . sodium chloride   Intravenous Once  . busPIRone  5 mg Oral Daily  . Chlorhexidine Gluconate Cloth  6 each Topical Daily  . clopidogrel  75 mg Oral Daily  . collagenase   Topical Daily  . dexamethasone (DECADRON) injection  10 mg Intravenous Q12H  . enoxaparin (LOVENOX) injection  40 mg Subcutaneous Q24H  . FLUoxetine  10 mg Oral Daily  . levothyroxine  112 mcg Oral Daily  . nystatin cream   Topical BID  . sodium chloride flush  10-40 mL Intracatheter Q12H  . umeclidinium-vilanterol  1 puff Inhalation Daily   Continuous Infusions: . sodium chloride 5 mL/hr at 11/07/19 1108  . famotidine (PEPCID) IV Stopped (11/06/19 2345)  . levETIRAcetam Stopped (11/07/19 0813)  . piperacillin-tazobactam (ZOSYN)  IV 12.5 mL/hr at 11/07/19 1108   PRN Meds:.sodium chloride, acetaminophen, albuterol, ALPRAZolam, ipratropium-albuterol, labetalol, morphine injection, sodium chloride flush Medications Prior to Admission:  Prior to Admission medications   Medication Sig Start Date End Date Taking? Authorizing Provider  ALPRAZolam (XANAX) 0.25  MG tablet Take 1 tablet (0.25 mg total) by mouth at bedtime as needed. for anxiety AS NEEDED ONLY! 09/23/19  Yes Juline Patch, MD  aspirin EC 81  MG tablet Take 81 mg by mouth daily.   Yes [provider]  atorvastatin (LIPITOR) 10 MG tablet Take 1 tablet (10 mg total) by mouth daily. 08/01/19  Yes Juline Patch, MD  busPIRone (BUSPAR) 5 MG tablet Take 1 tablet (5 mg total) by mouth daily. 08/01/19  Yes Juline Patch, MD  clopidogrel (PLAVIX) 75 MG tablet Take 1 tablet (75 mg total) by mouth daily. 09/16/19  Yes Juline Patch, MD  dexamethasone (DECADRON) 1 MG tablet Take 2 tablets (2 mg total) by mouth as directed. Take 2 tablets (32m total) by mouth 2 times a day for 1 week , then 2 tabs (214m daily for 1 week 10/20/19  Yes YuEarlie ServerMD  FLUoxetine (PROZAC) 10 MG capsule Take 1 capsule (10 mg total) by mouth daily. 08/01/19  Yes JoJuline PatchMD  furosemide (LASIX) 20 MG tablet Take 1 tablet (20 mg total) by mouth 2 (two) times daily. 10/10/19  Yes JoJuline PatchMD  levETIRAcetam (KEPPRA) 500 MG tablet Take 1 tablet (500 mg total) by mouth 2 (two) times daily. 09/30/19  Yes YuEarlie ServerMD  levothyroxine (SYNTHROID) 112 MCG tablet Take 1 tablet (112 mcg total) by mouth daily. 08/01/19  Yes JoJuline PatchMD  lidocaine (XYLOCAINE) 2 % solution Use as directed 15 mLs in the mouth or throat as needed for mouth pain. 09/15/19  Yes BuJacquelin HawkingNP  lidocaine-prilocaine (EMLA) cream Apply 1 application topically as needed.   Yes [provider]  lisinopril-hydrochlorothiazide (ZESTORETIC) 20-25 MG tablet Take 1 tablet by mouth daily. 09/01/19  Yes JoJuline PatchMD  Multiple Vitamins-Minerals (MULTIVITAMIN ADULT PO) Take 1 tablet by mouth daily.    Yes [provider]  mupirocin ointment (BACTROBAN) 2 % Apply 1 application topically 2 (two) times daily. Patient taking differently: Apply 1 application topically 2 (two) times daily. (apply to lower legs and feet) 10/10/19   Yes JoJuline PatchMD  nystatin cream (MYCOSTATIN) Apply 1 application topically 2 (two) times daily. Patient taking differently: Apply 1 application topically 2 (two) times daily. (apply to lower legs and feet) 10/10/19  Yes YuEarlie ServerMD  Olopatadine HCl 0.2 % SOLN Apply 1 drop to eye in the morning and at bedtime. 09/26/19  Yes JoJuline PatchMD  omeprazole (PRILOSEC) 20 MG capsule Take 1 capsule (20 mg total) by mouth daily. 10/07/19  Yes YuEarlie ServerMD  osimertinib mesylate (TAGRISSO) 80 MG tablet Take 1 tablet (80 mg total) by mouth daily. 09/15/19  Yes Corcoran, MeDrue SecondMD  oxyCODONE (OXY IR/ROXICODONE) 5 MG immediate release tablet Take 1 tablet (5 mg total) by mouth every 6 (six) hours as needed for severe pain. 10/19/19  Yes Burns, JeWandra FeinsteinNP  umeclidinium-vilanterol (ANORO ELLIPTA) 62.5-25 MCG/INH AEPB Inhale 1 puff into the lungs daily. 08/01/19  Yes JoJuline PatchMD  valACYclovir (VALTREX) 1000 MG tablet Take 1 tablet (1,000 mg total) by mouth 3 (three) times daily. 09/08/19  Yes Corcoran, MeDrue SecondMD  VENTOLIN HFA 108 (90 Base) MCG/ACT inhaler Inhale 1-2 puffs into the lungs every 6 (six) hours as needed for wheezing or shortness of breath. 08/01/19  Yes JoJuline PatchMD  prochlorperazine (COMPAZINE) 10 MG tablet Take 1 tablet (10 mg total) by mouth every 6 (six) hours as needed (Nausea or vomiting). Patient not taking: Reported on 03/03/2019 03/01/18 04/25/19  YuEarlie ServerMD   No Known Allergies Review of Systems  Constitutional: Positive  for fatigue.  Gastrointestinal: Positive for abdominal pain.  All other systems reviewed and are negative.   Physical Exam Constitutional:      General: She is not in acute distress. Pulmonary:     Effort: Pulmonary effort is normal. No respiratory distress.  Abdominal:     Tenderness: There is abdominal tenderness.  Skin:    General: Skin is warm and dry.  Neurological:     Mental Status: She is alert.     Motor: Weakness present.    Psychiatric:        Mood and Affect: Mood normal.        Cognition and Memory: Cognition normal.     Vital Signs: BP 129/66   Pulse 79   Temp 98.8 F (37.1 C) (Axillary)   Resp 20   Ht 5' 9"  (1.753 m)   Wt 101.8 kg   SpO2 91%   BMI 33.14 kg/m  Pain Scale: 0-10 POSS *See Group Information*: 1-Acceptable,Awake and alert Pain Score: 5    SpO2: SpO2: 91 % O2 Device:SpO2: 91 % O2 Flow Rate: .   IO: Intake/output summary:   Intake/Output Summary (Last 24 hours) at 11/07/2019 1127 Last data filed at 11/07/2019 1108 Gross per 24 hour  Intake 1412.51 ml  Output 360 ml  Net 1052.51 ml    LBM: Last BM Date: 11/06/19 Baseline Weight: Weight: 103 kg Most recent weight: Weight: 101.8 kg     Palliative Assessment/Data: PPS 40%   Discussed case with Dr. Karleen Hampshire, primary RN, TOC, hospice liaison, patient  Time In: 1000 Time Out: 1110 Time Total: 70 minutes  Greater than 50%  of this time was spent counseling and coordinating care related to the above assessment and plan.  Signed by: Lin Landsman, NP   Please contact Palliative Medicine Team phone at [phone] for questions and concerns.  For individual provider: See Shea Evans

## 2019-11-07 NOTE — Progress Notes (Signed)
Pt transferred to RM #[id number] at this time. Pt off monitor. Report given to Agricultural consultant. Pt has no complaints and no s/s of distress noted. Family aware of transfer.

## 2019-11-07 NOTE — Progress Notes (Signed)
Pt resting in bed quietly. No s/s of distress noted. Morphine given for pain upon pt request. Pt is off monitor. Comfort care order has been discontinued d/t pt wanting to be treated with antibiotics for bowel perforation. However, pt still does not want aggressive treatment or treatment for cancer. End of life visitor policy remains in place and pt will be discharged home with hospice tomorrow possibly. Will continue to administer decadron, keppra, and zosyn. Abdominal dressing and dressing on bilateral feet have been reinforced at request of patient instead of changed.

## 2019-11-07 NOTE — TOC Progression Note (Signed)
Transition of Care [facility]) - Progression Note    Patient Details  Name: Kristina Wright MRN: [id number] Date of Birth: 04/04/1956  Transition of [facility]) CM/SW [place], LCSW Phone Number: 11/07/2019, 1:16 PM  Clinical Narrative:  CSW met with patient. Son and brother-in-law at bedside. Patient confirmed plan for home with hospice services. Provided list of home hospice agencies. No preference. Referral made to Flo Shanks, RN with [facility]. No DME needs. Patient has a walker and bedside commode at home. She also has a bed with an adjustable base. Patient will still need EMS home. CSW confirmed address that was given last week.   Expected Discharge Plan: [place] Barriers to Discharge: Continued Medical Work up  Expected Discharge Plan and Services Expected Discharge Plan: [place] Choice: [place] arrangements for the past 2 months: Single Family Home                           HH Arranged: RN, PT, OT, Nurse's Aide, Social Work [employer] Agency: [place] ([place]) Date [place]: 11/04/19   Representative spoke with at [place]: [place] ([place]) Interventions    Readmission Risk Interventions Readmission Risk Prevention Plan 11/04/2019  PCP or Specialist Appt within 3-5 Days Complete  HRI or [place] Complete  Social Work Consult for [place] Planning/Counseling Complete  Palliative Care Screening Not Applicable  Some recent data might be hidden

## 2019-11-07 NOTE — Consult Note (Addendum)
[place] Nurse ostomy consult note Pt plans to discharge home with hospice.  No family present for pouch change demonstration and teaching session at this time; pt states her sister will be here visiting tomorrow and I will attempt to perform teaching at that time. Pt will need total assistance with pouching activities after discharge.  Julien Girt MSN, RN, [place], [place], [place]

## 2019-11-07 NOTE — Progress Notes (Signed)
Hematology/Oncology Progress Note [facility] Telephone:([phone] [phone]  Patient Care Team: Juline Patch, MD as PCP - General (Family Medicine) Lequita Asal, MD as Referring Physician (Hematology and Oncology) Delana Meyer, Dolores Lory, MD (Vascular Surgery) Noreene Filbert, MD as Radiation Oncologist (Radiation Oncology)   Name of the patient: Kristina Wright  [id number]  July 24, 1955  Date of visit: 11/07/19   INTERVAL HISTORY-  Patient was transferred to ICU after rapid response. Dr. Karleen Hampshire had a bad meeting with patient's daughter and sister Kristina Wright, decisions was made to change CODE STATUS to DNR and wants comfort measures.  This morning, patient's mental status improved and after palliative meeting, patient wants to be treated for abdominal infection and want to go home with home hospice service when medically stable.  Patient was seen at the bedside.  She has multiple family members in the room, including her son, and her sister Kristina Wright She appears to be more alert this a.m. Review of systems- Review of Systems  Constitutional: Positive for appetite change and fatigue.  Respiratory: Positive for cough. Negative for shortness of breath.   Gastrointestinal: Negative for abdominal pain.  Genitourinary: Negative for dysuria.   Musculoskeletal: Negative for back pain.  Skin: Negative for rash.  Neurological: Negative for light-headedness.  Hematological: Negative for adenopathy.  Psychiatric/Behavioral: Negative for confusion.    No Known Allergies  Patient Active Problem List   Diagnosis Date Noted   Palliative care by specialist    Advanced care planning/counseling discussion    DNR (do not resuscitate)    Vasogenic brain edema ([place])    Normocytic anemia    Pressure injury of skin 11/02/2019   Bowel perforation ([place]) 10/30/2019   Bilateral lower extremity edema 10/07/2019   Herpes zoster without complication 28/78/6767    Port-A-Cath in place 10/01/2019   Brain metastasis ([place]) 08/05/2019   Elevated TSH 06/20/2019   Chronic obstructive pulmonary disease ([place]) 12/13/2018   Left lower lobe pulmonary nodule 11/29/2018   Breast nodule 08/25/2018   Anxiety 08/22/2018   Genetic testing 06/11/2018   Family history of breast cancer    Family history of colon cancer    Family history of ovarian cancer    Family history of stomach cancer    Family history of throat cancer    Metastatic adenocarcinoma to lung with unknown primary site ([place]) 04/27/2018   Depression 03/12/2018   Lung cancer metastatic to brain ([place]) 03/01/2018   Goals of care, counseling/discussion 03/01/2018   Tobacco use disorder 02/08/2016   Essential hypertension 02/08/2016   PVD (peripheral vascular disease) ([place]) 02/08/2016   Blue toe syndrome of right lower extremity ([place]) 02/08/2016   Adult BMI > 30 07/31/2015   High risk medication use 07/31/2015   Carpal tunnel syndrome on both sides 10/30/2014   Postmenopause atrophic vaginitis 05/15/2014   Sensory urge incontinence 05/15/2014   Vitamin D deficiency 04/25/2014   Extremity pain 11/22/2013   Numbness 11/22/2013   Sleep disorder 11/22/2013   Chronic insomnia 11/10/2013   Foot pain, left 11/10/2013   Hypothyroidism 11/10/2013   Mixed hyperlipidemia 11/10/2013     Past Medical History:  Diagnosis Date   Cancer ([place]) 1989   cervical   Depression    Family history of breast cancer    Family history of colon cancer    Family history of ovarian cancer    Family history of stomach cancer    Family history of throat cancer    Hyperlipidemia    Hypertension  Malignant neoplasm of lung ([place]) 03/01/2018   Thyroid disease      Past Surgical History:  Procedure Laterality Date   BILATERAL CARPAL TUNNEL RELEASE     BOWEL RESECTION N/A 10/30/2019   Procedure: SMALL BOWEL RESECTION;  Surgeon: Benjamine Sprague, DO;  Location: [facility] ORS;   Service: General;  Laterality: N/A;   BREAST CYST ASPIRATION Left    LAPAROTOMY N/A 10/30/2019   Procedure: EXPLORATORY LAPAROTOMY;  Surgeon: Benjamine Sprague, DO;  Location: [facility] ORS;  Service: General;  Laterality: N/A;   LUNG BIOPSY     PARTIAL HYSTERECTOMY     PORTA CATH INSERTION N/A 03/03/2018   Procedure: PORTA CATH INSERTION;  Surgeon: Katha Cabal, MD;  Location: [facility];  Service: Cardiovascular;  Laterality: N/A;   TUBAL LIGATION      Social History   Socioeconomic History   Marital status: Single    Spouse name: Not on file   Number of children: 3   Years of education: Not on file   Highest education level: Not on file  Occupational History   Not on file  Tobacco Use   Smoking status: Former Smoker    Packs/day: 1.00    Years: 25.00    Pack years: 25.00    Types: Cigarettes    Quit date: 11/19/2017    Years since quitting: 1.9   Smokeless tobacco: Never Used  Vaping Use   Vaping Use: Never used  Substance and Sexual Activity   Alcohol use: No   Drug use: Yes    Types: Marijuana    Comment: occasional   Sexual activity: Not on file  Other Topics Concern   Not on file  Social History Narrative   Not on file   Social Determinants of Health   Financial Resource Strain:    Difficulty of Paying Living Expenses:   Food Insecurity:    Worried About Charity fundraiser in the Last Year:    Arboriculturist in the Last Year:   Transportation Needs:    Film/video editor (Medical):    Lack of Transportation (Non-Medical):   Physical Activity:    Days of Exercise per Week:    Minutes of Exercise per Session:   Stress:    Feeling of Stress :   Social Connections:    Frequency of Communication with Friends and Family:    Frequency of Social Gatherings with Friends and Family:    Attends Religious Services:    Active Member of Clubs or Organizations:    Attends Music therapist:    Marital  Status:   Intimate Partner Violence:    Fear of Current or Ex-Partner:    Emotionally Abused:    Physically Abused:    Sexually Abused:      Family History  Problem Relation Age of Onset   Colon cancer Mother 53   Colon cancer Maternal Grandmother 78   Hypertension Father    Breast cancer Sister 54   Throat cancer Brother    Heart attack Maternal Aunt    Ovarian cancer Maternal Aunt 70   Stomach cancer Paternal Grandmother        dx >50   Throat cancer Brother      Current Facility-Administered Medications:    0.9 %  sodium chloride infusion (Manually program via Guardrails IV Fluids), , Intravenous, Once, Hosie Poisson, MD   0.9 %  sodium chloride infusion, , Intravenous, PRN, Hosie Poisson, MD, Stopped at 11/07/19 [phone]  acetaminophen (TYLENOL) tablet 1,000 mg, 1,000 mg, Oral, Q6H PRN, Hosie Poisson, MD, 1,000 mg at 11/05/19 2031   albuterol (PROVENTIL) (2.5 MG/3ML) 0.083% nebulizer solution 2.5 mg, 2.5 mg, Nebulization, Q6H PRN, Hosie Poisson, MD   ALPRAZolam Duanne Moron) tablet 0.25 mg, 0.25 mg, Oral, QHS PRN, Elmer Picker M, NP   busPIRone (BUSPAR) tablet 5 mg, 5 mg, Oral, Daily, Hosie Poisson, MD, 5 mg at 11/05/19 0859   Chlorhexidine Gluconate Cloth 2 % PADS 6 each, 6 each, Topical, Daily, Hosie Poisson, MD, 6 each at 11/07/19 1107   clopidogrel (PLAVIX) tablet 75 mg, 75 mg, Oral, Daily, Hosie Poisson, MD, 75 mg at 11/05/19 0900   collagenase (SANTYL) ointment, , Topical, Daily, Hosie Poisson, MD, Given at 11/06/19 1143   dexamethasone (DECADRON) injection 10 mg, 10 mg, Intravenous, Q12H, Hosie Poisson, MD, 10 mg at 11/07/19 0904   enoxaparin (LOVENOX) injection 40 mg, 40 mg, Subcutaneous, Q24H, Karleen Hampshire, Vijaya, MD, 40 mg at 11/05/19 2015   FLUoxetine (PROZAC) capsule 10 mg, 10 mg, Oral, Daily, Karleen Hampshire, Vijaya, MD, 10 mg at 11/05/19 0900   ipratropium-albuterol (DUONEB) 0.5-2.5 (3) MG/3ML nebulizer solution 3 mL, 3 mL, Nebulization, Q6H PRN, Hosie Poisson,  MD   labetalol (NORMODYNE) injection 10 mg, 10 mg, Intravenous, Q2H PRN, Hosie Poisson, MD   levETIRAcetam (KEPPRA) tablet 500 mg, 500 mg, Oral, BID, Hosie Poisson, MD   levothyroxine (SYNTHROID) tablet 112 mcg, 112 mcg, Oral, Daily, Hosie Poisson, MD, 112 mcg at 11/07/19 0549   morphine 2 MG/ML injection 1-2 mg, 1-2 mg, Intravenous, Q2H PRN, Hosie Poisson, MD, 2 mg at 11/07/19 1508   nystatin cream (MYCOSTATIN), , Topical, BID, Karleen Hampshire, Vijaya, MD   piperacillin-tazobactam (ZOSYN) IVPB 3.375 g, 3.375 g, Intravenous, Q8H, Hosie Poisson, MD, Stopped at 11/07/19 1309   sodium chloride flush (NS) 0.9 % injection 10-40 mL, 10-40 mL, Intracatheter, Q12H, Akula, Vijaya, MD, 10 mL at 11/06/19 2104   sodium chloride flush (NS) 0.9 % injection 10-40 mL, 10-40 mL, Intracatheter, PRN, Hosie Poisson, MD   umeclidinium-vilanterol (ANORO ELLIPTA) 62.5-25 MCG/INH 1 puff, 1 puff, Inhalation, Daily, Hosie Poisson, MD, 1 puff at 11/05/19 0902   Physical exam:  Vitals:   11/07/19 0000 11/07/19 0700 11/07/19 0800 11/07/19 0900  BP: 129/66     Pulse: 79     Resp: 18 (!) 23 18 20   Temp:      TempSrc:      SpO2: 91%     Weight:      Height:       Physical Exam Constitutional:      Appearance: She is ill-appearing.  HENT:     Head: Normocephalic and atraumatic.  Eyes:     General: No scleral icterus.    Pupils: Pupils are equal, round, and reactive to light.  Cardiovascular:     Rate and Rhythm: Normal rate.  Pulmonary:     Effort: Pulmonary effort is normal. No respiratory distress.     Breath sounds: Normal breath sounds.  Abdominal:     General: There is no distension.     Palpations: Abdomen is soft.     Comments: Midline wound covered with dressing  Musculoskeletal:        General: Normal range of motion.     Comments: General anasarca  Skin:    General: Skin is warm and dry.  Neurological:     Mental Status: She is alert and oriented to person, place, and time. Mental status is at  baseline.  Psychiatric:  Mood and Affect: Mood normal.        CMP Latest Ref Rng & Units 11/06/2019  Glucose 70 - 99 mg/dL 104(H)  BUN 8 - 23 mg/dL 8  Creatinine 0.44 - 1.00 mg/dL 0.80  Sodium 135 - 145 mmol/L 138  Potassium 3.5 - 5.1 mmol/L 3.6  Chloride 98 - 111 mmol/L 105  CO2 22 - 32 mmol/L 22  Calcium 8.9 - 10.3 mg/dL 7.5(L)  Total Protein 6.5 - 8.1 g/dL -  Total Bilirubin 0.3 - 1.2 mg/dL -  Alkaline Phos 38 - 126 U/L -  AST 15 - 41 U/L -  ALT 0 - 44 U/L -   CBC Latest Ref Rng & Units 11/06/2019  WBC 4.0 - 10.5 K/uL 10.3  Hemoglobin 12.0 - 15.0 g/dL 9.4(L)  Hematocrit 36 - 46 % 27.7(L)  Platelets 150 - 400 K/uL 135(L)    RADIOGRAPHIC STUDIES: I have personally reviewed the radiological images as listed and agreed with the findings in the report. DG Abd 1 View  Result Date: 11/04/2019 CLINICAL DATA:  Ileus. EXAM: ABDOMEN - 1 VIEW COMPARISON:  October 30, 2019. FINDINGS: The bowel gas pattern is normal. Ostomy is noted in left lower quadrant. Surgical drain is noted in the pelvis. IMPRESSION: No evidence of bowel obstruction or ileus. Electronically Signed   By: Marijo Conception M.D.   On: 11/04/2019 13:07   DG Abd 1 View  Result Date: 10/30/2019 CLINICAL DATA:  64 year old female with endotracheal and enteric tube placement. EXAM: PORTABLE CHEST AND ABDOMEN 1 VIEW COMPARISON:  CT abdomen pelvis dated 10/30/2019 and chest CT dated 08/09/2019. FINDINGS: An endotracheal tube is seen with tip approximately 3.5 cm above the carina. Enteric tube extends below the diaphragm with tip and side-port in the left upper abdomen likely in the body of the stomach. Right-sided Port-A-Cath with tip in the region of the cavoatrial junction. There is mild central vascular prominence. Diffuse bilateral interstitial prominence as well as streaky densities in the left lower lung field, new or progressed since the prior CT. Findings may represent combination of vascular congestion/edema and/or  pneumonia. A small left pleural effusion may be present. No pneumothorax. The cardiac silhouette is within normal limits. Atherosclerotic calcification of the aorta. Air is noted within the colon. No bowel dilatation. A drainage catheter noted over the pelvis. Midline anterior pelvic wall cutaneous staples. No acute osseous pathology. IMPRESSION: 1. Endotracheal tube above the carina. Enteric tube with tip and side-port in the body of the stomach. 2. Probable mild vascular congestion or edema. Pneumonia is not excluded. Clinical correlation is recommended. Electronically Signed   By: Anner Crete M.D.   On: 10/30/2019 23:47   CT HEAD WO CONTRAST  Result Date: 11/06/2019 CLINICAL DATA:  Lung cancer with brain metastasis. Somnolence today. EXAM: CT HEAD WITHOUT CONTRAST TECHNIQUE: Contiguous axial images were obtained from the base of the skull through the vertex without intravenous contrast. COMPARISON:  MRI head with contrast 09/29/2019 FINDINGS: Brain: Extensive vasogenic edema in the right frontal lobe. Enhancing mass lesion is seen in the right frontal lobe on the prior MRI. 9 mm midline shift to the left due to mass-effect is similar to the prior MRI. Negative for acute hemorrhage. Chronic ischemia in the left internal capsule unchanged. Vascular: Negative for hyperdense vessel Skull: No skull lesion identified Sinuses/Orbits: Negative Other: None IMPRESSION: Right frontal metastasis with extensive surrounding vasogenic edema. 9 mm midline shift to the left. Findings similar to the prior MRI. No new acute  findings. Electronically Signed   By: Franchot Gallo M.D.   On: 11/06/2019 11:49   CT ABDOMEN PELVIS W CONTRAST  Result Date: 10/30/2019 CLINICAL DATA:  64 year old female with diffuse abdominal and pelvic pain. History of metastatic lung cancer. EXAM: CT ABDOMEN AND PELVIS WITH CONTRAST TECHNIQUE: Multidetector CT imaging of the abdomen and pelvis was performed using the standard protocol following  bolus administration of intravenous contrast. CONTRAST:  59mL OMNIPAQUE IOHEXOL 300 MG/ML  SOLN COMPARISON:  08/09/2019 FINDINGS: Lower chest: No acute abnormality. Hepatobiliary: The liver and gallbladder are unremarkable. No biliary dilatation. Pancreas: Unremarkable Spleen: Unremarkable Adrenals/Urinary Tract: The kidneys, adrenal glands and bladder are unremarkable. Stomach/Bowel: A small to moderate amount of pneumoperitoneum is present within the abdomen and pelvis, and centered adjacent to the sigmoid colon, which exhibits mild wall thickening. Colonic diverticulosis is noted. No other bowel abnormalities are identified. The appendix is unremarkable. Vascular/Lymphatic: Aortic atherosclerosis. No enlarged abdominal or pelvic lymph nodes. Reproductive: Uterus and bilateral adnexa are unremarkable. Other: A small amount free fluid in the pelvis is noted. No discrete abscess is present. Musculoskeletal: No acute or suspicious bony abnormalities are noted. IMPRESSION: 1. Bowel perforation with small to moderate amount of pneumoperitoneum within the abdomen and pelvis. Pneumoperitoneum is centered adjacent to mildly thickened sigmoid colon, most likely representing perforation of the sigmoid colon, of uncertain etiology. Small amount of free fluid in the pelvis. No discrete abscess. 2. Aortic Atherosclerosis (ICD10-I70.0). Critical Value/emergent results were called by telephone at the time of interpretation on 10/30/2019 at 4:40 pm to provider Laban Emperor, who verbally acknowledged these results. Electronically Signed   By: Margarette Canada M.D.   On: 10/30/2019 16:42   DG Chest Port 1 View  Result Date: 10/30/2019 CLINICAL DATA:  64 year old female with endotracheal and enteric tube placement. EXAM: PORTABLE CHEST AND ABDOMEN 1 VIEW COMPARISON:  CT abdomen pelvis dated 10/30/2019 and chest CT dated 08/09/2019. FINDINGS: An endotracheal tube is seen with tip approximately 3.5 cm above the carina. Enteric tube  extends below the diaphragm with tip and side-port in the left upper abdomen likely in the body of the stomach. Right-sided Port-A-Cath with tip in the region of the cavoatrial junction. There is mild central vascular prominence. Diffuse bilateral interstitial prominence as well as streaky densities in the left lower lung field, new or progressed since the prior CT. Findings may represent combination of vascular congestion/edema and/or pneumonia. A small left pleural effusion may be present. No pneumothorax. The cardiac silhouette is within normal limits. Atherosclerotic calcification of the aorta. Air is noted within the colon. No bowel dilatation. A drainage catheter noted over the pelvis. Midline anterior pelvic wall cutaneous staples. No acute osseous pathology. IMPRESSION: 1. Endotracheal tube above the carina. Enteric tube with tip and side-port in the body of the stomach. 2. Probable mild vascular congestion or edema. Pneumonia is not excluded. Clinical correlation is recommended. Electronically Signed   By: Anner Crete M.D.   On: 10/30/2019 23:47    Assessment and plan-  Patient is a 64 y.o. 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  #Decreased mental status, 11/06/2019, CT head without contrast showed extensive vasogenic edema in the right frontal lobe, ALC mass lesion is seen in the right frontal lobe.  9 mm midline shift into the left due to mass-effect similar to  prior MRI. Patient has had history of brain radiation, radiation necrosis, she was previously on a tapering course of dexamethasone continue current hospitalization. Patient's mental status has improved after started on dexamethasone.  Continue IV Dex. GI prophylaxis with Pepcid, Keppra  for seizure prophylaxis. Glycemic control.  Aspiration precaution.  Neurochecks.  #Perforated diverticulitis status post sigmoid resection with midline wound infection, currently on IV Zosyn.  Managed by surgery. #Stage IV lung cancer with brain metastasis status post radiation, Previously on osimertinib with her extracranial lung cancer disease well controlled. Osimertinib is currently on hold given acute issue and patient's decision of proceeding with comfort measures.  #Goals of care, she is currently DNR/DNI, I had a lengthy discussion with patient, along with her family members, including sister and son at the bedside. Patient has made decision to continue treatments for abdominal wound infection and then when medically stable be discharged home for hospice. Today as her mental status is better, her sister Kristina Wright has expressed her wishes of wanting her sister to proceed with additional cancer treatments.  She also understands that the final decision will be from the patient.  Thank you for allowing me to participate in the care of this patient.  We will follow up during her inpatient course.  Earlie Server, MD, PhD Hematology Oncology [facility] at [facility] Pager- [phone] 11/07/2019

## 2019-11-07 NOTE — Progress Notes (Signed)
[place] visited pt. as follow-up from prior visit last week and per palliative care referral for discussion of AD.  Pt. in bed watching TV; shared she feels better now that medical team have removed the tubes she had connected last time [facility] visited her; she is considering discharge w/home hospice.  Pt. expressed that her children were not prepared fully for the reality of having to take care of her; 'they're spoiled... I spoiled them I guess,' she said ruefully.  [facility] brought pt. a prayer shawl because pt. works at [employer] where the prayer shawls are knitted and prayed over; pt. grateful.  Pt.'s pastor visited and provided communion to her; pt.'s sister, brother-in-law, and youngest son also visited during [facility] visit.  Pt. appears well-supported at this time. [place] asked pt. about the need to complete AD; pt. says she has form filled out and awaiting notarization at home.  Ronan provided [facility] pastoral care phone numbers in case pt. needs help getting document finalized.  [place] plans to follow up tomorrow if possible.      11/07/19 1130  Clinical Encounter Type  Visited With Patient;Health care provider;Family  Visit Type Follow-up;Psychological support;Spiritual support;Social support;Critical Care  Referral From Patient;Palliative care team  Consult/Referral To Chaplain  Spiritual Encounters  Spiritual Needs Emotional;Ritual  Stress Factors  Patient Stress Factors Health changes;Family relationships  Advance Directives (For Healthcare)  Does Patient Have a Medical Advance Directive? No  Would patient like information on creating a medical advance directive? Yes (Inpatient - patient requests chaplain consult to create a medical advance directive)

## 2019-11-08 ENCOUNTER — Encounter (INDEPENDENT_AMBULATORY_CARE_PROVIDER_SITE_OTHER): Payer: Medicaid Other

## 2019-11-08 ENCOUNTER — Inpatient Hospital Stay: Payer: 59

## 2019-11-08 ENCOUNTER — Telehealth: Payer: Self-pay | Admitting: *Deleted

## 2019-11-08 ENCOUNTER — Ambulatory Visit (INDEPENDENT_AMBULATORY_CARE_PROVIDER_SITE_OTHER): Payer: Medicaid Other | Admitting: Nurse Practitioner

## 2019-11-08 ENCOUNTER — Encounter: Payer: Self-pay | Admitting: Surgery

## 2019-11-08 DIAGNOSIS — K631 Perforation of intestine (nontraumatic): Secondary | ICD-10-CM | POA: Diagnosis not present

## 2019-11-08 LAB — CBC WITH DIFFERENTIAL/PLATELET
Abs Immature Granulocytes: 0.27 10*3/uL — ABNORMAL HIGH (ref 0.00–0.07)
Basophils Absolute: 0 10*3/uL (ref 0.0–0.1)
Basophils Relative: 0 %
Eosinophils Absolute: 0 10*3/uL (ref 0.0–0.5)
Eosinophils Relative: 0 %
HCT: 29.1 % — ABNORMAL LOW (ref 36.0–46.0)
Hemoglobin: 9.3 g/dL — ABNORMAL LOW (ref 12.0–15.0)
Immature Granulocytes: 3 %
Lymphocytes Relative: 12 %
Lymphs Abs: 1.2 10*3/uL (ref 0.7–4.0)
MCH: 30.8 pg (ref 26.0–34.0)
MCHC: 32 g/dL (ref 30.0–36.0)
MCV: 96.4 fL (ref 80.0–100.0)
Monocytes Absolute: 0.4 10*3/uL (ref 0.1–1.0)
Monocytes Relative: 4 %
Neutro Abs: 8 10*3/uL — ABNORMAL HIGH (ref 1.7–7.7)
Neutrophils Relative %: 81 %
Platelets: 163 10*3/uL (ref 150–400)
RBC: 3.02 MIL/uL — ABNORMAL LOW (ref 3.87–5.11)
RDW: 15.6 % — ABNORMAL HIGH (ref 11.5–15.5)
WBC: 9.9 10*3/uL (ref 4.0–10.5)
nRBC: 0.3 % — ABNORMAL HIGH (ref 0.0–0.2)

## 2019-11-08 LAB — GLUCOSE, CAPILLARY
Glucose-Capillary: 129 mg/dL — ABNORMAL HIGH (ref 70–99)
Glucose-Capillary: 185 mg/dL — ABNORMAL HIGH (ref 70–99)

## 2019-11-08 LAB — BASIC METABOLIC PANEL
Anion gap: 8 (ref 5–15)
BUN: 13 mg/dL (ref 8–23)
CO2: 28 mmol/L (ref 22–32)
Calcium: 8.1 mg/dL — ABNORMAL LOW (ref 8.9–10.3)
Chloride: 103 mmol/L (ref 98–111)
Creatinine, Ser: 0.83 mg/dL (ref 0.44–1.00)
GFR calc Af Amer: >60 mL/min (ref >60)
GFR calc non Af Amer: >60 mL/min (ref >60)
Glucose, Bld: 156 mg/dL — ABNORMAL HIGH (ref 70–99)
Potassium: 3.2 mmol/L — ABNORMAL LOW (ref 3.5–5.1)
Sodium: 139 mmol/L (ref 135–145)

## 2019-11-08 MED ORDER — INSULIN ASPART 100 UNIT/ML ~~LOC~~ SOLN
0.0000 [IU] | Freq: Three times a day (TID) | SUBCUTANEOUS | Status: DC
  Administered 2019-11-08: 1 [IU] via SUBCUTANEOUS
  Administered 2019-11-09: 2 [IU] via SUBCUTANEOUS
  Administered 2019-11-09: 5 [IU] via SUBCUTANEOUS
  Administered 2019-11-10 – 2019-11-13 (×5): 1 [IU] via SUBCUTANEOUS
  Administered 2019-11-14: 5 [IU] via SUBCUTANEOUS
  Administered 2019-11-14 – 2019-11-18 (×3): 2 [IU] via SUBCUTANEOUS
  Administered 2019-11-18: 1 [IU] via SUBCUTANEOUS
  Administered 2019-11-19: 2 [IU] via SUBCUTANEOUS
  Administered 2019-11-20 (×2): 1 [IU] via SUBCUTANEOUS
  Administered 2019-11-20 – 2019-11-21 (×2): 2 [IU] via SUBCUTANEOUS
  Filled 2019-11-08 (×18): qty 1

## 2019-11-08 MED ORDER — DEXAMETHASONE 4 MG PO TABS
8.0000 mg | ORAL_TABLET | Freq: Two times a day (BID) | ORAL | Status: DC
  Administered 2019-11-08 – 2019-11-11 (×6): 8 mg via ORAL
  Filled 2019-11-08 (×7): qty 2

## 2019-11-08 MED ORDER — IOHEXOL 9 MG/ML PO SOLN
500.0000 mL | ORAL | Status: AC
  Administered 2019-11-08 (×2): 500 mL via ORAL

## 2019-11-08 MED ORDER — IOHEXOL 300 MG/ML  SOLN
100.0000 mL | Freq: Once | INTRAMUSCULAR | Status: AC | PRN
  Administered 2019-11-08: 100 mL via INTRAVENOUS

## 2019-11-08 MED ORDER — DAKINS (1/4 STRENGTH) 0.125 % EX SOLN
1.0000 "application " | Freq: Every day | CUTANEOUS | Status: AC
  Administered 2019-11-08 – 2019-11-10 (×3): 1
  Filled 2019-11-08: qty 473

## 2019-11-08 NOTE — Progress Notes (Signed)
Hematology/Oncology Progress Note [facility] Telephone:([phone] [phone]  Patient Care Team: Juline Patch, MD as PCP - General (Family Medicine) Lequita Asal, MD as Referring Physician (Hematology and Oncology) Delana Meyer, Dolores Lory, MD (Vascular Surgery) Noreene Filbert, MD as Radiation Oncologist (Radiation Oncology)   Name of the patient: Kristina Wright  [id number]  1956/03/01  Date of visit: 11/08/19   INTERVAL HISTORY-  Patient was transferred out of ICU.  Her mental status has returned to baseline. No new complaints.  She is resting comfortably in the bed.  Review of systems- Review of Systems  Constitutional: Positive for appetite change and fatigue.  Respiratory: Positive for cough. Negative for shortness of breath.   Gastrointestinal: Negative for abdominal pain.  Genitourinary: Negative for dysuria.   Musculoskeletal: Negative for back pain.  Skin: Negative for rash.  Neurological: Negative for light-headedness.  Hematological: Negative for adenopathy.  Psychiatric/Behavioral: Negative for confusion.    No Known Allergies  Patient Active Problem List   Diagnosis Date Noted  . Palliative care by specialist   . Advanced care planning/counseling discussion   . DNR (do not resuscitate)   . Vasogenic brain edema ([place])   . Normocytic anemia   . Pressure injury of skin 11/02/2019  . Bowel perforation ([place]) 10/30/2019  . Bilateral lower extremity edema 10/07/2019  . Herpes zoster without complication 64/40/3474  . Port-A-Cath in place 10/01/2019  . Brain metastasis ([place]) 08/05/2019  . Elevated TSH 06/20/2019  . Chronic obstructive pulmonary disease ([place]) 12/13/2018  . Left lower lobe pulmonary nodule 11/29/2018  . Breast nodule 08/25/2018  . Anxiety 08/22/2018  . Genetic testing 06/11/2018  . Family history of breast cancer   . Family history of colon cancer   . Family history of ovarian cancer   . Family history of  stomach cancer   . Family history of throat cancer   . Metastatic adenocarcinoma to lung with unknown primary site [facility]) 04/27/2018  . Depression 03/12/2018  . Lung cancer metastatic to brain ([place]) 03/01/2018  . Goals of care, counseling/discussion 03/01/2018  . Tobacco use disorder 02/08/2016  . Essential hypertension 02/08/2016  . PVD (peripheral vascular disease) ([place]) 02/08/2016  . Blue toe syndrome of right lower extremity ([place]) 02/08/2016  . Adult BMI > 30 07/31/2015  . High risk medication use 07/31/2015  . Carpal tunnel syndrome on both sides 10/30/2014  . Postmenopause atrophic vaginitis 05/15/2014  . Sensory urge incontinence 05/15/2014  . Vitamin D deficiency 04/25/2014  . Extremity pain 11/22/2013  . Numbness 11/22/2013  . Sleep disorder 11/22/2013  . Chronic insomnia 11/10/2013  . Foot pain, left 11/10/2013  . Hypothyroidism 11/10/2013  . Mixed hyperlipidemia 11/10/2013     Past Medical History:  Diagnosis Date  . Cancer ([place]) 1989   cervical  . Depression   . Family history of breast cancer   . Family history of colon cancer   . Family history of ovarian cancer   . Family history of stomach cancer   . Family history of throat cancer   . Hyperlipidemia   . Hypertension   . Malignant neoplasm of lung ([place]) 03/01/2018  . Thyroid disease      Past Surgical History:  Procedure Laterality Date  . BILATERAL CARPAL TUNNEL RELEASE    . BOWEL RESECTION N/A 10/30/2019   Procedure: SMALL BOWEL RESECTION;  Surgeon: Benjamine Sprague, DO;  Location: [facility] ORS;  Service: General;  Laterality: N/A;  . BREAST CYST ASPIRATION Left   .  LAPAROTOMY N/A 10/30/2019   Procedure: EXPLORATORY LAPAROTOMY;  Surgeon: Benjamine Sprague, DO;  Location: [facility] ORS;  Service: General;  Laterality: N/A;  . LUNG BIOPSY    . PARTIAL HYSTERECTOMY    . PORTA CATH INSERTION N/A 03/03/2018   Procedure: PORTA CATH INSERTION;  Surgeon: Katha Cabal, MD;  Location: [place] CV LAB;  Service:  Cardiovascular;  Laterality: N/A;  . TUBAL LIGATION      Social History   Socioeconomic History  . Marital status: Single    Spouse name: Not on file  . Number of children: 3  . Years of education: Not on file  . Highest education level: Not on file  Occupational History  . Not on file  Tobacco Use  . Smoking status: Former Smoker    Packs/day: 1.00    Years: 25.00    Pack years: 25.00    Types: Cigarettes    Quit date: 11/19/2017    Years since quitting: 1.9  . Smokeless tobacco: Never Used  Vaping Use  . Vaping Use: Never used  Substance and Sexual Activity  . Alcohol use: No  . Drug use: Yes    Types: Marijuana    Comment: occasional  . Sexual activity: Not on file  Other Topics Concern  . Not on file  Social History Narrative  . Not on file   Social Determinants of Health   Financial Resource Strain:   . Difficulty of Paying Living Expenses:   Food Insecurity:   . Worried About Charity fundraiser in the Last Year:   . Arboriculturist in the Last Year:   Transportation Needs:   . Film/video editor (Medical):   Marland Kitchen Lack of Transportation (Non-Medical):   Physical Activity:   . Days of Exercise per Week:   . Minutes of Exercise per Session:   Stress:   . Feeling of Stress :   Social Connections:   . Frequency of Communication with Friends and Family:   . Frequency of Social Gatherings with Friends and Family:   . Attends Religious Services:   . Active Member of Clubs or Organizations:   . Attends Archivist Meetings:   Marland Kitchen Marital Status:   Intimate Partner Violence:   . Fear of Current or Ex-Partner:   . Emotionally Abused:   Marland Kitchen Physically Abused:   . Sexually Abused:      Family History  Problem Relation Age of Onset  . Colon cancer Mother 73  . Colon cancer Maternal Grandmother 78  . Hypertension Father   . Breast cancer Sister 61  . Throat cancer Brother   . Heart attack Maternal Aunt   . Ovarian cancer Maternal Aunt 70  .  Stomach cancer Paternal Grandmother        dx >50  . Throat cancer Brother      Current Facility-Administered Medications:  .  0.9 %  sodium chloride infusion (Manually program via Guardrails IV Fluids), , Intravenous, Once, Hosie Poisson, MD .  0.9 %  sodium chloride infusion, , Intravenous, PRN, Hosie Poisson, MD, Stopped at 11/07/19 1509 .  acetaminophen (TYLENOL) tablet 1,000 mg, 1,000 mg, Oral, Q6H PRN, Hosie Poisson, MD, 1,000 mg at 11/07/19 1900 .  albuterol (PROVENTIL) (2.5 MG/3ML) 0.083% nebulizer solution 2.5 mg, 2.5 mg, Nebulization, Q6H PRN, Hosie Poisson, MD .  ALPRAZolam Duanne Moron) tablet 0.25 mg, 0.25 mg, Oral, QHS PRN, Elmer Picker M, NP, 0.25 mg at 11/08/19 0225 .  busPIRone (BUSPAR) tablet 5  mg, 5 mg, Oral, Daily, Hosie Poisson, MD, 5 mg at 11/08/19 0912 .  Chlorhexidine Gluconate Cloth 2 % PADS 6 each, 6 each, Topical, Daily, Hosie Poisson, MD, 6 each at 11/08/19 0913 .  clopidogrel (PLAVIX) tablet 75 mg, 75 mg, Oral, Daily, Hosie Poisson, MD, 75 mg at 11/08/19 0912 .  collagenase (SANTYL) ointment, , Topical, Daily, Hosie Poisson, MD, Given by Other at 11/08/19 0914 .  dexamethasone (DECADRON) injection 10 mg, 10 mg, Intravenous, Q12H, Hosie Poisson, MD, 10 mg at 11/08/19 0912 .  enoxaparin (LOVENOX) injection 40 mg, 40 mg, Subcutaneous, Q24H, Hosie Poisson, MD, 40 mg at 11/07/19 2232 .  FLUoxetine (PROZAC) capsule 10 mg, 10 mg, Oral, Daily, Hosie Poisson, MD, 10 mg at 11/08/19 0912 .  iohexol (OMNIPAQUE) 9 MG/ML oral solution 500 mL, 500 mL, Oral, Q1 Hr x 2, Sakai, Isami, DO, 500 mL at 11/08/19 1153 .  ipratropium-albuterol (DUONEB) 0.5-2.5 (3) MG/3ML nebulizer solution 3 mL, 3 mL, Nebulization, Q6H PRN, Hosie Poisson, MD .  labetalol (NORMODYNE) injection 10 mg, 10 mg, Intravenous, Q2H PRN, Hosie Poisson, MD .  levETIRAcetam (KEPPRA) tablet 500 mg, 500 mg, Oral, BID, Hosie Poisson, MD, 500 mg at 11/08/19 0912 .  levothyroxine (SYNTHROID) tablet 112 mcg, 112 mcg, Oral, Daily,  Hosie Poisson, MD, 112 mcg at 11/08/19 0727 .  morphine 2 MG/ML injection 1-2 mg, 1-2 mg, Intravenous, Q2H PRN, Hosie Poisson, MD, 2 mg at 11/08/19 1200 .  nystatin cream (MYCOSTATIN), , Topical, BID, Hosie Poisson, MD, Given at 11/08/19 0919 .  piperacillin-tazobactam (ZOSYN) IVPB 3.375 g, 3.375 g, Intravenous, Q8H, Akula, Vijaya, MD, Last Rate: 12.5 mL/hr at 11/08/19 0924, 3.375 g at 11/08/19 0924 .  polyvinyl alcohol (LIQUIFILM TEARS) 1.4 % ophthalmic solution 1 drop, 1 drop, Both Eyes, PRN, Hosie Poisson, MD, 1 drop at 11/08/19 0226 .  sodium chloride flush (NS) 0.9 % injection 10-40 mL, 10-40 mL, Intracatheter, Q12H, Hosie Poisson, MD, 10 mL at 11/08/19 0913 .  sodium chloride flush (NS) 0.9 % injection 10-40 mL, 10-40 mL, Intracatheter, PRN, Hosie Poisson, MD .  sodium hypochlorite (DAKIN'S 1/4 STRENGTH) topical solution 1 application, 1 application, Irrigation, Daily, Sakai, Isami, DO .  umeclidinium-vilanterol (ANORO ELLIPTA) 62.5-25 MCG/INH 1 puff, 1 puff, Inhalation, Daily, Hosie Poisson, MD, 1 puff at 11/08/19 1154   Physical exam:  Vitals:   11/07/19 0900 11/07/19 1851 11/07/19 2000 11/08/19 0635  BP:  (!) 158/83 (!) 154/82 (!) 142/68  Pulse:  89 80 73  Resp: 20 16 20 20   Temp:  98.1 F (36.7 C) 98 F (36.7 C) (!) 97.5 F (36.4 C)  TempSrc:  Oral Oral Oral  SpO2:  96% 97% 97%  Weight:      Height:       Physical Exam Constitutional:      Appearance: She is ill-appearing.  HENT:     Head: Normocephalic and atraumatic.  Eyes:     General: No scleral icterus.    Pupils: Pupils are equal, round, and reactive to light.  Cardiovascular:     Rate and Rhythm: Normal rate.  Pulmonary:     Effort: Pulmonary effort is normal. No respiratory distress.     Breath sounds: Normal breath sounds.  Abdominal:     General: There is no distension.     Palpations: Abdomen is soft.     Comments: Midline wound covered with dressing  Musculoskeletal:        General: Normal range of  motion.     Comments: General  anasarca  Skin:    General: Skin is warm and dry.  Neurological:     Mental Status: She is alert and oriented to person, place, and time. Mental status is at baseline.  Psychiatric:        Mood and Affect: Mood normal.        CMP Latest Ref Rng & Units 11/06/2019  Glucose 70 - 99 mg/dL 104(H)  BUN 8 - 23 mg/dL 8  Creatinine 0.44 - 1.00 mg/dL 0.80  Sodium 135 - 145 mmol/L 138  Potassium 3.5 - 5.1 mmol/L 3.6  Chloride 98 - 111 mmol/L 105  CO2 22 - 32 mmol/L 22  Calcium 8.9 - 10.3 mg/dL 7.5(L)  Total Protein 6.5 - 8.1 g/dL -  Total Bilirubin 0.3 - 1.2 mg/dL -  Alkaline Phos 38 - 126 U/L -  AST 15 - 41 U/L -  ALT 0 - 44 U/L -   CBC Latest Ref Rng & Units 11/06/2019  WBC 4.0 - 10.5 K/uL 10.3  Hemoglobin 12.0 - 15.0 g/dL 9.4(L)  Hematocrit 36 - 46 % 27.7(L)  Platelets 150 - 400 K/uL 135(L)    RADIOGRAPHIC STUDIES: I have personally reviewed the radiological images as listed and agreed with the findings in the report. DG Abd 1 View  Result Date: 11/04/2019 CLINICAL DATA:  Ileus. EXAM: ABDOMEN - 1 VIEW COMPARISON:  October 30, 2019. FINDINGS: The bowel gas pattern is normal. Ostomy is noted in left lower quadrant. Surgical drain is noted in the pelvis. IMPRESSION: No evidence of bowel obstruction or ileus. Electronically Signed   By: Marijo Conception M.D.   On: 11/04/2019 13:07   DG Abd 1 View  Result Date: 10/30/2019 CLINICAL DATA:  64 year old female with endotracheal and enteric tube placement. EXAM: PORTABLE CHEST AND ABDOMEN 1 VIEW COMPARISON:  CT abdomen pelvis dated 10/30/2019 and chest CT dated 08/09/2019. FINDINGS: An endotracheal tube is seen with tip approximately 3.5 cm above the carina. Enteric tube extends below the diaphragm with tip and side-port in the left upper abdomen likely in the body of the stomach. Right-sided Port-A-Cath with tip in the region of the cavoatrial junction. There is mild central vascular prominence. Diffuse bilateral  interstitial prominence as well as streaky densities in the left lower lung field, new or progressed since the prior CT. Findings may represent combination of vascular congestion/edema and/or pneumonia. A small left pleural effusion may be present. No pneumothorax. The cardiac silhouette is within normal limits. Atherosclerotic calcification of the aorta. Air is noted within the colon. No bowel dilatation. A drainage catheter noted over the pelvis. Midline anterior pelvic wall cutaneous staples. No acute osseous pathology. IMPRESSION: 1. Endotracheal tube above the carina. Enteric tube with tip and side-port in the body of the stomach. 2. Probable mild vascular congestion or edema. Pneumonia is not excluded. Clinical correlation is recommended. Electronically Signed   By: Anner Crete M.D.   On: 10/30/2019 23:47   CT HEAD WO CONTRAST  Result Date: 11/06/2019 CLINICAL DATA:  Lung cancer with brain metastasis. Somnolence today. EXAM: CT HEAD WITHOUT CONTRAST TECHNIQUE: Contiguous axial images were obtained from the base of the skull through the vertex without intravenous contrast. COMPARISON:  MRI head with contrast 09/29/2019 FINDINGS: Brain: Extensive vasogenic edema in the right frontal lobe. Enhancing mass lesion is seen in the right frontal lobe on the prior MRI. 9 mm midline shift to the left due to mass-effect is similar to the prior MRI. Negative for acute hemorrhage. Chronic ischemia in  the left internal capsule unchanged. Vascular: Negative for hyperdense vessel Skull: No skull lesion identified Sinuses/Orbits: Negative Other: None IMPRESSION: Right frontal metastasis with extensive surrounding vasogenic edema. 9 mm midline shift to the left. Findings similar to the prior MRI. No new acute findings. Electronically Signed   By: Franchot Gallo M.D.   On: 11/06/2019 11:49   CT ABDOMEN PELVIS W CONTRAST  Result Date: 10/30/2019 CLINICAL DATA:  64 year old female with diffuse abdominal and pelvic  pain. History of metastatic lung cancer. EXAM: CT ABDOMEN AND PELVIS WITH CONTRAST TECHNIQUE: Multidetector CT imaging of the abdomen and pelvis was performed using the standard protocol following bolus administration of intravenous contrast. CONTRAST:  84mL OMNIPAQUE IOHEXOL 300 MG/ML  SOLN COMPARISON:  08/09/2019 FINDINGS: Lower chest: No acute abnormality. Hepatobiliary: The liver and gallbladder are unremarkable. No biliary dilatation. Pancreas: Unremarkable Spleen: Unremarkable Adrenals/Urinary Tract: The kidneys, adrenal glands and bladder are unremarkable. Stomach/Bowel: A small to moderate amount of pneumoperitoneum is present within the abdomen and pelvis, and centered adjacent to the sigmoid colon, which exhibits mild wall thickening. Colonic diverticulosis is noted. No other bowel abnormalities are identified. The appendix is unremarkable. Vascular/Lymphatic: Aortic atherosclerosis. No enlarged abdominal or pelvic lymph nodes. Reproductive: Uterus and bilateral adnexa are unremarkable. Other: A small amount free fluid in the pelvis is noted. No discrete abscess is present. Musculoskeletal: No acute or suspicious bony abnormalities are noted. IMPRESSION: 1. Bowel perforation with small to moderate amount of pneumoperitoneum within the abdomen and pelvis. Pneumoperitoneum is centered adjacent to mildly thickened sigmoid colon, most likely representing perforation of the sigmoid colon, of uncertain etiology. Small amount of free fluid in the pelvis. No discrete abscess. 2. Aortic Atherosclerosis (ICD10-I70.0). Critical Value/emergent results were called by telephone at the time of interpretation on 10/30/2019 at 4:40 pm to provider Laban Emperor, who verbally acknowledged these results. Electronically Signed   By: Margarette Canada M.D.   On: 10/30/2019 16:42   DG Chest Port 1 View  Result Date: 10/30/2019 CLINICAL DATA:  64 year old female with endotracheal and enteric tube placement. EXAM: PORTABLE CHEST AND  ABDOMEN 1 VIEW COMPARISON:  CT abdomen pelvis dated 10/30/2019 and chest CT dated 08/09/2019. FINDINGS: An endotracheal tube is seen with tip approximately 3.5 cm above the carina. Enteric tube extends below the diaphragm with tip and side-port in the left upper abdomen likely in the body of the stomach. Right-sided Port-A-Cath with tip in the region of the cavoatrial junction. There is mild central vascular prominence. Diffuse bilateral interstitial prominence as well as streaky densities in the left lower lung field, new or progressed since the prior CT. Findings may represent combination of vascular congestion/edema and/or pneumonia. A small left pleural effusion may be present. No pneumothorax. The cardiac silhouette is within normal limits. Atherosclerotic calcification of the aorta. Air is noted within the colon. No bowel dilatation. A drainage catheter noted over the pelvis. Midline anterior pelvic wall cutaneous staples. No acute osseous pathology. IMPRESSION: 1. Endotracheal tube above the carina. Enteric tube with tip and side-port in the body of the stomach. 2. Probable mild vascular congestion or edema. Pneumonia is not excluded. Clinical correlation is recommended. Electronically Signed   By: Anner Crete M.D.   On: 10/30/2019 23:47    Assessment and plan-  Patient is a 64 y.o. 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  #Decreased mental status, 11/06/2019, CT head without contrast showed extensive vasogenic edema in the right frontal lobe, ALC mass lesion is seen in the right frontal lobe.  9 mm midline shift into the left due to mass-effect similar to prior MRI. Patient has had history of brain radiation, radiation necrosis, she was previously on a  tapering course of dexamethasone continue current hospitalization. Mental status has improved to close to her baseline.  Continue dexamethasone, switch to oral dexamethasone 8 mg twice daily. GI prophylaxis with Pepcid, Keppra for seizure prophylaxis. Glycemic control.  Aspiration precaution.  Marland Kitchen  #Perforated diverticulitis status post sigmoid resection with midline wound infection, currently on IV Zosyn.  Managed by surgery. #Stage IV lung cancer with brain metastasis status post radiation, Previously on osimertinib with her extracranial lung cancer disease well controlled. Osimertinib is currently on hold given acute issue and patient's decision of proceeding with comfort measures.  #Goals of care, she is currently DNR/DNI, on comfort measure.  I discussed with her as well as her daughter Manuela Schwartz over the phone. Both pt and Manuela Schwartz confirm that pt want to get necessary treatment for wound infection and be discharged to home hospice.  I discussed with surgery regarding her need of IV antibiotics. Defer to surgery.  Dr.Sakai ordered additional images for evaluation.   Thank you for allowing me to participate in the care of this patient.  We will follow up during her inpatient course.  Earlie Server, MD, PhD Hematology Oncology [facility] at [facility] Pager- [phone] 11/08/2019

## 2019-11-08 NOTE — Progress Notes (Signed)
Manufacturing engineer hospital Liaison note:  Follow up visit made to new referral for [facility] hospice services at home.  Patient seen sitting up in bed alert and able to interact with Probation officer. Much emotional support given. Per chart note review and patient she will have a follow up abdominal CT today to evaluate the drainage noted in the JP drain. No plans for discharge at this time. Will continue to follow.  Flo Shanks BSN, RN, [place] [phone]

## 2019-11-08 NOTE — Progress Notes (Signed)
Subjective:  CC: Kristina Wright is a 64 y.o. female  Hospital stay day 9, 9 Days Post-Op Hartman's procedure for perforated diverticulitis  HPI: Placed on comfort care after declining mental status over the past couple days.  She improved with some steroids and asked that she transition over to home hospice instead.  Surgery called back in for continued care of her midline wound and ostomy.  She reports continued stool output from her ostomy.  ROS:  General: Denies weight loss, weight gain, fatigue, fevers, chills, and night sweats. Heart: Denies chest pain, palpitations, racing heart, irregular heartbeat, leg pain or swelling, and decreased activity tolerance. Respiratory: Denies breathing difficulty, shortness of breath, wheezing, cough, and sputum. GI: Denies change in appetite, heartburn, nausea, vomiting, constipation, diarrhea, and blood in stool. GU: Denies difficulty urinating, pain with urinating, urgency, frequency, blood in urine.   Objective:   Temp:  [97.5 F (36.4 C)-98.1 F (36.7 C)] 97.5 F (36.4 C) (07/20 0635) Pulse Rate:  [73-89] 73 (07/20 0635) Resp:  [16-20] 20 (07/20 0635) BP: (142-158)/(68-83) 142/68 (07/20 0635) SpO2:  [96 %-97 %] 97 % (07/20 0635)     Height: 5\' 9"  (175.3 cm) Weight: 101.8 kg BMI (Calculated): 33.13   Intake/Output this shift:   Intake/Output Summary (Last 24 hours) at 11/08/2019 1108 Last data filed at 11/08/2019 7169 Gross per 24 hour  Intake 464.62 ml  Output 375 ml  Net 89.62 ml    Constitutional :  alert, cooperative, appears stated age and no distress  Respiratory:  clear to auscultation bilaterally  Cardiovascular:  regular rate and rhythm  Gastrointestinal: .  Soft, no guarding, midline incision packing with persistent blue-green output consistent with Pseudomonas infection.  No evidence of expanding erythema or cellulitis from area.  Fascia feels intact, but with moderate exudative overlying entire incision length.  Ostomy bag  with some gas and melanotic liquid again, mucosa improving with more patches of visible pink area.   Skin: Cool and moist  Psychiatric: Normal affect, non-agitated, not confused       LABS:  CMP Latest Ref Rng & Units 11/06/2019 11/05/2019 11/04/2019  Glucose 70 - 99 mg/dL 104(H) 109(H) 92  BUN 8 - 23 mg/dL 8 10 10   Creatinine 0.44 - 1.00 mg/dL 0.80 0.75 0.73  Sodium 135 - 145 mmol/L 138 138 136  Potassium 3.5 - 5.1 mmol/L 3.6 3.6 3.1(L)  Chloride 98 - 111 mmol/L 105 104 104  CO2 22 - 32 mmol/L 22 25 23   Calcium 8.9 - 10.3 mg/dL 7.5(L) 8.1(L) 7.3(L)  Total Protein 6.5 - 8.1 g/dL - - -  Total Bilirubin 0.3 - 1.2 mg/dL - - -  Alkaline Phos 38 - 126 U/L - - -  AST 15 - 41 U/L - - -  ALT 0 - 44 U/L - - -   CBC Latest Ref Rng & Units 11/06/2019 11/04/2019 11/03/2019  WBC 4.0 - 10.5 K/uL 10.3 9.9 11.4(H)  Hemoglobin 12.0 - 15.0 g/dL 9.4(L) 9.7(L) 9.8(L)  Hematocrit 36 - 46 % 27.7(L) 28.6(L) 29.0(L)  Platelets 150 - 400 K/uL 135(L) 129(L) 145(L)    RADS: n/a  Assessment:   Status post Hartman's procedure for perforated diverticulitis, midline wound infection with pseudomonas. Continue dakin's.  Wound now opened essentially entire length to facilitate better drainage of persistent infection.  Fascia still remains intact but she is at high risk of dehiscence, evisceration secondary to her steroid use.  With the discussion of home hospice, I discussed with the patient that  midline wound will need close monitoring with daily dressing changes and frequent outpatient visits for wound checks.  I explained to her that due to her concurrent medical issues, the high risk of continued complications, and also delayed wound healing.  Will need to obtain CT scan to see if she has developed another intra-abdominal abscess secondary to purulent output from JP drain.   Ostomy patent and functional now per report.  Mucosa is clinically looking better on exam today.  Continue with pouch exchange as  needed.  Chronic medical issue care per hospitalist.  Surgery to continue to follow

## 2019-11-08 NOTE — Consult Note (Addendum)
[facility] Nurse ostomy follow up Teaching session performed with patient.  Attempted to have 2 different sisters watch the procedure, but neither one was able to come in for a teaching session; patient talked to them on phone calls.  Made a video on the patient's phone for family members to review later.  Pt states she is familiar with pouch application and emptying process since she helped her mother previously when she had an ostomy. Hand held mirror used to allow patient to view the location. She assisted with application and asked appropriate questions and was able to open and close the velcro to empty.  She plans to have assistance from Hospice after discharge.  Stoma type/location:LLQ colostomy; 1 1/2 inches, oval, flush with skin level. Stoma is 30% black, 70% yellow slough, strong odor, mod amt semiformed stool.   Peristomal assessment:intact skin surrounding Treatment options for stomal/peristomal skin:barrier ring and 2 piece pouch Ostomy pouching: 2pc.2 3/4" Pouch with barrier ring  Education provided:Applied barrier ring and 2 piece pouching system.  Reviewed pouching routines and ordering supplies. 4 sets of barrier rings/wafers/pouches left in the room for use after discharge.  Enrolled patient in [place] Start Discharge program: Yes; previously Julien Girt MSN, [place], [place], [place], [place]

## 2019-11-08 NOTE — Telephone Encounter (Signed)
Patient has been referred to Hospice and they are asking if Dr Tasia Catchings will serve as attending. Please advise

## 2019-11-08 NOTE — Telephone Encounter (Signed)
Yes. Message given to the hospice referral center.

## 2019-11-08 NOTE — Progress Notes (Signed)
PROGRESS NOTE    Kristina Wright  KKX:[id number] DOB: 08-15-1955 DOA: 10/30/2019 PCP: Juline Patch, MD   Chief Complaint  Patient presents with   Diarrhea    Brief Narrative:   64 year old with history of hypothyroidism, stage IV lung cancer with brain mets hypertension, hyperlipidemia, depression, cervical cancer presents to [facility] on 10/30/2019 by EMS from home for diarrhea and right lower quadrant pain for 7 to 9 days.  She underwent a CT abdomen and pelvis revealing perforated bowel with small to moderate amount of pneumoperitoneum within the abdomen and pelvis.  General surgery consulted and she underwent emergent exploratory laparotomy with lysis of additions, abdominal washout and sigmoid colon resection with colostomy by Dr. Lysle Pearl on 7/11.  Intraoperatively she was intubated and transferred to ICU.  She was extubated on 10/31/19 and transferred to [facility] service. NG tube was removed post op day 1 and was started on clear liquid diet advanced to regular diet.  Hospital course complicated by somnolence and acute encephalopathy,. Ordered stat CT head without contrast for further evaluation of somnolence and altered mental status. CT head showed  Right frontal metastasis with extensive surrounding vasogenic edema. 9 mm midline shift to the left. She was started on IV decadron 10 mg BID and transferred to ICU.  Bedside meeting with the daughter and sister on the evening of 11/06/19, they changed the code status to DNR and wanted comfort measures. After the initiation of IV steroids, pt's mental status improved and after further discussions with palliative care, patient wanted to be treated for the midline wound with IV antibiotics and wanted to go home with hospice services when medically stable. Comfort measures were removed and gen surgery requested to see the patient for her midline abd wound and ostomy care.   Patient seen and examined at bedside today. She is alert,but still very confused. She  is asking about her neighbors.   Assessment & Plan:   Active Problems:   Lung cancer metastatic to brain [facility])   Bowel perforation (HCC)   Pressure injury of skin   Vasogenic brain edema (HCC)   Normocytic anemia   Palliative care by specialist   Advanced care planning/counseling discussion   DNR (do not resuscitate)   Perforated diverticulitis s/p emergent exploratory laparotomy with lysis of adhesions, sigmoid colon resection with colostomy by Dr. Lysle Pearl and mid line wound infection.  NG tube was removed on 11/02/19 and clear liquid diet started.  She was started on clear liquid diet advanced to regular diet without any symptoms of nausea, vomiting or increased abd pain. She has stool in the ostomy bag.  Continue with IV Zosyn for wound infection. Patient remains afebrile and WBC count within normal limits.  Currently on regular diet.  Patient continued to have pus draining from the mid line would. Continue DAkin's and wound is opened entire length to facilitate better drainage of persistent infection.  Plan for CT abdomen to see if she has developed another intra abdominal abscess.   Acute encephalopathy Probably from the vaso genic edema from the brain mets.  On IV decadron. She ws transferred to ICU as for AMS and tachycardia on the evening of 7/18. She is not on comfort measures any more, would like her abd wound to be treated before transitioning to home hospice .     Essential hypertension Sub optimally controlled. Will start her on PRN hydralazine.    Stage IV lung cancer and mets Outpatient follow-up with oncology Continue with Keppra for seizure  prophylaxis.    Anxiety and depression -Started the patient on her home meds BuSpar and Prozac. Xanax added to her meds.    Acute anemia of blood loss Baseline hemoglobin around 12.  Hemoglobin dropped from 11.2 to 9.3 to 9.1 to 8.6 to 9/8 to 9.7 to 9.4. Transfuse to keep hemoglobin in the 7. Anemia panel revealed low iron  levels at 24, elevated ferritin, folate of 11.2 and vitamin B12 664.  Iron supplementation will be added once she is ready for discharge.    Twitching around the mouth  Resolved spontaneously Neurology consulted to evaluate for seizure, suggested patient probably has some facial spasms.  No seizure activity and no indication for further work-up  Pressure injury Pressure Injury 11/01/19 Sacrum Mid Stage 2 -  Partial thickness loss of dermis presenting as a shallow open injury with a red, pink wound bed without slough. pink (Active)  11/01/19 1856  Location: Sacrum  Location Orientation: Mid  Staging: Stage 2 -  Partial thickness loss of dermis presenting as a shallow open injury with a red, pink wound bed without slough.  Wound Description (Comments): pink  Present on Admission:    Wound care consulted and recommendations given    Mild Thrombocytopenia Continue to monitor. Platelets at 135000 and stable.   Hypokalemia Replaced   Hypophosphatemia Replaced.    Diabetes mellitus.  Well-controlled CBGs CBG (last 3)  Recent Labs    11/06/19 1212 11/06/19 1520 11/06/19 1536  GLUCAP 109* 193* 186*   Hemoglobin A1c at 6.5. Restart her on SSI.     DVT prophylaxis: Lovenox Code Status: Full code Family Communication: None at bedside disposition:   Status is: Inpatient  Remains inpatient appropriate because:IV treatments appropriate due to intensity of illness or inability to take PO   Dispo: The patient is from: Home              Anticipated d/c is to: Home, pt refusing SNF. Plan for home hospice when stable.               Anticipated d/c date is: 2 days              Patient currently is not medically stable to d/c.       Consultants:   General surgery  Neurology  Palliative care      Procedures: S/P Hartman's Procedure for perforated Diverticulitis.  Antimicrobials:  Anti-infectives (From admission, onward)   Start     Dose/Rate Route Frequency  Ordered Stop   10/31/19 0200  piperacillin-tazobactam (ZOSYN) IVPB 3.375 g     Discontinue     3.375 g 12.5 mL/hr over 240 Minutes Intravenous Every 8 hours 10/30/19 2343     10/30/19 1700  piperacillin-tazobactam (ZOSYN) IVPB 3.375 g        3.375 g 100 mL/hr over 30 Minutes Intravenous  Once 10/30/19 1645 10/30/19 1817       Subjective: Confused, but able to answer simple questions.   Objective: Vitals:   11/07/19 1851 11/07/19 2000 11/08/19 0635 11/08/19 1355  BP: (!) 158/83 (!) 154/82 (!) 142/68 (!) 171/73  Pulse: 89 80 73 86  Resp: 16 20 20 20   Temp: 98.1 F (36.7 C) 98 F (36.7 C) (!) 97.5 F (36.4 C) 98.2 F (36.8 C)  TempSrc: Oral Oral Oral Oral  SpO2: 96% 97% 97% 97%  Weight:      Height:        Intake/Output Summary (Last 24 hours) at 11/08/2019 1408 Last data  filed at 11/08/2019 0924 Gross per 24 hour  Intake 344.62 ml  Output 375 ml  Net -30.38 ml   Filed Weights   10/30/19 1452 10/30/19 2248 11/06/19 1538  Weight: 103 kg 100.6 kg 101.8 kg    Examination:  General exam: alert and comfortable. Not in distress.  Respiratory system: Diminished air entry at bases, no wheezing or rhonchi Cardiovascular system: S1-S2 heard, regular rate rhythm, no JVD Gastrointestinal system: Abdomen is soft, mild generalized tenderness midline abdominal wound, bowel sounds normal,  ostomy in place with brown stool Central nervous system: Patient is alert but still confused.  Asking about neighbors.  Able to move all extremities Extremities: Bilateral leg edema with stasis ulcers bandaged.   skin: Stage II sacral decubitus ulcer Psychiatry: Cannot be assessed     Data Reviewed: I have personally reviewed following labs and imaging studies  CBC: Recent Labs  Lab 11/02/19 0433 11/03/19 0644 11/04/19 0442 11/06/19 0735  WBC 14.5* 11.4* 9.9 10.3  HGB 8.6* 9.8* 9.7* 9.4*  HCT 26.5* 29.0* 28.6* 27.7*  MCV 93.0 91.2 90.8 90.5  PLT 148* 145* 129* 135*    Basic  Metabolic Panel: Recent Labs  Lab 11/02/19 0433 11/03/19 0644 11/04/19 0442 11/05/19 0437 11/06/19 0735  NA 136 134* 136 138 138  K 3.4* 3.8 3.1* 3.6 3.6  CL 106 102 104 104 105  CO2 21* 20* 23 25 22   GLUCOSE 75 113* 92 109* 104*  BUN 23 14 10 10 8   CREATININE 0.78 0.85 0.73 0.75 0.80  CALCIUM 7.5* 7.8* 7.3* 8.1* 7.5*  MG 2.1 2.1 1.8 2.0 1.8  PHOS 2.2* 2.0* 2.6 2.1* 2.8    GFR: Estimated Creatinine Clearance: 93.7 mL/min (by C-G formula based on SCr of 0.8 mg/dL).  Liver Function Tests: No results for input(s): AST, ALT, ALKPHOS, BILITOT, PROT, ALBUMIN in the last 168 hours.  CBG: Recent Labs  Lab 11/06/19 0408 11/06/19 0736 11/06/19 1212 11/06/19 1520 11/06/19 1536  GLUCAP 104* 97 109* 193* 186*     Recent Results (from the past 240 hour(s))  SARS Coronavirus 2 by RT PCR (hospital order, performed in [facility] hospital lab) Nasopharyngeal Nasopharyngeal Swab     Status: None   Collection Time: 10/30/19  5:04 PM   Specimen: Nasopharyngeal Swab  Result Value Ref Range Status   SARS Coronavirus 2 NEGATIVE NEGATIVE Final    Comment: (NOTE) SARS-CoV-2 target nucleic acids are NOT DETECTED.  The SARS-CoV-2 RNA is generally detectable in upper and lower respiratory specimens during the acute phase of infection. The lowest concentration of SARS-CoV-2 viral copies this assay can detect is 250 copies / mL. A negative result does not preclude SARS-CoV-2 infection and should not be used as the sole basis for treatment or other patient management decisions.  A negative result may occur with improper specimen collection / handling, submission of specimen other than nasopharyngeal swab, presence of viral mutation(s) within the areas targeted by this assay, and inadequate number of viral copies (<250 copies / mL). A negative result must be combined with clinical observations, patient history, and epidemiological information.  Fact Sheet for Patients:     StrictlyIdeas.no  Fact Sheet for Healthcare Providers: BankingDealers.co.za  This test is not yet approved or  cleared by the [place] FDA and has been authorized for detection and/or diagnosis of SARS-CoV-2 by FDA under an Emergency Use Authorization (EUA).  This EUA will remain in effect (meaning this test can be used) for the duration of the COVID-19  declaration under Section 564(b)(1) of the Act, 21 U.S.C. section 360bbb-3(b)(1), unless the authorization is terminated or revoked sooner.  Performed at [facility], [address] [place], [place] [address]   Blood culture (routine x 2)     Status: None   Collection Time: 10/30/19  5:26 PM   Specimen: BLOOD  Result Value Ref Range Status   Specimen Description BLOOD BLOOD LEFT HAND  Final   Special Requests   Final    BOTTLES DRAWN AEROBIC AND ANAEROBIC Blood Culture results may not be optimal due to an inadequate volume of blood received in culture bottles   Culture   Final    NO GROWTH 5 DAYS Performed at [facility], [address] [place], [place] [address]    Report Status 11/04/2019 FINAL  Final  Blood culture (routine x 2)     Status: None   Collection Time: 10/30/19  5:36 PM   Specimen: BLOOD  Result Value Ref Range Status   Specimen Description BLOOD BLOOD RIGHT WRIST  Final   Special Requests   Final    AEROBIC BOTTLE ONLY Blood Culture results may not be optimal due to an inadequate volume of blood received in culture bottles   Culture   Final    NO GROWTH 5 DAYS Performed at [facility], [address], [place], [place] [address]    Report Status 11/04/2019 FINAL  Final  Urine culture     Status: None   Collection Time: 10/30/19 11:18 PM   Specimen: Urine, Random  Result Value Ref Range Status   Specimen Description   Final    URINE, RANDOM Performed at [facility], [address], [place], [place] [address]     Special Requests   Final    NONE Performed at [facility], [address], [place], [place] [address]    Culture   Final    NO GROWTH Performed at [facility], [place] [address] [place], [place] [address]    Report Status 11/01/2019 FINAL  Final  MRSA PCR Screening     Status: None   Collection Time: 10/31/19  1:42 AM   Specimen: Nasal Mucosa; Nasopharyngeal  Result Value Ref Range Status   MRSA by PCR NEGATIVE NEGATIVE Final    Comment:        The GeneXpert MRSA Assay (FDA approved for NASAL specimens only), is one component of a comprehensive MRSA colonization surveillance program. It is not intended to diagnose MRSA infection nor to guide or monitor treatment for MRSA infections. Performed at [facility], [address] [place],  [address]          Radiology Studies: No results found.      Scheduled Meds:  sodium chloride   Intravenous Once   busPIRone  5 mg Oral Daily   Chlorhexidine Gluconate Cloth  6 each Topical Daily   clopidogrel  75 mg Oral Daily   collagenase   Topical Daily   dexamethasone (DECADRON) injection  10 mg Intravenous Q12H   enoxaparin (LOVENOX) injection  40 mg Subcutaneous Q24H   FLUoxetine  10 mg Oral Daily   levETIRAcetam  500 mg Oral BID   levothyroxine  112 mcg Oral Daily   nystatin cream   Topical BID   sodium chloride flush  10-40 mL Intracatheter Q12H   sodium hypochlorite  1 application Irrigation Daily   umeclidinium-vilanterol  1 puff Inhalation Daily   Continuous Infusions:  sodium chloride  Stopped (11/07/19 1509)   piperacillin-tazobactam (ZOSYN)  IV 3.375 g (11/08/19 0924)     LOS: 9 days       Hosie Poisson, MD [facility]   To contact the attending provider between 7A-7P or the covering provider during after hours 7P-7A, please log into the web site [email or web address] and access using universal [place] password for that web site. If you do not have  the password, please call the hospital operator.  11/08/2019, 2:08 PM

## 2019-11-08 NOTE — Progress Notes (Signed)
Pharmacy Antibiotic Note  Kristina Wright is a 64 y.o. female admitted on 10/30/2019 with diverticulitis.  Pharmacy was consulted for zosyn dosing.   Hospital stay day 5, 5 Days Post-Op Hartman's procedure for perforated diverticulitis. Since admission her leukocytosis has resolved, renal function has been stable,  no recent febrile episodes and a clear liquid diet has been started. This is day  # 9 of IV Zosyn, with noted persistent blue-green output consistent with Pseudomonas infection per Dr Lysle Pearl  Plan:  Continue Zosyn 3.375g IV q8h (4 hour infusion).  Height: 5\' 9"  (175.3 cm) Weight: 101.8 kg (224 lb 6.9 oz) IBW/kg (Calculated) : 66.2  Temp (24hrs), Avg:97.9 F (36.6 C), Min:97.5 F (36.4 C), Max:98.1 F (36.7 C)  Recent Labs  Lab 11/02/19 0433 11/03/19 0644 11/04/19 0442 11/05/19 0437 11/06/19 0735  WBC 14.5* 11.4* 9.9  --  10.3  CREATININE 0.78 0.85 0.73 0.75 0.80    Estimated Creatinine Clearance: 93.7 mL/min (by C-G formula based on SCr of 0.8 mg/dL).    No Known Allergies  Antimicrobials this admission: Zosyn 7/11 >>   Microbiology results: 7/11 BCx: NG final 7/11 UCx: negative  7/11 SARS CoV-2: negative  7/12 MRSA PCR: negative  Thank you for allowing pharmacy to be a part of this patient's care.  Dallie Piles 11/08/2019 1:00 PM

## 2019-11-09 DIAGNOSIS — C3490 Malignant neoplasm of unspecified part of unspecified bronchus or lung: Secondary | ICD-10-CM | POA: Diagnosis not present

## 2019-11-09 DIAGNOSIS — K631 Perforation of intestine (nontraumatic): Secondary | ICD-10-CM | POA: Diagnosis not present

## 2019-11-09 DIAGNOSIS — G936 Cerebral edema: Secondary | ICD-10-CM | POA: Diagnosis not present

## 2019-11-09 DIAGNOSIS — D649 Anemia, unspecified: Secondary | ICD-10-CM | POA: Diagnosis not present

## 2019-11-09 LAB — GLUCOSE, CAPILLARY
Glucose-Capillary: 108 mg/dL — ABNORMAL HIGH (ref 70–99)
Glucose-Capillary: 291 mg/dL — ABNORMAL HIGH (ref 70–99)
Glucose-Capillary: 152 mg/dL — ABNORMAL HIGH (ref 70–99)
Glucose-Capillary: 85 mg/dL (ref 70–99)

## 2019-11-09 MED ORDER — MORPHINE SULFATE (PF) 4 MG/ML IV SOLN: 1.0000 mg | INTRAVENOUS | Status: AC | PRN

## 2019-11-09 MED ORDER — CIPROFLOXACIN HCL 500 MG PO TABS
500.0000 mg | ORAL_TABLET | Freq: Two times a day (BID) | ORAL | Status: DC
  Administered 2019-11-10 – 2019-11-15 (×11): 500 mg via ORAL
  Filled 2019-11-09 (×11): qty 1

## 2019-11-09 NOTE — Progress Notes (Signed)
Subjective:  CC: Kristina Wright is a 64 y.o. female  Hospital stay day 10, 10 Days Post-Op Hartman's procedure for perforated diverticulitis  HPI: No acute issues overnight.  ROS:  General: Denies weight loss, weight gain, fatigue, fevers, chills, and night sweats. Heart: Denies chest pain, palpitations, racing heart, irregular heartbeat, leg pain or swelling, and decreased activity tolerance. Respiratory: Denies breathing difficulty, shortness of breath, wheezing, cough, and sputum. GI: Denies change in appetite, heartburn, nausea, vomiting, constipation, diarrhea, and blood in stool. GU: Denies difficulty urinating, pain with urinating, urgency, frequency, blood in urine.   Objective:   Temp:  [97.4 F (36.3 C)-98.2 F (36.8 C)] 97.8 F (36.6 C) (07/21 0519) Pulse Rate:  [76-101] 76 (07/21 0519) Resp:  [16-20] 16 (07/21 0519) BP: (134-171)/(67-73) 148/72 (07/21 0519) SpO2:  [95 %-97 %] 96 % (07/21 0519)     Height: 5\' 9"  (175.3 cm) Weight: 101.8 kg BMI (Calculated): 33.13   Intake/Output this shift:   Intake/Output Summary (Last 24 hours) at 11/09/2019 1115 Last data filed at 11/09/2019 0426 Gross per 24 hour  Intake 161.55 ml  Output 3000 ml  Net -2838.45 ml    Constitutional :  alert, cooperative, appears stated age and no distress  Respiratory:  clear to auscultation bilaterally  Cardiovascular:  regular rate and rhythm  Gastrointestinal: .  Soft, no guarding, midline incision packing recently changed. No evidence of expanding erythema or cellulitis from area.  Fascia feels intact, but with decreased exudative material  overlying entire incision length, compared to yesterday.  Ostomy bag with some gas and actual stool today.   Skin: Cool and moist  Psychiatric: Normal affect, non-agitated, not confused       LABS:  CMP Latest Ref Rng & Units 11/08/2019 11/06/2019 11/05/2019  Glucose 70 - 99 mg/dL 156(H) 104(H) 109(H)  BUN 8 - 23 mg/dL 13 8 10   Creatinine 0.44 - 1.00  mg/dL 0.83 0.80 0.75  Sodium 135 - 145 mmol/L 139 138 138  Potassium 3.5 - 5.1 mmol/L 3.2(L) 3.6 3.6  Chloride 98 - 111 mmol/L 103 105 104  CO2 22 - 32 mmol/L 28 22 25   Calcium 8.9 - 10.3 mg/dL 8.1(L) 7.5(L) 8.1(L)  Total Protein 6.5 - 8.1 g/dL - - -  Total Bilirubin 0.3 - 1.2 mg/dL - - -  Alkaline Phos 38 - 126 U/L - - -  AST 15 - 41 U/L - - -  ALT 0 - 44 U/L - - -   CBC Latest Ref Rng & Units 11/08/2019 11/06/2019 11/04/2019  WBC 4.0 - 10.5 K/uL 9.9 10.3 9.9  Hemoglobin 12.0 - 15.0 g/dL 9.3(L) 9.4(L) 9.7(L)  Hematocrit 36 - 46 % 29.1(L) 27.7(L) 28.6(L)  Platelets 150 - 400 K/uL 163 135(L) 129(L)    RADS: CLINICAL DATA:  64 year old female with concern for abscess or infection. Status post recent emergent exploratory laparotomy for bowel perforation.  EXAM: CT ABDOMEN AND PELVIS WITH CONTRAST  TECHNIQUE: Multidetector CT imaging of the abdomen and pelvis was performed using the standard protocol following bolus administration of intravenous contrast.  CONTRAST:  110mL OMNIPAQUE IOHEXOL 300 MG/ML  SOLN  COMPARISON:  CT abdomen pelvis dated 10/30/2019.  FINDINGS: Lower chest: Minimal bibasilar atelectasis.  There is a small pocket of extraluminal air in the left upper abdomen, possibly postoperative. No large pneumoperitoneum. Small free fluid noted within the pelvis.  Hepatobiliary: Slight irregularity of the liver contour may represent early changes of cirrhosis. Clinical correlation is recommended. No intrahepatic biliary ductal dilatation.  The gallbladder is unremarkable.  Pancreas: Unremarkable. No pancreatic ductal dilatation or surrounding inflammatory changes.  Spleen: Normal in size without focal abnormality.  Adrenals/Urinary Tract: The adrenal glands unremarkable. There is no hydronephrosis on either side. There is symmetric enhancement and excretion of contrast by both kidneys. The visualized ureters and urinary bladder appear  unremarkable.  Stomach/Bowel: There is postsurgical changes of sigmoid resection with a left lower quadrant colostomy. There is a 2 cm duodenal diverticulum without active inflammatory changes. Oral contrast noted in the small bowel. There is no evidence of bowel obstruction. The appendix is normal. There is a 5.2 x 4.2 cm fluid collection with somewhat surrounding inflammatory changes within the pelvis which may be postoperative. Developing abscess or infection is not excluded. Clinical correlation is recommended. A drainage catheter with tip within the pelvis above the bladder.  Vascular/Lymphatic: Advanced aortoiliac atherosclerotic disease. The IVC is unremarkable. No portal venous gas. There is no adenopathy.  Reproductive: The uterus and ovaries are grossly unremarkable.  Other: Midline vertical anterior pelvic wall surgical incision with open wound and packing material. No drainable fluid collection. There is mild stranding of the subcutaneous soft tissues of the hips.  Musculoskeletal: Mild degenerative changes. No acute osseous pathology.  IMPRESSION: 1. Postsurgical changes of sigmoid resection with a left lower quadrant colostomy. 2. A 5.2 x 4.2 cm fluid collection with somewhat surrounding inflammatory changes within the pelvis likely postoperative. Developing abscess or infection is not excluded. Clinical correlation is recommended. 3. Small pocket of extraluminal air in the left upper abdomen, possibly postoperative. No large pneumoperitoneum. 4. No bowel obstruction. Normal appendix. 5. Aortic Atherosclerosis (ICD10-I70.0).   Electronically Signed   By: Anner Crete M.D.   On: 11/08/2019 16:09   Assessment:   Status post Hartman's procedure for perforated diverticulitis, midline wound infection with pseudomonas.    Pt requested to stay inhouse for few more days to gain better mobility. She is ok to be d/c'd from my standpoint whenever she  feels ready to go. fluid collection noted on CT likely will continue to be evacuated by drain already in place. IV abx can be switched to oral abx. She is currently stable otherwise, so recommend continuing dakin's wet to dry daily dressing changes for another week, then switch to regular wet to dry dressing changes daily. JP should remain in place until further notice. continue current ostomy care.  Surgery will see every other day for midline wound check while still inhouse.  She will need to f/u as output on a weekly basis for wound checks once d/c'd.  Pt states she has a neighbor who is a Therapist, sports that can help with care at home along with home hospice.

## 2019-11-09 NOTE — Progress Notes (Signed)
[facility] hospital liaison  Note:  Follow up visit made to new referral for [facility] hospice services at home. Patient continues on IV antibiotics, steroids and Keppra have transitioned to oral. Patient stated that she now wants "to do everything to stay alive". She pointed to the DNR bracelet and stated "I don't know why I have this" and "my sister saw it and was upset". Writer confirmed that the DNR was only a DO Not Resuscitate, that if her heart were to stop it meant no chest compressions would be given. She then stated " well I want them to restart my heart". Again assurance was made that she had the right to change her mind. She then talked about going home with hospice and getting "free stuff' and related that she had a neighbor that would help with dressing changes and that she her self could change the colostomy since she had done it for her mother many years ago.  Although she greeted Probation officer by name, she repeated several things during the visit and also was certain she had only been in the hospital for 5 days. She also talked about PT/OT visit today and that she "may need rehab" to be able to get stronger to go home. She stated several times " I am not ready to go home". Emotional support given.  Discussed with staff RN Caryl Comes and [facility] that discharge plan at this time is unclear. [place] Scrape. Will continue to follow. Thank you. Flo Shanks BSN, RN, [place] [phone]

## 2019-11-09 NOTE — Evaluation (Signed)
Occupational Therapy Evaluation Patient Details Name: Kristina Wright MRN: [id number] DOB: 19-Sep-1955 Today's Date: 11/09/2019    History of Present Illness 64 yo female admitted with generalized weakness and diarrhea. Found to have bowel perforation s/p exploratory laparotomy requiring bowel resection along with colostomy and remained mechanically intubated postop. Patient extubated 10/31/19. History of lung cancer with brain mets, COPD, bilateral LE wounds    Clinical Impression   [name] was seen for OT/PT co-evaluation/treatment session this date. Prior to hospital admission, pt was generally independent in ADL/IADL management. She endorses living alone in a 1 level home with 4 STE.  Currently pt demonstrates impairments as described below (See OT problem list) which functionally limit her ability to perform ADL/self-care tasks. Pt currently requires +2 MOD-MAX assist for bed mobility and bed-level care. She attempts functional mobility this date, but is unable to perform STS given +2 MAX A with a RW. Pt would benefit from skilled OT to address noted impairments and functional limitations (see below for any additional details) in order to maximize safety and independence while minimizing falls risk and caregiver burden. Upon hospital discharge, recommend STR to maximize pt safety and return to PLOF.      Follow Up Recommendations  SNF;Supervision/Assistance - 24 hour    Equipment Recommendations   (Pt has necessary equipment.)    Recommendations for Other Services       Precautions / Restrictions Precautions Precautions: Fall Restrictions Weight Bearing Restrictions: No      Mobility Bed Mobility Overal bed mobility: Needs Assistance Bed Mobility: Supine to Sit;Sit to Supine     Supine to sit: Min assist;+2 for physical assistance;Mod assist Sit to supine: Mod assist;Max assist;+2 for physical assistance   General bed mobility comments: cues needed and encouragement but good  effort  Transfers Overall transfer level: Needs assistance   Transfers: Sit to/from Stand Sit to Stand: Max assist;+2 physical assistance;From elevated surface         General transfer comment: unable to fully stand.  1st attempt some hip clearance but on 2nd only shifted weight forward.    Balance Overall balance assessment: Needs assistance Sitting-balance support: Feet supported;Bilateral upper extremity supported Sitting balance-Leahy Scale: Fair Sitting balance - Comments: able to sit unsupported today but supervision for safety   Standing balance support: Bilateral upper extremity supported Standing balance-Leahy Scale: Zero Standing balance comment: uanble to stand                           ADL either performed or assessed with clinical judgement   ADL Overall ADL's : Needs assistance/impaired                                       General ADL Comments: Pt is signigicantly functionally limited by BLE pain/weakness, decreased activity tolerance and generalized weakness in BUE. She requires +2 MOD A to come to sitting at EOB. Pt is unable to attempt mobility at this time. Anticipate MAX A to don LB clothing and for bathing. Bed level for toileting with MAX A for peri-care.     Vision Baseline Vision/History: Wears glasses Wears Glasses: At all times Patient Visual Report: No change from baseline       Perception     Praxis      Pertinent Vitals/Pain Pain Assessment: 0-10 Pain Score: 5  Faces Pain Scale: Hurts a little  bit Pain Location: Abdominal pain as well as generalized pain in her back and feet. Pain Descriptors / Indicators: Constant;Discomfort Pain Intervention(s): Limited activity within patient's tolerance;Monitored during session;Repositioned     Hand Dominance Right   Extremity/Trunk Assessment Upper Extremity Assessment Upper Extremity Assessment: Generalized weakness   Lower Extremity Assessment Lower Extremity  Assessment: Generalized weakness       Communication Communication Communication: No difficulties   Cognition Arousal/Alertness: Awake/alert Behavior During Therapy: WFL for tasks assessed/performed Overall Cognitive Status: Within Functional Limits for tasks assessed                                 General Comments: Pt is awake alert, oriented to self, place, and situation. She is agreable to attempting mobility this date with +2 assist.   General Comments  Pt noted with red drainage coming from bilateral LE this date. RN in room to assess at end of session.    Exercises Other Exercises Other Exercises: Pt educated on role of OT in acute setting, falls prevention strategies, bed mobility techniques and safe transfer strategies this date.   Shoulder Instructions      Home Living Family/patient expects to be discharged to:: Private residence Living Arrangements: Alone;Other (Comment) (son here during summer) Available Help at Discharge: Available PRN/intermittently Type of Home: House Home Access: Stairs to enter Technical brewer of Steps: 4   Home Layout: One level                   Additional Comments: patient reports she has sister that lives next door and she might plan to discharge to her daughter's home for additional help       Prior Functioning/Environment Level of Independence: Independent        Comments: patient reports recent decline in mobility and weakness, however at baseline patient is independent with mobility per her report         OT Problem List: Decreased strength;Decreased coordination;Pain;Decreased activity tolerance;Decreased safety awareness;Impaired balance (sitting and/or standing)      OT Treatment/Interventions: Self-care/ADL training;Therapeutic exercise;Therapeutic activities;DME and/or AE instruction;Patient/family education;Balance training    OT Goals(Current goals can be found in the care plan section)  Acute Rehab OT Goals Patient Stated Goal: to go home  OT Goal Formulation: With patient Time For Goal Achievement: 11/23/19 Potential to Achieve Goals: Good ADL Goals Pt Will Perform Grooming: sitting;with set-up;with supervision Pt Will Perform Lower Body Dressing: with adaptive equipment;sit to/from stand;with mod assist (c LRAD PRN for improved safety and functional indep.) Pt Will Transfer to Toilet: stand pivot transfer;bedside commode;with min assist;with +2 assist (c LRAD PRN for improved safety and functional indep.) Pt Will Perform Toileting - Clothing Manipulation and hygiene: with min assist;with adaptive equipment;sit to/from stand (c +2 Min A; LRAD PRN for improved safety and functional indep.)  OT Frequency: Min 2X/week   Barriers to D/C: Decreased caregiver support;Inaccessible home environment          Co-evaluation PT/OT/SLP Co-Evaluation/Treatment: Yes Reason for Co-Treatment: Complexity of the patient's impairments (multi-system involvement) PT goals addressed during session: Mobility/safety with mobility;Balance;Strengthening/ROM OT goals addressed during session: ADL's and self-care;Proper use of Adaptive equipment and DME      AM-PAC OT "6 Clicks" Daily Activity     Outcome Measure Help from another person eating meals?: A Little Help from another person taking care of personal grooming?: A Little Help from another person toileting, which includes using toliet, bedpan,  or urinal?: A Lot Help from another person bathing (including washing, rinsing, drying)?: A Lot Help from another person to put on and taking off regular upper body clothing?: A Little Help from another person to put on and taking off regular lower body clothing?: A Lot 6 Click Score: 15   End of Session Equipment Utilized During Treatment: Gait belt;Rolling walker Nurse Communication: Other (comment);Mobility status (Drainage from BLE bandages)  Activity Tolerance: Patient tolerated treatment  well Patient left: in bed;with call bell/phone within reach;with bed alarm set  OT Visit Diagnosis: Other abnormalities of gait and mobility (R26.89);Muscle weakness (generalized) (M62.81);Pain Pain - Right/Left:  (both) Pain - part of body: Ankle and joints of foot;Leg (Abdominal pain.)                Time: 7282-0601 OT Time Calculation (min): 29 min Charges:  OT General Charges $OT Visit: 1 Visit OT Evaluation $OT Eval Moderate Complexity: 1 Mod  Shara Blazing, M.S., OTR/L Ascom: [phone] 11/09/19, 12:58 PM

## 2019-11-09 NOTE — Progress Notes (Signed)
PROGRESS NOTE    Kristina Wright  ZMO:[id number] DOB: [birth year] DOA: 10/30/2019 PCP: Juline Patch, MD   Brief Narrative: Kristina Wright is a 64 y.o. female with a history of hypothyroidism, stage IV lung cancer with brain metastasis, hypertension, hyperlipidemia, depression, cervical cancer.  Patient presents secondary to diarrhea and abdominal pain.  CT abdomen and pelvis was revealing for a perforated bowel secondary to perforated diverticulitis.  Patient was emergently managed in the OR and underwent exploratory laparotomy with lysis of adhesions, abdominal washout, sigmoid colon resection with colostomy.  Hospitalization was complicated by metabolic encephalopathy secondary to vasogenic edema in setting of metastatic lesions which responded to steroid therapy.  Hospitalization was also complicated by abdominal wound infection managed on IV antibiotics and wound care per general surgery.  Plan for discharge home with hospice.   Assessment & Plan:   Active Problems:   Primary malignant neoplasm of lung metastatic to other site [facility])   Bowel perforation (HCC)   Pressure injury of skin   Vasogenic brain edema (HCC)   Normocytic anemia   Palliative care by specialist   Advanced care planning/counseling discussion   DNR (do not resuscitate)   Perforated diverticulitis Patient underwent emergent exploratory laparotomy with lysis of adhesions and sigmoid colon resection with colostomy complicated by midline wound infection.  Postop, patient was managed in ICU while intubated.  She was extubated on 10/31/2019.  Patient was managed on NG tube which was discontinued on 11/02/2019.  Diet has been advanced to regular.  She was started on IV Zosyn for her wound infection.  Repeat CT scan significant for fluid collection which should be evacuated by current drain in place per general surgery recommendations. -Wound care per general surgery  Abdominal wound infection Concern for possible Pseudomonas  infection.  No cultures obtained.  Patient has been managed on IV Zosyn with recommendations per general surgery to transition to oral antibiotics. -Unsure of organism that we are treating.  Will use General surgery's assessment and treat for Pseudomonas.  Will start ciprofloxacin 500 mg twice daily  Acute metabolic encephalopathy Secondary to vasogenic edema from metastatic lesions. Improved with steroid treatment. She was initially managed in ICU and transitioned to comfort measures before mental status drastically improved. Currently at baseline.  Vasogenic edema Secondary to metastatic lesions -Continue Decadron 8 mg BID  Metastatic lung cancer Patient follows with oncology. Started on [place] for seizure prophylaxis. Currently DNR with plans to enroll in hospice on discharge  Acute blood loss anemia In setting of above. Patient required 1 unit of PRBC. Hemoglobin is stable.  Mouth twitching Unknown etiology. Possibly facial spasms. Resolved spontaneously. Evaluated by neurology who did not think episode was related to seizure activity.  Hypothyroidism -Continue Synthroid  Pressure injury Mid-sacrum, unsure if present on admission   DVT prophylaxis: Lovenox Code Status:   Code Status: DNR Family Communication: None at bedside Disposition Plan: Discharge likely in 1-2 days home with home hospice; today was pending general surgery recommendations, PT recommendations, OT recommendations and transition to oral antibiotics. If patient continues to decline SNF, will need to discharge home   Consultants:   General surgery  Neurology  PCCM  Hematology/Oncology  Palliative care medicine  Procedures:   EXPLORATORY LAPAROTOMY; LYSIS OF ADHESIONS; ABDOMINAL WASHOUT; SIDMOID COLON RESECTION WITH COLOSTOMY (10/30/2019)  Antimicrobials:  Zosyn (7/11>>7/21)    Subjective: Feels weak/debilitated. Does not want to go home until she is stronger. Continues to decline SNF  discharge.  Objective: Vitals:   11/08/19 0354  11/08/19 1355 11/08/19 1944 11/09/19 0519  BP: (!) 142/68 (!) 171/73 134/67 (!) 148/72  Pulse: 73 86 (!) 101 76  Resp: 20 20 20 16   Temp: (!) 97.5 F (36.4 C) 98.2 F (36.8 C) (!) 97.4 F (36.3 C) 97.8 F (36.6 C)  TempSrc: Oral Oral Oral Oral  SpO2: 97% 97% 95% 96%  Weight:      Height:        Intake/Output Summary (Last 24 hours) at 11/09/2019 0948 Last data filed at 11/09/2019 0426 Gross per 24 hour  Intake 161.55 ml  Output 3000 ml  Net -2838.45 ml   Filed Weights   10/30/19 1452 10/30/19 2248 11/06/19 1538  Weight: 103 kg 100.6 kg 101.8 kg    Examination:  General exam: Appears calm and comfortable Respiratory system: Clear to auscultation. Respiratory effort normal. Cardiovascular system: S1 & S2 heard, RRR. No murmurs, rubs, gallops or clicks. Gastrointestinal system: Abdomen is nondistended, soft and nontender. Normal bowel sounds heard. Ostomy bag with brown liquid stool. Abdominal dressing is CDI Central nervous system: Alert and oriented. No focal neurological deficits. Musculoskeletal: No edema. No calf tenderness Skin: No cyanosis. No rashes. Healing lesions noted along bilateral inner thigh with no surrounding erythema Psychiatry: Judgement and insight appear normal. Mood & affect appropriate.     Data Reviewed: I have personally reviewed following labs and imaging studies  CBC Lab Results  Component Value Date   WBC 9.9 11/08/2019   RBC 3.02 (L) 11/08/2019   HGB 9.3 (L) 11/08/2019   HCT 29.1 (L) 11/08/2019   MCV 96.4 11/08/2019   MCH 30.8 11/08/2019   PLT 163 11/08/2019   MCHC 32.0 11/08/2019   RDW 15.6 (H) 11/08/2019   LYMPHSABS 1.2 11/08/2019   MONOABS 0.4 11/08/2019   EOSABS 0.0 11/08/2019   BASOSABS 0.0 03/47/4259     Last metabolic panel Lab Results  Component Value Date   NA 139 11/08/2019   K 3.2 (L) 11/08/2019   CL 103 11/08/2019   CO2 28 11/08/2019   BUN 13 11/08/2019    CREATININE 0.83 11/08/2019   GLUCOSE 156 (H) 11/08/2019   GFRNONAA >60 11/08/2019   GFRAA >60 11/08/2019   CALCIUM 8.1 (L) 11/08/2019   PHOS 2.8 11/06/2019   PROT 6.7 10/30/2019   ALBUMIN 2.2 (L) 10/30/2019   LABGLOB 2.8 08/02/2019   AGRATIO 1.5 08/02/2019   BILITOT 0.9 10/30/2019   ALKPHOS 100 10/30/2019   AST 26 10/30/2019   ALT 43 10/30/2019   ANIONGAP 8 11/08/2019    CBG (last 3)  Recent Labs    11/08/19 1706 11/08/19 2045 11/09/19 0742  GLUCAP 129* 185* 108*     GFR: Estimated Creatinine Clearance: 90.3 mL/min (by C-G formula based on SCr of 0.83 mg/dL).  Coagulation Profile: No results for input(s): INR, PROTIME in the last 168 hours.  Recent Results (from the past 240 hour(s))  SARS Coronavirus 2 by RT PCR (hospital order, performed in [facility] hospital lab) Nasopharyngeal Nasopharyngeal Swab     Status: None   Collection Time: 10/30/19  5:04 PM   Specimen: Nasopharyngeal Swab  Result Value Ref Range Status   SARS Coronavirus 2 NEGATIVE NEGATIVE Final    Comment: (NOTE) SARS-CoV-2 target nucleic acids are NOT DETECTED.  The SARS-CoV-2 RNA is generally detectable in upper and lower respiratory specimens during the acute phase of infection. The lowest concentration of SARS-CoV-2 viral copies this assay can detect is 250 copies / mL. A negative result does not preclude SARS-CoV-2  infection and should not be used as the sole basis for treatment or other patient management decisions.  A negative result may occur with improper specimen collection / handling, submission of specimen other than nasopharyngeal swab, presence of viral mutation(s) within the areas targeted by this assay, and inadequate number of viral copies (<250 copies / mL). A negative result must be combined with clinical observations, patient history, and epidemiological information.  Fact Sheet for Patients:   StrictlyIdeas.no  Fact Sheet for Healthcare  Providers: BankingDealers.co.za  This test is not yet approved or  cleared by the [place] FDA and has been authorized for detection and/or diagnosis of SARS-CoV-2 by FDA under an Emergency Use Authorization (EUA).  This EUA will remain in effect (meaning this test can be used) for the duration of the COVID-19 declaration under Section 564(b)(1) of the Act, 21 U.S.C. section 360bbb-3(b)(1), unless the authorization is terminated or revoked sooner.  Performed at [facility], [address] [place], [place] [address]   Blood culture (routine x 2)     Status: None   Collection Time: 10/30/19  5:26 PM   Specimen: BLOOD  Result Value Ref Range Status   Specimen Description BLOOD BLOOD LEFT HAND  Final   Special Requests   Final    BOTTLES DRAWN AEROBIC AND ANAEROBIC Blood Culture results may not be optimal due to an inadequate volume of blood received in culture bottles   Culture   Final    NO GROWTH 5 DAYS Performed at [facility], [address], [place], [place] [address]    Report Status 11/04/2019 FINAL  Final  Blood culture (routine x 2)     Status: None   Collection Time: 10/30/19  5:36 PM   Specimen: BLOOD  Result Value Ref Range Status   Specimen Description BLOOD BLOOD RIGHT WRIST  Final   Special Requests   Final    AEROBIC BOTTLE ONLY Blood Culture results may not be optimal due to an inadequate volume of blood received in culture bottles   Culture   Final    NO GROWTH 5 DAYS Performed at [facility], [address] [place], [place] [address]    Report Status 11/04/2019 FINAL  Final  Urine culture     Status: None   Collection Time: 10/30/19 11:18 PM   Specimen: Urine, Random  Result Value Ref Range Status   Specimen Description   Final    URINE, RANDOM Performed at [facility], [address], [place], [place] [address]    Special Requests   Final    NONE Performed at [facility],  [address] [place], [place] [address]    Culture   Final    NO GROWTH Performed at [facility], [place] [address], [place], [place] [address]    Report Status 11/01/2019 FINAL  Final  MRSA PCR Screening     Status: None   Collection Time: 10/31/19  1:42 AM   Specimen: Nasal Mucosa; Nasopharyngeal  Result Value Ref Range Status   MRSA by PCR NEGATIVE NEGATIVE Final    Comment:        The GeneXpert MRSA Assay (FDA approved for NASAL specimens only), is one component of a comprehensive MRSA colonization surveillance program. It is not intended to diagnose MRSA infection nor to guide or monitor treatment for MRSA infections. Performed at [facility], [address] [place], [place] [address]  Radiology Studies: CT ABDOMEN PELVIS W CONTRAST  Result Date: 11/08/2019 CLINICAL DATA:  64 year old female with concern for abscess or infection. Status post recent emergent exploratory laparotomy for bowel perforation. EXAM: CT ABDOMEN AND PELVIS WITH CONTRAST TECHNIQUE: Multidetector CT imaging of the abdomen and pelvis was performed using the standard protocol following bolus administration of intravenous contrast. CONTRAST:  151mL OMNIPAQUE IOHEXOL 300 MG/ML  SOLN COMPARISON:  CT abdomen pelvis dated 10/30/2019. FINDINGS: Lower chest: Minimal bibasilar atelectasis. There is a small pocket of extraluminal air in the left upper abdomen, possibly postoperative. No large pneumoperitoneum. Small free fluid noted within the pelvis. Hepatobiliary: Slight irregularity of the liver contour may represent early changes of cirrhosis. Clinical correlation is recommended. No intrahepatic biliary ductal dilatation. The gallbladder is unremarkable. Pancreas: Unremarkable. No pancreatic ductal dilatation or surrounding inflammatory changes. Spleen: Normal in size without focal abnormality. Adrenals/Urinary Tract: The adrenal glands unremarkable. There is no hydronephrosis on either  side. There is symmetric enhancement and excretion of contrast by both kidneys. The visualized ureters and urinary bladder appear unremarkable. Stomach/Bowel: There is postsurgical changes of sigmoid resection with a left lower quadrant colostomy. There is a 2 cm duodenal diverticulum without active inflammatory changes. Oral contrast noted in the small bowel. There is no evidence of bowel obstruction. The appendix is normal. There is a 5.2 x 4.2 cm fluid collection with somewhat surrounding inflammatory changes within the pelvis which may be postoperative. Developing abscess or infection is not excluded. Clinical correlation is recommended. A drainage catheter with tip within the pelvis above the bladder. Vascular/Lymphatic: Advanced aortoiliac atherosclerotic disease. The IVC is unremarkable. No portal venous gas. There is no adenopathy. Reproductive: The uterus and ovaries are grossly unremarkable. Other: Midline vertical anterior pelvic wall surgical incision with open wound and packing material. No drainable fluid collection. There is mild stranding of the subcutaneous soft tissues of the hips. Musculoskeletal: Mild degenerative changes. No acute osseous pathology. IMPRESSION: 1. Postsurgical changes of sigmoid resection with a left lower quadrant colostomy. 2. A 5.2 x 4.2 cm fluid collection with somewhat surrounding inflammatory changes within the pelvis likely postoperative. Developing abscess or infection is not excluded. Clinical correlation is recommended. 3. Small pocket of extraluminal air in the left upper abdomen, possibly postoperative. No large pneumoperitoneum. 4. No bowel obstruction. Normal appendix. 5. Aortic Atherosclerosis (ICD10-I70.0). Electronically Signed   By: Anner Crete M.D.   On: 11/08/2019 16:09        Scheduled Meds: . sodium chloride   Intravenous Once  . busPIRone  5 mg Oral Daily  . Chlorhexidine Gluconate Cloth  6 each Topical Daily  . clopidogrel  75 mg Oral  Daily  . collagenase   Topical Daily  . dexamethasone  8 mg Oral Q12H  . enoxaparin (LOVENOX) injection  40 mg Subcutaneous Q24H  . FLUoxetine  10 mg Oral Daily  . insulin aspart  0-9 Units Subcutaneous TID WC  . levETIRAcetam  500 mg Oral BID  . levothyroxine  112 mcg Oral Daily  . nystatin cream   Topical BID  . sodium chloride flush  10-40 mL Intracatheter Q12H  . sodium hypochlorite  1 application Irrigation Daily  . umeclidinium-vilanterol  1 puff Inhalation Daily   Continuous Infusions: . sodium chloride Stopped (11/07/19 1509)  . piperacillin-tazobactam (ZOSYN)  IV 3.375 g (11/09/19 0910)     LOS: 10 days     Cordelia Poche, MD [facility] 11/09/2019, 9:48 AM  If 7PM-7AM, please contact night-coverage [email or web address]

## 2019-11-09 NOTE — Progress Notes (Signed)
Physical Therapy Treatment Patient Details Name: ORTENCIA ASKARI MRN: [id number] DOB: 07/23/1955 Today's Date: 11/09/2019    History of Present Illness 64 yo female admitted with generalized weakness and diarrhea. Found to have bowel perforation s/p exploratory laparotomy requiring bowel resection along with colostomy and remained mechanically intubated postop. Patient extubated 10/31/19. History of lung cancer with brain mets, COPD, bilateral LE wounds     PT Comments    Co-tx with OT for OT eval, PT treat for improved outcomes and pt/staff safety.  To EOB with min a x 2 with cues for hand placement, sequencing and encouragement.  Once sitting, generally steady in sitting but requires supervision for safety.  She is able to attempt to stand x 2 with RW and max a x 2 but unable to stand on either attempt despite raised bed, assist and feet blocked to prevent sliding.  Care given for B feet as pt stated wounds to feet but none on soles.  RN OK'ed attempt at mobility.  She had clean dressings upon arrival but did have breakthrough bleeding after attempts.  RN in to check.  She does reports some dizziness in sitting that increased after several minutes and was assisted back to supine with mod a x 2.    While pt is motivated to participate and return home mobility is quite limited and requires significant assist.  She stated she does have some intermittent help at home but not full care and son returns to college in 1 month.  While she stated she wants to go home she is able to voice she needs more help.  If returning home is plan she decides she will need all equipment below along with mechanical lift for transfers with 24 hour care.     Patient suffers from cancer and general weakness from multiple medical interventions which impairs his/her ability to perform daily activities like toileting, feeding, dressing, grooming, bathing in the home. A cane, walker, crutch will not resolve the patient's issue  with performing activities of daily living. A lightweight wheelchair and cushion is required/recommended and will allow patient to safely perform daily activities.   Patient can safely propel the wheelchair in the home or has a caregiver who can provide assistance.    Follow Up Recommendations  SNF     Equipment Recommendations  Rolling walker with 5" wheels;3in1 (PT);Wheelchair (measurements PT);Hospital bed;Wheelchair cushion (measurements PT);Other (comment)    Recommendations for Other Services       Precautions / Restrictions Precautions Precautions: Fall Restrictions Weight Bearing Restrictions: No    Mobility  Bed Mobility Overal bed mobility: Needs Assistance Bed Mobility: Supine to Sit;Sit to Supine     Supine to sit: Min assist;+2 for physical assistance Sit to supine: Mod assist;Max assist;+2 for physical assistance   General bed mobility comments: cues needed and encouragement but good effort  Transfers Overall transfer level: Needs assistance   Transfers: Sit to/from Stand Sit to Stand: Max assist;+2 physical assistance;From elevated surface         General transfer comment: unable to fully stand.  1st attempt some hip clearance but on 2nd only shifted weight forward.  Ambulation/Gait             General Gait Details: unable   Stairs             Wheelchair Mobility    Modified Rankin (Stroke Patients Only)       Balance Overall balance assessment: Needs assistance Sitting-balance support: Feet supported Sitting balance-Leahy  Scale: Fair Sitting balance - Comments: able to sit unsupported today but supervision for safety   Standing balance support: Bilateral upper extremity supported Standing balance-Leahy Scale: Zero Standing balance comment: uanble to stand                            Cognition Arousal/Alertness: Awake/alert Behavior During Therapy: WFL for tasks assessed/performed Overall Cognitive Status:  Within Functional Limits for tasks assessed                                        Exercises      General Comments        Pertinent Vitals/Pain Pain Assessment: Faces Faces Pain Scale: Hurts a little bit Pain Location: general soreness back and feet Pain Descriptors / Indicators: Constant;Discomfort Pain Intervention(s): Limited activity within patient's tolerance;Monitored during session;Repositioned    Home Living                      Prior Function            PT Goals (current goals can now be found in the care plan section) Progress towards PT goals: Progressing toward goals    Frequency    Min 2X/week      PT Plan Current plan remains appropriate    Co-evaluation PT/OT/SLP Co-Evaluation/Treatment: Yes Reason for Co-Treatment: Complexity of the patient's impairments (multi-system involvement);For patient/therapist safety PT goals addressed during session: Other (comment)        AM-PAC PT "6 Clicks" Mobility   Outcome Measure  Help needed turning from your back to your side while in a flat bed without using bedrails?: A Lot Help needed moving from lying on your back to sitting on the side of a flat bed without using bedrails?: A Lot Help needed moving to and from a bed to a chair (including a wheelchair)?: Total Help needed standing up from a chair using your arms (e.g., wheelchair or bedside chair)?: Total Help needed to walk in hospital room?: Total Help needed climbing 3-5 steps with a railing? : Total 6 Click Score: 8    End of Session Equipment Utilized During Treatment: Oxygen;Gait belt Activity Tolerance: Patient tolerated treatment well;Patient limited by fatigue Patient left: in bed;with call bell/phone within reach;with bed alarm set Nurse Communication: Mobility status       Time: 1030-1054 PT Time Calculation (min) (ACUTE ONLY): 24 min  Charges:  $Therapeutic Activity: 23-37 mins                    Chesley Noon, PTA 11/09/19, 11:48 AM

## 2019-11-10 ENCOUNTER — Inpatient Hospital Stay: Payer: 59

## 2019-11-10 DIAGNOSIS — K631 Perforation of intestine (nontraumatic): Secondary | ICD-10-CM | POA: Diagnosis not present

## 2019-11-10 DIAGNOSIS — G936 Cerebral edema: Secondary | ICD-10-CM | POA: Diagnosis not present

## 2019-11-10 DIAGNOSIS — C3490 Malignant neoplasm of unspecified part of unspecified bronchus or lung: Secondary | ICD-10-CM | POA: Diagnosis not present

## 2019-11-10 DIAGNOSIS — D649 Anemia, unspecified: Secondary | ICD-10-CM | POA: Diagnosis not present

## 2019-11-10 LAB — GLUCOSE, CAPILLARY
Glucose-Capillary: 108 mg/dL — ABNORMAL HIGH (ref 70–99)
Glucose-Capillary: 130 mg/dL — ABNORMAL HIGH (ref 70–99)
Glucose-Capillary: 111 mg/dL — ABNORMAL HIGH (ref 70–99)
Glucose-Capillary: 168 mg/dL — ABNORMAL HIGH (ref 70–99)

## 2019-11-10 MED ORDER — GADOBUTROL 1 MMOL/ML IV SOLN
10.0000 mL | Freq: Once | INTRAVENOUS | Status: AC | PRN
  Administered 2019-11-10: 10 mL via INTRAVENOUS

## 2019-11-10 NOTE — Progress Notes (Signed)
Pt refused for this RN to change bilateral lower leg dressings. She said it hurts really bad" I educated her on the importance of letting us change them but still insisted not to touch them.

## 2019-11-10 NOTE — Progress Notes (Signed)
PROGRESS NOTE    DEBE ANFINSON  CLE:[id number] DOB: 1955/07/28 DOA: 10/30/2019 PCP: Juline Patch, MD   Brief Narrative: Kristina Wright is a 64 y.o. female with a history of hypothyroidism, stage IV lung cancer with brain metastasis, hypertension, hyperlipidemia, depression, cervical cancer.  Patient presents secondary to diarrhea and abdominal pain.  CT abdomen and pelvis was revealing for a perforated bowel secondary to perforated diverticulitis.  Patient was emergently managed in the OR and underwent exploratory laparotomy with lysis of adhesions, abdominal washout, sigmoid colon resection with colostomy.  Hospitalization was complicated by metabolic encephalopathy secondary to vasogenic edema in setting of metastatic lesions which responded to steroid therapy.  Hospitalization was also complicated by abdominal wound infection managed on IV antibiotics and wound care per general surgery.  Plan for discharge home with hospice.   Assessment & Plan:   Active Problems:   Primary malignant neoplasm of lung metastatic to other site [facility])   Bowel perforation (HCC)   Pressure injury of skin   Vasogenic brain edema (HCC)   Normocytic anemia   Palliative care by specialist   Advanced care planning/counseling discussion   DNR (do not resuscitate)   Perforated diverticulitis Patient underwent emergent exploratory laparotomy with lysis of adhesions and sigmoid colon resection with colostomy complicated by midline wound infection.  Postop, patient was managed in ICU while intubated.  She was extubated on 10/31/2019.  Patient was managed on NG tube which was discontinued on 11/02/2019.  Diet has been advanced to regular.  She was started on IV Zosyn for her wound infection.  Repeat CT scan significant for fluid collection which should be evacuated by current drain in place per general surgery recommendations. -Wound care per general surgery  Abdominal wound infection Concern for possible Pseudomonas  infection.  No cultures obtained.  Patient has been managed on IV Zosyn with recommendations per general surgery to transition to oral antibiotics. -Unsure of organism that we are treating.  Will use General surgery's assessment and treat for Pseudomonas. -Continue ciprofloxacin 500 mg twice daily  Acute metabolic encephalopathy Secondary to vasogenic edema from metastatic lesions. Improved with steroid treatment. She was initially managed in ICU and transitioned to comfort measures before mental status drastically improved. Currently at baseline.  Vasogenic edema Secondary to metastatic lesions -Continue Decadron 8 mg BID  Metastatic lung cancer Patient follows with oncology. Started on [place] for seizure prophylaxis. Currently DNR with plans to enroll in hospice on discharge  Acute blood loss anemia In setting of above. Patient required 1 unit of PRBC. Hemoglobin is stable.  Mouth twitching Unknown etiology. Possibly facial spasms. Resolved spontaneously. Evaluated by neurology who did not think episode was related to seizure activity.  Hypothyroidism -Continue Synthroid  Pressure injury Mid-sacrum, unsure if present on admission   DVT prophylaxis: Lovenox Code Status:   Code Status: Full Code Family Communication: None at bedside Disposition Plan: Discharge to SNF when bed available. Medically ready for discharge.   Consultants:   General surgery  Neurology  PCCM  Hematology/Oncology  Palliative care medicine  Procedures:   EXPLORATORY LAPAROTOMY; LYSIS OF ADHESIONS; ABDOMINAL WASHOUT; SIDMOID COLON RESECTION WITH COLOSTOMY (10/30/2019)  Antimicrobials:  Zosyn (7/11>>7/21)    Subjective: States she has changed her mind and would like full scope care in addition to treating her cancer if able  Objective: Vitals:   11/09/19 1141 11/09/19 1936 11/10/19 0251 11/10/19 0505  BP: 137/60 (!) 160/82 (!) 167/93 (!) 168/85  Pulse: 94 88 94 86  Resp: 18  20 20     Temp: 98 F (36.7 C) 98.1 F (36.7 C) 97.6 F (36.4 C)   TempSrc: Oral  Oral   SpO2: 98% 95% 96% 96%  Weight:      Height:        Intake/Output Summary (Last 24 hours) at 11/10/2019 1029 Last data filed at 11/10/2019 0514 Gross per 24 hour  Intake 199.3 ml  Output 1250 ml  Net -1050.7 ml   Filed Weights   10/30/19 1452 10/30/19 2248 11/06/19 1538  Weight: 103 kg 100.6 kg 101.8 kg    Examination:  General exam: Appears calm and comfortable Respiratory system: Clear to auscultation. Respiratory effort normal. Cardiovascular system: S1 & S2 heard, RRR. No murmurs, rubs, gallops or clicks. Gastrointestinal system: Abdomen is nondistended, soft and nontender. No organomegaly or masses felt. Normal bowel sounds heard. Drain with greyish discharge. Dressing is CDI. Ostomy bag with liquid brown stool Central nervous system: Alert and oriented. No focal neurological deficits. Musculoskeletal: No edema. No calf tenderness Skin: No cyanosis. No rashes Psychiatry: Judgement and insight appear normal. Depressed mood. Teary eyed    Data Reviewed: I have personally reviewed following labs and imaging studies  CBC Lab Results  Component Value Date   WBC 9.9 11/08/2019   RBC 3.02 (L) 11/08/2019   HGB 9.3 (L) 11/08/2019   HCT 29.1 (L) 11/08/2019   MCV 96.4 11/08/2019   MCH 30.8 11/08/2019   PLT 163 11/08/2019   MCHC 32.0 11/08/2019   RDW 15.6 (H) 11/08/2019   LYMPHSABS 1.2 11/08/2019   MONOABS 0.4 11/08/2019   EOSABS 0.0 11/08/2019   BASOSABS 0.0 63/04/6008     Last metabolic panel Lab Results  Component Value Date   NA 139 11/08/2019   K 3.2 (L) 11/08/2019   CL 103 11/08/2019   CO2 28 11/08/2019   BUN 13 11/08/2019   CREATININE 0.83 11/08/2019   GLUCOSE 156 (H) 11/08/2019   GFRNONAA >60 11/08/2019   GFRAA >60 11/08/2019   CALCIUM 8.1 (L) 11/08/2019   PHOS 2.8 11/06/2019   PROT 6.7 10/30/2019   ALBUMIN 2.2 (L) 10/30/2019   LABGLOB 2.8 08/02/2019   AGRATIO 1.5  08/02/2019   BILITOT 0.9 10/30/2019   ALKPHOS 100 10/30/2019   AST 26 10/30/2019   ALT 43 10/30/2019   ANIONGAP 8 11/08/2019    CBG (last 3)  Recent Labs    11/09/19 1705 11/09/19 2109 11/10/19 0734  GLUCAP 152* 85 108*     GFR: Estimated Creatinine Clearance: 90.3 mL/min (by C-G formula based on SCr of 0.83 mg/dL).  Coagulation Profile: No results for input(s): INR, PROTIME in the last 168 hours.  No results found for this or any previous visit (from the past 240 hour(s)).      Radiology Studies: CT ABDOMEN PELVIS W CONTRAST  Result Date: 11/08/2019 CLINICAL DATA:  64 year old female with concern for abscess or infection. Status post recent emergent exploratory laparotomy for bowel perforation. EXAM: CT ABDOMEN AND PELVIS WITH CONTRAST TECHNIQUE: Multidetector CT imaging of the abdomen and pelvis was performed using the standard protocol following bolus administration of intravenous contrast. CONTRAST:  140mL OMNIPAQUE IOHEXOL 300 MG/ML  SOLN COMPARISON:  CT abdomen pelvis dated 10/30/2019. FINDINGS: Lower chest: Minimal bibasilar atelectasis. There is a small pocket of extraluminal air in the left upper abdomen, possibly postoperative. No large pneumoperitoneum. Small free fluid noted within the pelvis. Hepatobiliary: Slight irregularity of the liver contour may represent early changes of cirrhosis. Clinical correlation is recommended. No intrahepatic  biliary ductal dilatation. The gallbladder is unremarkable. Pancreas: Unremarkable. No pancreatic ductal dilatation or surrounding inflammatory changes. Spleen: Normal in size without focal abnormality. Adrenals/Urinary Tract: The adrenal glands unremarkable. There is no hydronephrosis on either side. There is symmetric enhancement and excretion of contrast by both kidneys. The visualized ureters and urinary bladder appear unremarkable. Stomach/Bowel: There is postsurgical changes of sigmoid resection with a left lower quadrant  colostomy. There is a 2 cm duodenal diverticulum without active inflammatory changes. Oral contrast noted in the small bowel. There is no evidence of bowel obstruction. The appendix is normal. There is a 5.2 x 4.2 cm fluid collection with somewhat surrounding inflammatory changes within the pelvis which may be postoperative. Developing abscess or infection is not excluded. Clinical correlation is recommended. A drainage catheter with tip within the pelvis above the bladder. Vascular/Lymphatic: Advanced aortoiliac atherosclerotic disease. The IVC is unremarkable. No portal venous gas. There is no adenopathy. Reproductive: The uterus and ovaries are grossly unremarkable. Other: Midline vertical anterior pelvic wall surgical incision with open wound and packing material. No drainable fluid collection. There is mild stranding of the subcutaneous soft tissues of the hips. Musculoskeletal: Mild degenerative changes. No acute osseous pathology. IMPRESSION: 1. Postsurgical changes of sigmoid resection with a left lower quadrant colostomy. 2. A 5.2 x 4.2 cm fluid collection with somewhat surrounding inflammatory changes within the pelvis likely postoperative. Developing abscess or infection is not excluded. Clinical correlation is recommended. 3. Small pocket of extraluminal air in the left upper abdomen, possibly postoperative. No large pneumoperitoneum. 4. No bowel obstruction. Normal appendix. 5. Aortic Atherosclerosis (ICD10-I70.0). Electronically Signed   By: Anner Crete M.D.   On: 11/08/2019 16:09        Scheduled Meds: . sodium chloride   Intravenous Once  . busPIRone  5 mg Oral Daily  . Chlorhexidine Gluconate Cloth  6 each Topical Daily  . ciprofloxacin  500 mg Oral BID  . clopidogrel  75 mg Oral Daily  . collagenase   Topical Daily  . dexamethasone  8 mg Oral Q12H  . enoxaparin (LOVENOX) injection  40 mg Subcutaneous Q24H  . FLUoxetine  10 mg Oral Daily  . insulin aspart  0-9 Units  Subcutaneous TID WC  . levETIRAcetam  500 mg Oral BID  . levothyroxine  112 mcg Oral Daily  . nystatin cream   Topical BID  . sodium chloride flush  10-40 mL Intracatheter Q12H  . sodium hypochlorite  1 application Irrigation Daily  . umeclidinium-vilanterol  1 puff Inhalation Daily   Continuous Infusions: . sodium chloride Stopped (11/07/19 1509)     LOS: 11 days     Cordelia Poche, MD [facility] 11/10/2019, 10:29 AM  If 7PM-7AM, please contact night-coverage [email or web address]

## 2019-11-10 NOTE — TOC Progression Note (Addendum)
Transition of Care [facility]) - Progression Note    Patient Details  Name: Kristina Wright MRN: [id number] Date of Birth: [birth year]  Transition of Care [facility]) CM/SW [place], LCSW Phone Number: 11/10/2019, 10:28 AM  Clinical Narrative:  Per MD, patient now agreeable to SNF placement for rehab. CSW met with patient and confirmed. CSW explained that there are limited facilities that take [facility]. She expressed understanding but hopes to find a facility as close to home as possible. CSW offered to call and update her daughter but she declined, stating she would notify her. 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   3:47 pm: Uploaded requested documents into [place] Must for PASARR review.  Expected Discharge Plan: [place] Barriers to Discharge: Continued Medical Work up  Expected Discharge Plan and Services Expected Discharge Plan: [place] Choice: [place] arrangements for the past 2 months: Single Family Home                           HH Arranged: RN, PT, OT, Nurse's Aide, Social Work [employer] Agency: [place] ([place]) Date [place]: 11/04/19   Representative spoke with at [place]: [place] ([place]) Interventions    Readmission Risk Interventions Readmission Risk Prevention Plan 11/04/2019  PCP or Specialist Appt within 3-5 Days Complete  HRI or [employer] Complete  Social Work Consult for [place] Planning/Counseling Complete  Palliative Care Screening Not Applicable  Some recent data might be hidden

## 2019-11-10 NOTE — NC FL2 (Signed)
[place] LEVEL OF CARE SCREENING TOOL     IDENTIFICATION  Patient Name: Kristina Wright Birthdate: Nov 17, 1955 Sex: female Admission Date (Current Location): 10/30/2019  [facility] and [place] Number:  Engineering geologist and Address:  [facility], [address], [place], [place] [address]      Provider Number: [id number]  Attending Physician Name and Address:  Mariel Aloe, MD  Relative Name and Phone Number:       Current Level of Care: Hospital Recommended Level of Care: [place] (with palliative follow up) Prior Approval Number:    Date Approved/Denied:   PASRR Number: Manual review  Discharge Plan: SNF (with palliative follow up)    Current Diagnoses: Patient Active Problem List   Diagnosis Date Noted   Palliative care by specialist    Advanced care planning/counseling discussion    DNR (do not resuscitate)    Vasogenic brain edema ([place])    Normocytic anemia    Pressure injury of skin 11/02/2019   Bowel perforation ([place]) 10/30/2019   Bilateral lower extremity edema 10/07/2019   Herpes zoster without complication 03/47/4259   Port-A-Cath in place 10/01/2019   Brain metastasis ([place]) 08/05/2019   Elevated TSH 06/20/2019   Chronic obstructive pulmonary disease ([place]) 12/13/2018   Left lower lobe pulmonary nodule 11/29/2018   Breast nodule 08/25/2018   Anxiety 08/22/2018   Genetic testing 06/11/2018   Family history of breast cancer    Family history of colon cancer    Family history of ovarian cancer    Family history of stomach cancer    Family history of throat cancer    Metastatic adenocarcinoma to lung with unknown primary site ([place]) 04/27/2018   Depression 03/12/2018   Primary malignant neoplasm of lung metastatic to other site ([place]) 03/01/2018   Goals of care, counseling/discussion 03/01/2018   Tobacco use disorder 02/08/2016   Essential hypertension 02/08/2016   PVD  (peripheral vascular disease) ([place]) 02/08/2016   Blue toe syndrome of right lower extremity ([place]) 02/08/2016   Adult BMI > 30 07/31/2015   High risk medication use 07/31/2015   Carpal tunnel syndrome on both sides 10/30/2014   Postmenopause atrophic vaginitis 05/15/2014   Sensory urge incontinence 05/15/2014   Vitamin D deficiency 04/25/2014   Extremity pain 11/22/2013   Numbness 11/22/2013   Sleep disorder 11/22/2013   Chronic insomnia 11/10/2013   Foot pain, left 11/10/2013   Hypothyroidism 11/10/2013   Mixed hyperlipidemia 11/10/2013    Orientation RESPIRATION BLADDER Height & Weight     Self, Time, Situation, Place  Normal Incontinent, External catheter Weight: 224 lb 6.9 oz (101.8 kg) Height:  5\' 9"  (175.3 cm)  BEHAVIORAL SYMPTOMS/MOOD NEUROLOGICAL BOWEL NUTRITION STATUS   (None)  (None) Continent, Colostomy Diet (Regular)  AMBULATORY STATUS COMMUNICATION OF NEEDS Skin   Extensive Assist Verbally Other (Comment), PU Stage and Appropriate Care, Surgical wounds (Catheter entry/exit, MASD. Incision on abdomen (Gauze daily). Venous stasis ulcers on legs (Gauze daily).)   PU Stage 2 Dressing:  (Mid sacrum: Foam daily.)                   Personal Care Assistance Level of Assistance  Bathing, Feeding, Dressing Bathing Assistance: Maximum assistance Feeding assistance: Limited assistance Dressing Assistance: Maximum assistance     Functional Limitations Info  Sight, Hearing, Speech Sight Info: Adequate Hearing Info: Adequate Speech Info: Adequate    SPECIAL CARE FACTORS FREQUENCY  PT (By licensed PT), OT (By licensed OT)  PT Frequency: 5 x week OT Frequency: 5 x week            Contractures Contractures Info: Not present    Additional Factors Info  Code Status, Allergies, Psychotropic Code Status Info: Full code Allergies Info: NKDA Psychotropic Info: Anxiety, Depression: Buspar 5 mg PO daily, Prozac 10 mg PO daily         Current  Medications (11/10/2019):  This is the current hospital active medication list Current Facility-Administered Medications  Medication Dose Route Frequency Provider Last Rate Last Admin   0.9 %  sodium chloride infusion (Manually program via Guardrails IV Fluids)   Intravenous Once Hosie Poisson, MD       0.9 %  sodium chloride infusion   Intravenous PRN Hosie Poisson, MD   Stopped at 11/07/19 1509   acetaminophen (TYLENOL) tablet 1,000 mg  1,000 mg Oral Q6H PRN Hosie Poisson, MD   1,000 mg at 11/09/19 2115   albuterol (PROVENTIL) (2.5 MG/3ML) 0.083% nebulizer solution 2.5 mg  2.5 mg Nebulization Q6H PRN Hosie Poisson, MD       ALPRAZolam Duanne Moron) tablet 0.25 mg  0.25 mg Oral QHS PRN Lin Landsman, NP   0.25 mg at 11/09/19 2143   busPIRone (BUSPAR) tablet 5 mg  5 mg Oral Daily Hosie Poisson, MD   5 mg at 11/10/19 4403   Chlorhexidine Gluconate Cloth 2 % PADS 6 each  6 each Topical Daily Hosie Poisson, MD   6 each at 11/09/19 4742   ciprofloxacin (CIPRO) tablet 500 mg  500 mg Oral BID Mariel Aloe, MD   500 mg at 11/10/19 5956   clopidogrel (PLAVIX) tablet 75 mg  75 mg Oral Daily Hosie Poisson, MD   75 mg at 11/10/19 3875   collagenase (SANTYL) ointment   Topical Daily Hosie Poisson, MD   Given by Other at 11/09/19 0926   dexamethasone (DECADRON) tablet 8 mg  8 mg Oral Q12H Earlie Server, MD   8 mg at 11/10/19 0924   enoxaparin (LOVENOX) injection 40 mg  40 mg Subcutaneous Q24H Hosie Poisson, MD   40 mg at 11/09/19 2116   FLUoxetine (PROZAC) capsule 10 mg  10 mg Oral Daily Hosie Poisson, MD   10 mg at 11/10/19 0924   insulin aspart (novoLOG) injection 0-9 Units  0-9 Units Subcutaneous TID WC Hosie Poisson, MD   2 Units at 11/09/19 1716   ipratropium-albuterol (DUONEB) 0.5-2.5 (3) MG/3ML nebulizer solution 3 mL  3 mL Nebulization Q6H PRN Hosie Poisson, MD       labetalol (NORMODYNE) injection 10 mg  10 mg Intravenous Q2H PRN Hosie Poisson, MD   10 mg at 11/10/19 0508   levETIRAcetam  (KEPPRA) tablet 500 mg  500 mg Oral BID Hosie Poisson, MD   500 mg at 11/10/19 6433   levothyroxine (SYNTHROID) tablet 112 mcg  112 mcg Oral Daily Hosie Poisson, MD   112 mcg at 11/10/19 0500   morphine 2 MG/ML injection 1-2 mg  1-2 mg Intravenous Q2H PRN Hosie Poisson, MD   2 mg at 11/10/19 0458   nystatin cream (MYCOSTATIN)   Topical BID Hosie Poisson, MD   Given at 11/10/19 2951   polyvinyl alcohol (LIQUIFILM TEARS) 1.4 % ophthalmic solution 1 drop  1 drop Both Eyes PRN Hosie Poisson, MD   1 drop at 11/10/19 0933   sodium chloride flush (NS) 0.9 % injection 10-40 mL  10-40 mL Intracatheter Q12H Hosie Poisson, MD   10 mL at 11/09/19 [phone]  sodium chloride flush (NS) 0.9 % injection 10-40 mL  10-40 mL Intracatheter PRN Hosie Poisson, MD       sodium hypochlorite (DAKIN'S 1/4 STRENGTH) topical solution 1 application  1 application Irrigation Daily Sakai, Isami, DO   1 application at 48/40/39 0917   umeclidinium-vilanterol (ANORO ELLIPTA) 62.5-25 MCG/INH 1 puff  1 puff Inhalation Daily Hosie Poisson, MD   1 puff at 11/10/19 7953     Discharge Medications: Please see discharge summary for a list of discharge medications.  Relevant Imaging Results:  Relevant Lab Results:   Additional Information SS#: [id number]  Candie Chroman, LCSW

## 2019-11-11 DIAGNOSIS — D649 Anemia, unspecified: Secondary | ICD-10-CM | POA: Diagnosis not present

## 2019-11-11 LAB — GLUCOSE, CAPILLARY
Glucose-Capillary: 105 mg/dL — ABNORMAL HIGH (ref 70–99)
Glucose-Capillary: 128 mg/dL — ABNORMAL HIGH (ref 70–99)
Glucose-Capillary: 118 mg/dL — ABNORMAL HIGH (ref 70–99)
Glucose-Capillary: 215 mg/dL — ABNORMAL HIGH (ref 70–99)

## 2019-11-11 MED ORDER — DEXAMETHASONE 4 MG PO TABS
4.0000 mg | ORAL_TABLET | Freq: Three times a day (TID) | ORAL | Status: DC
  Administered 2019-11-11 – 2019-11-21 (×28): 4 mg via ORAL
  Filled 2019-11-11 (×30): qty 1

## 2019-11-11 MED ORDER — COLLAGENASE 250 UNIT/GM EX OINT
TOPICAL_OINTMENT | Freq: Every day | CUTANEOUS | Status: DC
  Filled 2019-11-11: qty 30

## 2019-11-11 MED ORDER — FAMOTIDINE 20 MG PO TABS
20.0000 mg | ORAL_TABLET | Freq: Every day | ORAL | Status: DC
  Administered 2019-11-11 – 2019-11-21 (×11): 20 mg via ORAL
  Filled 2019-11-11 (×11): qty 1

## 2019-11-11 MED ORDER — OSIMERTINIB MESYLATE 80 MG PO TABS
80.0000 mg | ORAL_TABLET | Freq: Every day | ORAL | Status: DC
  Administered 2019-11-11 – 2019-11-20 (×10): 80 mg via ORAL
  Filled 2019-11-11 (×12): qty 1

## 2019-11-11 NOTE — Progress Notes (Addendum)
Hematology/Oncology Progress Note [facility] Telephone:([phone] [phone]  Patient Care Team: Juline Patch, MD as PCP - General (Family Medicine) Lequita Asal, MD as Referring Physician (Hematology and Oncology) Delana Meyer, Dolores Lory, MD (Vascular Surgery) Noreene Filbert, MD as Radiation Oncologist (Radiation Oncology)   Name of the patient: Kristina Wright  [id number]  Oct 28, 1955  Date of visit: 11/11/19   INTERVAL HISTORY-  Patient reports feeling well. No new complaints.  Mental status is at her baseline. Patient has changed her mind and does not want hospice.  She desires treatment .  She denies any nausea vomiting headache or vision changes.  Appetite is fair.   Review of systems- Review of Systems  Constitutional: Positive for fatigue. Negative for appetite change.  Respiratory: Negative for shortness of breath.   Gastrointestinal: Negative for abdominal pain.  Genitourinary: Negative for dysuria.   Musculoskeletal: Negative for back pain.  Skin: Negative for rash.  Neurological: Negative for light-headedness and seizures.  Hematological: Negative for adenopathy.  Psychiatric/Behavioral: Negative for confusion.    No Known Allergies  Patient Active Problem List   Diagnosis Date Noted  . Palliative care by specialist   . Advanced care planning/counseling discussion   . DNR (do not resuscitate)   . Vasogenic brain edema ([place])   . Normocytic anemia   . Pressure injury of skin 11/02/2019  . Bowel perforation ([place]) 10/30/2019  . Bilateral lower extremity edema 10/07/2019  . Herpes zoster without complication 32/67/1245  . Port-A-Cath in place 10/01/2019  . Brain metastasis ([place]) 08/05/2019  . Elevated TSH 06/20/2019  . Chronic obstructive pulmonary disease ([place]) 12/13/2018  . Left lower lobe pulmonary nodule 11/29/2018  . Breast nodule 08/25/2018  . Anxiety 08/22/2018  . Genetic testing 06/11/2018  . Family history of  breast cancer   . Family history of colon cancer   . Family history of ovarian cancer   . Family history of stomach cancer   . Family history of throat cancer   . Metastatic adenocarcinoma to lung with unknown primary site [facility]) 04/27/2018  . Depression 03/12/2018  . Primary malignant neoplasm of lung metastatic to other site ([place]) 03/01/2018  . Goals of care, counseling/discussion 03/01/2018  . Tobacco use disorder 02/08/2016  . Essential hypertension 02/08/2016  . PVD (peripheral vascular disease) ([place]) 02/08/2016  . Blue toe syndrome of right lower extremity ([place]) 02/08/2016  . Adult BMI > 30 07/31/2015  . High risk medication use 07/31/2015  . Carpal tunnel syndrome on both sides 10/30/2014  . Postmenopause atrophic vaginitis 05/15/2014  . Sensory urge incontinence 05/15/2014  . Vitamin D deficiency 04/25/2014  . Extremity pain 11/22/2013  . Numbness 11/22/2013  . Sleep disorder 11/22/2013  . Chronic insomnia 11/10/2013  . Foot pain, left 11/10/2013  . Hypothyroidism 11/10/2013  . Mixed hyperlipidemia 11/10/2013     Past Medical History:  Diagnosis Date  . Cancer ([place]) 1989   cervical  . Depression   . Family history of breast cancer   . Family history of colon cancer   . Family history of ovarian cancer   . Family history of stomach cancer   . Family history of throat cancer   . Hyperlipidemia   . Hypertension   . Malignant neoplasm of lung ([place]) 03/01/2018  . Thyroid disease      Past Surgical History:  Procedure Laterality Date  . BILATERAL CARPAL TUNNEL RELEASE    . BOWEL RESECTION N/A 10/30/2019   Procedure: SMALL BOWEL RESECTION;  Surgeon: Benjamine Sprague, DO;  Location: [facility] ORS;  Service: General;  Laterality: N/A;  . BREAST CYST ASPIRATION Left   . LAPAROTOMY N/A 10/30/2019   Procedure: EXPLORATORY LAPAROTOMY;  Surgeon: Benjamine Sprague, DO;  Location: [facility] ORS;  Service: General;  Laterality: N/A;  . LUNG BIOPSY    . PARTIAL HYSTERECTOMY    . PORTA CATH  INSERTION N/A 03/03/2018   Procedure: PORTA CATH INSERTION;  Surgeon: Katha Cabal, MD;  Location: [place] [facility];  Service: Cardiovascular;  Laterality: N/A;  . TUBAL LIGATION      Social History   Socioeconomic History  . Marital status: Single    Spouse name: Not on file  . Number of children: 3  . Years of education: Not on file  . Highest education level: Not on file  Occupational History  . Not on file  Tobacco Use  . Smoking status: Former Smoker    Packs/day: 1.00    Years: 25.00    Pack years: 25.00    Types: Cigarettes    Quit date: 11/19/2017    Years since quitting: 1.9  . Smokeless tobacco: Never Used  Vaping Use  . Vaping Use: Never used  Substance and Sexual Activity  . Alcohol use: No  . Drug use: Yes    Types: Marijuana    Comment: occasional  . Sexual activity: Not on file  Other Topics Concern  . Not on file  Social History Narrative  . Not on file   Social Determinants of Health   Financial Resource Strain:   . Difficulty of Paying Living Expenses:   Food Insecurity:   . Worried About Charity fundraiser in the Last Year:   . Arboriculturist in the Last Year:   Transportation Needs:   . Film/video editor (Medical):   Marland Kitchen Lack of Transportation (Non-Medical):   Physical Activity:   . Days of Exercise per Week:   . Minutes of Exercise per Session:   Stress:   . Feeling of Stress :   Social Connections:   . Frequency of Communication with Friends and Family:   . Frequency of Social Gatherings with Friends and Family:   . Attends Religious Services:   . Active Member of Clubs or Organizations:   . Attends Archivist Meetings:   Marland Kitchen Marital Status:   Intimate Partner Violence:   . Fear of Current or Ex-Partner:   . Emotionally Abused:   Marland Kitchen Physically Abused:   . Sexually Abused:      Family History  Problem Relation Age of Onset  . Colon cancer Mother 46  . Colon cancer Maternal Grandmother 78  . Hypertension  Father   . Breast cancer Sister 29  . Throat cancer Brother   . Heart attack Maternal Aunt   . Ovarian cancer Maternal Aunt 70  . Stomach cancer Paternal Grandmother        dx >50  . Throat cancer Brother      Current Facility-Administered Medications:  .  0.9 %  sodium chloride infusion (Manually program via Guardrails IV Fluids), , Intravenous, Once, Hosie Poisson, MD .  0.9 %  sodium chloride infusion, , Intravenous, PRN, Hosie Poisson, MD, Stopped at 11/07/19 1509 .  acetaminophen (TYLENOL) tablet 1,000 mg, 1,000 mg, Oral, Q6H PRN, Hosie Poisson, MD, 1,000 mg at 11/09/19 2115 .  albuterol (PROVENTIL) (2.5 MG/3ML) 0.083% nebulizer solution 2.5 mg, 2.5 mg, Nebulization, Q6H PRN, Hosie Poisson, MD .  ALPRAZolam Duanne Moron)  tablet 0.25 mg, 0.25 mg, Oral, QHS PRN, Lin Landsman, NP, 0.25 mg at 11/09/19 2143 .  busPIRone (BUSPAR) tablet 5 mg, 5 mg, Oral, Daily, Hosie Poisson, MD, 5 mg at 11/11/19 1041 .  Chlorhexidine Gluconate Cloth 2 % PADS 6 each, 6 each, Topical, Daily, Hosie Poisson, MD, 6 each at 11/09/19 0924 .  ciprofloxacin (CIPRO) tablet 500 mg, 500 mg, Oral, BID, Mariel Aloe, MD, 500 mg at 11/11/19 1041 .  clopidogrel (PLAVIX) tablet 75 mg, 75 mg, Oral, Daily, Hosie Poisson, MD, 75 mg at 11/11/19 1041 .  collagenase (SANTYL) ointment, , Topical, Daily, Hosie Poisson, MD, Given at 11/11/19 1043 .  collagenase (SANTYL) ointment, , Topical, Daily, Sakai, Isami, DO, Given at 11/11/19 1043 .  dexamethasone (DECADRON) tablet 8 mg, 8 mg, Oral, Q12H, Earlie Server, MD, 8 mg at 11/11/19 1040 .  enoxaparin (LOVENOX) injection 40 mg, 40 mg, Subcutaneous, Q24H, Hosie Poisson, MD, 40 mg at 11/10/19 2001 .  FLUoxetine (PROZAC) capsule 10 mg, 10 mg, Oral, Daily, Hosie Poisson, MD, 10 mg at 11/11/19 1041 .  insulin aspart (novoLOG) injection 0-9 Units, 0-9 Units, Subcutaneous, TID WC, Hosie Poisson, MD, 1 Units at 11/10/19 1206 .  ipratropium-albuterol (DUONEB) 0.5-2.5 (3) MG/3ML nebulizer solution 3  mL, 3 mL, Nebulization, Q6H PRN, Hosie Poisson, MD .  labetalol (NORMODYNE) injection 10 mg, 10 mg, Intravenous, Q2H PRN, Hosie Poisson, MD, 10 mg at 11/10/19 0508 .  levETIRAcetam (KEPPRA) tablet 500 mg, 500 mg, Oral, BID, Hosie Poisson, MD, 500 mg at 11/10/19 2000 .  levothyroxine (SYNTHROID) tablet 112 mcg, 112 mcg, Oral, Daily, Hosie Poisson, MD, 112 mcg at 11/11/19 0529 .  morphine 2 MG/ML injection 1-2 mg, 1-2 mg, Intravenous, Q2H PRN, Hosie Poisson, MD, 2 mg at 11/11/19 1043 .  nystatin cream (MYCOSTATIN), , Topical, BID, Hosie Poisson, MD, Given at 11/11/19 1044 .  polyvinyl alcohol (LIQUIFILM TEARS) 1.4 % ophthalmic solution 1 drop, 1 drop, Both Eyes, PRN, Hosie Poisson, MD, 1 drop at 11/11/19 0543 .  sodium chloride flush (NS) 0.9 % injection 10-40 mL, 10-40 mL, Intracatheter, Q12H, Hosie Poisson, MD, 10 mL at 11/11/19 1044 .  sodium chloride flush (NS) 0.9 % injection 10-40 mL, 10-40 mL, Intracatheter, PRN, Hosie Poisson, MD .  umeclidinium-vilanterol (ANORO ELLIPTA) 62.5-25 MCG/INH 1 puff, 1 puff, Inhalation, Daily, Hosie Poisson, MD, 1 puff at 11/11/19 1045   Physical exam:  Vitals:   11/10/19 1142 11/10/19 1953 11/11/19 0436 11/11/19 1155  BP: (!) 158/80 (!) 157/71 (!) 157/79 (!) 155/83  Pulse: 92 95 91 93  Resp:  16 16   Temp: 97.9 F (36.6 C) 98.7 F (37.1 C) 98.3 F (36.8 C) 98.1 F (36.7 C)  TempSrc: Oral Oral  Oral  SpO2: 96% 96% 96% 96%  Weight:      Height:       Physical Exam HENT:     Head: Normocephalic and atraumatic.  Eyes:     General: No scleral icterus.    Pupils: Pupils are equal, round, and reactive to light.  Cardiovascular:     Rate and Rhythm: Normal rate.  Pulmonary:     Effort: Pulmonary effort is normal. No respiratory distress.  Abdominal:     General: There is no distension.     Palpations: Abdomen is soft.     Comments: Midline wound covered with dressing  Musculoskeletal:        General: Normal range of motion.  Skin:    General:  Skin is warm and  dry.  Neurological:     Mental Status: She is alert and oriented to person, place, and time. Mental status is at baseline.  Psychiatric:        Mood and Affect: Mood normal.        CMP Latest Ref Rng & Units 11/08/2019  Glucose 70 - 99 mg/dL 156(H)  BUN 8 - 23 mg/dL 13  Creatinine 0.44 - 1.00 mg/dL 0.83  Sodium 135 - 145 mmol/L 139  Potassium 3.5 - 5.1 mmol/L 3.2(L)  Chloride 98 - 111 mmol/L 103  CO2 22 - 32 mmol/L 28  Calcium 8.9 - 10.3 mg/dL 8.1(L)  Total Protein 6.5 - 8.1 g/dL -  Total Bilirubin 0.3 - 1.2 mg/dL -  Alkaline Phos 38 - 126 U/L -  AST 15 - 41 U/L -  ALT 0 - 44 U/L -   CBC Latest Ref Rng & Units 11/08/2019  WBC 4.0 - 10.5 K/uL 9.9  Hemoglobin 12.0 - 15.0 g/dL 9.3(L)  Hematocrit 36 - 46 % 29.1(L)  Platelets 150 - 400 K/uL 163    RADIOGRAPHIC STUDIES: I have personally reviewed the radiological images as listed and agreed with the findings in the report. DG Abd 1 View  Result Date: 11/04/2019 CLINICAL DATA:  Ileus. EXAM: ABDOMEN - 1 VIEW COMPARISON:  October 30, 2019. FINDINGS: The bowel gas pattern is normal. Ostomy is noted in left lower quadrant. Surgical drain is noted in the pelvis. IMPRESSION: No evidence of bowel obstruction or ileus. Electronically Signed   By: Marijo Conception M.D.   On: 11/04/2019 13:07   DG Abd 1 View  Result Date: 10/30/2019 CLINICAL DATA:  64 year old female with endotracheal and enteric tube placement. EXAM: PORTABLE CHEST AND ABDOMEN 1 VIEW COMPARISON:  CT abdomen pelvis dated 10/30/2019 and chest CT dated 08/09/2019. FINDINGS: An endotracheal tube is seen with tip approximately 3.5 cm above the carina. Enteric tube extends below the diaphragm with tip and side-port in the left upper abdomen likely in the body of the stomach. Right-sided Port-A-Cath with tip in the region of the cavoatrial junction. There is mild central vascular prominence. Diffuse bilateral interstitial prominence as well as streaky densities in the  left lower lung field, new or progressed since the prior CT. Findings may represent combination of vascular congestion/edema and/or pneumonia. A small left pleural effusion may be present. No pneumothorax. The cardiac silhouette is within normal limits. Atherosclerotic calcification of the aorta. Air is noted within the colon. No bowel dilatation. A drainage catheter noted over the pelvis. Midline anterior pelvic wall cutaneous staples. No acute osseous pathology. IMPRESSION: 1. Endotracheal tube above the carina. Enteric tube with tip and side-port in the body of the stomach. 2. Probable mild vascular congestion or edema. Pneumonia is not excluded. Clinical correlation is recommended. Electronically Signed   By: Anner Crete M.D.   On: 10/30/2019 23:47   CT HEAD WO CONTRAST  Result Date: 11/06/2019 CLINICAL DATA:  Lung cancer with brain metastasis. Somnolence today. EXAM: CT HEAD WITHOUT CONTRAST TECHNIQUE: Contiguous axial images were obtained from the base of the skull through the vertex without intravenous contrast. COMPARISON:  MRI head with contrast 09/29/2019 FINDINGS: Brain: Extensive vasogenic edema in the right frontal lobe. Enhancing mass lesion is seen in the right frontal lobe on the prior MRI. 9 mm midline shift to the left due to mass-effect is similar to the prior MRI. Negative for acute hemorrhage. Chronic ischemia in the left internal capsule unchanged. Vascular: Negative for hyperdense vessel Skull:  No skull lesion identified Sinuses/Orbits: Negative Other: None IMPRESSION: Right frontal metastasis with extensive surrounding vasogenic edema. 9 mm midline shift to the left. Findings similar to the prior MRI. No new acute findings. Electronically Signed   By: Franchot Gallo M.D.   On: 11/06/2019 11:49   MR BRAIN W WO CONTRAST  Result Date: 11/10/2019 CLINICAL DATA:  Intracranial metastatic disease. EXAM: MRI HEAD WITHOUT AND WITH CONTRAST TECHNIQUE: Multiplanar, multiecho pulse sequences  of the brain and surrounding structures were obtained without and with intravenous contrast. CONTRAST:  76mL GADAVIST GADOBUTROL 1 MMOL/ML IV SOLN COMPARISON:  Head CT 11/06/2019 and brain MRI 09/29/2019 FINDINGS: Brain: No acute infarct, acute hemorrhage or extra-axial collection. Contrast-enhancing mass in the right frontal lobe measures 4.2 x 2.6 x 3.7 cm. Previously the lesion measured 3.4 x 2.1 x 3.4 cm. Surrounding vasogenic edema is unchanged. There is leftward midline shift that measures 5 mm, unchanged. Normal volume of CSF spaces. No chronic microhemorrhage. There are no new contrast-enhancing lesions Vascular: Normal flow voids. Skull and upper cervical spine: Normal marrow signal. Sinuses/Orbits: Small amount of right mastoid fluid. Paranasal sinuses are clear. Other: None. IMPRESSION: 1. Increased size of right frontal lobe lesion with unchanged surrounding vasogenic edema and 5 mm leftward midline shift. 2. No new lesion. Electronically Signed   By: Ulyses Jarred M.D.   On: 11/10/2019 23:44   CT ABDOMEN PELVIS W CONTRAST  Result Date: 11/08/2019 CLINICAL DATA:  64 year old female with concern for abscess or infection. Status post recent emergent exploratory laparotomy for bowel perforation. EXAM: CT ABDOMEN AND PELVIS WITH CONTRAST TECHNIQUE: Multidetector CT imaging of the abdomen and pelvis was performed using the standard protocol following bolus administration of intravenous contrast. CONTRAST:  128mL OMNIPAQUE IOHEXOL 300 MG/ML  SOLN COMPARISON:  CT abdomen pelvis dated 10/30/2019. FINDINGS: Lower chest: Minimal bibasilar atelectasis. There is a small pocket of extraluminal air in the left upper abdomen, possibly postoperative. No large pneumoperitoneum. Small free fluid noted within the pelvis. Hepatobiliary: Slight irregularity of the liver contour may represent early changes of cirrhosis. Clinical correlation is recommended. No intrahepatic biliary ductal dilatation. The gallbladder is  unremarkable. Pancreas: Unremarkable. No pancreatic ductal dilatation or surrounding inflammatory changes. Spleen: Normal in size without focal abnormality. Adrenals/Urinary Tract: The adrenal glands unremarkable. There is no hydronephrosis on either side. There is symmetric enhancement and excretion of contrast by both kidneys. The visualized ureters and urinary bladder appear unremarkable. Stomach/Bowel: There is postsurgical changes of sigmoid resection with a left lower quadrant colostomy. There is a 2 cm duodenal diverticulum without active inflammatory changes. Oral contrast noted in the small bowel. There is no evidence of bowel obstruction. The appendix is normal. There is a 5.2 x 4.2 cm fluid collection with somewhat surrounding inflammatory changes within the pelvis which may be postoperative. Developing abscess or infection is not excluded. Clinical correlation is recommended. A drainage catheter with tip within the pelvis above the bladder. Vascular/Lymphatic: Advanced aortoiliac atherosclerotic disease. The IVC is unremarkable. No portal venous gas. There is no adenopathy. Reproductive: The uterus and ovaries are grossly unremarkable. Other: Midline vertical anterior pelvic wall surgical incision with open wound and packing material. No drainable fluid collection. There is mild stranding of the subcutaneous soft tissues of the hips. Musculoskeletal: Mild degenerative changes. No acute osseous pathology. IMPRESSION: 1. Postsurgical changes of sigmoid resection with a left lower quadrant colostomy. 2. A 5.2 x 4.2 cm fluid collection with somewhat surrounding inflammatory changes within the pelvis likely postoperative. Developing abscess or  infection is not excluded. Clinical correlation is recommended. 3. Small pocket of extraluminal air in the left upper abdomen, possibly postoperative. No large pneumoperitoneum. 4. No bowel obstruction. Normal appendix. 5. Aortic Atherosclerosis (ICD10-I70.0).  Electronically Signed   By: Anner Crete M.D.   On: 11/08/2019 16:09   CT ABDOMEN PELVIS W CONTRAST  Result Date: 10/30/2019 CLINICAL DATA:  64 year old female with diffuse abdominal and pelvic pain. History of metastatic lung cancer. EXAM: CT ABDOMEN AND PELVIS WITH CONTRAST TECHNIQUE: Multidetector CT imaging of the abdomen and pelvis was performed using the standard protocol following bolus administration of intravenous contrast. CONTRAST:  64mL OMNIPAQUE IOHEXOL 300 MG/ML  SOLN COMPARISON:  08/09/2019 FINDINGS: Lower chest: No acute abnormality. Hepatobiliary: The liver and gallbladder are unremarkable. No biliary dilatation. Pancreas: Unremarkable Spleen: Unremarkable Adrenals/Urinary Tract: The kidneys, adrenal glands and bladder are unremarkable. Stomach/Bowel: A small to moderate amount of pneumoperitoneum is present within the abdomen and pelvis, and centered adjacent to the sigmoid colon, which exhibits mild wall thickening. Colonic diverticulosis is noted. No other bowel abnormalities are identified. The appendix is unremarkable. Vascular/Lymphatic: Aortic atherosclerosis. No enlarged abdominal or pelvic lymph nodes. Reproductive: Uterus and bilateral adnexa are unremarkable. Other: A small amount free fluid in the pelvis is noted. No discrete abscess is present. Musculoskeletal: No acute or suspicious bony abnormalities are noted. IMPRESSION: 1. Bowel perforation with small to moderate amount of pneumoperitoneum within the abdomen and pelvis. Pneumoperitoneum is centered adjacent to mildly thickened sigmoid colon, most likely representing perforation of the sigmoid colon, of uncertain etiology. Small amount of free fluid in the pelvis. No discrete abscess. 2. Aortic Atherosclerosis (ICD10-I70.0). Critical Value/emergent results were called by telephone at the time of interpretation on 10/30/2019 at 4:40 pm to provider Laban Emperor, who verbally acknowledged these results. Electronically Signed    By: Margarette Canada M.D.   On: 10/30/2019 16:42   DG Chest Port 1 View  Result Date: 10/30/2019 CLINICAL DATA:  64 year old female with endotracheal and enteric tube placement. EXAM: PORTABLE CHEST AND ABDOMEN 1 VIEW COMPARISON:  CT abdomen pelvis dated 10/30/2019 and chest CT dated 08/09/2019. FINDINGS: An endotracheal tube is seen with tip approximately 3.5 cm above the carina. Enteric tube extends below the diaphragm with tip and side-port in the left upper abdomen likely in the body of the stomach. Right-sided Port-A-Cath with tip in the region of the cavoatrial junction. There is mild central vascular prominence. Diffuse bilateral interstitial prominence as well as streaky densities in the left lower lung field, new or progressed since the prior CT. Findings may represent combination of vascular congestion/edema and/or pneumonia. A small left pleural effusion may be present. No pneumothorax. The cardiac silhouette is within normal limits. Atherosclerotic calcification of the aorta. Air is noted within the colon. No bowel dilatation. A drainage catheter noted over the pelvis. Midline anterior pelvic wall cutaneous staples. No acute osseous pathology. IMPRESSION: 1. Endotracheal tube above the carina. Enteric tube with tip and side-port in the body of the stomach. 2. Probable mild vascular congestion or edema. Pneumonia is not excluded. Clinical correlation is recommended. Electronically Signed   By: Anner Crete M.D.   On: 10/30/2019 23:47    Assessment and plan-  Patient is a 64 y.o. 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  #Decreased mental status, has resolved.  Mental  status is at her baseline. 11/06/2019, CT head without contrast showed extensive  vasogenic edema in the right frontal lobe, ALC mass lesion is seen in the right frontal lobe.  9 mm midline shift into the left due to mass-effect similar to prior MRI. 11/10/2019, brain MRI images were independently reviewed by me and discussed with patient.  I also discussed with radiation oncology Dr. Baruch Gouty.  There is increase size of right frontal lobe lesion with unchanged surrounding vasogenic edema and a 5 mm leftward midline shift.  No new lesion. It is possible that these are radiation necrosis changes, although cannot exclude recurrence. Continue dexamethasone.  She is currently on 8 mg twice daily.  Mental status has improved.  I will decrease to 4 mg TID.  Plan to gradually taper over the course of several weeks. add GI prophylaxis- pepcid.  Keppra for seizure prophylaxis. Glycemic control.  Aspiration precaution.  Marland Kitchen  #Perforated diverticulitis status post sigmoid resection with midline wound infection, now on oral antibiotics.  Managed by surgery.   #Stage IV lung cancer with brain metastasis status post radiation, Previously on osimertinib with her extracranial lung cancer disease well controlled. Osimertinib may affect wound healing.  Discussed with surgery Dr. Lysle Pearl who is okay for patient to resume osimertinib.  I asked patient to bring her own supply to the hospital and start-80 mg daily.  #Goals of care, she is now full code.  patient clarified that she does not want hospice at this point.  She wanted proceed with some form of treatments.  I discussed options with her about resuming osimertinib, treat brain edema, observe for brain edema with periodic images, we also discussed about other options including IV chemotherapy and patient is not interested.  Thank you for allowing me to participate in the care of this patient.  I will follow up during her inpatient course.  Patient will follow up with me outpatient for further discussions.plan was discussed with Dr.Nettey  Earlie Server,  MD, PhD Hematology Oncology [facility] Pager- [phone] 11/11/2019

## 2019-11-11 NOTE — Progress Notes (Signed)
PROGRESS NOTE    Kristina Wright  VPX:[id number] DOB: Nov 24, 1955 DOA: 10/30/2019 PCP: Juline Patch, MD   Brief Narrative: Kristina Wright is a 64 y.o. female with a history of hypothyroidism, stage IV lung cancer with brain metastasis, hypertension, hyperlipidemia, depression, cervical cancer.  Patient presents secondary to diarrhea and abdominal pain.  CT abdomen and pelvis was revealing for a perforated bowel secondary to perforated diverticulitis.  Patient was emergently managed in the OR and underwent exploratory laparotomy with lysis of adhesions, abdominal washout, sigmoid colon resection with colostomy.  Hospitalization was complicated by metabolic encephalopathy secondary to vasogenic edema in setting of metastatic lesions which responded to steroid therapy.  Hospitalization was also complicated by abdominal wound infection managed on IV antibiotics and wound care per general surgery.  Plan for discharge home with hospice.   Assessment & Plan:   Active Problems:   Primary malignant neoplasm of lung metastatic to other site [facility])   Bowel perforation (HCC)   Pressure injury of skin   Vasogenic brain edema (HCC)   Normocytic anemia   Palliative care by specialist   Advanced care planning/counseling discussion   DNR (do not resuscitate)   Perforated diverticulitis Patient underwent emergent exploratory laparotomy with lysis of adhesions and sigmoid colon resection with colostomy complicated by midline wound infection.  Postop, patient was managed in ICU while intubated.  She was extubated on 10/31/2019.  Patient was managed on NG tube which was discontinued on 11/02/2019.  Diet has been advanced to regular.  She was started on IV Zosyn for her wound infection.  Repeat CT scan significant for fluid collection which should be evacuated by current drain in place per general surgery recommendations. -Wound care per general surgery  Abdominal wound infection Concern for possible Pseudomonas  infection.  No cultures obtained.  Patient has been managed on IV Zosyn with recommendations per general surgery to transition to oral antibiotics. Unsure of organism that we are treating; will use General surgery's assessment and treat for Pseudomonas. -Continue ciprofloxacin 500 mg twice daily  Acute metabolic encephalopathy Secondary to vasogenic edema from metastatic lesions. Improved with steroid treatment. She was initially managed in ICU and transitioned to comfort measures before mental status drastically improved. Currently at baseline.  Vasogenic edema Secondary to metastatic lesions -Continue Decadron 8 mg BID  Stage IV lung cancer with metastasis to brain Patient follows with oncology. Started on [place] for seizure prophylaxis. Previously DNR and now full code. Patient is wanting full scope care and treatment options for cancer. Medical oncology consulted and has recommended restarting osimertinib.   Acute blood loss anemia In setting of above. Patient required 1 unit of PRBC. Hemoglobin is stable.  Mouth twitching Unknown etiology. Possibly facial spasms. Resolved spontaneously. Evaluated by neurology who did not think episode was related to seizure activity.  Hypothyroidism -Continue Synthroid  Pressure injury Mid-sacrum, unsure if present on admission   DVT prophylaxis: Lovenox Code Status:   Code Status: Full Code Family Communication: None at bedside Disposition Plan: Discharge to SNF when bed available. Medically ready for discharge.   Consultants:   General surgery  Neurology  PCCM  Hematology/Oncology  Palliative care medicine  Procedures:   EXPLORATORY LAPAROTOMY; LYSIS OF ADHESIONS; ABDOMINAL WASHOUT; SIDMOID COLON RESECTION WITH COLOSTOMY (10/30/2019)  Antimicrobials:  Zosyn (7/11>>7/21)    Subjective: Some pain today. Querying about management of her ostomy. Otherwise no issues.  Objective: Vitals:   11/10/19 1142 11/10/19 1953 11/11/19  0436 11/11/19 1155  BP: (!) 158/80 Marland Kitchen)  157/71 (!) 157/79 (!) 155/83  Pulse: 92 95 91 93  Resp:  16 16   Temp: 97.9 F (36.6 C) 98.7 F (37.1 C) 98.3 F (36.8 C) 98.1 F (36.7 C)  TempSrc: Oral Oral  Oral  SpO2: 96% 96% 96% 96%  Weight:      Height:        Intake/Output Summary (Last 24 hours) at 11/11/2019 1312 Last data filed at 11/11/2019 0930 Gross per 24 hour  Intake 480 ml  Output 1125 ml  Net -645 ml   Filed Weights   10/30/19 1452 10/30/19 2248 11/06/19 1538  Weight: 103 kg 100.6 kg 101.8 kg    Examination:  General exam: Appears calm and comfortable Respiratory system: Clear to auscultation. Respiratory effort normal. Cardiovascular system: S1 & S2 heard, RRR. No murmurs, rubs, gallops or clicks. Gastrointestinal system: Abdomen is soft. No organomegaly or masses felt. Normal bowel sounds heard. Central nervous system: Alert and oriented. No focal neurological deficits. Musculoskeletal: No calf tenderness Skin: No cyanosis. No rashes Psychiatry: Judgement and insight appear normal. Blunt affect    Data Reviewed: I have personally reviewed following labs and imaging studies  CBC Lab Results  Component Value Date   WBC 9.9 11/08/2019   RBC 3.02 (L) 11/08/2019   HGB 9.3 (L) 11/08/2019   HCT 29.1 (L) 11/08/2019   MCV 96.4 11/08/2019   MCH 30.8 11/08/2019   PLT 163 11/08/2019   MCHC 32.0 11/08/2019   RDW 15.6 (H) 11/08/2019   LYMPHSABS 1.2 11/08/2019   MONOABS 0.4 11/08/2019   EOSABS 0.0 11/08/2019   BASOSABS 0.0 00/86/7619     Last metabolic panel Lab Results  Component Value Date   NA 139 11/08/2019   K 3.2 (L) 11/08/2019   CL 103 11/08/2019   CO2 28 11/08/2019   BUN 13 11/08/2019   CREATININE 0.83 11/08/2019   GLUCOSE 156 (H) 11/08/2019   GFRNONAA >60 11/08/2019   GFRAA >60 11/08/2019   CALCIUM 8.1 (L) 11/08/2019   PHOS 2.8 11/06/2019   PROT 6.7 10/30/2019   ALBUMIN 2.2 (L) 10/30/2019   LABGLOB 2.8 08/02/2019   AGRATIO 1.5 08/02/2019    BILITOT 0.9 10/30/2019   ALKPHOS 100 10/30/2019   AST 26 10/30/2019   ALT 43 10/30/2019   ANIONGAP 8 11/08/2019    CBG (last 3)  Recent Labs    11/10/19 2116 11/11/19 0726 11/11/19 1153  GLUCAP 168* 105* 128*     GFR: Estimated Creatinine Clearance: 90.3 mL/min (by C-G formula based on SCr of 0.83 mg/dL).  Coagulation Profile: No results for input(s): INR, PROTIME in the last 168 hours.  No results found for this or any previous visit (from the past 240 hour(s)).      Radiology Studies: MR BRAIN W WO CONTRAST  Result Date: 11/10/2019 CLINICAL DATA:  Intracranial metastatic disease. EXAM: MRI HEAD WITHOUT AND WITH CONTRAST TECHNIQUE: Multiplanar, multiecho pulse sequences of the brain and surrounding structures were obtained without and with intravenous contrast. CONTRAST:  4mL GADAVIST GADOBUTROL 1 MMOL/ML IV SOLN COMPARISON:  Head CT 11/06/2019 and brain MRI 09/29/2019 FINDINGS: Brain: No acute infarct, acute hemorrhage or extra-axial collection. Contrast-enhancing mass in the right frontal lobe measures 4.2 x 2.6 x 3.7 cm. Previously the lesion measured 3.4 x 2.1 x 3.4 cm. Surrounding vasogenic edema is unchanged. There is leftward midline shift that measures 5 mm, unchanged. Normal volume of CSF spaces. No chronic microhemorrhage. There are no new contrast-enhancing lesions Vascular: Normal flow voids. Skull and upper  cervical spine: Normal marrow signal. Sinuses/Orbits: Small amount of right mastoid fluid. Paranasal sinuses are clear. Other: None. IMPRESSION: 1. Increased size of right frontal lobe lesion with unchanged surrounding vasogenic edema and 5 mm leftward midline shift. 2. No new lesion. Electronically Signed   By: Ulyses Jarred M.D.   On: 11/10/2019 23:44        Scheduled Meds: . sodium chloride   Intravenous Once  . busPIRone  5 mg Oral Daily  . Chlorhexidine Gluconate Cloth  6 each Topical Daily  . ciprofloxacin  500 mg Oral BID  . clopidogrel  75 mg Oral  Daily  . collagenase   Topical Daily  . collagenase   Topical Daily  . dexamethasone  4 mg Oral Q8H  . enoxaparin (LOVENOX) injection  40 mg Subcutaneous Q24H  . famotidine  20 mg Oral Daily  . FLUoxetine  10 mg Oral Daily  . insulin aspart  0-9 Units Subcutaneous TID WC  . levETIRAcetam  500 mg Oral BID  . levothyroxine  112 mcg Oral Daily  . nystatin cream   Topical BID  . sodium chloride flush  10-40 mL Intracatheter Q12H  . umeclidinium-vilanterol  1 puff Inhalation Daily   Continuous Infusions: . sodium chloride Stopped (11/07/19 1509)     LOS: 12 days     Cordelia Poche, MD [facility] 11/11/2019, 1:12 PM  If 7PM-7AM, please contact night-coverage [email or web address]

## 2019-11-11 NOTE — Progress Notes (Signed)
[facility] Liaison Note:  Notified by [facility] that the current discharge plan is for patient to go home with home health. Patient is agreeable to outpatient Palliative following. [place] notified. Patient information given to referral. Thank you. Flo Shanks BSN, [place] [phone]

## 2019-11-11 NOTE — Progress Notes (Signed)
Subjective:  CC: Kristina Wright is a 64 y.o. female  Hospital stay day 12, 12 Days Post-Op Hartman's procedure for perforated diverticulitis  HPI: No acute issues overnight.  ROS:  General: Denies weight loss, weight gain, fatigue, fevers, chills, and night sweats. Heart: Denies chest pain, palpitations, racing heart, irregular heartbeat, leg pain or swelling, and decreased activity tolerance. Respiratory: Denies breathing difficulty, shortness of breath, wheezing, cough, and sputum. GI: Denies change in appetite, heartburn, nausea, vomiting, constipation, diarrhea, and blood in stool. GU: Denies difficulty urinating, pain with urinating, urgency, frequency, blood in urine.   Objective:   Temp:  [97.9 F (36.6 C)-98.7 F (37.1 C)] 98.3 F (36.8 C) (07/23 0436) Pulse Rate:  [91-95] 91 (07/23 0436) Resp:  [16] 16 (07/23 0436) BP: (157-158)/(71-80) 157/79 (07/23 0436) SpO2:  [96 %] 96 % (07/23 0436)     Height: 5\' 9"  (175.3 cm) Weight: 101.8 kg BMI (Calculated): 33.13   Intake/Output this shift:   Intake/Output Summary (Last 24 hours) at 11/11/2019 0849 Last data filed at 11/11/2019 0505 Gross per 24 hour  Intake 240 ml  Output 1125 ml  Net -885 ml    Constitutional :  alert, cooperative, appears stated age and no distress  Respiratory:  clear to auscultation bilaterally  Cardiovascular:  regular rate and rhythm  Gastrointestinal: .  Soft, no guarding, midline incision packing recently changed. No evidence of expanding erythema or cellulitis from area.  Fascia feels intact, persistant exudative material  overlying fascia at base, stable. Ostomy bag with gas and stool.  JP drain with less purulent drainage today, more serous. 2ml/24hr  Skin: Cool and moist  Psychiatric: Normal affect, non-agitated, not confused       LABS:  CMP Latest Ref Rng & Units 11/08/2019 11/06/2019 11/05/2019  Glucose 70 - 99 mg/dL 156(H) 104(H) 109(H)  BUN 8 - 23 mg/dL 13 8 10   Creatinine 0.44 - 1.00  mg/dL 0.83 0.80 0.75  Sodium 135 - 145 mmol/L 139 138 138  Potassium 3.5 - 5.1 mmol/L 3.2(L) 3.6 3.6  Chloride 98 - 111 mmol/L 103 105 104  CO2 22 - 32 mmol/L 28 22 25   Calcium 8.9 - 10.3 mg/dL 8.1(L) 7.5(L) 8.1(L)  Total Protein 6.5 - 8.1 g/dL - - -  Total Bilirubin 0.3 - 1.2 mg/dL - - -  Alkaline Phos 38 - 126 U/L - - -  AST 15 - 41 U/L - - -  ALT 0 - 44 U/L - - -   CBC Latest Ref Rng & Units 11/08/2019 11/06/2019 11/04/2019  WBC 4.0 - 10.5 K/uL 9.9 10.3 9.9  Hemoglobin 12.0 - 15.0 g/dL 9.3(L) 9.4(L) 9.7(L)  Hematocrit 36 - 46 % 29.1(L) 27.7(L) 28.6(L)  Platelets 150 - 400 K/uL 163 135(L) 129(L)    RADS:   Assessment:   Status post Hartman's procedure for perforated diverticulitis, midline wound infection with pseudomonas.   Pt now arranged to go to SNF,  Not home hospice.  Placement pending.  Recommend adding santyl to base of wound prior to re-packing with wet 4x4 kerlex roll, cover with abd pad, and secure with paper tape.  JP drain to remain in place until further notice.  Monitor output daily.  Continue oral ABX course for intra-abdominal infection.  The abx is not for the wound infection, since source control has already been achieved with opening the wound and continuing with daily dressing changes.  Still ok to be d/c'd from surgery standpoint once arrangements made.  Will see next week  if still inhouse.

## 2019-11-11 NOTE — TOC Progression Note (Addendum)
Transition of Care [facility]) - Progression Note    Patient Details  Name: Kristina Wright MRN: [id number] Date of Birth: 1955/05/18  Transition of Care [facility]) CM/SW [place], LCSW Phone Number: 11/11/2019, 9:17 AM  Clinical Narrative: Left another voicemail for [facility] admissions coordinator to confirm they are in network with [facility] and to see if they could accept patient.    9:34 am: Received call back from admissions coordinator at [facility]. One of patient's home medications is Tagrisso. They are unable to obtain this medication through their pharmacy so if needed at discharge, she will need to bring it from home. Admissions coordinator also asked about possibility of chemo/radiation during rehab. They would not be able to accept if so because they will have to take on those costs. Sent message to attending MD and oncologist to ask. SNF admissions coordinator spoke with [facility] yesterday and they told her patient would owe 25% per day. She is waiting on a call back to determine how much that would be. Patient only gets 43 SNF days per calendar year through her insurance.  10:25 am: Per surgery and oncology, patient can continue Tagrisso at discharge. Per oncology, no plans for radiation at this time. CSW left message for [facility] admissions coordinator to notify.  11:33 am: [facility] said patient would have to pay $2400 up front and that would cover the 25% daily copay. CSW spoke to admissions coordinator at [facility]. She will call [facility] to see if it will be the same for them. She will also speak to their pharmacy to see if they can supply the Tagrisso or if she can bring it from home and then check her disability status because she feels like patient has long-term care potential.  1:42 pm: CSW met with patient and provided update. She said she cannot afford the $2400 copay. CSW let her know that [facility]  is checking her insurance to see if she would have to pay that up front but she said she does not want to go that far away. Her only other offers are [place], [facility] in [place], and [facility] in [place]. Patient wants to discharge home and continue home health through [facility]. CSW asked if I could call family to update them. Patient declined and said she would do it. CSW will follow up this afternoon to make sure she spoke to her family. Patient said her neighbor that is a nurse has offered to assist with her wound care and just needs instructions on what needs to be done. CSW will follow up with patient this afternoon to check on the conversation with family about home health.  4:35 pm: Patient said she notified her daughter about her plan to go home. She gave CSW her family's phone numbers in order to coordinate getting her home. Kristina Wright (Son that lives with her): [phone], Kristina Wright): [phone], Kristina Wright (Sister): [phone], Kristina Wright (Sister): [phone], Kristina Wright (Ex-husband): [phone], and Kristina Wright Audiological scientist and nurse): [phone]. CSW left voicemail for sister Kristina Wright.  5:10 pm: Received call back from [place]. She is very concerned about patient returning home and said she will update their other sister Kristina Wright.  Expected Discharge Plan: [place] Barriers to Discharge: Continued Medical Work up  Expected Discharge Plan and Services Expected Discharge Plan: [place]  Acute Care Choice: Home Health Living arrangements for the past 2 months: Single Family Home                           HH Arranged: RN, PT, OT, Nurse's Aide, Social Work [employer] Agency: [place] ([place]) Date [place]: 11/04/19   Representative spoke with at [place]: [place] ([place]) Interventions    Readmission  Risk Interventions Readmission Risk Prevention Plan 11/04/2019  PCP or Specialist Appt within 3-5 Days Complete  HRI or [place] Complete  Social Work Consult for [place] Planning/Counseling Complete  Palliative Care Screening Not Applicable  Some recent data might be hidden

## 2019-11-11 NOTE — Plan of Care (Signed)
Continuing with plan of care. 

## 2019-11-11 NOTE — TOC Progression Note (Signed)
Transition of Care [facility]) - Progression Note    Patient Details  Name: Kristina Wright MRN: [id number] Date of Birth: [birth year]  Transition of Care [facility]) CM/SW [place], LCSW Phone Number: 11/11/2019, 8:25 AM  Clinical Narrative: PASARR obtained: [id number].    Expected Discharge Plan: [place] Barriers to Discharge: Continued Medical Work up  Expected Discharge Plan and Services Expected Discharge Plan: [place] Choice: [place] arrangements for the past 2 months: Single Family Home                           HH Arranged: RN, PT, OT, Nurse's Aide, Social Work [employer] Agency: [place] ([place]) Date [place]: 11/04/19   Representative spoke with at [place]: [place] ([place]) Interventions    Readmission Risk Interventions Readmission Risk Prevention Plan 11/04/2019  PCP or Specialist Appt within 3-5 Days Complete  HRI or [place] Complete  Social Work Consult for [place] Planning/Counseling Complete  Palliative Care Screening Not Applicable  Some recent data might be hidden

## 2019-11-12 DIAGNOSIS — G936 Cerebral edema: Secondary | ICD-10-CM | POA: Diagnosis not present

## 2019-11-12 DIAGNOSIS — K631 Perforation of intestine (nontraumatic): Secondary | ICD-10-CM | POA: Diagnosis not present

## 2019-11-12 DIAGNOSIS — C3490 Malignant neoplasm of unspecified part of unspecified bronchus or lung: Secondary | ICD-10-CM | POA: Diagnosis not present

## 2019-11-12 DIAGNOSIS — D649 Anemia, unspecified: Secondary | ICD-10-CM | POA: Diagnosis not present

## 2019-11-12 LAB — BASIC METABOLIC PANEL
Anion gap: 10 (ref 5–15)
BUN: 19 mg/dL (ref 8–23)
CO2: 27 mmol/L (ref 22–32)
Calcium: 8.1 mg/dL — ABNORMAL LOW (ref 8.9–10.3)
Chloride: 103 mmol/L (ref 98–111)
Creatinine, Ser: 0.69 mg/dL (ref 0.44–1.00)
GFR calc Af Amer: >60 mL/min (ref >60)
GFR calc non Af Amer: >60 mL/min (ref >60)
Glucose, Bld: 94 mg/dL (ref 70–99)
Potassium: 3.4 mmol/L — ABNORMAL LOW (ref 3.5–5.1)
Sodium: 140 mmol/L (ref 135–145)

## 2019-11-12 LAB — GLUCOSE, CAPILLARY
Glucose-Capillary: 101 mg/dL — ABNORMAL HIGH (ref 70–99)
Glucose-Capillary: 137 mg/dL — ABNORMAL HIGH (ref 70–99)
Glucose-Capillary: 116 mg/dL — ABNORMAL HIGH (ref 70–99)
Glucose-Capillary: 174 mg/dL — ABNORMAL HIGH (ref 70–99)

## 2019-11-12 MED ORDER — ALPRAZOLAM 0.5 MG PO TABS
0.5000 mg | ORAL_TABLET | Freq: Every evening | ORAL | Status: DC | PRN
  Administered 2019-11-12 – 2019-11-17 (×5): 0.5 mg via ORAL
  Filled 2019-11-12 (×5): qty 1

## 2019-11-12 NOTE — TOC Progression Note (Signed)
Transition of Care [facility]) - Progression Note    Patient Details  Name: Kristina Wright MRN: [id number] Date of Birth: 01-19-1956  Transition of Care [facility]) CM/SW [place], [place] Phone Number: 11/12/2019, 12:53 PM  Clinical Narrative:     CSW talked with Dr. Lonny Prude about possibly discharge if CSW can get in touch with sister, Kenney Houseman, regarding a discharge plan for the patient.  CSW attempted to call patient's sister, Kenney Houseman, @: 10am and 12:50pm  Patient's sister, Kenney Houseman, called CSW at 2:15am. Patient's sister stated that the family may be able to pay the $2400 to get the patient at [facility] per her and her other sister. CSW asked if the patient would be okay with that. Patient's sister asked why we have not reached out to her children because they would be first in line. Patient's sister stated that one of her sons that lives with her is too young. Patient's sister asked if she could get with all of the family tonight, talk out a plan, of either getting the upfront money up for SNF or figure out a way for the patient to go home. Patient's sister stated that she will call CSW in the morning to go over and then CSW can go talk with patient about options.  CSW informed Dr. Lonny Prude and patient's nurse and will follow up on the following day.   Expected Discharge Plan: [place] Barriers to Discharge: Continued Medical Work up  Expected Discharge Plan and Services Expected Discharge Plan: [place] Choice: [place] arrangements for the past 2 months: Single Family Home                           HH Arranged: RN, PT, OT, Nurse's Aide, Social Work [employer] Agency: [place] ([place]) Date [place]: 11/04/19   Representative spoke with at [place]: [place] ([place]) Interventions    Readmission Risk Interventions Readmission Risk Prevention Plan  11/04/2019  PCP or Specialist Appt within 3-5 Days Complete  HRI or [place] Complete  Social Work Consult for [place] Planning/Counseling Complete  Palliative Care Screening Not Applicable  Some recent data might be hidden

## 2019-11-12 NOTE — Progress Notes (Signed)
PROGRESS NOTE    Kristina Wright  ZOX:[id number] DOB: [birth year] DOA: 10/30/2019 PCP: Juline Patch, MD   Brief Narrative: Kristina Wright is a 64 y.o. female with a history of hypothyroidism, stage IV lung cancer with brain metastasis, hypertension, hyperlipidemia, depression, cervical cancer.  Patient presents secondary to diarrhea and abdominal pain.  CT abdomen and pelvis was revealing for a perforated bowel secondary to perforated diverticulitis.  Patient was emergently managed in the OR and underwent exploratory laparotomy with lysis of adhesions, abdominal washout, sigmoid colon resection with colostomy.  Hospitalization was complicated by metabolic encephalopathy secondary to vasogenic edema in setting of metastatic lesions which responded to steroid therapy.  Hospitalization was also complicated by abdominal wound infection managed on IV antibiotics and wound care per general surgery.  Plan for discharge home with hospice.   Assessment & Plan:   Active Problems:   Primary malignant neoplasm of lung metastatic to other site [facility])   Bowel perforation (HCC)   Pressure injury of skin   Vasogenic brain edema (HCC)   Normocytic anemia   Palliative care by specialist   Advanced care planning/counseling discussion   DNR (do not resuscitate)   Perforated diverticulitis Patient underwent emergent exploratory laparotomy with lysis of adhesions and sigmoid colon resection with colostomy complicated by midline wound infection.  Postop, patient was managed in ICU while intubated.  She was extubated on 10/31/2019.  Patient was managed on NG tube which was discontinued on 11/02/2019.  Diet has been advanced to regular.  She was started on IV Zosyn for her wound infection.  Repeat CT scan significant for fluid collection which should be evacuated by current drain in place per general surgery recommendations. -Wound care per general surgery  Abdominal wound infection Concern for possible Pseudomonas  infection.  No cultures obtained.  Patient has been managed on IV Zosyn with recommendations per general surgery to transition to oral antibiotics. Unsure of organism that we are treating; will use General surgery's assessment and treat for Pseudomonas. -Continue ciprofloxacin 500 mg twice daily  Acute metabolic encephalopathy Secondary to vasogenic edema from metastatic lesions. Improved with steroid treatment. She was initially managed in ICU and transitioned to comfort measures before mental status drastically improved. Currently at baseline.  Vasogenic edema Secondary to metastatic lesions -Continue Decadron 8 mg BID  Stage IV lung cancer with metastasis to brain Patient follows with oncology. Started on [place] for seizure prophylaxis. Previously DNR and now full code. Patient is wanting full scope care and treatment options for cancer. Medical oncology consulted and has recommended restarting osimertinib.  Acute blood loss anemia In setting of above. Patient required 1 unit of PRBC. Hemoglobin is stable.  Mouth twitching Unknown etiology. Possibly facial spasms. Resolved spontaneously. Evaluated by neurology who did not think episode was related to seizure activity.  Hypothyroidism -Continue Synthroid  Pressure injury Mid-sacrum, unsure if present on admission   DVT prophylaxis: Lovenox Code Status:   Code Status: Full Code Family Communication: None at bedside Disposition Plan: Discharge plan now is for home but unsafe at this current time secondary to patient's intermittent confusion. Social work working on getting family to accommodate patient   Consultants:   General surgery  Neurology  PCCM  Hematology/Oncology  Palliative care medicine  Procedures:   EXPLORATORY LAPAROTOMY; LYSIS OF ADHESIONS; ABDOMINAL WASHOUT; SIDMOID COLON RESECTION WITH COLOSTOMY (10/30/2019)  Antimicrobials:  Zosyn (7/11>>7/21)    Subjective: No issues.  Objective: Vitals:    11/11/19 8119 11/11/19 1155 11/11/19 1957 11/12/19 0458  BP: (!) 157/79 (!) 155/83 (!) 152/79 (!) 149/80  Pulse: 91 93 103 77  Resp: 16  20 20   Temp: 98.3 F (36.8 C) 98.1 F (36.7 C) 97.7 F (36.5 C) 97.7 F (36.5 C)  TempSrc:  Oral Oral Oral  SpO2: 96% 96% 96% 98%  Weight:      Height:        Intake/Output Summary (Last 24 hours) at 11/12/2019 0933 Last data filed at 11/12/2019 0509 Gross per 24 hour  Intake --  Output 1200 ml  Net -1200 ml   Filed Weights   10/30/19 1452 10/30/19 2248 11/06/19 1538  Weight: 103 kg 100.6 kg 101.8 kg    Examination:  General exam: Appears calm and comfortable Respiratory system: Clear to auscultation. Respiratory effort normal. Cardiovascular system: S1 & S2 heard, RRR. No murmurs, rubs, gallops or clicks. Gastrointestinal system: Abdomen is nondistended, soft and nontender. No organomegaly or masses felt. Normal bowel sounds heard. Ostomy bag with brown stool Central nervous system: Alert and oriented. No focal neurological deficits. Musculoskeletal: LE edema. No calf tenderness Skin: No cyanosis. No rashes Psychiatry: Judgement and insight appear normal. Blunt affect   Data Reviewed: I have personally reviewed following labs and imaging studies  CBC Lab Results  Component Value Date   WBC 9.9 11/08/2019   RBC 3.02 (L) 11/08/2019   HGB 9.3 (L) 11/08/2019   HCT 29.1 (L) 11/08/2019   MCV 96.4 11/08/2019   MCH 30.8 11/08/2019   PLT 163 11/08/2019   MCHC 32.0 11/08/2019   RDW 15.6 (H) 11/08/2019   LYMPHSABS 1.2 11/08/2019   MONOABS 0.4 11/08/2019   EOSABS 0.0 11/08/2019   BASOSABS 0.0 16/96/7893     Last metabolic panel Lab Results  Component Value Date   NA 140 11/12/2019   K 3.4 (L) 11/12/2019   CL 103 11/12/2019   CO2 27 11/12/2019   BUN 19 11/12/2019   CREATININE 0.69 11/12/2019   GLUCOSE 94 11/12/2019   GFRNONAA >60 11/12/2019   GFRAA >60 11/12/2019   CALCIUM 8.1 (L) 11/12/2019   PHOS 2.8 11/06/2019   PROT  6.7 10/30/2019   ALBUMIN 2.2 (L) 10/30/2019   LABGLOB 2.8 08/02/2019   AGRATIO 1.5 08/02/2019   BILITOT 0.9 10/30/2019   ALKPHOS 100 10/30/2019   AST 26 10/30/2019   ALT 43 10/30/2019   ANIONGAP 10 11/12/2019    CBG (last 3)  Recent Labs    11/11/19 1603 11/11/19 2147 11/12/19 0758  GLUCAP 118* 215* 101*     GFR: Estimated Creatinine Clearance: 93.7 mL/min (by C-G formula based on SCr of 0.69 mg/dL).  Coagulation Profile: No results for input(s): INR, PROTIME in the last 168 hours.  No results found for this or any previous visit (from the past 240 hour(s)).      Radiology Studies: MR BRAIN W WO CONTRAST  Result Date: 11/10/2019 CLINICAL DATA:  Intracranial metastatic disease. EXAM: MRI HEAD WITHOUT AND WITH CONTRAST TECHNIQUE: Multiplanar, multiecho pulse sequences of the brain and surrounding structures were obtained without and with intravenous contrast. CONTRAST:  18mL GADAVIST GADOBUTROL 1 MMOL/ML IV SOLN COMPARISON:  Head CT 11/06/2019 and brain MRI 09/29/2019 FINDINGS: Brain: No acute infarct, acute hemorrhage or extra-axial collection. Contrast-enhancing mass in the right frontal lobe measures 4.2 x 2.6 x 3.7 cm. Previously the lesion measured 3.4 x 2.1 x 3.4 cm. Surrounding vasogenic edema is unchanged. There is leftward midline shift that measures 5 mm, unchanged. Normal volume of CSF spaces. No chronic microhemorrhage. There are  no new contrast-enhancing lesions Vascular: Normal flow voids. Skull and upper cervical spine: Normal marrow signal. Sinuses/Orbits: Small amount of right mastoid fluid. Paranasal sinuses are clear. Other: None. IMPRESSION: 1. Increased size of right frontal lobe lesion with unchanged surrounding vasogenic edema and 5 mm leftward midline shift. 2. No new lesion. Electronically Signed   By: Ulyses Jarred M.D.   On: 11/10/2019 23:44        Scheduled Meds: . sodium chloride   Intravenous Once  . busPIRone  5 mg Oral Daily  . Chlorhexidine  Gluconate Cloth  6 each Topical Daily  . ciprofloxacin  500 mg Oral BID  . clopidogrel  75 mg Oral Daily  . collagenase   Topical Daily  . collagenase   Topical Daily  . dexamethasone  4 mg Oral Q8H  . enoxaparin (LOVENOX) injection  40 mg Subcutaneous Q24H  . famotidine  20 mg Oral Daily  . FLUoxetine  10 mg Oral Daily  . insulin aspart  0-9 Units Subcutaneous TID WC  . levETIRAcetam  500 mg Oral BID  . levothyroxine  112 mcg Oral Daily  . nystatin cream   Topical BID  . osimertinib mesylate  80 mg Oral Daily  . sodium chloride flush  10-40 mL Intracatheter Q12H  . umeclidinium-vilanterol  1 puff Inhalation Daily   Continuous Infusions: . sodium chloride Stopped (11/07/19 1509)     LOS: 13 days     Cordelia Poche, MD [facility] 11/12/2019, 9:33 AM  If 7PM-7AM, please contact night-coverage [email or web address]

## 2019-11-12 NOTE — Plan of Care (Signed)
Continuing with plan of care. 

## 2019-11-13 DIAGNOSIS — K631 Perforation of intestine (nontraumatic): Secondary | ICD-10-CM | POA: Diagnosis not present

## 2019-11-13 DIAGNOSIS — G936 Cerebral edema: Secondary | ICD-10-CM | POA: Diagnosis not present

## 2019-11-13 DIAGNOSIS — D649 Anemia, unspecified: Secondary | ICD-10-CM | POA: Diagnosis not present

## 2019-11-13 DIAGNOSIS — C3490 Malignant neoplasm of unspecified part of unspecified bronchus or lung: Secondary | ICD-10-CM | POA: Diagnosis not present

## 2019-11-13 LAB — GLUCOSE, CAPILLARY
Glucose-Capillary: 121 mg/dL — ABNORMAL HIGH (ref 70–99)
Glucose-Capillary: 140 mg/dL — ABNORMAL HIGH (ref 70–99)
Glucose-Capillary: 132 mg/dL — ABNORMAL HIGH (ref 70–99)
Glucose-Capillary: 155 mg/dL — ABNORMAL HIGH (ref 70–99)

## 2019-11-13 MED ORDER — FLUCONAZOLE 50 MG PO TABS
150.0000 mg | ORAL_TABLET | Freq: Once | ORAL | Status: AC
  Administered 2019-11-13: 150 mg via ORAL
  Filled 2019-11-13: qty 1

## 2019-11-13 NOTE — Plan of Care (Signed)
Continuing with plan of care. 

## 2019-11-13 NOTE — Progress Notes (Signed)
Physical Therapy Treatment Patient Details Name: Kristina Wright MRN: [id number] DOB: 1955/10/08 Today's Date: 11/13/2019    History of Present Illness 64 yo female admitted with generalized weakness and diarrhea. Found to have bowel perforation s/p exploratory laparotomy requiring bowel resection along with colostomy and remained mechanically intubated postop. Patient extubated 10/31/19. History of lung cancer with brain mets, COPD, bilateral LE wounds     PT Comments    Pt agreeable and excited to participate in PT this morning. Pt performed bed mobility today including rolling side to side and supine <> sit. Pt able to initiate rolling to L and R however required mod A+1 to come into complete and full sidelying position on flat bed. Pt encouraged to actively assist nursing staff with bed mobility when she is getting cleaned up in bed. Pt voiced understanding. Pt then performed supine to sit with mod A for truncal elevation with HOB elevated. Pt performed therex (active and active assisted) while semi supine and when seated EOB for promotion of muscle strengthening, joint maintenance for carryover to improved functional mobility. Pt fatigued and required max+2 A for sit to supine for trunk management and BLE elevation back onto bed. Pt requires increased time to perform all tasks and limiting factors include pain. Pt deferred standing activity attempts today secondary to abdominal pain.  Concerns for current ankle positioning in rested plantarflexion becoming tight and limiting dorsiflexion range. Attempted to gently range ankles however limited due to increased pain and location of wounds/sores on heels. At this time, continue to recommend STR to optimize return to PLOF and maximize functional mobility/independence.   Follow Up Recommendations  SNF     Equipment Recommendations  Rolling walker with 5" wheels;3in1 (PT);Wheelchair (measurements PT);Hospital bed;Wheelchair cushion (measurements  PT);Other (comment)    Recommendations for Other Services       Precautions / Restrictions Precautions Precautions: Fall Restrictions Weight Bearing Restrictions: No    Mobility  Bed Mobility Overal bed mobility: Needs Assistance Bed Mobility: Rolling;Supine to Sit Rolling: Mod assist   Supine to sit: Mod assist;HOB elevated Sit to supine: Max assist;+2 for physical assistance   General bed mobility comments: pt able to initiate rolling to L and R sides with bed flat; verbal cues for sequencing and positioning of BUE/BLE; mod A to come into full sidelying position bilaterally; supine to sit with with HOB elevated required mod A for trunk management to come into fully upright sitting; max+2 for sit to supine for trunk control on descent and BLE elevation onto bed  Transfers                 General transfer comment: transfers not attempted today  Ambulation/Gait                 Stairs             Wheelchair Mobility    Modified Rankin (Stroke Patients Only)       Balance Overall balance assessment: Needs assistance Sitting-balance support: Feet supported;Bilateral upper extremity supported Sitting balance-Leahy Scale: Fair Sitting balance - Comments: pt sitting EOB unsupported with heavy BUE support on bedrail/bed mattress; pt required min A occasionally for steadying with pt able to maintain static sitting with SBA                                    Cognition Arousal/Alertness: Awake/alert Behavior During Therapy: WFL for tasks assessed/performed Overall Cognitive  Status: Within Functional Limits for tasks assessed                                        Exercises Other Exercises Other Exercises: pt performed AAROM: hip ab/add and heel slides (clinician elevated heels) x 15 bilat; AROM isometric hip add, SAQ, LAQ, and scap retractions x 15 bilat    General Comments General comments (skin integrity, edema, etc.):  pt visibly bleeding from L knee from small skin tear      Pertinent Vitals/Pain Pain Assessment: 0-10 Pain Score: 5  Pain Location: abdomen around incisional site Pain Descriptors / Indicators: Discomfort;Constant Pain Intervention(s): Monitored during session;Repositioned;Limited activity within patient's tolerance    Home Living                      Prior Function            PT Goals (current goals can now be found in the care plan section) Acute Rehab PT Goals Patient Stated Goal: to go home  PT Goal Formulation: With patient Time For Goal Achievement: 11/15/19 Potential to Achieve Goals: Good Progress towards PT goals: Progressing toward goals    Frequency    Min 2X/week      PT Plan Current plan remains appropriate    Co-evaluation     PT goals addressed during session: Strengthening/ROM;Balance;Mobility/safety with mobility        AM-PAC PT "6 Clicks" Mobility   Outcome Measure  Help needed turning from your back to your side while in a flat bed without using bedrails?: A Lot Help needed moving from lying on your back to sitting on the side of a flat bed without using bedrails?: A Lot Help needed moving to and from a bed to a chair (including a wheelchair)?: Total Help needed standing up from a chair using your arms (e.g., wheelchair or bedside chair)?: Total Help needed to walk in hospital room?: Total Help needed climbing 3-5 steps with a railing? : Total 6 Click Score: 8    End of Session   Activity Tolerance: Patient tolerated treatment well;Patient limited by fatigue;Patient limited by pain Patient left: in bed;with call bell/phone within reach;with bed alarm set Nurse Communication: Mobility status PT Visit Diagnosis: Muscle weakness (generalized) (M62.81);Difficulty in walking, not elsewhere classified (R26.2)     Time: 4540-9811 PT Time Calculation (min) (ACUTE ONLY): 44 min  Charges:                        Vale Haven,  SPT   Vale Haven 11/13/2019, 12:26 PM

## 2019-11-13 NOTE — TOC Progression Note (Signed)
Transition of Care [facility]) - Progression Note    Patient Details  Name: Kristina Wright MRN: [id number] Date of Birth: [birth year]  [facility] of [facility] CM/SW [place], [place] Phone Number: 11/13/2019, 1:56 PM  Clinical Narrative:     CSW called patient's sister, Kenney Houseman, and spoke to her regarding a plan. Per patient's sister, patient is agreeable to go to [facility] and the patient's church is going to come up with the $2400.   CSW called [facility] and was told that the admissions coordinator, Fredia Beets, does not work on the weekends and someone will need to call on Monday.  CSW called patient's sister back to inform her and let the doctor know.   Expected Discharge Plan: [place] Barriers to Discharge: Continued Medical Work up  Expected Discharge Plan and Services Expected Discharge Plan: [place] Choice: [place] arrangements for the past 2 months: Single Family Home                           HH Arranged: RN, PT, OT, Nurse's Aide, Social Work [employer] Agency: [place] ([place]) Date Box: 11/04/19   Representative spoke with at [place]: [place] ([place]) Interventions    Readmission Risk Interventions Readmission Risk Prevention Plan 11/04/2019  PCP or Specialist Appt within 3-5 Days Complete  HRI or [place] Complete  Social Work Consult for [place] Planning/Counseling Complete  Palliative Care Screening Not Applicable  Some recent data might be hidden

## 2019-11-13 NOTE — Progress Notes (Signed)
PROGRESS NOTE    Kristina Wright  YIR:[id number] DOB: [birth year] DOA: 10/30/2019 PCP: Juline Patch, MD   Brief Narrative: Kristina Wright is a 64 y.o. female with a history of hypothyroidism, stage IV lung cancer with brain metastasis, hypertension, hyperlipidemia, depression, cervical cancer.  Patient presents secondary to diarrhea and abdominal pain.  CT abdomen and pelvis was revealing for a perforated bowel secondary to perforated diverticulitis.  Patient was emergently managed in the OR and underwent exploratory laparotomy with lysis of adhesions, abdominal washout, sigmoid colon resection with colostomy.  Hospitalization was complicated by metabolic encephalopathy secondary to vasogenic edema in setting of metastatic lesions which responded to steroid therapy.  Hospitalization was also complicated by abdominal wound infection managed on IV antibiotics and wound care per general surgery.  Plan for discharge home with hospice.   Assessment & Plan:   Active Problems:   Primary malignant neoplasm of lung metastatic to other site [facility])   Bowel perforation (HCC)   Pressure injury of skin   Vasogenic brain edema (HCC)   Normocytic anemia   Palliative care by specialist   Advanced care planning/counseling discussion   DNR (do not resuscitate)   Perforated diverticulitis Patient underwent emergent exploratory laparotomy with lysis of adhesions and sigmoid colon resection with colostomy complicated by midline wound infection.  Postop, patient was managed in ICU while intubated.  She was extubated on 10/31/2019.  Patient was managed on NG tube which was discontinued on 11/02/2019.  Diet has been advanced to regular.  She was started on IV Zosyn for her wound infection.  Repeat CT scan significant for fluid collection which should be evacuated by current drain in place per general surgery recommendations. -Wound care per general surgery  Abdominal wound infection Concern for possible Pseudomonas  infection.  No cultures obtained.  Patient has been managed on IV Zosyn with recommendations per general surgery to transition to oral antibiotics. No organism identified so patient transitioned to Ciprofloxacin. Patient has been treated for 2 weeks. Discontinue antibiotics.  Acute metabolic encephalopathy Secondary to vasogenic edema from metastatic lesions. Improved with steroid treatment. She was initially managed in ICU and transitioned to comfort measures before mental status drastically improved. Currently at baseline.  Vasogenic edema Secondary to metastatic lesions -Continue Decadron 8 mg BID  Stage IV lung cancer with metastasis to brain Patient follows with oncology. Started on [place] for seizure prophylaxis. Previously DNR and now full code. Patient is wanting full scope care and treatment options for cancer. Medical oncology consulted and has recommended restarting osimertinib.  Acute blood loss anemia In setting of above. Patient required 1 unit of PRBC. Hemoglobin is stable.  Mouth twitching Unknown etiology. Possibly facial spasms. Resolved spontaneously. Evaluated by neurology who did not think episode was related to seizure activity.  Hypothyroidism -Continue Synthroid  Pressure injury Mid-sacrum, unsure if present on admission   DVT prophylaxis: Lovenox Code Status:   Code Status: Full Code Family Communication: None at bedside Disposition Plan: Discharge to SNF in 1-2 days pending bed availability   Consultants:   General surgery  Neurology  PCCM  Hematology/Oncology  Palliative care medicine  Procedures:   EXPLORATORY LAPAROTOMY; LYSIS OF ADHESIONS; ABDOMINAL WASHOUT; SIDMOID COLON RESECTION WITH COLOSTOMY (10/30/2019)  Antimicrobials:  Zosyn (7/11>>7/21)   Ciprofloxacin (7/22>>7/25)   Subjective: No concerns  Objective: Vitals:   11/12/19 1154 11/12/19 2005 11/13/19 0519 11/13/19 1129  BP: (!) 151/64 (!) 146/79 (!) 151/81 (!) 143/78    Pulse: 91 96 60 94  Resp:  20 20 20    Temp: 98 F (36.7 C) 98.1 F (36.7 C) (!) 97.4 F (36.3 C) (!) 97.5 F (36.4 C)  TempSrc: Oral Oral  Oral  SpO2: 97% 97% 96% 97%  Weight:      Height:        Intake/Output Summary (Last 24 hours) at 11/13/2019 1444 Last data filed at 11/13/2019 0900 Gross per 24 hour  Intake --  Output 3000 ml  Net -3000 ml   Filed Weights   10/30/19 1452 10/30/19 2248 11/06/19 1538  Weight: 103 kg 100.6 kg 101.8 kg    Examination:  General exam: Appears calm and comfortable Respiratory system: Clear to auscultation. Respiratory effort normal. Cardiovascular system: S1 & S2 heard, RRR. No murmurs, rubs, gallops or clicks. Gastrointestinal system: Abdomen is nondistended, soft and nontender. Normal bowel sounds heard. Central nervous system: Alert and oriented. No focal neurological deficits. Musculoskeletal: BLE edema. No calf tenderness Skin: No cyanosis Psychiatry: Judgement and insight appear normal. Blunt affect    Data Reviewed: I have personally reviewed following labs and imaging studies  CBC Lab Results  Component Value Date   WBC 9.9 11/08/2019   RBC 3.02 (L) 11/08/2019   HGB 9.3 (L) 11/08/2019   HCT 29.1 (L) 11/08/2019   MCV 96.4 11/08/2019   MCH 30.8 11/08/2019   PLT 163 11/08/2019   MCHC 32.0 11/08/2019   RDW 15.6 (H) 11/08/2019   LYMPHSABS 1.2 11/08/2019   MONOABS 0.4 11/08/2019   EOSABS 0.0 11/08/2019   BASOSABS 0.0 65/06/5463     Last metabolic panel Lab Results  Component Value Date   NA 140 11/12/2019   K 3.4 (L) 11/12/2019   CL 103 11/12/2019   CO2 27 11/12/2019   BUN 19 11/12/2019   CREATININE 0.69 11/12/2019   GLUCOSE 94 11/12/2019   GFRNONAA >60 11/12/2019   GFRAA >60 11/12/2019   CALCIUM 8.1 (L) 11/12/2019   PHOS 2.8 11/06/2019   PROT 6.7 10/30/2019   ALBUMIN 2.2 (L) 10/30/2019   LABGLOB 2.8 08/02/2019   AGRATIO 1.5 08/02/2019   BILITOT 0.9 10/30/2019   ALKPHOS 100 10/30/2019   AST 26 10/30/2019    ALT 43 10/30/2019   ANIONGAP 10 11/12/2019    CBG (last 3)  Recent Labs    11/12/19 2123 11/13/19 0730 11/13/19 1127  GLUCAP 174* 121* 140*     GFR: Estimated Creatinine Clearance: 93.7 mL/min (by C-G formula based on SCr of 0.69 mg/dL).  Coagulation Profile: No results for input(s): INR, PROTIME in the last 168 hours.  No results found for this or any previous visit (from the past 240 hour(s)).      Radiology Studies: No results found.      Scheduled Meds: . sodium chloride   Intravenous Once  . busPIRone  5 mg Oral Daily  . Chlorhexidine Gluconate Cloth  6 each Topical Daily  . ciprofloxacin  500 mg Oral BID  . clopidogrel  75 mg Oral Daily  . collagenase   Topical Daily  . collagenase   Topical Daily  . dexamethasone  4 mg Oral Q8H  . enoxaparin (LOVENOX) injection  40 mg Subcutaneous Q24H  . famotidine  20 mg Oral Daily  . FLUoxetine  10 mg Oral Daily  . insulin aspart  0-9 Units Subcutaneous TID WC  . levETIRAcetam  500 mg Oral BID  . levothyroxine  112 mcg Oral Daily  . nystatin cream   Topical BID  . osimertinib mesylate  80 mg Oral Daily  .  sodium chloride flush  10-40 mL Intracatheter Q12H  . umeclidinium-vilanterol  1 puff Inhalation Daily   Continuous Infusions: . sodium chloride Stopped (11/07/19 1509)     LOS: 14 days     Cordelia Poche, MD [facility] 11/13/2019, 2:44 PM  If 7PM-7AM, please contact night-coverage [email or web address]

## 2019-11-14 DIAGNOSIS — G936 Cerebral edema: Secondary | ICD-10-CM | POA: Diagnosis not present

## 2019-11-14 DIAGNOSIS — C3490 Malignant neoplasm of unspecified part of unspecified bronchus or lung: Secondary | ICD-10-CM | POA: Diagnosis not present

## 2019-11-14 DIAGNOSIS — D649 Anemia, unspecified: Secondary | ICD-10-CM | POA: Diagnosis not present

## 2019-11-14 DIAGNOSIS — K631 Perforation of intestine (nontraumatic): Secondary | ICD-10-CM | POA: Diagnosis not present

## 2019-11-14 LAB — CBC
HCT: 33 % — ABNORMAL LOW (ref 36.0–46.0)
Hemoglobin: 10.6 g/dL — ABNORMAL LOW (ref 12.0–15.0)
MCH: 30.7 pg (ref 26.0–34.0)
MCHC: 32.1 g/dL (ref 30.0–36.0)
MCV: 95.7 fL (ref 80.0–100.0)
Platelets: 229 10*3/uL (ref 150–400)
RBC: 3.45 MIL/uL — ABNORMAL LOW (ref 3.87–5.11)
RDW: 16.3 % — ABNORMAL HIGH (ref 11.5–15.5)
WBC: 10.4 10*3/uL (ref 4.0–10.5)
nRBC: 0.7 % — ABNORMAL HIGH (ref 0.0–0.2)

## 2019-11-14 LAB — GLUCOSE, CAPILLARY
Glucose-Capillary: 154 mg/dL — ABNORMAL HIGH (ref 70–99)
Glucose-Capillary: 252 mg/dL — ABNORMAL HIGH (ref 70–99)
Glucose-Capillary: 107 mg/dL — ABNORMAL HIGH (ref 70–99)
Glucose-Capillary: 88 mg/dL (ref 70–99)

## 2019-11-14 LAB — BASIC METABOLIC PANEL
Anion gap: 10 (ref 5–15)
BUN: 18 mg/dL (ref 8–23)
CO2: 26 mmol/L (ref 22–32)
Calcium: 8.2 mg/dL — ABNORMAL LOW (ref 8.9–10.3)
Chloride: 102 mmol/L (ref 98–111)
Creatinine, Ser: 0.73 mg/dL (ref 0.44–1.00)
GFR calc Af Amer: >60 mL/min (ref >60)
GFR calc non Af Amer: >60 mL/min (ref >60)
Glucose, Bld: 163 mg/dL — ABNORMAL HIGH (ref 70–99)
Potassium: 3.5 mmol/L (ref 3.5–5.1)
Sodium: 138 mmol/L (ref 135–145)

## 2019-11-14 MED ORDER — DAKINS (1/4 STRENGTH) 0.125 % EX SOLN
Freq: Every morning | CUTANEOUS | Status: AC
  Filled 2019-11-14: qty 473

## 2019-11-14 NOTE — TOC Progression Note (Addendum)
Transition of Care [facility]) - Progression Note    Patient Details  Name: Kristina Wright MRN: [id number] Date of Birth: Sep 13, 1955  Transition of Care [facility]) CM/SW [place], LCSW Phone Number: 11/14/2019, 9:10 AM  Clinical Narrative:  CSW spoke to [facility] admissions coordinator and provided update from the weekend. She will start insurance authorization.   11:50 am: CSW met with patient and provided update. She called her daughter so CSW was able to update her as well.  Expected Discharge Plan: [place] Barriers to Discharge: Continued Medical Work up  Expected Discharge Plan and Services Expected Discharge Plan: [place] Choice: [place] arrangements for the past 2 months: Single Family Home                           HH Arranged: RN, PT, OT, Nurse's Aide, Social Work [employer] Agency: [place] ([place]) Date [place]: 11/04/19   Representative spoke with at [place]: [place] ([place]) Interventions    Readmission Risk Interventions Readmission Risk Prevention Plan 11/04/2019  PCP or Specialist Appt within 3-5 Days Complete  HRI or [place] Complete  Social Work Consult for [place] Planning/Counseling Complete  Palliative Care Screening Not Applicable  Some recent data might be hidden

## 2019-11-14 NOTE — Consult Note (Signed)
[place] Nurse wound follow up I am contacted today to request a desired strength for the Dakin's solution ordered last week by my associate, D. Barbie Haggis for the care of the patient's abdominal wound. I have amended the order to reflect common use: 1/4 strength Dakin's solution (sodium hypochlorite).   [place] nursing team will follow, and will remain available to this patient, the nursing and medical teams.  Next planned visit is Thursday or Friday of patient is still in house.  Thanks, Maudie Flakes, MSN, RN, [place], Arther Abbott  Pager# [phone]

## 2019-11-14 NOTE — Consult Note (Signed)
[place] Nurse ostomy follow up Stoma type/location: LLQ colostomy Stomal assessment/size: 1 and 5/8 inches oval opening in skin, stoma is below skin level Peristomal assessment: intact Treatment options for stomal/peristomal skin: convex 2-piece pouching system with skin barrier ring flattened and placed around opening in abdomen. Output: brown stool Ostomy pouching: 2pc. 2 and 3/4 inch convex pouching system with skin barrier ring Education provided:  Patient says she has watched video a couple of times and also looked at ostomy care via YouTube. She reports that box of [place] ostomy supplies from [employer] has been delivered at her home. She understands that the washcloth is used to both assist in removal of pouching system and to wash stoma and skin.  Her only question during or after pouch change procedure is whether or not she can  shower or swim with pouch on and she is assured that she can. Another teaching booklet is delivered to the room and 3 extra pouching set ups are assembled and delivered to the room as patient cannot find her book or supplies. She says some family members have taken a few items home already.  Enrolled patient in [place] Start Discharge program: Yes, previoiusly   [place] nursing team will not follow, but will remain available to this patient, the nursing and medical teams.  Please re-consult if needed. Thanks, Maudie Flakes, MSN, RN, [place], Arther Abbott  Pager# [phone]

## 2019-11-14 NOTE — Progress Notes (Signed)
PROGRESS NOTE    Kristina VILLASENOR  YNW:[id number] DOB: [birth year] DOA: 10/30/2019 PCP: Juline Patch, MD   Brief Narrative: Kristina Wright is a 64 y.o. female with a history of hypothyroidism, stage IV lung cancer with brain metastasis, hypertension, hyperlipidemia, depression, cervical cancer.  Patient presents secondary to diarrhea and abdominal pain.  CT abdomen and pelvis was revealing for a perforated bowel secondary to perforated diverticulitis.  Patient was emergently managed in the OR and underwent exploratory laparotomy with lysis of adhesions, abdominal washout, sigmoid colon resection with colostomy.  Hospitalization was complicated by metabolic encephalopathy secondary to vasogenic edema in setting of metastatic lesions which responded to steroid therapy.  Hospitalization was also complicated by abdominal wound infection managed on IV antibiotics and wound care per general surgery.  Plan for discharge home with hospice.   Assessment & Plan:   Active Problems:   Primary malignant neoplasm of lung metastatic to other site [facility])   Bowel perforation (HCC)   Pressure injury of skin   Vasogenic brain edema (HCC)   Normocytic anemia   Palliative care by specialist   Advanced care planning/counseling discussion   DNR (do not resuscitate)   Perforated diverticulitis Patient underwent emergent exploratory laparotomy with lysis of adhesions and sigmoid colon resection with colostomy complicated by midline wound infection.  Postop, patient was managed in ICU while intubated.  She was extubated on 10/31/2019.  Patient was managed on NG tube which was discontinued on 11/02/2019.  Diet has been advanced to regular.  She was started on IV Zosyn for her wound infection.  Repeat CT scan significant for fluid collection which should be evacuated by current drain in place per general surgery recommendations. -Wound care per general surgery  Abdominal wound infection Concern for possible Pseudomonas  infection.  No cultures obtained.  Patient has been managed on IV Zosyn with recommendations per general surgery to transition to oral antibiotics. No organism identified so patient transitioned to Ciprofloxacin. Patient has been treated for 2 weeks. Discontinue antibiotics.  Acute metabolic encephalopathy Secondary to vasogenic edema from metastatic lesions. Improved with steroid treatment. She was initially managed in ICU and transitioned to comfort measures before mental status drastically improved. Currently at baseline.  Vasogenic edema Secondary to metastatic lesions -Continue Decadron 8 mg BID  Stage IV lung cancer with metastasis to brain Patient follows with oncology. Started on [place] for seizure prophylaxis. Previously DNR and now full code. Patient is wanting full scope care and treatment options for cancer. Medical oncology consulted and has recommended restarting osimertinib. -Continue osimertinib  Acute blood loss anemia In setting of above. Patient required 1 unit of PRBC. Hemoglobin is stable.  Mouth twitching Unknown etiology. Possibly facial spasms. Resolved spontaneously. Evaluated by neurology who did not think episode was related to seizure activity.  Hypothyroidism -Continue Synthroid  Pressure injury Mid-sacrum, unsure if present on admission   DVT prophylaxis: Lovenox Code Status:   Code Status: Full Code Family Communication: None at bedside Disposition Plan: Discharge to SNF in 1-2 days pending bed availability   Consultants:   General surgery  Neurology  PCCM  Hematology/Oncology  Palliative care medicine  Procedures:   EXPLORATORY LAPAROTOMY; LYSIS OF ADHESIONS; ABDOMINAL WASHOUT; SIDMOID COLON RESECTION WITH COLOSTOMY (10/30/2019)  Antimicrobials:  Zosyn (7/11>>7/21)   Ciprofloxacin (7/22>>7/25)   Subjective: Eager to start her rehab. No concerns.  Objective: Vitals:   11/13/19 1129 11/13/19 2040 11/14/19 0507 11/14/19 1204    BP: (!) 143/78 (!) 140/85 (!) 149/84 (!) 146/65  Pulse: 94 101 81 88  Resp:  20 20 16   Temp: (!) 97.5 F (36.4 C) 98.4 F (36.9 C) (!) 97.5 F (36.4 C) (!) 97.4 F (36.3 C)  TempSrc: Oral Oral Oral   SpO2: 97% 95% 97% 98%  Weight:      Height:        Intake/Output Summary (Last 24 hours) at 11/14/2019 1427 Last data filed at 11/14/2019 1222 Gross per 24 hour  Intake 10 ml  Output 813 ml  Net -803 ml   Filed Weights   10/30/19 1452 10/30/19 2248 11/06/19 1538  Weight: 103 kg 100.6 kg 101.8 kg    Examination:  General exam: Appears calm and comfortable Respiratory system: Clear to auscultation. Respiratory effort normal. Cardiovascular system: S1 & S2 heard, RRR. No murmurs, rubs, gallops or clicks. Gastrointestinal system: Abdomen is nondistended, soft and nontender. No organomegaly or masses felt. Ostomy bag with air. Normal bowel sounds heard. Central nervous system: Alert and oriented. No focal neurological deficits. Musculoskeletal: BLE edema. No calf tenderness Skin: No cyanosis. Psychiatry: Judgement and insight appear normal. Mood & affect appropriate.    Data Reviewed: I have personally reviewed following labs and imaging studies  CBC Lab Results  Component Value Date   WBC 10.4 11/14/2019   RBC 3.45 (L) 11/14/2019   HGB 10.6 (L) 11/14/2019   HCT 33.0 (L) 11/14/2019   MCV 95.7 11/14/2019   MCH 30.7 11/14/2019   PLT 229 11/14/2019   MCHC 32.1 11/14/2019   RDW 16.3 (H) 11/14/2019   LYMPHSABS 1.2 11/08/2019   MONOABS 0.4 11/08/2019   EOSABS 0.0 11/08/2019   BASOSABS 0.0 46/96/2952     Last metabolic panel Lab Results  Component Value Date   NA 138 11/14/2019   K 3.5 11/14/2019   CL 102 11/14/2019   CO2 26 11/14/2019   BUN 18 11/14/2019   CREATININE 0.73 11/14/2019   GLUCOSE 163 (H) 11/14/2019   GFRNONAA >60 11/14/2019   GFRAA >60 11/14/2019   CALCIUM 8.2 (L) 11/14/2019   PHOS 2.8 11/06/2019   PROT 6.7 10/30/2019   ALBUMIN 2.2 (L)  10/30/2019   LABGLOB 2.8 08/02/2019   AGRATIO 1.5 08/02/2019   BILITOT 0.9 10/30/2019   ALKPHOS 100 10/30/2019   AST 26 10/30/2019   ALT 43 10/30/2019   ANIONGAP 10 11/14/2019    CBG (last 3)  Recent Labs    11/13/19 2128 11/14/19 0753 11/14/19 1204  GLUCAP 155* 154* 252*     GFR: Estimated Creatinine Clearance: 93.7 mL/min (by C-G formula based on SCr of 0.73 mg/dL).  Coagulation Profile: No results for input(s): INR, PROTIME in the last 168 hours.  No results found for this or any previous visit (from the past 240 hour(s)).      Radiology Studies: No results found.      Scheduled Meds: . sodium chloride   Intravenous Once  . busPIRone  5 mg Oral Daily  . Chlorhexidine Gluconate Cloth  6 each Topical Daily  . ciprofloxacin  500 mg Oral BID  . clopidogrel  75 mg Oral Daily  . collagenase   Topical Daily  . dexamethasone  4 mg Oral Q8H  . enoxaparin (LOVENOX) injection  40 mg Subcutaneous Q24H  . famotidine  20 mg Oral Daily  . FLUoxetine  10 mg Oral Daily  . insulin aspart  0-9 Units Subcutaneous TID WC  . levETIRAcetam  500 mg Oral BID  . levothyroxine  112 mcg Oral Daily  . nystatin cream  Topical BID  . osimertinib mesylate  80 mg Oral Daily  . sodium chloride flush  10-40 mL Intracatheter Q12H  . umeclidinium-vilanterol  1 puff Inhalation Daily   Continuous Infusions: . sodium chloride Stopped (11/07/19 1509)     LOS: 15 days     Cordelia Poche, MD [facility] 11/14/2019, 2:27 PM  If 7PM-7AM, please contact night-coverage [email or web address]

## 2019-11-15 DIAGNOSIS — K631 Perforation of intestine (nontraumatic): Secondary | ICD-10-CM | POA: Diagnosis not present

## 2019-11-15 DIAGNOSIS — Z7952 Long term (current) use of systemic steroids: Secondary | ICD-10-CM | POA: Diagnosis not present

## 2019-11-15 DIAGNOSIS — G936 Cerebral edema: Secondary | ICD-10-CM | POA: Diagnosis not present

## 2019-11-15 DIAGNOSIS — D84821 Immunodeficiency due to drugs: Secondary | ICD-10-CM

## 2019-11-15 DIAGNOSIS — C3490 Malignant neoplasm of unspecified part of unspecified bronchus or lung: Secondary | ICD-10-CM | POA: Diagnosis not present

## 2019-11-15 LAB — GLUCOSE, CAPILLARY
Glucose-Capillary: 118 mg/dL — ABNORMAL HIGH (ref 70–99)
Glucose-Capillary: 134 mg/dL — ABNORMAL HIGH (ref 70–99)
Glucose-Capillary: 109 mg/dL — ABNORMAL HIGH (ref 70–99)
Glucose-Capillary: 200 mg/dL — ABNORMAL HIGH (ref 70–99)

## 2019-11-15 MED ORDER — LISINOPRIL-HYDROCHLOROTHIAZIDE 20-25 MG PO TABS: 1.0000 | ORAL_TABLET | Freq: Every day | ORAL | Status: DC

## 2019-11-15 MED ORDER — HYDROCHLOROTHIAZIDE 25 MG PO TABS
25.0000 mg | ORAL_TABLET | Freq: Every day | ORAL | Status: DC
  Administered 2019-11-16 – 2019-11-21 (×6): 25 mg via ORAL
  Filled 2019-11-15 (×6): qty 1

## 2019-11-15 MED ORDER — ACYCLOVIR 200 MG PO CAPS
400.0000 mg | ORAL_CAPSULE | Freq: Two times a day (BID) | ORAL | Status: DC
  Administered 2019-11-15 – 2019-11-21 (×13): 400 mg via ORAL
  Filled 2019-11-15 (×14): qty 2

## 2019-11-15 MED ORDER — ENSURE ENLIVE PO LIQD
237.0000 mL | Freq: Three times a day (TID) | ORAL | Status: DC
  Administered 2019-11-15 – 2019-11-21 (×10): 237 mL via ORAL

## 2019-11-15 MED ORDER — ADULT MULTIVITAMIN W/MINERALS CH
1.0000 | ORAL_TABLET | Freq: Every day | ORAL | Status: DC
  Administered 2019-11-16 – 2019-11-21 (×6): 1 via ORAL
  Filled 2019-11-15 (×6): qty 1

## 2019-11-15 MED ORDER — ATORVASTATIN CALCIUM 10 MG PO TABS
10.0000 mg | ORAL_TABLET | Freq: Every day | ORAL | Status: DC
  Administered 2019-11-16 – 2019-11-21 (×6): 10 mg via ORAL
  Filled 2019-11-15 (×6): qty 1

## 2019-11-15 MED ORDER — LISINOPRIL 20 MG PO TABS
20.0000 mg | ORAL_TABLET | Freq: Every day | ORAL | Status: DC
  Administered 2019-11-16 – 2019-11-21 (×6): 20 mg via ORAL
  Filled 2019-11-15 (×6): qty 1

## 2019-11-15 NOTE — Progress Notes (Signed)
Physical Therapy Treatment Patient Details Name: Kristina Wright MRN: [id number] DOB: [birth year] Today's Date: 11/15/2019    History of Present Illness 64 yo female admitted with generalized weakness and diarrhea. Found to have bowel perforation s/p exploratory laparotomy requiring bowel resection along with colostomy and remained mechanically intubated postop. Patient extubated 10/31/19. History of lung cancer with brain mets, COPD, bilateral LE wounds     PT Comments    Patient is making progress with functional mobility. Patient needs Mod A for bed mobility. Patient sat up on edge of bed ~ 20 minutes with stand by assistance and occasional unilateral UE support. Attempted sit to stand transfers with Max A and cues for transfer technique. Patient able to clear posterior hips from bed surface but unable to come to squat or standing position. Patient motivated to participate with therapy. Therapeutic exercise performed in supine and sitting position. Recommend to continue PT to maximize function and safety to facilitate independence. SNF remains safest and most appropriate discharge plan based on functional limitations.    Follow Up Recommendations  SNF     Equipment Recommendations   (to be determined at next leve of care )    Recommendations for Other Services       Precautions / Restrictions Precautions Precautions: Fall Restrictions Weight Bearing Restrictions: No    Mobility  Bed Mobility Overal bed mobility: Needs Assistance Bed Mobility: Supine to Sit;Sit to Supine     Supine to sit: Mod assist Sit to supine: Mod assist   General bed mobility comments: assistance for trunk to sit upright and support for BLE to return to bed. verbal cues for safety and technique   Transfers Overall transfer level: Needs assistance               General transfer comment: attempted sit to stand transfer x 2 bouts with Max A. patient is able to shift weight forward and accept partial  weight through BLE and clear posteior buttocks. Unable to come to squat or standing position. Patient fatigued with activity. Patient required Max A for incremental scooting to right x 4 bouts. Cues for technique and safety with all transfer techniques. patient is motivated to participate with therapy and progress transfers as able   Ambulation/Gait                 Stairs             Wheelchair Mobility    Modified Rankin (Stroke Patients Only)       Balance   Sitting-balance support: Feet supported;Single extremity supported Sitting balance-Leahy Scale: Fair Sitting balance - Comments: patient using intermittent unilateral UE support due to muscle fatigue. stand by assistance for safety with sitting upright at edge of bed                                     Cognition Arousal/Alertness: Awake/alert Behavior During Therapy: [facility] for tasks assessed/performed Overall Cognitive Status: Within Functional Limits for tasks assessed                                 General Comments: patient is awake, alert, and following all commands without difficulty       Exercises General Exercises - Lower Extremity Ankle Circles/Pumps: AROM;Strengthening;Both;10 reps;Supine Long Arc Quad: AAROM;Strengthening;Both;10 reps;Seated Heel Slides: AAROM;Strengthening;Both;10 reps;Supine Hip ABduction/ADduction: AAROM;Strengthening;Both;10 reps;Supine Other Exercises  Other Exercises: verbal and tactile cues for exercise technique     General Comments        Pertinent Vitals/Pain Pain Assessment: No/denies pain    Home Living                      Prior Function            PT Goals (current goals can now be found in the care plan section) Acute Rehab PT Goals Patient Stated Goal: to go home  PT Goal Formulation: With patient Time For Goal Achievement: 11/29/19 Potential to Achieve Goals: Good Progress towards PT goals: Progressing toward  goals    Frequency    Min 2X/week      PT Plan Current plan remains appropriate (time for goal achievement adjusted, plan still appropriate)    Co-evaluation              AM-PAC PT "6 Clicks" Mobility   Outcome Measure  Help needed turning from your back to your side while in a flat bed without using bedrails?: A Lot Help needed moving from lying on your back to sitting on the side of a flat bed without using bedrails?: A Lot Help needed moving to and from a bed to a chair (including a wheelchair)?: Total Help needed standing up from a chair using your arms (e.g., wheelchair or bedside chair)?: Total Help needed to walk in hospital room?: Total Help needed climbing 3-5 steps with a railing? : Total 6 Click Score: 8    End of Session Equipment Utilized During Treatment: Gait belt Activity Tolerance: Patient tolerated treatment well Patient left: in bed;with bed alarm set;with call bell/phone within reach Nurse Communication: Mobility status PT Visit Diagnosis: Muscle weakness (generalized) (M62.81);Difficulty in walking, not elsewhere classified (R26.2)     Time: 9826-4158 PT Time Calculation (min) (ACUTE ONLY): 30 min  Charges:  $Therapeutic Exercise: 8-22 mins $Therapeutic Activity: 8-22 mins                     Minna Merritts, PT, MPT    Percell Locus 11/15/2019, 11:35 AM

## 2019-11-15 NOTE — Progress Notes (Signed)
PROGRESS NOTE    Kristina Wright  XAJ:[id number] DOB: [birth year] DOA: 10/30/2019 PCP: Juline Patch, MD   Brief Narrative: Kristina Wright is a 64 y.o. female with a history of hypothyroidism, stage IV lung cancer with brain metastasis, hypertension, hyperlipidemia, depression, cervical cancer.  Patient presents secondary to diarrhea and abdominal pain.  CT abdomen and pelvis was revealing for a perforated bowel secondary to perforated diverticulitis.  Patient was emergently managed in the OR and underwent exploratory laparotomy with lysis of adhesions, abdominal washout, sigmoid colon resection with colostomy.  Hospitalization was complicated by metabolic encephalopathy secondary to vasogenic edema in setting of metastatic lesions which responded to steroid therapy.  Hospitalization was also complicated by abdominal wound infection managed on IV antibiotics and wound care per general surgery.  Initial plan for discharge home with hospice which has now changed. Patient has decided on full scope treatment and plan is for discharge to SNF with outpatient hematology/oncology follow-up.   Assessment & Plan:   Active Problems:   Malignant neoplasm of lung (HCC)   Bowel perforation (HCC)   Pressure injury of skin   Brain edema (HCC)   Normocytic anemia   Palliative care by specialist   Advanced care planning/counseling discussion   DNR (do not resuscitate)   Immunosuppression due to chronic steroid use   Perforated diverticulitis Patient underwent emergent exploratory laparotomy with lysis of adhesions and sigmoid colon resection with colostomy complicated by midline wound infection.  Postop, patient was managed in ICU while intubated.  She was extubated on 10/31/2019.  Patient was managed on NG tube which was discontinued on 11/02/2019.  Diet has been advanced to regular.  She was started on IV Zosyn for her wound infection.  Repeat CT scan significant for fluid collection which should be evacuated  by current drain in place per general surgery recommendations. -Wound care per general surgery/WOC  Abdominal wound infection Concern for possible Pseudomonas infection.  No cultures obtained.  Patient has been managed on IV Zosyn with recommendations per general surgery to transition to oral antibiotics. No organism identified so patient transitioned to Ciprofloxacin. Patient has been treated for 2 weeks. Discontinued antibiotics.  Acute metabolic encephalopathy Secondary to vasogenic edema from metastatic lesions. Improved with steroid treatment. She was initially managed in ICU and transitioned to comfort measures before mental status drastically improved. Currently at baseline.  Vasogenic edema Secondary to metastatic lesions -Continue Decadron 4 mg TID; continue on discharge  Stage IV lung cancer with metastasis to brain Patient follows with oncology. Started on [place] for seizure prophylaxis. Previously DNR and now full code. Patient is wanting full scope care and treatment options for cancer. Medical oncology consulted and has recommended restarting osimertinib. -Continue osimertinib  Acute blood loss anemia In setting of above. Patient required 1 unit of PRBC. Hemoglobin is stable.  Mouth twitching Unknown etiology. Possibly facial spasms. Resolved spontaneously. Evaluated by neurology who did not think episode was related to seizure activity.  Hypothyroidism -Continue Synthroid  Pressure injury Mid-sacrum, unsure if present on admission   DVT prophylaxis: Lovenox Code Status:   Code Status: Full Code Family Communication: None at bedside Disposition Plan: Discharge to SNF in 1-2 days pending bed availability   Consultants:   General surgery  Neurology  PCCM  Hematology/Oncology  Palliative care medicine  Procedures:   EXPLORATORY LAPAROTOMY; LYSIS OF ADHESIONS; ABDOMINAL WASHOUT; SIDMOID COLON RESECTION WITH COLOSTOMY (10/30/2019)  Antimicrobials:  Zosyn  (7/11>>7/21)   Ciprofloxacin (7/22>>7/25)   Subjective: No issues overnight.  Objective: Vitals:   11/14/19 2034 11/15/19 0536 11/15/19 0638 11/15/19 1144  BP: (!) 156/88 (!) 176/96 (!) 169/74 (!) 138/79  Pulse: 89 78 71 91  Resp: 20 20  16   Temp: 98.1 F (36.7 C) 97.9 F (36.6 C)  98.4 F (36.9 C)  TempSrc: Oral Oral  Oral  SpO2: 98% 98%  97%  Weight:      Height:        Intake/Output Summary (Last 24 hours) at 11/15/2019 1351 Last data filed at 11/15/2019 1100 Gross per 24 hour  Intake 600 ml  Output 875 ml  Net -275 ml   Filed Weights   10/30/19 1452 10/30/19 2248 11/06/19 1538  Weight: 103 kg 100.6 kg 101.8 kg    Examination:  General exam: Appears calm and comfortable Respiratory system: Clear to auscultation. Respiratory effort normal. Cardiovascular system: S1 & S2 heard, RRR. No murmurs, rubs, gallops or clicks. Gastrointestinal system: Abdomen is nondistended, soft and nontender. Ostomy with air and brown stool. Abdominal wound dressing is CDI. Normal bowel sounds heard. Central nervous system: Alert and oriented. No focal neurological deficits. Musculoskeletal: BLE foot edema. No calf tenderness Skin: No cyanosis. No rashes Psychiatry: Judgement and insight appear normal. Mood & affect appropriate.     Data Reviewed: I have personally reviewed following labs and imaging studies  CBC Lab Results  Component Value Date   WBC 10.4 11/14/2019   RBC 3.45 (L) 11/14/2019   HGB 10.6 (L) 11/14/2019   HCT 33.0 (L) 11/14/2019   MCV 95.7 11/14/2019   MCH 30.7 11/14/2019   PLT 229 11/14/2019   MCHC 32.1 11/14/2019   RDW 16.3 (H) 11/14/2019   LYMPHSABS 1.2 11/08/2019   MONOABS 0.4 11/08/2019   EOSABS 0.0 11/08/2019   BASOSABS 0.0 75/91/6384     Last metabolic panel Lab Results  Component Value Date   NA 138 11/14/2019   K 3.5 11/14/2019   CL 102 11/14/2019   CO2 26 11/14/2019   BUN 18 11/14/2019   CREATININE 0.73 11/14/2019   GLUCOSE 163 (H)  11/14/2019   GFRNONAA >60 11/14/2019   GFRAA >60 11/14/2019   CALCIUM 8.2 (L) 11/14/2019   PHOS 2.8 11/06/2019   PROT 6.7 10/30/2019   ALBUMIN 2.2 (L) 10/30/2019   LABGLOB 2.8 08/02/2019   AGRATIO 1.5 08/02/2019   BILITOT 0.9 10/30/2019   ALKPHOS 100 10/30/2019   AST 26 10/30/2019   ALT 43 10/30/2019   ANIONGAP 10 11/14/2019    CBG (last 3)  Recent Labs    11/14/19 2113 11/15/19 0746 11/15/19 1143  GLUCAP 88 118* 134*     GFR: Estimated Creatinine Clearance: 93.7 mL/min (by C-G formula based on SCr of 0.73 mg/dL).  Coagulation Profile: No results for input(s): INR, PROTIME in the last 168 hours.  No results found for this or any previous visit (from the past 240 hour(s)).      Radiology Studies: No results found.      Scheduled Meds: . sodium chloride   Intravenous Once  . acyclovir  400 mg Oral BID  . atorvastatin  10 mg Oral Daily  . busPIRone  5 mg Oral Daily  . Chlorhexidine Gluconate Cloth  6 each Topical Daily  . clopidogrel  75 mg Oral Daily  . collagenase   Topical Daily  . dexamethasone  4 mg Oral Q8H  . enoxaparin (LOVENOX) injection  40 mg Subcutaneous Q24H  . famotidine  20 mg Oral Daily  . FLUoxetine  10 mg Oral Daily  .  lisinopril  20 mg Oral Daily   And  . hydrochlorothiazide  25 mg Oral Daily  . insulin aspart  0-9 Units Subcutaneous TID WC  . levETIRAcetam  500 mg Oral BID  . levothyroxine  112 mcg Oral Daily  . nystatin cream   Topical BID  . osimertinib mesylate  80 mg Oral Daily  . sodium chloride flush  10-40 mL Intracatheter Q12H  . sodium hypochlorite   Irrigation q morning - 10a  . umeclidinium-vilanterol  1 puff Inhalation Daily   Continuous Infusions: . sodium chloride Stopped (11/07/19 1509)     LOS: 16 days     Cordelia Poche, MD [facility] 11/15/2019, 1:51 PM  If 7PM-7AM, please contact night-coverage [email or web address]

## 2019-11-15 NOTE — Progress Notes (Signed)
Nutrition Follow Up Note   DOCUMENTATION CODES:   Obesity unspecified  INTERVENTION:   Ensure Enlive po TID, each supplement provides 350 kcal and 20 grams of protein  Magic cup TID with meals, each supplement provides 290 kcal and 9 grams of protein  MVI daily   Pt at high refeed risk; recommend monitor K, Mg and P labs daily until stable  NUTRITION DIAGNOSIS:   Inadequate oral intake related to acute illness as evidenced by other (comment) (pt on NPO/clear liquid diet since admit).  GOAL:   Patient will meet greater than or equal to 90% of their needs -progressing  MONITOR:   PO intake, Supplement acceptance, Labs, Weight trends, Skin, I & O's  ASSESSMENT:   64 yo female with a PMH of Hypothyroidism, Stage IV Malignant Neoplasm of the Lung with mets to the brain, HTN, HLD, Depression and Cervical Cancer (dx 1989) who was admitted with bowel perforation now s/p exploratory laparatomy, lysis of adhesions, abdominal washout, sigmoid colon resection with colostomy (Hartmann's procedure) 7/12   Pt advanced to a regular diet 7/20. Pt initially with poor appetite and oral intake. Pt had decided on comfort care and hospice but had now decided to become full code with full scope of care. Per chart review, pt eating 75-100% of meals in hospital. RD will add supplements and MVI to help pt meet her estimated needs. Pt is at high refeed risk. No new weight since 7/18; will request weekly weights.   Medications reviewed and include: plavix, dexamethasone, lovenox, pepcid, insulin, synthroid  Labs reviewed: K 3.5 wnl P 2.8 wnl, Mg 1.8 wnl- 7/18 Hgb 10.6(L), Hct 33.0(L)  Diet Order:   Diet Order            Diet regular Room service appropriate? Yes; Fluid consistency: Thin  Diet effective now                EDUCATION NEEDS:   Education needs have been addressed  Skin:  Skin Assessment: Reviewed RN Assessment (incision abdomen, Stage II sacrum, VSU L leg)  Last BM:  7/27- type  6 via ostomy  Height:   Ht Readings from Last 1 Encounters:  11/06/19 5\' 9"  (1.753 m)    Weight:   Wt Readings from Last 1 Encounters:  11/06/19 101.8 kg    Ideal Body Weight:  65.9 kg  BMI:  Body mass index is 33.14 kg/m.  Estimated Nutritional Needs:   Kcal:  2100-2400kcal/day  Protein:  110-120g/day  Fluid:  2-2.3L/day  Koleen Distance MS, RD, LDN Please refer to [facility] for RD and/or RD on-call/weekend/after hours pager

## 2019-11-15 NOTE — Progress Notes (Signed)
Hematology/Oncology Progress Note [facility] [phone] [phone]  Patient Care Team: Juline Patch, MD as PCP - General (Family Medicine) Lequita Asal, MD as Referring Physician (Hematology and Oncology) Delana Meyer, Dolores Lory, MD (Vascular Surgery) Noreene Filbert, MD as Radiation Oncologist (Radiation Oncology)   Name of the patient: Kristina Wright  [id number]  March 17, 1956  Date of visit: 11/15/19   INTERVAL HISTORY-  Patient was seen at the bedside. She participated in physical therapy this morning. She has no new complaints.  Mental status is at her baseline Appetite is fair.   Review of systems- Review of Systems  Constitutional: Positive for fatigue. Negative for appetite change.  Respiratory: Negative for shortness of breath.   Gastrointestinal: Negative for abdominal pain.  Genitourinary: Negative for dysuria.   Musculoskeletal: Negative for back pain.  Skin: Negative for rash.  Neurological: Negative for light-headedness and seizures.  Hematological: Negative for adenopathy.  Psychiatric/Behavioral: Negative for confusion.    No Known Allergies  Patient Active Problem List   Diagnosis Date Noted  . Palliative care by specialist   . Advanced care planning/counseling discussion   . DNR (do not resuscitate)   . Vasogenic brain edema ([place])   . Normocytic anemia   . Pressure injury of skin 11/02/2019  . Bowel perforation ([place]) 10/30/2019  . Bilateral lower extremity edema 10/07/2019  . Herpes zoster without complication 25/95/6387  . Port-A-Cath in place 10/01/2019  . Brain metastasis ([place]) 08/05/2019  . Elevated TSH 06/20/2019  . Chronic obstructive pulmonary disease ([place]) 12/13/2018  . Left lower lobe pulmonary nodule 11/29/2018  . Breast nodule 08/25/2018  . Anxiety 08/22/2018  . Genetic testing 06/11/2018  . Family history of breast cancer   . Family history of colon cancer   . Family history of ovarian  cancer   . Family history of stomach cancer   . Family history of throat cancer   . Metastatic adenocarcinoma to lung with unknown primary site [facility]) 04/27/2018  . Depression 03/12/2018  . Primary malignant neoplasm of lung metastatic to other site ([place]) 03/01/2018  . Goals of care, counseling/discussion 03/01/2018  . Tobacco use disorder 02/08/2016  . Essential hypertension 02/08/2016  . PVD (peripheral vascular disease) ([place]) 02/08/2016  . Blue toe syndrome of right lower extremity ([place]) 02/08/2016  . Adult BMI > 30 07/31/2015  . High risk medication use 07/31/2015  . Carpal tunnel syndrome on both sides 10/30/2014  . Postmenopause atrophic vaginitis 05/15/2014  . Sensory urge incontinence 05/15/2014  . Vitamin D deficiency 04/25/2014  . Extremity pain 11/22/2013  . Numbness 11/22/2013  . Sleep disorder 11/22/2013  . Chronic insomnia 11/10/2013  . Foot pain, left 11/10/2013  . Hypothyroidism 11/10/2013  . Mixed hyperlipidemia 11/10/2013     Past Medical History:  Diagnosis Date  . Cancer ([place]) 1989   cervical  . Depression   . Family history of breast cancer   . Family history of colon cancer   . Family history of ovarian cancer   . Family history of stomach cancer   . Family history of throat cancer   . Hyperlipidemia   . Hypertension   . Malignant neoplasm of lung ([place]) 03/01/2018  . Thyroid disease      Past Surgical History:  Procedure Laterality Date  . BILATERAL CARPAL TUNNEL RELEASE    . BOWEL RESECTION N/A 10/30/2019   Procedure: SMALL BOWEL RESECTION;  Surgeon: Benjamine Sprague, DO;  Location: [facility] ORS;  Service: General;  Laterality: N/A;  .  BREAST CYST ASPIRATION Left   . LAPAROTOMY N/A 10/30/2019   Procedure: EXPLORATORY LAPAROTOMY;  Surgeon: Benjamine Sprague, DO;  Location: [facility] ORS;  Service: General;  Laterality: N/A;  . LUNG BIOPSY    . PARTIAL HYSTERECTOMY    . PORTA CATH INSERTION N/A 03/03/2018   Procedure: PORTA CATH INSERTION;  Surgeon: Katha Cabal, MD;  Location: [facility];  Service: Cardiovascular;  Laterality: N/A;  . TUBAL LIGATION      Social History   Socioeconomic History  . Marital status: Single    Spouse name: Not on file  . Number of children: 3  . Years of education: Not on file  . Highest education level: Not on file  Occupational History  . Not on file  Tobacco Use  . Smoking status: Former Smoker    Packs/day: 1.00    Years: 25.00    Pack years: 25.00    Types: Cigarettes    Quit date: 11/19/2017    Years since quitting: 1.9  . Smokeless tobacco: Never Used  Vaping Use  . Vaping Use: Never used  Substance and Sexual Activity  . Alcohol use: No  . Drug use: Yes    Types: Marijuana    Comment: occasional  . Sexual activity: Not on file  Other Topics Concern  . Not on file  Social History Narrative  . Not on file   Social Determinants of Health   Financial Resource Strain:   . Difficulty of Paying Living Expenses:   Food Insecurity:   . Worried About Charity fundraiser in the Last Year:   . Arboriculturist in the Last Year:   Transportation Needs:   . Film/video editor (Medical):   Marland Kitchen Lack of Transportation (Non-Medical):   Physical Activity:   . Days of Exercise per Week:   . Minutes of Exercise per Session:   Stress:   . Feeling of Stress :   Social Connections:   . Frequency of Communication with Friends and Family:   . Frequency of Social Gatherings with Friends and Family:   . Attends Religious Services:   . Active Member of Clubs or Organizations:   . Attends Archivist Meetings:   Marland Kitchen Marital Status:   Intimate Partner Violence:   . Fear of Current or Ex-Partner:   . Emotionally Abused:   Marland Kitchen Physically Abused:   . Sexually Abused:      Family History  Problem Relation Age of Onset  . Colon cancer Mother 8  . Colon cancer Maternal Grandmother 78  . Hypertension Father   . Breast cancer Sister 74  . Throat cancer Brother   . Heart attack  Maternal Aunt   . Ovarian cancer Maternal Aunt 70  . Stomach cancer Paternal Grandmother        dx >50  . Throat cancer Brother      Current Facility-Administered Medications:  .  0.9 %  sodium chloride infusion (Manually program via Guardrails IV Fluids), , Intravenous, Once, Hosie Poisson, MD .  0.9 %  sodium chloride infusion, , Intravenous, PRN, Hosie Poisson, MD, Stopped at 11/07/19 1509 .  acetaminophen (TYLENOL) tablet 1,000 mg, 1,000 mg, Oral, Q6H PRN, Hosie Poisson, MD, 1,000 mg at 11/09/19 2115 .  albuterol (PROVENTIL) (2.5 MG/3ML) 0.083% nebulizer solution 2.5 mg, 2.5 mg, Nebulization, Q6H PRN, Hosie Poisson, MD .  ALPRAZolam Duanne Moron) tablet 0.5 mg, 0.5 mg, Oral, QHS PRN, Mariel Aloe, MD, 0.5 mg at 11/14/19  1946 .  atorvastatin (LIPITOR) tablet 10 mg, 10 mg, Oral, Daily, Nettey, Ralph A, MD .  busPIRone (BUSPAR) tablet 5 mg, 5 mg, Oral, Daily, Hosie Poisson, MD, 5 mg at 11/15/19 0935 .  Chlorhexidine Gluconate Cloth 2 % PADS 6 each, 6 each, Topical, Daily, Hosie Poisson, MD, 6 each at 11/14/19 1000 .  clopidogrel (PLAVIX) tablet 75 mg, 75 mg, Oral, Daily, Hosie Poisson, MD, 75 mg at 11/15/19 0935 .  collagenase (SANTYL) ointment, , Topical, Daily, Sakai, Isami, DO, Given at 11/14/19 1400 .  dexamethasone (DECADRON) tablet 4 mg, 4 mg, Oral, Q8H, Earlie Server, MD, 4 mg at 11/15/19 0507 .  enoxaparin (LOVENOX) injection 40 mg, 40 mg, Subcutaneous, Q24H, Karleen Hampshire, Vijaya, MD, 40 mg at 11/14/19 1946 .  famotidine (PEPCID) tablet 20 mg, 20 mg, Oral, Daily, Earlie Server, MD, 20 mg at 11/15/19 0934 .  FLUoxetine (PROZAC) capsule 10 mg, 10 mg, Oral, Daily, Hosie Poisson, MD, 10 mg at 11/15/19 0935 .  lisinopril (ZESTRIL) tablet 20 mg, 20 mg, Oral, Daily **AND** hydrochlorothiazide (HYDRODIURIL) tablet 25 mg, 25 mg, Oral, Daily, Benn Moulder, RPH .  insulin aspart (novoLOG) injection 0-9 Units, 0-9 Units, Subcutaneous, TID WC, Hosie Poisson, MD, 5 Units at 11/14/19 1222 .   ipratropium-albuterol (DUONEB) 0.5-2.5 (3) MG/3ML nebulizer solution 3 mL, 3 mL, Nebulization, Q6H PRN, Hosie Poisson, MD .  labetalol (NORMODYNE) injection 10 mg, 10 mg, Intravenous, Q2H PRN, Hosie Poisson, MD, 10 mg at 11/10/19 0508 .  levETIRAcetam (KEPPRA) tablet 500 mg, 500 mg, Oral, BID, Hosie Poisson, MD, 500 mg at 11/15/19 0934 .  levothyroxine (SYNTHROID) tablet 112 mcg, 112 mcg, Oral, Daily, Hosie Poisson, MD, 112 mcg at 11/15/19 0507 .  morphine 2 MG/ML injection 1-2 mg, 1-2 mg, Intravenous, Q2H PRN, Hosie Poisson, MD, 2 mg at 11/15/19 0934 .  nystatin cream (MYCOSTATIN), , Topical, BID, Hosie Poisson, MD, Given at 11/14/19 2057 .  osimertinib mesylate (TAGRISSO) tablet 80 mg, 80 mg, Oral, Daily, Mariel Aloe, MD, 80 mg at 11/14/19 1223 .  polyvinyl alcohol (LIQUIFILM TEARS) 1.4 % ophthalmic solution 1 drop, 1 drop, Both Eyes, PRN, Hosie Poisson, MD, 1 drop at 11/14/19 2056 .  sodium chloride flush (NS) 0.9 % injection 10-40 mL, 10-40 mL, Intracatheter, Q12H, Hosie Poisson, MD, 10 mL at 11/15/19 0943 .  sodium chloride flush (NS) 0.9 % injection 10-40 mL, 10-40 mL, Intracatheter, PRN, Hosie Poisson, MD .  sodium hypochlorite (DAKIN'S 1/4 STRENGTH) topical solution, , Irrigation, q morning - 10a, Sakai, Isami, DO .  umeclidinium-vilanterol (ANORO ELLIPTA) 62.5-25 MCG/INH 1 puff, 1 puff, Inhalation, Daily, Hosie Poisson, MD, 1 puff at 11/15/19 0941   Physical exam:  Vitals:   11/14/19 2034 11/15/19 0536 11/15/19 0638 11/15/19 1144  BP: (!) 156/88 (!) 176/96 (!) 169/74 (!) 138/79  Pulse: 89 78 71 91  Resp: 20 20  16   Temp: 98.1 F (36.7 C) 97.9 F (36.6 C)  98.4 F (36.9 C)  TempSrc: Oral Oral  Oral  SpO2: 98% 98%  97%  Weight:      Height:       Physical Exam HENT:     Head: Normocephalic and atraumatic.  Eyes:     General: No scleral icterus.    Pupils: Pupils are equal, round, and reactive to light.  Cardiovascular:     Rate and Rhythm: Normal rate.  Pulmonary:      Effort: Pulmonary effort is normal. No respiratory distress.  Abdominal:     General: There is  no distension.     Palpations: Abdomen is soft.     Comments: Midline wound covered with dressing, colostomy bag  Musculoskeletal:        General: Normal range of motion.  Skin:    General: Skin is warm and dry.  Neurological:     Mental Status: She is alert and oriented to person, place, and time. Mental status is at baseline.  Psychiatric:        Mood and Affect: Mood normal.        CMP Latest Ref Rng & Units 11/14/2019  Glucose 70 - 99 mg/dL 163(H)  BUN 8 - 23 mg/dL 18  Creatinine 0.44 - 1.00 mg/dL 0.73  Sodium 135 - 145 mmol/L 138  Potassium 3.5 - 5.1 mmol/L 3.5  Chloride 98 - 111 mmol/L 102  CO2 22 - 32 mmol/L 26  Calcium 8.9 - 10.3 mg/dL 8.2(L)  Total Protein 6.5 - 8.1 g/dL -  Total Bilirubin 0.3 - 1.2 mg/dL -  Alkaline Phos 38 - 126 U/L -  AST 15 - 41 U/L -  ALT 0 - 44 U/L -   CBC Latest Ref Rng & Units 11/14/2019  WBC 4.0 - 10.5 K/uL 10.4  Hemoglobin 12.0 - 15.0 g/dL 10.6(L)  Hematocrit 36 - 46 % 33.0(L)  Platelets 150 - 400 K/uL 229    RADIOGRAPHIC STUDIES: I have personally reviewed the radiological images as listed and agreed with the findings in the report. DG Abd 1 View  Result Date: 11/04/2019 CLINICAL DATA:  Ileus. EXAM: ABDOMEN - 1 VIEW COMPARISON:  October 30, 2019. FINDINGS: The bowel gas pattern is normal. Ostomy is noted in left lower quadrant. Surgical drain is noted in the pelvis. IMPRESSION: No evidence of bowel obstruction or ileus. Electronically Signed   By: Marijo Conception M.D.   On: 11/04/2019 13:07   DG Abd 1 View  Result Date: 10/30/2019 CLINICAL DATA:  63 year old female with endotracheal and enteric tube placement. EXAM: PORTABLE CHEST AND ABDOMEN 1 VIEW COMPARISON:  CT abdomen pelvis dated 10/30/2019 and chest CT dated 08/09/2019. FINDINGS: An endotracheal tube is seen with tip approximately 3.5 cm above the carina. Enteric tube extends below the  diaphragm with tip and side-port in the left upper abdomen likely in the body of the stomach. Right-sided Port-A-Cath with tip in the region of the cavoatrial junction. There is mild central vascular prominence. Diffuse bilateral interstitial prominence as well as streaky densities in the left lower lung field, new or progressed since the prior CT. Findings may represent combination of vascular congestion/edema and/or pneumonia. A small left pleural effusion may be present. No pneumothorax. The cardiac silhouette is within normal limits. Atherosclerotic calcification of the aorta. Air is noted within the colon. No bowel dilatation. A drainage catheter noted over the pelvis. Midline anterior pelvic wall cutaneous staples. No acute osseous pathology. IMPRESSION: 1. Endotracheal tube above the carina. Enteric tube with tip and side-port in the body of the stomach. 2. Probable mild vascular congestion or edema. Pneumonia is not excluded. Clinical correlation is recommended. Electronically Signed   By: Anner Crete M.D.   On: 10/30/2019 23:47   CT HEAD WO CONTRAST  Result Date: 11/06/2019 CLINICAL DATA:  Lung cancer with brain metastasis. Somnolence today. EXAM: CT HEAD WITHOUT CONTRAST TECHNIQUE: Contiguous axial images were obtained from the base of the skull through the vertex without intravenous contrast. COMPARISON:  MRI head with contrast 09/29/2019 FINDINGS: Brain: Extensive vasogenic edema in the right frontal lobe. Enhancing mass  lesion is seen in the right frontal lobe on the prior MRI. 9 mm midline shift to the left due to mass-effect is similar to the prior MRI. Negative for acute hemorrhage. Chronic ischemia in the left internal capsule unchanged. Vascular: Negative for hyperdense vessel Skull: No skull lesion identified Sinuses/Orbits: Negative Other: None IMPRESSION: Right frontal metastasis with extensive surrounding vasogenic edema. 9 mm midline shift to the left. Findings similar to the prior  MRI. No new acute findings. Electronically Signed   By: Franchot Gallo M.D.   On: 11/06/2019 11:49   MR BRAIN W WO CONTRAST  Result Date: 11/10/2019 CLINICAL DATA:  Intracranial metastatic disease. EXAM: MRI HEAD WITHOUT AND WITH CONTRAST TECHNIQUE: Multiplanar, multiecho pulse sequences of the brain and surrounding structures were obtained without and with intravenous contrast. CONTRAST:  34mL GADAVIST GADOBUTROL 1 MMOL/ML IV SOLN COMPARISON:  Head CT 11/06/2019 and brain MRI 09/29/2019 FINDINGS: Brain: No acute infarct, acute hemorrhage or extra-axial collection. Contrast-enhancing mass in the right frontal lobe measures 4.2 x 2.6 x 3.7 cm. Previously the lesion measured 3.4 x 2.1 x 3.4 cm. Surrounding vasogenic edema is unchanged. There is leftward midline shift that measures 5 mm, unchanged. Normal volume of CSF spaces. No chronic microhemorrhage. There are no new contrast-enhancing lesions Vascular: Normal flow voids. Skull and upper cervical spine: Normal marrow signal. Sinuses/Orbits: Small amount of right mastoid fluid. Paranasal sinuses are clear. Other: None. IMPRESSION: 1. Increased size of right frontal lobe lesion with unchanged surrounding vasogenic edema and 5 mm leftward midline shift. 2. No new lesion. Electronically Signed   By: Ulyses Jarred M.D.   On: 11/10/2019 23:44   CT ABDOMEN PELVIS W CONTRAST  Result Date: 11/08/2019 CLINICAL DATA:  64 year old female with concern for abscess or infection. Status post recent emergent exploratory laparotomy for bowel perforation. EXAM: CT ABDOMEN AND PELVIS WITH CONTRAST TECHNIQUE: Multidetector CT imaging of the abdomen and pelvis was performed using the standard protocol following bolus administration of intravenous contrast. CONTRAST:  145mL OMNIPAQUE IOHEXOL 300 MG/ML  SOLN COMPARISON:  CT abdomen pelvis dated 10/30/2019. FINDINGS: Lower chest: Minimal bibasilar atelectasis. There is a small pocket of extraluminal air in the left upper abdomen,  possibly postoperative. No large pneumoperitoneum. Small free fluid noted within the pelvis. Hepatobiliary: Slight irregularity of the liver contour may represent early changes of cirrhosis. Clinical correlation is recommended. No intrahepatic biliary ductal dilatation. The gallbladder is unremarkable. Pancreas: Unremarkable. No pancreatic ductal dilatation or surrounding inflammatory changes. Spleen: Normal in size without focal abnormality. Adrenals/Urinary Tract: The adrenal glands unremarkable. There is no hydronephrosis on either side. There is symmetric enhancement and excretion of contrast by both kidneys. The visualized ureters and urinary bladder appear unremarkable. Stomach/Bowel: There is postsurgical changes of sigmoid resection with a left lower quadrant colostomy. There is a 2 cm duodenal diverticulum without active inflammatory changes. Oral contrast noted in the small bowel. There is no evidence of bowel obstruction. The appendix is normal. There is a 5.2 x 4.2 cm fluid collection with somewhat surrounding inflammatory changes within the pelvis which may be postoperative. Developing abscess or infection is not excluded. Clinical correlation is recommended. A drainage catheter with tip within the pelvis above the bladder. Vascular/Lymphatic: Advanced aortoiliac atherosclerotic disease. The IVC is unremarkable. No portal venous gas. There is no adenopathy. Reproductive: The uterus and ovaries are grossly unremarkable. Other: Midline vertical anterior pelvic wall surgical incision with open wound and packing material. No drainable fluid collection. There is mild stranding of the subcutaneous soft  tissues of the hips. Musculoskeletal: Mild degenerative changes. No acute osseous pathology. IMPRESSION: 1. Postsurgical changes of sigmoid resection with a left lower quadrant colostomy. 2. A 5.2 x 4.2 cm fluid collection with somewhat surrounding inflammatory changes within the pelvis likely postoperative.  Developing abscess or infection is not excluded. Clinical correlation is recommended. 3. Small pocket of extraluminal air in the left upper abdomen, possibly postoperative. No large pneumoperitoneum. 4. No bowel obstruction. Normal appendix. 5. Aortic Atherosclerosis (ICD10-I70.0). Electronically Signed   By: Anner Crete M.D.   On: 11/08/2019 16:09   CT ABDOMEN PELVIS W CONTRAST  Result Date: 10/30/2019 CLINICAL DATA:  64 year old female with diffuse abdominal and pelvic pain. History of metastatic lung cancer. EXAM: CT ABDOMEN AND PELVIS WITH CONTRAST TECHNIQUE: Multidetector CT imaging of the abdomen and pelvis was performed using the standard protocol following bolus administration of intravenous contrast. CONTRAST:  59mL OMNIPAQUE IOHEXOL 300 MG/ML  SOLN COMPARISON:  08/09/2019 FINDINGS: Lower chest: No acute abnormality. Hepatobiliary: The liver and gallbladder are unremarkable. No biliary dilatation. Pancreas: Unremarkable Spleen: Unremarkable Adrenals/Urinary Tract: The kidneys, adrenal glands and bladder are unremarkable. Stomach/Bowel: A small to moderate amount of pneumoperitoneum is present within the abdomen and pelvis, and centered adjacent to the sigmoid colon, which exhibits mild wall thickening. Colonic diverticulosis is noted. No other bowel abnormalities are identified. The appendix is unremarkable. Vascular/Lymphatic: Aortic atherosclerosis. No enlarged abdominal or pelvic lymph nodes. Reproductive: Uterus and bilateral adnexa are unremarkable. Other: A small amount free fluid in the pelvis is noted. No discrete abscess is present. Musculoskeletal: No acute or suspicious bony abnormalities are noted. IMPRESSION: 1. Bowel perforation with small to moderate amount of pneumoperitoneum within the abdomen and pelvis. Pneumoperitoneum is centered adjacent to mildly thickened sigmoid colon, most likely representing perforation of the sigmoid colon, of uncertain etiology. Small amount of free  fluid in the pelvis. No discrete abscess. 2. Aortic Atherosclerosis (ICD10-I70.0). Critical Value/emergent results were called by telephone at the time of interpretation on 10/30/2019 at 4:40 pm to provider Laban Emperor, who verbally acknowledged these results. Electronically Signed   By: Margarette Canada M.D.   On: 10/30/2019 16:42   DG Chest Port 1 View  Result Date: 10/30/2019 CLINICAL DATA:  64 year old female with endotracheal and enteric tube placement. EXAM: PORTABLE CHEST AND ABDOMEN 1 VIEW COMPARISON:  CT abdomen pelvis dated 10/30/2019 and chest CT dated 08/09/2019. FINDINGS: An endotracheal tube is seen with tip approximately 3.5 cm above the carina. Enteric tube extends below the diaphragm with tip and side-port in the left upper abdomen likely in the body of the stomach. Right-sided Port-A-Cath with tip in the region of the cavoatrial junction. There is mild central vascular prominence. Diffuse bilateral interstitial prominence as well as streaky densities in the left lower lung field, new or progressed since the prior CT. Findings may represent combination of vascular congestion/edema and/or pneumonia. A small left pleural effusion may be present. No pneumothorax. The cardiac silhouette is within normal limits. Atherosclerotic calcification of the aorta. Air is noted within the colon. No bowel dilatation. A drainage catheter noted over the pelvis. Midline anterior pelvic wall cutaneous staples. No acute osseous pathology. IMPRESSION: 1. Endotracheal tube above the carina. Enteric tube with tip and side-port in the body of the stomach. 2. Probable mild vascular congestion or edema. Pneumonia is not excluded. Clinical correlation is recommended. Electronically Signed   By: Anner Crete M.D.   On: 10/30/2019 23:47    Assessment and plan-  Patient is a 65 y.o.  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  #Vasogenic edema secondary to brain mass,-likely due to radiation necrosis versus progression of brain lesion. Continue dexamethasone 4 mg TID.  Given the severity of edema, I will taper her steroids slowly.  Please keep her on current dexamethasone dose and she will follow up with me outpatient next week.  #Stage IV lung cancer with brain metastasis status post radiation. Continue osimertinib.  #Chronic steroid use, anticipate that she will be on a prolonged course of steroid course due to the severe vasogenic edema. I recommend patient to be started on single prophylaxis.  She has an episode of shingles earlier this year. Recommend acyclovir 400 mg twice daily.  Ordered. GI prophylaxis with Pepcid.  Glycemic control. 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  Continue physical therapy  #Goals of care, she is now full code.  She wants to resume full scope of care.  Oral cancer treatments osimertinib has been resumed. Continue PT OT. Discharge to rehab.  Thank you for allowing me to participate in the care of this patient.   Patient will follow up with me outpatient for further discussions.  Earlie Server, MD, PhD Hematology Oncology [facility] Pager- [phone] 11/15/2019

## 2019-11-16 ENCOUNTER — Encounter: Payer: Self-pay | Admitting: Surgery

## 2019-11-16 ENCOUNTER — Inpatient Hospital Stay: Payer: 59

## 2019-11-16 DIAGNOSIS — K567 Ileus, unspecified: Secondary | ICD-10-CM

## 2019-11-16 DIAGNOSIS — Z978 Presence of other specified devices: Secondary | ICD-10-CM

## 2019-11-16 DIAGNOSIS — K631 Perforation of intestine (nontraumatic): Secondary | ICD-10-CM | POA: Diagnosis not present

## 2019-11-16 DIAGNOSIS — Z4659 Encounter for fitting and adjustment of other gastrointestinal appliance and device: Secondary | ICD-10-CM | POA: Diagnosis not present

## 2019-11-16 LAB — CBC
HCT: 31.2 % — ABNORMAL LOW (ref 36.0–46.0)
Hemoglobin: 10.2 g/dL — ABNORMAL LOW (ref 12.0–15.0)
MCH: 30.5 pg (ref 26.0–34.0)
MCHC: 32.7 g/dL (ref 30.0–36.0)
MCV: 93.4 fL (ref 80.0–100.0)
Platelets: 241 10*3/uL (ref 150–400)
RBC: 3.34 MIL/uL — ABNORMAL LOW (ref 3.87–5.11)
RDW: 16.1 % — ABNORMAL HIGH (ref 11.5–15.5)
WBC: 14.5 10*3/uL — ABNORMAL HIGH (ref 4.0–10.5)
nRBC: 1.3 % — ABNORMAL HIGH (ref 0.0–0.2)

## 2019-11-16 LAB — MAGNESIUM: Magnesium: 2.4 mg/dL (ref 1.7–2.4)

## 2019-11-16 LAB — BASIC METABOLIC PANEL
Anion gap: 11 (ref 5–15)
BUN: 26 mg/dL — ABNORMAL HIGH (ref 8–23)
CO2: 25 mmol/L (ref 22–32)
Calcium: 8.6 mg/dL — ABNORMAL LOW (ref 8.9–10.3)
Chloride: 101 mmol/L (ref 98–111)
Creatinine, Ser: 0.6 mg/dL (ref 0.44–1.00)
GFR calc Af Amer: >60 mL/min (ref >60)
GFR calc non Af Amer: >60 mL/min (ref >60)
Glucose, Bld: 126 mg/dL — ABNORMAL HIGH (ref 70–99)
Potassium: 4.3 mmol/L (ref 3.5–5.1)
Sodium: 137 mmol/L (ref 135–145)

## 2019-11-16 LAB — GLUCOSE, CAPILLARY
Glucose-Capillary: 118 mg/dL — ABNORMAL HIGH (ref 70–99)
Glucose-Capillary: 148 mg/dL — ABNORMAL HIGH (ref 70–99)
Glucose-Capillary: 95 mg/dL (ref 70–99)
Glucose-Capillary: 217 mg/dL — ABNORMAL HIGH (ref 70–99)

## 2019-11-16 LAB — PHOSPHORUS: Phosphorus: 3.6 mg/dL (ref 2.5–4.6)

## 2019-11-16 MED ORDER — IOHEXOL 9 MG/ML PO SOLN
500.0000 mL | ORAL | Status: AC
  Administered 2019-11-16: 500 mL via ORAL

## 2019-11-16 MED ORDER — IOHEXOL 300 MG/ML  SOLN
100.0000 mL | Freq: Once | INTRAMUSCULAR | Status: AC | PRN
  Administered 2019-11-16: 100 mL via INTRAVENOUS

## 2019-11-16 NOTE — Plan of Care (Signed)
Continuing with plan of care. 

## 2019-11-16 NOTE — Progress Notes (Signed)
Subjective:  CC: Kristina Wright is a 64 y.o. female  Hospital stay day 17, 17 Days Post-Op Hartman's procedure for perforated diverticulitis  HPI: No acute issues overnight.  ROS:  General: Denies weight loss, weight gain, fatigue, fevers, chills, and night sweats. Heart: Denies chest pain, palpitations, racing heart, irregular heartbeat, leg pain or swelling, and decreased activity tolerance. Respiratory: Denies breathing difficulty, shortness of breath, wheezing, cough, and sputum. GI: Denies change in appetite, heartburn, nausea, vomiting, constipation, diarrhea, and blood in stool. GU: Denies difficulty urinating, pain with urinating, urgency, frequency, blood in urine.   Objective:   Temp:  [97.7 F (36.5 C)-98.4 F (36.9 C)] 97.7 F (36.5 C) (07/28 1156) Pulse Rate:  [75-92] 92 (07/28 1156) Resp:  [14-20] 14 (07/28 1156) BP: (145-159)/(64-86) 145/67 (07/28 1156) SpO2:  [95 %-98 %] 97 % (07/28 1156)     Height: 5\' 9"  (175.3 cm) Weight: 101.8 kg BMI (Calculated): 33.13   Intake/Output this shift:   Intake/Output Summary (Last 24 hours) at 11/16/2019 1331 Last data filed at 11/16/2019 1015 Gross per 24 hour  Intake 240 ml  Output 1950 ml  Net -1710 ml    Constitutional :  alert, cooperative, appears stated age and no distress  Respiratory:  clear to auscultation bilaterally  Cardiovascular:  regular rate and rhythm  Gastrointestinal: .  Soft, no guarding, midline incision packing recently changed. No evidence of expanding erythema or cellulitis from area.  Fascia feels intact, decreased exudative burden, overlying fascia at base, stable. Ostomy bag with gas and stool.  JP drain with scant drainage past 48hrs  Skin: Cool and moist  Psychiatric: Normal affect, non-agitated, not confused       LABS:  CMP Latest Ref Rng & Units 11/16/2019 11/14/2019 11/12/2019  Glucose 70 - 99 mg/dL 126(H) 163(H) 94  BUN 8 - 23 mg/dL 26(H) 18 19  Creatinine 0.44 - 1.00 mg/dL 0.60 0.73 0.69   Sodium 135 - 145 mmol/L 137 138 140  Potassium 3.5 - 5.1 mmol/L 4.3 3.5 3.4(L)  Chloride 98 - 111 mmol/L 101 102 103  CO2 22 - 32 mmol/L 25 26 27   Calcium 8.9 - 10.3 mg/dL 8.6(L) 8.2(L) 8.1(L)  Total Protein 6.5 - 8.1 g/dL - - -  Total Bilirubin 0.3 - 1.2 mg/dL - - -  Alkaline Phos 38 - 126 U/L - - -  AST 15 - 41 U/L - - -  ALT 0 - 44 U/L - - -   CBC Latest Ref Rng & Units 11/16/2019 11/14/2019 11/08/2019  WBC 4.0 - 10.5 K/uL 14.5(H) 10.4 9.9  Hemoglobin 12.0 - 15.0 g/dL 10.2(L) 10.6(L) 9.3(L)  Hematocrit 36 - 46 % 31.2(L) 33.0(L) 29.1(L)  Platelets 150 - 400 K/uL 241 229 163    RADS: Pending CT a/p  Assessment:   Status post Hartman's procedure for perforated diverticulitis, midline wound infection with pseudomonas.   Wound remains clean, will switch to regular wet to dry daily changes at this point.  F/u CT a/p for leukocytosis noted today.  Pt asymptomatic at this point, but will need to see if any change in the fluid collection noted on previous CT as possible source of leukocytosis

## 2019-11-16 NOTE — Progress Notes (Addendum)
PROGRESS NOTE    Kristina Wright  IRW:[id number] DOB: January 23, 1956 DOA: 10/30/2019 PCP: Juline Patch, MD   Brief Narrative: Kristina Wright is a 64 y.o. female with a history of hypothyroidism, stage IV lung cancer with brain metastasis, hypertension, hyperlipidemia, depression, cervical cancer.  Patient presents secondary to diarrhea and abdominal pain.  CT abdomen and pelvis was revealing for a perforated bowel secondary to perforated diverticulitis.  Patient was emergently managed in the OR and underwent exploratory laparotomy with lysis of adhesions, abdominal washout, sigmoid colon resection with colostomy.  Hospitalization was complicated by metabolic encephalopathy secondary to vasogenic edema in setting of metastatic lesions which responded to steroid therapy.  Hospitalization was also complicated by abdominal wound infection managed on IV antibiotics and wound care per general surgery.  Initial plan for discharge home with hospice which has now changed. Patient has decided on full scope treatment and plan is for discharge to SNF with outpatient hematology/oncology follow-up.   Assessment & Plan:   Active Problems:   Malignant neoplasm of lung (HCC)   Bowel perforation (HCC)   Pressure injury of skin   Brain edema (HCC)   Normocytic anemia   Palliative care by specialist   Advanced care planning/counseling discussion   DNR (do not resuscitate)   Immunosuppression due to chronic steroid use   Perforated diverticulitis Patient underwent emergent exploratory laparotomy with lysis of adhesions and sigmoid colon resection with colostomy complicated by midline wound infection.  Postop, patient was managed in ICU while intubated.  She was extubated on 10/31/2019.  Patient was managed on NG tube which was discontinued on 11/02/2019.  Diet has been advanced to regular.  She was started on IV Zosyn for her wound infection.  Repeat CT scan significant for fluid collection which should be evacuated  by current drain in place per general surgery recommendations. -Wound care per general surgery/WOC Wbcs trending up.stat CT ordered by general surgery  Abdominal wound infection Concern for possible Pseudomonas infection.  No cultures obtained.  Patient has been managed on IV Zosyn with recommendations per general surgery to transition to oral antibiotics. No organism identified so patient transitioned to Ciprofloxacin. Patient has been treated for 2 weeks. Discontinued antibiotics.  Acute metabolic encephalopathy Secondary to vasogenic edema from metastatic lesions. Improved with steroid treatment. She was initially managed in ICU and transitioned to comfort measures before mental status drastically improved. Currently at baseline.  Vasogenic edema Secondary to metastatic lesions -Continue Decadron 4 mg TID; continue on discharge  Stage IV lung cancer with metastasis to brain Patient follows with oncology. Started on [place] for seizure prophylaxis. Previously DNR and now full code. Patient is wanting full scope care and treatment options for cancer. Medical oncology consulted and has recommended restarting osimertinib. -Continue osimertinib  Acute blood loss anemia In setting of above. Patient required 1 unit of PRBC. Hemoglobin is stable.  Mouth twitching Unknown etiology. Possibly facial spasms. Resolved spontaneously. Evaluated by neurology who did not think episode was related to seizure activity.  Hypothyroidism -Continue Synthroid  Pressure injury Mid-sacrum, unsure if present on admission   DVT prophylaxis: Lovenox Code Status:   Code Status: Full Code Family Communication: None at bedside Disposition Plan: Discharge to SNF when cleared by general surgery Consultants:   General surgery  Neurology  PCCM  Hematology/Oncology  Palliative care medicine  Procedures:   EXPLORATORY LAPAROTOMY; LYSIS OF ADHESIONS; ABDOMINAL WASHOUT; SIDMOID COLON RESECTION WITH  COLOSTOMY (10/30/2019)  Antimicrobials:  Zosyn (7/11>>7/21)   Ciprofloxacin (7/22>>7/25)  Subjective: Overall she feels better however her white count has been trending up she denies any increased pain nausea or vomiting.  Objective: Vitals:   11/15/19 1144 11/15/19 1918 11/16/19 0611 11/16/19 1156  BP: (!) 138/79 (!) 159/86 (!) 155/64 (!) 145/67  Pulse: 91 81 75 92  Resp: 16 20 16 14   Temp: 98.4 F (36.9 C) 98.4 F (36.9 C) 97.8 F (36.6 C) 97.7 F (36.5 C)  TempSrc: Oral Oral Oral   SpO2: 97% 95% 98% 97%  Weight:      Height:        Intake/Output Summary (Last 24 hours) at 11/16/2019 1505 Last data filed at 11/16/2019 1427 Gross per 24 hour  Intake 480 ml  Output 1950 ml  Net -1470 ml   Filed Weights   10/30/19 1452 10/30/19 2248 11/06/19 1538  Weight: 103 kg 100.6 kg 101.8 kg    Examination:  General exam: Appears calm and comfortable Respiratory system: Clear to auscultation. Respiratory effort normal. Cardiovascular system: S1 & S2 heard, RRR. No murmurs, rubs, gallops or clicks. Gastrointestinal system: Abdomen is distended, soft and nontender. Ostomy with air and brown stool. Abdominal wound dressing is CDI. Normal bowel sounds heard.drain in place Central nervous system: Alert and oriented. No focal neurological deficits. Musculoskeletal: BLE foot edema. No calf tenderness Skin: No cyanosis. No rashes Psychiatry: Judgement and insight appear normal. Mood & affect appropriate.     Data Reviewed: I have personally reviewed following labs and imaging studies  CBC Lab Results  Component Value Date   WBC 14.5 (H) 11/16/2019   RBC 3.34 (L) 11/16/2019   HGB 10.2 (L) 11/16/2019   HCT 31.2 (L) 11/16/2019   MCV 93.4 11/16/2019   MCH 30.5 11/16/2019   PLT 241 11/16/2019   MCHC 32.7 11/16/2019   RDW 16.1 (H) 11/16/2019   LYMPHSABS 1.2 11/08/2019   MONOABS 0.4 11/08/2019   EOSABS 0.0 11/08/2019   BASOSABS 0.0 93/57/0177     Last metabolic panel Lab  Results  Component Value Date   NA 137 11/16/2019   K 4.3 11/16/2019   CL 101 11/16/2019   CO2 25 11/16/2019   BUN 26 (H) 11/16/2019   CREATININE 0.60 11/16/2019   GLUCOSE 126 (H) 11/16/2019   GFRNONAA >60 11/16/2019   GFRAA >60 11/16/2019   CALCIUM 8.6 (L) 11/16/2019   PHOS 3.6 11/16/2019   PROT 6.7 10/30/2019   ALBUMIN 2.2 (L) 10/30/2019   LABGLOB 2.8 08/02/2019   AGRATIO 1.5 08/02/2019   BILITOT 0.9 10/30/2019   ALKPHOS 100 10/30/2019   AST 26 10/30/2019   ALT 43 10/30/2019   ANIONGAP 11 11/16/2019    CBG (last 3)  Recent Labs    11/15/19 2057 11/16/19 0751 11/16/19 1154  GLUCAP 200* 118* 148*     GFR: Estimated Creatinine Clearance: 93.7 mL/min (by C-G formula based on SCr of 0.6 mg/dL).  Coagulation Profile: No results for input(s): INR, PROTIME in the last 168 hours.  No results found for this or any previous visit (from the past 240 hour(s)).      Radiology Studies: No results found.      Scheduled Meds: . sodium chloride   Intravenous Once  . acyclovir  400 mg Oral BID  . atorvastatin  10 mg Oral Daily  . busPIRone  5 mg Oral Daily  . Chlorhexidine Gluconate Cloth  6 each Topical Daily  . clopidogrel  75 mg Oral Daily  . collagenase   Topical Daily  . dexamethasone  4  mg Oral Q8H  . enoxaparin (LOVENOX) injection  40 mg Subcutaneous Q24H  . famotidine  20 mg Oral Daily  . feeding supplement (ENSURE ENLIVE)  237 mL Oral TID BM  . FLUoxetine  10 mg Oral Daily  . lisinopril  20 mg Oral Daily   And  . hydrochlorothiazide  25 mg Oral Daily  . insulin aspart  0-9 Units Subcutaneous TID WC  . iohexol  500 mL Oral Q1 Hr x 2  . levETIRAcetam  500 mg Oral BID  . levothyroxine  112 mcg Oral Daily  . multivitamin with minerals  1 tablet Oral Daily  . nystatin cream   Topical BID  . osimertinib mesylate  80 mg Oral Daily  . sodium chloride flush  10-40 mL Intracatheter Q12H  . sodium hypochlorite   Irrigation q morning - 10a  .  umeclidinium-vilanterol  1 puff Inhalation Daily   Continuous Infusions: . sodium chloride Stopped (11/07/19 1509)     LOS: 17 days   Landis Gandy md 11/16/2019, 3:05 PM  If 7PM-7AM, please contact night-coverage [email or web address]

## 2019-11-16 NOTE — Progress Notes (Signed)
Patient refused all dressing changes today and stated they were done right before shift change today so she didn't feel they warranted being changed.  Dr. Lysle Pearl updated as well.

## 2019-11-17 ENCOUNTER — Inpatient Hospital Stay: Payer: 59

## 2019-11-17 DIAGNOSIS — Z4659 Encounter for fitting and adjustment of other gastrointestinal appliance and device: Secondary | ICD-10-CM | POA: Diagnosis not present

## 2019-11-17 DIAGNOSIS — Z978 Presence of other specified devices: Secondary | ICD-10-CM | POA: Diagnosis not present

## 2019-11-17 DIAGNOSIS — K631 Perforation of intestine (nontraumatic): Secondary | ICD-10-CM | POA: Diagnosis not present

## 2019-11-17 DIAGNOSIS — K567 Ileus, unspecified: Secondary | ICD-10-CM | POA: Diagnosis not present

## 2019-11-17 LAB — CBC
HCT: 31 % — ABNORMAL LOW (ref 36.0–46.0)
Hemoglobin: 9.8 g/dL — ABNORMAL LOW (ref 12.0–15.0)
MCH: 30.8 pg (ref 26.0–34.0)
MCHC: 31.6 g/dL (ref 30.0–36.0)
MCV: 97.5 fL (ref 80.0–100.0)
Platelets: 242 10*3/uL (ref 150–400)
RBC: 3.18 MIL/uL — ABNORMAL LOW (ref 3.87–5.11)
RDW: 16.3 % — ABNORMAL HIGH (ref 11.5–15.5)
WBC: 16.7 10*3/uL — ABNORMAL HIGH (ref 4.0–10.5)
nRBC: 0.8 % — ABNORMAL HIGH (ref 0.0–0.2)

## 2019-11-17 LAB — BLOOD GAS, ARTERIAL
Acid-base deficit: 7.9 mmol/L — ABNORMAL HIGH (ref 0.0–2.0)
Bicarbonate: 17.6 mmol/L — ABNORMAL LOW (ref 20.0–28.0)
O2 Saturation: 98.7 %
Patient temperature: 37
pCO2 arterial: 35 mmHg (ref 32.0–48.0)
pH, Arterial: 7.31 — ABNORMAL LOW (ref 7.350–7.450)
pO2, Arterial: 130 mmHg — ABNORMAL HIGH (ref 83.0–108.0)

## 2019-11-17 LAB — GLUCOSE, CAPILLARY
Glucose-Capillary: 112 mg/dL — ABNORMAL HIGH (ref 70–99)
Glucose-Capillary: 110 mg/dL — ABNORMAL HIGH (ref 70–99)
Glucose-Capillary: 97 mg/dL (ref 70–99)
Glucose-Capillary: 156 mg/dL — ABNORMAL HIGH (ref 70–99)

## 2019-11-17 MED ORDER — ENOXAPARIN SODIUM 40 MG/0.4ML ~~LOC~~ SOLN
40.0000 mg | SUBCUTANEOUS | Status: DC
  Administered 2019-11-18 – 2019-11-20 (×3): 40 mg via SUBCUTANEOUS
  Filled 2019-11-17 (×3): qty 0.4

## 2019-11-17 MED ORDER — MIDAZOLAM HCL 2 MG/2ML IJ SOLN
INTRAMUSCULAR | Status: DC | PRN
  Administered 2019-11-17: 1 mg via INTRAVENOUS

## 2019-11-17 MED ORDER — MIDAZOLAM HCL 2 MG/2ML IJ SOLN
INTRAMUSCULAR | Status: AC
  Filled 2019-11-17: qty 2

## 2019-11-17 MED ORDER — FENTANYL CITRATE (PF) 100 MCG/2ML IJ SOLN
INTRAMUSCULAR | Status: DC | PRN
  Administered 2019-11-17 (×2): 50 ug via INTRAVENOUS

## 2019-11-17 MED ORDER — SODIUM CHLORIDE 0.9% FLUSH
5.0000 mL | Freq: Three times a day (TID) | INTRAVENOUS | Status: DC
  Administered 2019-11-17 – 2019-11-21 (×12): 5 mL

## 2019-11-17 MED ORDER — FENTANYL CITRATE (PF) 100 MCG/2ML IJ SOLN
INTRAMUSCULAR | Status: AC
  Filled 2019-11-17: qty 2

## 2019-11-17 MED ORDER — MIDAZOLAM HCL 5 MG/5ML IJ SOLN
INTRAMUSCULAR | Status: DC | PRN
  Administered 2019-11-17: 1 mg via INTRAVENOUS

## 2019-11-17 NOTE — Consult Note (Signed)
Chief Complaint: Postop pelvic fluid collection  Referring Physician(s): Lysle Pearl   Patient Status: [place] - In-pt  History of Present Illness: Kristina Wright is a 64 y.o. female with past medical history significant for peripheral diverticulitis day 17 following Hartman's procedure found to have a slightly enlarging fluid collection within pelvic cul-de-sac on abdominal CT of the abdomen and pelvis performed on 11/16/2019 for evaluation of leukocytosis.  Request made by the surgical service for attempted CT-guided aspiration and/or drainage catheter placement for infection source control purposes.  Past Medical History:  Diagnosis Date  . Cancer ([place]) 1989   cervical  . Depression   . Family history of breast cancer   . Family history of colon cancer   . Family history of ovarian cancer   . Family history of stomach cancer   . Family history of throat cancer   . Hyperlipidemia   . Hypertension   . Malignant neoplasm of lung ([place]) 03/01/2018  . Thyroid disease     Past Surgical History:  Procedure Laterality Date  . BILATERAL CARPAL TUNNEL RELEASE    . BOWEL RESECTION N/A 10/30/2019   Procedure: SMALL BOWEL RESECTION;  Surgeon: Benjamine Sprague, DO;  Location: [facility] ORS;  Service: General;  Laterality: N/A;  . BREAST CYST ASPIRATION Left   . LAPAROTOMY N/A 10/30/2019   Procedure: EXPLORATORY LAPAROTOMY;  Surgeon: Benjamine Sprague, DO;  Location: [facility] ORS;  Service: General;  Laterality: N/A;  . LUNG BIOPSY    . PARTIAL HYSTERECTOMY    . PORTA CATH INSERTION N/A 03/03/2018   Procedure: PORTA CATH INSERTION;  Surgeon: Katha Cabal, MD;  Location: [facility];  Service: Cardiovascular;  Laterality: N/A;  . TUBAL LIGATION      Allergies: Patient has no known allergies.  Medications: Prior to Admission medications   Medication Sig Start Date End Date Taking? Authorizing Provider  ALPRAZolam (XANAX) 0.25 MG tablet Take 1 tablet (0.25 mg total) by mouth at bedtime as  needed. for anxiety AS NEEDED ONLY! 09/23/19  Yes Juline Patch, MD  aspirin EC 81 MG tablet Take 81 mg by mouth daily.   Yes [provider]  atorvastatin (LIPITOR) 10 MG tablet Take 1 tablet (10 mg total) by mouth daily. 08/01/19  Yes Juline Patch, MD  busPIRone (BUSPAR) 5 MG tablet Take 1 tablet (5 mg total) by mouth daily. 08/01/19  Yes Juline Patch, MD  clopidogrel (PLAVIX) 75 MG tablet Take 1 tablet (75 mg total) by mouth daily. 09/16/19  Yes Juline Patch, MD  dexamethasone (DECADRON) 1 MG tablet Take 2 tablets (2 mg total) by mouth as directed. Take 2 tablets (2mg  total) by mouth 2 times a day for 1 week , then 2 tabs (2mg ) daily for 1 week 10/20/19  Yes Earlie Server, MD  FLUoxetine (PROZAC) 10 MG capsule Take 1 capsule (10 mg total) by mouth daily. 08/01/19  Yes Juline Patch, MD  furosemide (LASIX) 20 MG tablet Take 1 tablet (20 mg total) by mouth 2 (two) times daily. 10/10/19  Yes Juline Patch, MD  levETIRAcetam (KEPPRA) 500 MG tablet Take 1 tablet (500 mg total) by mouth 2 (two) times daily. 09/30/19  Yes Earlie Server, MD  levothyroxine (SYNTHROID) 112 MCG tablet Take 1 tablet (112 mcg total) by mouth daily. 08/01/19  Yes Juline Patch, MD  lidocaine (XYLOCAINE) 2 % solution Use as directed 15 mLs in the mouth or throat as needed for mouth pain. 09/15/19  Yes Burns,  Wandra Feinstein, NP  lidocaine-prilocaine (EMLA) cream Apply 1 application topically as needed.   Yes [provider]  lisinopril-hydrochlorothiazide (ZESTORETIC) 20-25 MG tablet Take 1 tablet by mouth daily. 09/01/19  Yes Juline Patch, MD  Multiple Vitamins-Minerals (MULTIVITAMIN ADULT PO) Take 1 tablet by mouth daily.    Yes [provider]  mupirocin ointment (BACTROBAN) 2 % Apply 1 application topically 2 (two) times daily. Patient taking differently: Apply 1 application topically 2 (two) times daily. (apply to lower legs and feet) 10/10/19  Yes Juline Patch, MD  nystatin cream (MYCOSTATIN) Apply 1  application topically 2 (two) times daily. Patient taking differently: Apply 1 application topically 2 (two) times daily. (apply to lower legs and feet) 10/10/19  Yes Earlie Server, MD  Olopatadine HCl 0.2 % SOLN Apply 1 drop to eye in the morning and at bedtime. 09/26/19  Yes Juline Patch, MD  omeprazole (PRILOSEC) 20 MG capsule Take 1 capsule (20 mg total) by mouth daily. 10/07/19  Yes Earlie Server, MD  osimertinib mesylate (TAGRISSO) 80 MG tablet Take 1 tablet (80 mg total) by mouth daily. 09/15/19  Yes Corcoran, Drue Second, MD  oxyCODONE (OXY IR/ROXICODONE) 5 MG immediate release tablet Take 1 tablet (5 mg total) by mouth every 6 (six) hours as needed for severe pain. 10/19/19  Yes Burns, Wandra Feinstein, NP  umeclidinium-vilanterol (ANORO ELLIPTA) 62.5-25 MCG/INH AEPB Inhale 1 puff into the lungs daily. 08/01/19  Yes Juline Patch, MD  valACYclovir (VALTREX) 1000 MG tablet Take 1 tablet (1,000 mg total) by mouth 3 (three) times daily. 09/08/19  Yes Corcoran, Drue Second, MD  VENTOLIN HFA 108 (90 Base) MCG/ACT inhaler Inhale 1-2 puffs into the lungs every 6 (six) hours as needed for wheezing or shortness of breath. 08/01/19  Yes Juline Patch, MD  prochlorperazine (COMPAZINE) 10 MG tablet Take 1 tablet (10 mg total) by mouth every 6 (six) hours as needed (Nausea or vomiting). Patient not taking: Reported on 03/03/2019 03/01/18 04/25/19  Earlie Server, MD     Family History  Problem Relation Age of Onset  . Colon cancer Mother 36  . Colon cancer Maternal Grandmother 78  . Hypertension Father   . Breast cancer Sister 72  . Throat cancer Brother   . Heart attack Maternal Aunt   . Ovarian cancer Maternal Aunt 70  . Stomach cancer Paternal Grandmother        dx >50  . Throat cancer Brother     Social History   Socioeconomic History  . Marital status: Single    Spouse name: Not on file  . Number of children: 3  . Years of education: Not on file  . Highest education level: Not on file  Occupational History  .  Not on file  Tobacco Use  . Smoking status: Former Smoker    Packs/day: 1.00    Years: 25.00    Pack years: 25.00    Types: Cigarettes    Quit date: 11/19/2017    Years since quitting: 1.9  . Smokeless tobacco: Never Used  Vaping Use  . Vaping Use: Never used  Substance and Sexual Activity  . Alcohol use: No  . Drug use: Yes    Types: Marijuana    Comment: occasional  . Sexual activity: Not on file  Other Topics Concern  . Not on file  Social History Narrative  . Not on file   Social Determinants of Health   Financial Resource Strain:   . Difficulty of  Paying Living Expenses:   Food Insecurity:   . Worried About Charity fundraiser in the Last Year:   . Arboriculturist in the Last Year:   Transportation Needs:   . Film/video editor (Medical):   Marland Kitchen Lack of Transportation (Non-Medical):   Physical Activity:   . Days of Exercise per Week:   . Minutes of Exercise per Session:   Stress:   . Feeling of Stress :   Social Connections:   . Frequency of Communication with Friends and Family:   . Frequency of Social Gatherings with Friends and Family:   . Attends Religious Services:   . Active Member of Clubs or Organizations:   . Attends Archivist Meetings:   Marland Kitchen Marital Status:     ECOG Status: 2 - Symptomatic, <50% confined to bed  Review of Systems: A 12 point ROS discussed and pertinent positives are indicated in the HPI above.  All other systems are negative.  Review of Systems  Vital Signs: BP (!) 100/63 (BP Location: Right Arm)   Pulse 83   Temp (!) 97.3 F (36.3 C) (Oral)   Resp 16   Ht 5\' 9"  (1.753 m)   Wt 101.8 kg   SpO2 96%   BMI 33.14 kg/m   Physical Exam  Imaging: DG Abd 1 View  Result Date: 11/04/2019 CLINICAL DATA:  Ileus. EXAM: ABDOMEN - 1 VIEW COMPARISON:  October 30, 2019. FINDINGS: The bowel gas pattern is normal. Ostomy is noted in left lower quadrant. Surgical drain is noted in the pelvis. IMPRESSION: No evidence of bowel  obstruction or ileus. Electronically Signed   By: Marijo Conception M.D.   On: 11/04/2019 13:07   DG Abd 1 View  Result Date: 10/30/2019 CLINICAL DATA:  64 year old female with endotracheal and enteric tube placement. EXAM: PORTABLE CHEST AND ABDOMEN 1 VIEW COMPARISON:  CT abdomen pelvis dated 10/30/2019 and chest CT dated 08/09/2019. FINDINGS: An endotracheal tube is seen with tip approximately 3.5 cm above the carina. Enteric tube extends below the diaphragm with tip and side-port in the left upper abdomen likely in the body of the stomach. Right-sided Port-A-Cath with tip in the region of the cavoatrial junction. There is mild central vascular prominence. Diffuse bilateral interstitial prominence as well as streaky densities in the left lower lung field, new or progressed since the prior CT. Findings may represent combination of vascular congestion/edema and/or pneumonia. A small left pleural effusion may be present. No pneumothorax. The cardiac silhouette is within normal limits. Atherosclerotic calcification of the aorta. Air is noted within the colon. No bowel dilatation. A drainage catheter noted over the pelvis. Midline anterior pelvic wall cutaneous staples. No acute osseous pathology. IMPRESSION: 1. Endotracheal tube above the carina. Enteric tube with tip and side-port in the body of the stomach. 2. Probable mild vascular congestion or edema. Pneumonia is not excluded. Clinical correlation is recommended. Electronically Signed   By: Anner Crete M.D.   On: 10/30/2019 23:47   CT HEAD WO CONTRAST  Result Date: 11/06/2019 CLINICAL DATA:  Lung cancer with brain metastasis. Somnolence today. EXAM: CT HEAD WITHOUT CONTRAST TECHNIQUE: Contiguous axial images were obtained from the base of the skull through the vertex without intravenous contrast. COMPARISON:  MRI head with contrast 09/29/2019 FINDINGS: Brain: Extensive vasogenic edema in the right frontal lobe. Enhancing mass lesion is seen in the  right frontal lobe on the prior MRI. 9 mm midline shift to the left due to mass-effect is  similar to the prior MRI. Negative for acute hemorrhage. Chronic ischemia in the left internal capsule unchanged. Vascular: Negative for hyperdense vessel Skull: No skull lesion identified Sinuses/Orbits: Negative Other: None IMPRESSION: Right frontal metastasis with extensive surrounding vasogenic edema. 9 mm midline shift to the left. Findings similar to the prior MRI. No new acute findings. Electronically Signed   By: Franchot Gallo M.D.   On: 11/06/2019 11:49   MR BRAIN W WO CONTRAST  Result Date: 11/10/2019 CLINICAL DATA:  Intracranial metastatic disease. EXAM: MRI HEAD WITHOUT AND WITH CONTRAST TECHNIQUE: Multiplanar, multiecho pulse sequences of the brain and surrounding structures were obtained without and with intravenous contrast. CONTRAST:  71mL GADAVIST GADOBUTROL 1 MMOL/ML IV SOLN COMPARISON:  Head CT 11/06/2019 and brain MRI 09/29/2019 FINDINGS: Brain: No acute infarct, acute hemorrhage or extra-axial collection. Contrast-enhancing mass in the right frontal lobe measures 4.2 x 2.6 x 3.7 cm. Previously the lesion measured 3.4 x 2.1 x 3.4 cm. Surrounding vasogenic edema is unchanged. There is leftward midline shift that measures 5 mm, unchanged. Normal volume of CSF spaces. No chronic microhemorrhage. There are no new contrast-enhancing lesions Vascular: Normal flow voids. Skull and upper cervical spine: Normal marrow signal. Sinuses/Orbits: Small amount of right mastoid fluid. Paranasal sinuses are clear. Other: None. IMPRESSION: 1. Increased size of right frontal lobe lesion with unchanged surrounding vasogenic edema and 5 mm leftward midline shift. 2. No new lesion. Electronically Signed   By: Ulyses Jarred M.D.   On: 11/10/2019 23:44   CT ABDOMEN PELVIS W CONTRAST  Result Date: 11/16/2019 CLINICAL DATA:  64 year old female with concern for abdominal infection. EXAM: CT ABDOMEN AND PELVIS WITH CONTRAST  TECHNIQUE: Multidetector CT imaging of the abdomen and pelvis was performed using the standard protocol following bolus administration of intravenous contrast. CONTRAST:  122mL OMNIPAQUE IOHEXOL 300 MG/ML  SOLN COMPARISON:  CT abdomen pelvis dated 11/08/2019. FINDINGS: Lower chest: Left lung base linear and streaky atelectasis. There is coronary vascular calcification. Partially visualized tip of a central venous line at the cavoatrial junction. No intra-abdominal free air. Small free fluid in the pelvis. Hepatobiliary: There is mild irregularity of the liver contour concerning for early changes of cirrhosis. Clinical correlation is recommended. No intrahepatic biliary ductal dilatation. The gallbladder is unremarkable. Pancreas: Unremarkable. No pancreatic ductal dilatation or surrounding inflammatory changes. Spleen: Normal in size without focal abnormality. Adrenals/Urinary Tract: The adrenal glands unremarkable. There is no hydronephrosis on either side. There is symmetric enhancement and excretion of contrast by both kidneys. The visualized ureters and urinary bladder appear unremarkable. Stomach/Bowel: There is postsurgical changes of sigmoid colon resection with a left lower quadrant colostomy. There is no bowel obstruction or active inflammation. There is a 2 cm duodenal diverticulum. The appendix is normal. Vascular/Lymphatic: Moderate aortoiliac atherosclerotic disease. The IVC is unremarkable. No portal venous gas. There is no adenopathy. Reproductive: The uterus is grossly unremarkable. The right ovary is not visualized with certainty. There is a 2.2 x 1.9 cm hypodense lesion with a focus of fat and calcium in the left ovary most consistent with a teratoma. Further evaluation with ultrasound on a nonemergent/outpatient basis recommended. Other: There is a 4 x 7 cm probably partially loculated fluid within the pelvis, similar or minimally increased since the prior CT. There is a focal area of fat  stranding in the left hemipelvis superiorly measuring 3.3 x 2.5 cm (59/2) adjacent to the surgical suture of the blind end of the Hartmann's pouch which appears similar to prior CT, likely postsurgical.  A drainage catheter again noted with tip anterior to the uterine fundus. The catheter does not contact the fluid collection in the posterior pelvis. Consider re-evaluation and repositioning. Musculoskeletal: Midline vertical anterior pelvic wall large open wound with packing material. No acute osseous pathology. IMPRESSION: 1. Postsurgical changes of sigmoid colon resection with a left lower quadrant colostomy. No bowel obstruction. Normal appendix. 2. Persistent fluid collection in the posterior pelvis similar or minimally increased since the prior CT. The drainage catheter does not contact the fluid collection in the posterior pelvis. Consider re-evaluation and repositioning. 3. A 2.2 x 1.9 cm left ovarian teratoma. Further evaluation with ultrasound on a nonemergent/outpatient basis recommended. 4. Aortic Atherosclerosis (ICD10-I70.0). Electronically Signed   By: Anner Crete M.D.   On: 11/16/2019 16:21   CT ABDOMEN PELVIS W CONTRAST  Result Date: 11/08/2019 CLINICAL DATA:  64 year old female with concern for abscess or infection. Status post recent emergent exploratory laparotomy for bowel perforation. EXAM: CT ABDOMEN AND PELVIS WITH CONTRAST TECHNIQUE: Multidetector CT imaging of the abdomen and pelvis was performed using the standard protocol following bolus administration of intravenous contrast. CONTRAST:  116mL OMNIPAQUE IOHEXOL 300 MG/ML  SOLN COMPARISON:  CT abdomen pelvis dated 10/30/2019. FINDINGS: Lower chest: Minimal bibasilar atelectasis. There is a small pocket of extraluminal air in the left upper abdomen, possibly postoperative. No large pneumoperitoneum. Small free fluid noted within the pelvis. Hepatobiliary: Slight irregularity of the liver contour may represent early changes of  cirrhosis. Clinical correlation is recommended. No intrahepatic biliary ductal dilatation. The gallbladder is unremarkable. Pancreas: Unremarkable. No pancreatic ductal dilatation or surrounding inflammatory changes. Spleen: Normal in size without focal abnormality. Adrenals/Urinary Tract: The adrenal glands unremarkable. There is no hydronephrosis on either side. There is symmetric enhancement and excretion of contrast by both kidneys. The visualized ureters and urinary bladder appear unremarkable. Stomach/Bowel: There is postsurgical changes of sigmoid resection with a left lower quadrant colostomy. There is a 2 cm duodenal diverticulum without active inflammatory changes. Oral contrast noted in the small bowel. There is no evidence of bowel obstruction. The appendix is normal. There is a 5.2 x 4.2 cm fluid collection with somewhat surrounding inflammatory changes within the pelvis which may be postoperative. Developing abscess or infection is not excluded. Clinical correlation is recommended. A drainage catheter with tip within the pelvis above the bladder. Vascular/Lymphatic: Advanced aortoiliac atherosclerotic disease. The IVC is unremarkable. No portal venous gas. There is no adenopathy. Reproductive: The uterus and ovaries are grossly unremarkable. Other: Midline vertical anterior pelvic wall surgical incision with open wound and packing material. No drainable fluid collection. There is mild stranding of the subcutaneous soft tissues of the hips. Musculoskeletal: Mild degenerative changes. No acute osseous pathology. IMPRESSION: 1. Postsurgical changes of sigmoid resection with a left lower quadrant colostomy. 2. A 5.2 x 4.2 cm fluid collection with somewhat surrounding inflammatory changes within the pelvis likely postoperative. Developing abscess or infection is not excluded. Clinical correlation is recommended. 3. Small pocket of extraluminal air in the left upper abdomen, possibly postoperative. No large  pneumoperitoneum. 4. No bowel obstruction. Normal appendix. 5. Aortic Atherosclerosis (ICD10-I70.0). Electronically Signed   By: Anner Crete M.D.   On: 11/08/2019 16:09   CT ABDOMEN PELVIS W CONTRAST  Result Date: 10/30/2019 CLINICAL DATA:  64 year old female with diffuse abdominal and pelvic pain. History of metastatic lung cancer. EXAM: CT ABDOMEN AND PELVIS WITH CONTRAST TECHNIQUE: Multidetector CT imaging of the abdomen and pelvis was performed using the standard protocol following bolus administration of intravenous  contrast. CONTRAST:  82mL OMNIPAQUE IOHEXOL 300 MG/ML  SOLN COMPARISON:  08/09/2019 FINDINGS: Lower chest: No acute abnormality. Hepatobiliary: The liver and gallbladder are unremarkable. No biliary dilatation. Pancreas: Unremarkable Spleen: Unremarkable Adrenals/Urinary Tract: The kidneys, adrenal glands and bladder are unremarkable. Stomach/Bowel: A small to moderate amount of pneumoperitoneum is present within the abdomen and pelvis, and centered adjacent to the sigmoid colon, which exhibits mild wall thickening. Colonic diverticulosis is noted. No other bowel abnormalities are identified. The appendix is unremarkable. Vascular/Lymphatic: Aortic atherosclerosis. No enlarged abdominal or pelvic lymph nodes. Reproductive: Uterus and bilateral adnexa are unremarkable. Other: A small amount free fluid in the pelvis is noted. No discrete abscess is present. Musculoskeletal: No acute or suspicious bony abnormalities are noted. IMPRESSION: 1. Bowel perforation with small to moderate amount of pneumoperitoneum within the abdomen and pelvis. Pneumoperitoneum is centered adjacent to mildly thickened sigmoid colon, most likely representing perforation of the sigmoid colon, of uncertain etiology. Small amount of free fluid in the pelvis. No discrete abscess. 2. Aortic Atherosclerosis (ICD10-I70.0). Critical Value/emergent results were called by telephone at the time of interpretation on 10/30/2019 at  4:40 pm to provider Laban Emperor, who verbally acknowledged these results. Electronically Signed   By: Margarette Canada M.D.   On: 10/30/2019 16:42   DG Chest Port 1 View  Result Date: 10/30/2019 CLINICAL DATA:  64 year old female with endotracheal and enteric tube placement. EXAM: PORTABLE CHEST AND ABDOMEN 1 VIEW COMPARISON:  CT abdomen pelvis dated 10/30/2019 and chest CT dated 08/09/2019. FINDINGS: An endotracheal tube is seen with tip approximately 3.5 cm above the carina. Enteric tube extends below the diaphragm with tip and side-port in the left upper abdomen likely in the body of the stomach. Right-sided Port-A-Cath with tip in the region of the cavoatrial junction. There is mild central vascular prominence. Diffuse bilateral interstitial prominence as well as streaky densities in the left lower lung field, new or progressed since the prior CT. Findings may represent combination of vascular congestion/edema and/or pneumonia. A small left pleural effusion may be present. No pneumothorax. The cardiac silhouette is within normal limits. Atherosclerotic calcification of the aorta. Air is noted within the colon. No bowel dilatation. A drainage catheter noted over the pelvis. Midline anterior pelvic wall cutaneous staples. No acute osseous pathology. IMPRESSION: 1. Endotracheal tube above the carina. Enteric tube with tip and side-port in the body of the stomach. 2. Probable mild vascular congestion or edema. Pneumonia is not excluded. Clinical correlation is recommended. Electronically Signed   By: Anner Crete M.D.   On: 10/30/2019 23:47    Labs:  CBC: Recent Labs    11/08/19 1449 11/14/19 1304 11/16/19 0439 11/17/19 0508  WBC 9.9 10.4 14.5* 16.7*  HGB 9.3* 10.6* 10.2* 9.8*  HCT 29.1* 33.0* 31.2* 31.0*  PLT 163 229 241 242    COAGS: Recent Labs    08/03/19 1247  INR 1.0  APTT 32    BMP: Recent Labs    11/08/19 1449 11/12/19 0634 11/14/19 1304 11/16/19 0439  NA 139 140 138 137   K 3.2* 3.4* 3.5 4.3  CL 103 103 102 101  CO2 28 27 26 25   GLUCOSE 156* 94 163* 126*  BUN 13 19 18  26*  CALCIUM 8.1* 8.1* 8.2* 8.6*  CREATININE 0.83 0.69 0.73 0.60  GFRNONAA >60 >60 >60 >60  GFRAA >60 >60 >60 >60    LIVER FUNCTION TESTS: Recent Labs    09/22/19 1112 10/07/19 1251 10/20/19 1057 10/30/19 1508  BILITOT 0.4 0.5  0.6 0.9  AST 25 18 17 26   ALT 38 25 29 43  ALKPHOS 60 68 70 100  PROT 6.3* 6.8 6.9 6.7  ALBUMIN 3.4* 3.4* 3.3* 2.2*    TUMOR MARKERS: No results for input(s): AFPTM, CEA, CA199, CHROMGRNA in the last 8760 hours.  Assessment and Plan:  Kristina Wright is a 64 y.o. female with past medical history significant for peripheral diverticulitis day 17 following Hartman's procedure found to have a slightly enlarging fluid collection within pelvic cul-de-sac on abdominal CT of the abdomen and pelvis performed on 11/16/2019 for evaluation of leukocytosis.  Request made by the surgical service for attempted CT-guided aspiration and/or drainage catheter placement for infection source control purposes.  Risks and benefits of transgluteal approach pelvic fluid collection aspiration and/or drainage catheter placement was discussed with the patient including bleeding, infection, damage to adjacent structures, bowel perforation/fistula connection, and sepsis.  All of the patient's questions were answered, patient is agreeable to proceed. Consent signed and in chart.  Thank you for this interesting consult.  I greatly enjoyed meeting Kristina Wright and look forward to participating in their care.  A copy of this report was sent to the requesting provider on this date.  Electronically Signed: Sandi Mariscal, MD 11/17/2019, 2:07 PM   I spent a total of 20 Minutes in face to face in clinical consultation, greater than 50% of which was counseling/coordinating care for postoperative pelvic fluid collection.

## 2019-11-17 NOTE — TOC Progression Note (Signed)
Transition of Care [facility]) - Progression Note    Patient Details  Name: ANNALEESE GUIER MRN: [id number] Date of Birth: [birth year]  Transition of Care [facility]) CM/SW Contact  Beverly Sessions, RN Phone Number: 11/17/2019, 4:09 PM  Clinical Narrative:    Received call from Hilda Blades at [facility] stating that she notified CSW yesterday that insurance [place] had been approved.   RNCM followed up with MD who states that patient will not be ready today or tomorrow, that it may be the beginning of next week.  RNCM updated Debra at [facility], and Santiago Glad with [employer]    Expected Discharge Plan: [place] Barriers to Discharge: Continued Medical Work up  Expected Discharge Plan and Services Expected Discharge Plan: [place] Choice: [place] arrangements for the past 2 months: Single Family Home                           HH Arranged: RN, PT, OT, Nurse's Aide, Social Work [employer] Agency: [place] ([place]) Date [place]: 11/04/19   Representative spoke with at [place]: [place]) Interventions    Readmission Risk Interventions Readmission Risk Prevention Plan 11/04/2019  PCP or Specialist Appt within 3-5 Days Complete  HRI or [place] Complete  Social Work Consult for [place] Planning/Counseling Complete  Palliative Care Screening Not Applicable  Some recent data might be hidden

## 2019-11-17 NOTE — Progress Notes (Signed)
Occupational Therapy Treatment Patient Details Name: Kristina Wright MRN: [id number] DOB: 09/19/1955 Today's Date: 11/17/2019    History of present illness 64 yo female admitted with generalized weakness and diarrhea. Found to have bowel perforation s/p exploratory laparotomy requiring bowel resection along with colostomy and remained mechanically intubated postop. Patient extubated 10/31/19. History of lung cancer with brain mets, COPD, bilateral LE wounds    OT comments  Kristina Wright is making good progress towards her functional goals.  She continues to be limited by decreased endurance, generalized weakness, and pain in ability to complete functional tasks.  Pt reported pain in abdomen and B feet but denied further interventions and was motivated to participate in today's session.  OTR facilitated tx session focused on self care.  OTR provided mod assist for supine <> sit transfers, providing assistance to lift trunk and manage BLE.  Pt able to assist with all movements, but is limited by generalized weakness.  OTR provided supervision/intermittent CGA for pt's sitting balance while seated EOB.  Pt able to brush teeth, wash face, and comb hair while seated EOB with setup assist from OTR.  Pt tolerated sitting EOB for ~15 minutes and reports feeling more comfortable sitting up vs. Lying in bed.  Kristina Wright will continue to benefit from skilled OT services in acute setting to address functional strengthening, endurance, and safety and independence in ADLs.  Discharge recommendation remains appropriate.   Follow Up Recommendations  SNF;Supervision/Assistance - 24 hour    Equipment Recommendations  None recommended by OT (Pt has necessary equipment)    Recommendations for Other Services      Precautions / Restrictions Precautions Precautions: Fall Restrictions Weight Bearing Restrictions: No       Mobility Bed Mobility Overal bed mobility: Needs Assistance Bed Mobility: Supine to Sit;Sit to  Supine Rolling: Mod assist   Supine to sit: Mod assist Sit to supine: Mod assist   General bed mobility comments: assistance for trunk to sit upright and support for BLE to return to bed. verbal cues for safety and technique   Transfers Overall transfer level: Needs assistance                    Balance Overall balance assessment: Needs assistance Sitting-balance support: Feet supported;Single extremity supported Sitting balance-Leahy Scale: Fair Sitting balance - Comments: patient using intermittent unilateral UE support due to muscle fatigue. stand by assistance for safety with sitting upright at edge of bed                                    ADL either performed or assessed with clinical judgement   ADL Overall ADL's : Needs assistance/impaired Eating/Feeding: Set up;Sitting   Grooming: Set up;Sitting Grooming Details (indicate cue type and reason): OTR provided setup assist for pt to wash face, brush teeth, and comb hair while seated EOB                               General ADL Comments: OTR provided setup assist for pt to perform grooming tasks while seated EOB.  OTR provided mod A for supine <> sit transfers, and stand by/CGA for sitting balance while seated EOB.  Pt continues to require large amount of assist to attempt sit to stand transfers, did not attempt this session 2/2 safety.  Pt unable to reach feet 2/2 abdominal pain  and decreased lower body access, requires likely max-total A for lower body dressing/bathing.     Vision Baseline Vision/History: Wears glasses Wears Glasses: At all times Patient Visual Report: No change from baseline     Perception     Praxis      Cognition Arousal/Alertness: Awake/alert Behavior During Therapy: WFL for tasks assessed/performed Overall Cognitive Status: Within Functional Limits for tasks assessed                                 General Comments: Pt pleasant and engaged  throughout session        Exercises Other Exercises Other Exercises: OTR provided education re: bed mobility, self care.  Provided mod assist for supine <> sit, stand by assist for sitting balance while EOB and setup assist for seated grooming tasks   Shoulder Instructions       General Comments      Pertinent Vitals/ Pain       Pain Assessment: Faces Faces Pain Scale: Hurts little more Pain Location: abdomen around incisional site Pain Descriptors / Indicators: Discomfort;Constant;Grimacing;Guarding Pain Intervention(s): Limited activity within patient's tolerance;Monitored during session;Utilized relaxation techniques  Home Living                                          Prior Functioning/Environment              Frequency  Min 2X/week        Progress Toward Goals  OT Goals(current goals can now be found in the care plan section)  Progress towards OT goals: Progressing toward goals  Acute Rehab OT Goals Patient Stated Goal: to go home  OT Goal Formulation: With patient Time For Goal Achievement: 11/23/19 Potential to Achieve Goals: Good  Plan Discharge plan remains appropriate;Frequency remains appropriate    Co-evaluation                 AM-PAC OT "6 Clicks" Daily Activity     Outcome Measure   Help from another person eating meals?: A Little Help from another person taking care of personal grooming?: A Little Help from another person toileting, which includes using toliet, bedpan, or urinal?: A Lot Help from another person bathing (including washing, rinsing, drying)?: A Lot Help from another person to put on and taking off regular upper body clothing?: A Little Help from another person to put on and taking off regular lower body clothing?: A Lot 6 Click Score: 15    End of Session    OT Visit Diagnosis: Other abnormalities of gait and mobility (R26.89);Muscle weakness (generalized) (M62.81);Pain Pain - Right/Left:   (abdomen, B feet) Pain - part of body: Ankle and joints of foot;Leg (abdomen)   Activity Tolerance Patient tolerated treatment well   Patient Left in bed;with call bell/phone within reach;with bed alarm set   Nurse Communication          Time: 1950-9326 OT Time Calculation (min): 43 min  Charges: OT General Charges $OT Visit: 1 Visit OT Treatments $Self Care/Home Management : 38-52 mins  Myrtie Hawk Shaquina Gillham, OTR/L 11/17/19, 1:01 PM

## 2019-11-17 NOTE — Progress Notes (Signed)
Patient clinically stable post Drain/abscess placement. Denies complaints at this time. Awake/alert post procedure. 8fr Drain placed per Dr Pascal Lux with 37ml purulent drainage , placed to gravity bag. Received Versed 2mg  along with Fentanyl 137mcg IV for procedure. Report given to TIA RN post procedure in room after recovery with questions answered.

## 2019-11-17 NOTE — Procedures (Signed)
Pre procedural Dx: Post op abscess Post procedural Dx: Same  Technically successful CT guided placed of a 10 Fr drainage catheter placement into the lower pelvis via right transgluteal approach yielding 60 cc of purulent fluid.    A representative aspirated sample was capped and sent to the laboratory for analysis.    EBL: Trace Complications: None immediate  Ronny Bacon, MD Pager #: [phone]

## 2019-11-17 NOTE — Progress Notes (Signed)
PT Cancellation Note  Patient Details Name: Kristina Wright MRN: [id number] DOB: 10-18-1955   Cancelled Treatment:    Reason Eval/Treat Not Completed: Patient not medically ready; Per nursing pt recently back from procedure with lingering effects of anesthesia and not appropriate for PT services at this time.  Will attempt to see pt at a future date/time as medically appropriate.    Linus Salmons PT, DPT 11/17/19, 3:44 PM

## 2019-11-17 NOTE — Progress Notes (Addendum)
PROGRESS NOTE    Kristina Wright  IPJ:[id number] DOB: 1956-02-22 DOA: 10/30/2019 PCP: Juline Patch, MD   Brief Narrative: Kristina Wright is a 64 y.o. female with a history of hypothyroidism, stage IV lung cancer with brain metastasis, hypertension, hyperlipidemia, depression, cervical cancer.  Patient presents secondary to diarrhea and abdominal pain.  CT abdomen and pelvis was revealing for a perforated bowel secondary to perforated diverticulitis.  Patient was emergently managed in the OR and underwent exploratory laparotomy with lysis of adhesions, abdominal washout, sigmoid colon resection with colostomy.  Hospitalization was complicated by metabolic encephalopathy secondary to vasogenic edema in setting of metastatic lesions which responded to steroid therapy.  Hospitalization was also complicated by abdominal wound infection managed on IV antibiotics and wound care per general surgery.  Initial plan for discharge home with hospice which has now changed. Patient has decided on full scope treatment and plan is for discharge to SNF with outpatient hematology/oncology follow-up.   Assessment & Plan:   Active Problems:   Malignant neoplasm of lung (HCC)   Endotracheal tube present   Bowel perforation (HCC)   Pressure injury of skin   Brain edema (HCC)   Normocytic anemia   Palliative care by specialist   Encounter for orogastric tube placement   DNR (do not resuscitate)   Immunosuppression due to chronic steroid use   Ileus ([place])   Perforated diverticulitis Patient underwent emergent exploratory laparotomy with lysis of adhesions and sigmoid colon resection with colostomy complicated by midline wound infection.  Postop, patient was managed in ICU while intubated.  She was extubated on 10/31/2019.  Patient was managed on NG tube which was discontinued on 11/02/2019.  Diet has been advanced to regular.  She was started on IV Zosyn for her wound infection.  Repeat CT scan significant for  fluid collection which should be evacuated by current drain in place per general surgery recommendations. -Wound care per general surgery/WOC Wbcs trending up.stat CT -persistent fluid collection in the posterior pelvis similar or minimally increased since the prior CT.  The drainage catheter does not contact the fluid collection in the posterior pelvis.  Ovarian teratoma on the left noted needs outpatient follow-up. Consulted IR   Abdominal wound infection Concern for possible Pseudomonas infection.  No cultures obtained.  Patient has been managed on IV Zosyn with recommendations per general surgery to transition to oral antibiotics. No organism identified so patient transitioned to Ciprofloxacin. Patient has been treated for 2 weeks. Discontinued antibiotics.  Acute metabolic encephalopathy Secondary to vasogenic edema from metastatic lesions. Improved with steroid treatment. She was initially managed in ICU and transitioned to comfort measures before mental status drastically improved. Currently at baseline.  Vasogenic edema Secondary to metastatic lesions -Continue Decadron 4 mg TID; continue on discharge  Stage IV lung cancer with metastasis to brain Patient follows with oncology. Started on [place] for seizure prophylaxis. Previously DNR and now full code. Patient is wanting full scope care and treatment options for cancer. Medical oncology consulted and has recommended restarting osimertinib. -Continue osimertinib  Acute blood loss anemia In setting of above. Patient required 1 unit of PRBC. Hemoglobin is stable.  Mouth twitching Unknown etiology. Possibly facial spasms. Resolved spontaneously. Evaluated by neurology who did not think episode was related to seizure activity.  Hypothyroidism -Continue Synthroid  Pressure injury Mid-sacrum, unsure if present on admission   DVT prophylaxis: Lovenox Code Status:   Code Status: Full Code Family Communication: None at  bedside Disposition Plan: Discharge to SNF  when cleared by general surgery Consultants:   General surgery  Neurology  PCCM  Hematology/Oncology  Palliative care medicine  Procedures:   EXPLORATORY LAPAROTOMY; LYSIS OF ADHESIONS; ABDOMINAL WASHOUT; SIDMOID COLON RESECTION WITH COLOSTOMY (10/30/2019)  Antimicrobials:  Zosyn (7/11>>7/21)   Ciprofloxacin (7/22>>7/25)   Subjective: Feels tired and weak anxious to eat  Objective: Vitals:   11/16/19 0611 11/16/19 1156 11/16/19 1926 11/17/19 0410  BP: (!) 155/64 (!) 145/67 126/66 (!) 100/63  Pulse: 75 92 91 83  Resp: 16 14 20 16   Temp: 97.8 F (36.6 C) 97.7 F (36.5 C) 97.8 F (36.6 C) (!) 97.3 F (36.3 C)  TempSrc: Oral  Oral Oral  SpO2: 98% 97% 97% 96%  Weight:      Height:        Intake/Output Summary (Last 24 hours) at 11/17/2019 1219 Last data filed at 11/17/2019 0818 Gross per 24 hour  Intake 240 ml  Output 3100 ml  Net -2860 ml   Filed Weights   10/30/19 1452 10/30/19 2248 11/06/19 1538  Weight: 103 kg 100.6 kg 101.8 kg    Examination:  General exam: Appears calm and comfortable Respiratory system: Clear to auscultation. Respiratory effort normal. Cardiovascular system: S1 & S2 heard, RRR. No murmurs, rubs, gallops or clicks. Gastrointestinal system: Abdomen is distended, soft and nontender. Ostomy with air and brown stool. Abdominal wound dressing is CDI. Normal bowel sounds heard.drain in place Central nervous system: Alert and oriented. No focal neurological deficits. Musculoskeletal: BLE foot edema. No calf tenderness Skin: No cyanosis. No rashes Psychiatry: Judgement and insight appear normal. Mood & affect appropriate.     Data Reviewed: I have personally reviewed following labs and imaging studies  CBC Lab Results  Component Value Date   WBC 16.7 (H) 11/17/2019   RBC 3.18 (L) 11/17/2019   HGB 9.8 (L) 11/17/2019   HCT 31.0 (L) 11/17/2019   MCV 97.5 11/17/2019   MCH 30.8 11/17/2019    PLT 242 11/17/2019   MCHC 31.6 11/17/2019   RDW 16.3 (H) 11/17/2019   LYMPHSABS 1.2 11/08/2019   MONOABS 0.4 11/08/2019   EOSABS 0.0 11/08/2019   BASOSABS 0.0 34/19/3790     Last metabolic panel Lab Results  Component Value Date   NA 137 11/16/2019   K 4.3 11/16/2019   CL 101 11/16/2019   CO2 25 11/16/2019   BUN 26 (H) 11/16/2019   CREATININE 0.60 11/16/2019   GLUCOSE 126 (H) 11/16/2019   GFRNONAA >60 11/16/2019   GFRAA >60 11/16/2019   CALCIUM 8.6 (L) 11/16/2019   PHOS 3.6 11/16/2019   PROT 6.7 10/30/2019   ALBUMIN 2.2 (L) 10/30/2019   LABGLOB 2.8 08/02/2019   AGRATIO 1.5 08/02/2019   BILITOT 0.9 10/30/2019   ALKPHOS 100 10/30/2019   AST 26 10/30/2019   ALT 43 10/30/2019   ANIONGAP 11 11/16/2019    CBG (last 3)  Recent Labs    11/16/19 2111 11/17/19 0804 11/17/19 1116  GLUCAP 217* 112* 110*     GFR: Estimated Creatinine Clearance: 93.7 mL/min (by C-G formula based on SCr of 0.6 mg/dL).  Coagulation Profile: No results for input(s): INR, PROTIME in the last 168 hours.  No results found for this or any previous visit (from the past 240 hour(s)).      Radiology Studies: CT ABDOMEN PELVIS W CONTRAST  Result Date: 11/16/2019 CLINICAL DATA:  64 year old female with concern for abdominal infection. EXAM: CT ABDOMEN AND PELVIS WITH CONTRAST TECHNIQUE: Multidetector CT imaging of the abdomen and pelvis was  performed using the standard protocol following bolus administration of intravenous contrast. CONTRAST:  111mL OMNIPAQUE IOHEXOL 300 MG/ML  SOLN COMPARISON:  CT abdomen pelvis dated 11/08/2019. FINDINGS: Lower chest: Left lung base linear and streaky atelectasis. There is coronary vascular calcification. Partially visualized tip of a central venous line at the cavoatrial junction. No intra-abdominal free air. Small free fluid in the pelvis. Hepatobiliary: There is mild irregularity of the liver contour concerning for early changes of cirrhosis. Clinical correlation  is recommended. No intrahepatic biliary ductal dilatation. The gallbladder is unremarkable. Pancreas: Unremarkable. No pancreatic ductal dilatation or surrounding inflammatory changes. Spleen: Normal in size without focal abnormality. Adrenals/Urinary Tract: The adrenal glands unremarkable. There is no hydronephrosis on either side. There is symmetric enhancement and excretion of contrast by both kidneys. The visualized ureters and urinary bladder appear unremarkable. Stomach/Bowel: There is postsurgical changes of sigmoid colon resection with a left lower quadrant colostomy. There is no bowel obstruction or active inflammation. There is a 2 cm duodenal diverticulum. The appendix is normal. Vascular/Lymphatic: Moderate aortoiliac atherosclerotic disease. The IVC is unremarkable. No portal venous gas. There is no adenopathy. Reproductive: The uterus is grossly unremarkable. The right ovary is not visualized with certainty. There is a 2.2 x 1.9 cm hypodense lesion with a focus of fat and calcium in the left ovary most consistent with a teratoma. Further evaluation with ultrasound on a nonemergent/outpatient basis recommended. Other: There is a 4 x 7 cm probably partially loculated fluid within the pelvis, similar or minimally increased since the prior CT. There is a focal area of fat stranding in the left hemipelvis superiorly measuring 3.3 x 2.5 cm (59/2) adjacent to the surgical suture of the blind end of the Hartmann's pouch which appears similar to prior CT, likely postsurgical. A drainage catheter again noted with tip anterior to the uterine fundus. The catheter does not contact the fluid collection in the posterior pelvis. Consider re-evaluation and repositioning. Musculoskeletal: Midline vertical anterior pelvic wall large open wound with packing material. No acute osseous pathology. IMPRESSION: 1. Postsurgical changes of sigmoid colon resection with a left lower quadrant colostomy. No bowel obstruction. Normal  appendix. 2. Persistent fluid collection in the posterior pelvis similar or minimally increased since the prior CT. The drainage catheter does not contact the fluid collection in the posterior pelvis. Consider re-evaluation and repositioning. 3. A 2.2 x 1.9 cm left ovarian teratoma. Further evaluation with ultrasound on a nonemergent/outpatient basis recommended. 4. Aortic Atherosclerosis (ICD10-I70.0). Electronically Signed   By: Anner Crete M.D.   On: 11/16/2019 16:21        Scheduled Meds: . sodium chloride   Intravenous Once  . acyclovir  400 mg Oral BID  . atorvastatin  10 mg Oral Daily  . busPIRone  5 mg Oral Daily  . Chlorhexidine Gluconate Cloth  6 each Topical Daily  . collagenase   Topical Daily  . dexamethasone  4 mg Oral Q8H  . famotidine  20 mg Oral Daily  . feeding supplement (ENSURE ENLIVE)  237 mL Oral TID BM  . FLUoxetine  10 mg Oral Daily  . lisinopril  20 mg Oral Daily   And  . hydrochlorothiazide  25 mg Oral Daily  . insulin aspart  0-9 Units Subcutaneous TID WC  . levETIRAcetam  500 mg Oral BID  . levothyroxine  112 mcg Oral Daily  . multivitamin with minerals  1 tablet Oral Daily  . nystatin cream   Topical BID  . osimertinib mesylate  80 mg  Oral Daily  . sodium chloride flush  10-40 mL Intracatheter Q12H  . umeclidinium-vilanterol  1 puff Inhalation Daily   Continuous Infusions: . sodium chloride Stopped (11/07/19 1509)     LOS: 18 days     Landis Gandy MD 11/17/2019, 12:19 PM  If 7PM-7AM, please contact night-coverage [email or web address]

## 2019-11-18 DIAGNOSIS — K631 Perforation of intestine (nontraumatic): Secondary | ICD-10-CM | POA: Diagnosis not present

## 2019-11-18 DIAGNOSIS — Z978 Presence of other specified devices: Secondary | ICD-10-CM | POA: Diagnosis not present

## 2019-11-18 DIAGNOSIS — Z4659 Encounter for fitting and adjustment of other gastrointestinal appliance and device: Secondary | ICD-10-CM | POA: Diagnosis not present

## 2019-11-18 LAB — CBC WITH DIFFERENTIAL/PLATELET
Abs Immature Granulocytes: 0.38 10*3/uL — ABNORMAL HIGH (ref 0.00–0.07)
Basophils Absolute: 0 10*3/uL (ref 0.0–0.1)
Basophils Relative: 0 %
Eosinophils Absolute: 0 10*3/uL (ref 0.0–0.5)
Eosinophils Relative: 0 %
HCT: 32.6 % — ABNORMAL LOW (ref 36.0–46.0)
Hemoglobin: 10.4 g/dL — ABNORMAL LOW (ref 12.0–15.0)
Immature Granulocytes: 2 %
Lymphocytes Relative: 6 %
Lymphs Abs: 1 10*3/uL (ref 0.7–4.0)
MCH: 31 pg (ref 26.0–34.0)
MCHC: 31.9 g/dL (ref 30.0–36.0)
MCV: 97 fL (ref 80.0–100.0)
Monocytes Absolute: 0.5 10*3/uL (ref 0.1–1.0)
Monocytes Relative: 3 %
Neutro Abs: 16.4 10*3/uL — ABNORMAL HIGH (ref 1.7–7.7)
Neutrophils Relative %: 89 %
Platelets: 234 10*3/uL (ref 150–400)
RBC: 3.36 MIL/uL — ABNORMAL LOW (ref 3.87–5.11)
RDW: 16.7 % — ABNORMAL HIGH (ref 11.5–15.5)
WBC: 18.3 10*3/uL — ABNORMAL HIGH (ref 4.0–10.5)
nRBC: 0.4 % — ABNORMAL HIGH (ref 0.0–0.2)

## 2019-11-18 LAB — GLUCOSE, CAPILLARY
Glucose-Capillary: 119 mg/dL — ABNORMAL HIGH (ref 70–99)
Glucose-Capillary: 180 mg/dL — ABNORMAL HIGH (ref 70–99)
Glucose-Capillary: 137 mg/dL — ABNORMAL HIGH (ref 70–99)
Glucose-Capillary: 153 mg/dL — ABNORMAL HIGH (ref 70–99)

## 2019-11-18 LAB — CREATININE, SERUM
Creatinine, Ser: 0.64 mg/dL (ref 0.44–1.00)
GFR calc Af Amer: >60 mL/min (ref >60)
GFR calc non Af Amer: >60 mL/min (ref >60)

## 2019-11-18 MED ORDER — CLOPIDOGREL BISULFATE 75 MG PO TABS
75.0000 mg | ORAL_TABLET | Freq: Every day | ORAL | Status: DC
  Administered 2019-11-18 – 2019-11-21 (×4): 75 mg via ORAL
  Filled 2019-11-18 (×4): qty 1

## 2019-11-18 NOTE — Consult Note (Signed)
Brief pharmacy note:  Resuming LMWH and clopidogrel 1 day following CT guided aspiration of abscess per post procedure management guidelines.  Lu Duffel, PharmD, BCPS Clinical Pharmacist 11/18/2019 10:40 AM

## 2019-11-18 NOTE — Progress Notes (Signed)
PROGRESS NOTE    Kristina Wright  DZH:[id number] DOB: 1955-12-29 DOA: 10/30/2019 PCP: Juline Patch, MD   Brief Narrative: Kristina Wright is a 64 y.o. female with a history of hypothyroidism, stage IV lung cancer with brain metastasis, hypertension, hyperlipidemia, depression, cervical cancer.  Patient presents secondary to diarrhea and abdominal pain.  CT abdomen and pelvis was revealing for a perforated bowel secondary to perforated diverticulitis.  Patient was emergently managed in the OR and underwent exploratory laparotomy with lysis of adhesions, abdominal washout, sigmoid colon resection with colostomy.  Hospitalization was complicated by metabolic encephalopathy secondary to vasogenic edema in setting of metastatic lesions which responded to steroid therapy.  Hospitalization was also complicated by abdominal wound infection managed on IV antibiotics and wound care per general surgery.  Initial plan for discharge home with hospice which has now changed. Patient has decided on full scope treatment and plan is for discharge to SNF with outpatient hematology/oncology follow-up.   Assessment & Plan:   Active Problems:   Malignant neoplasm of lung (HCC)   Endotracheal tube present   Bowel perforation (HCC)   Pressure injury of skin   Brain edema (HCC)   Normocytic anemia   Palliative care by specialist   Encounter for orogastric tube placement   DNR (do not resuscitate)   Immunosuppression due to chronic steroid use   Ileus ([place])   Perforated diverticulitis Patient underwent emergent exploratory laparotomy with lysis of adhesions and sigmoid colon resection with colostomy complicated by midline wound infection.  Postop, patient was managed in ICU while intubated.  She was extubated on 10/31/2019.  Patient was managed on NG tube which was discontinued on 11/02/2019.  Diet has been advanced to regular.  She was started on IV Zosyn for her wound infection.  Repeat CT scan significant for  fluid collection which should be evacuated by current drain in place per general surgery recommendations. -Wound care per general surgery/WOC Wbcs still trending up.stat CT -persistent fluid collection in the posterior pelvis similar or minimally increased since the prior CT.  The drainage catheter does not contact the fluid collection in the posterior pelvis.  Ovarian teratoma on the left noted needs outpatient follow-up. Consulted IR status post drain placement with multiple growth in process. Will need to see WBC is trending down prior to discharge.  Abdominal wound infection Concern for possible Pseudomonas infection.  No cultures obtained.  Patient has been managed on IV Zosyn with recommendations per general surgery to transition to oral antibiotics. No organism identified so patient transitioned to Ciprofloxacin. Patient has been treated for 2 weeks. Discontinued antibiotics.  Acute metabolic encephalopathy Secondary to vasogenic edema from metastatic lesions. Improved with steroid treatment. She was initially managed in ICU and transitioned to comfort measures before mental status drastically improved. Currently at baseline.  Vasogenic edema Secondary to metastatic lesions -Continue Decadron 4 mg TID; continue on discharge  Stage IV lung cancer with metastasis to brain Patient follows with oncology. Started on Vandiver for seizure prophylaxis. Previously DNR and now full code. Patient is wanting full scope care and treatment options for cancer. Medical oncology consulted and has recommended restarting osimertinib. -Continue osimertinib  Acute blood loss anemia In setting of above. Patient required 1 unit of PRBC. Hemoglobin is stable.  Mouth twitching Unknown etiology. Possibly facial spasms. Resolved spontaneously. Evaluated by neurology who did not think episode was related to seizure activity.  Hypothyroidism -Continue Synthroid  Pressure injury Mid-sacrum, unsure if present on  admission   DVT  prophylaxis: Lovenox Code Status:   Code Status: Full Code Family Communication: None at bedside Disposition Plan: Discharge to SNF when cleared by general surgery Consultants:   General surgery  Neurology  PCCM  Hematology/Oncology  Palliative care medicine  Procedures:   EXPLORATORY LAPAROTOMY; LYSIS OF ADHESIONS; ABDOMINAL WASHOUT; SIDMOID COLON RESECTION WITH COLOSTOMY (10/30/2019)  Antimicrobials:  Zosyn (7/11>>7/21)   Ciprofloxacin (7/22>>7/25)   Subjective: Feels better wants to go to rehab denies any new pains or increased pain  Objective: Vitals:   11/17/19 1435 11/17/19 2110 11/18/19 0414 11/18/19 1202  BP: 123/69 116/73 (!) 104/61 96/83  Pulse: 91 100 89 91  Resp: 13 20 18 20   Temp:  98.2 F (36.8 C) 97.7 F (36.5 C) 98.1 F (36.7 C)  TempSrc:  Oral Oral   SpO2: 100% 97% 95% 96%  Weight:      Height:        Intake/Output Summary (Last 24 hours) at 11/18/2019 1457 Last data filed at 11/18/2019 1300 Gross per 24 hour  Intake 495 ml  Output 319 ml  Net 176 ml   Filed Weights   10/30/19 1452 10/30/19 2248 11/06/19 1538  Weight: 103 kg 100.6 kg 101.8 kg    Examination:  General exam: Appears calm and comfortable Respiratory system: Clear to auscultation. Respiratory effort normal. Cardiovascular system: S1 & S2 heard, RRR. No murmurs, rubs, gallops or clicks. Gastrointestinal system: Abdomen is distended, soft and nontender. Ostomy with air and liquid brown stool. Abdominal wound dressing is CDI. Normal bowel sounds heard.2 drain in place Central nervous system: Alert and oriented. No focal neurological deficits. Musculoskeletal: BLE foot edema. No calf tenderness Skin: No cyanosis. No rashes Psychiatry: Judgement and insight appear normal. Mood & affect appropriate.     Data Reviewed: I have personally reviewed following labs and imaging studies  CBC Lab Results  Component Value Date   WBC 18.3 (H) 11/18/2019   RBC  3.36 (L) 11/18/2019   HGB 10.4 (L) 11/18/2019   HCT 32.6 (L) 11/18/2019   MCV 97.0 11/18/2019   MCH 31.0 11/18/2019   PLT 234 11/18/2019   MCHC 31.9 11/18/2019   RDW 16.7 (H) 11/18/2019   LYMPHSABS 1.0 11/18/2019   MONOABS 0.5 11/18/2019   EOSABS 0.0 11/18/2019   BASOSABS 0.0 67/67/2094     Last metabolic panel Lab Results  Component Value Date   NA 137 11/16/2019   K 4.3 11/16/2019   CL 101 11/16/2019   CO2 25 11/16/2019   BUN 26 (H) 11/16/2019   CREATININE 0.64 11/18/2019   GLUCOSE 126 (H) 11/16/2019   GFRNONAA >60 11/18/2019   GFRAA >60 11/18/2019   CALCIUM 8.6 (L) 11/16/2019   PHOS 3.6 11/16/2019   PROT 6.7 10/30/2019   ALBUMIN 2.2 (L) 10/30/2019   LABGLOB 2.8 08/02/2019   AGRATIO 1.5 08/02/2019   BILITOT 0.9 10/30/2019   ALKPHOS 100 10/30/2019   AST 26 10/30/2019   ALT 43 10/30/2019   ANIONGAP 11 11/16/2019    CBG (last 3)  Recent Labs    11/17/19 2106 11/18/19 0740 11/18/19 1200  GLUCAP 156* 119* 180*     GFR: Estimated Creatinine Clearance: 93.7 mL/min (by C-G formula based on SCr of 0.64 mg/dL).  Coagulation Profile: No results for input(s): INR, PROTIME in the last 168 hours.  Recent Results (from the past 240 hour(s))  Aerobic/Anaerobic Culture (surgical/deep wound)     Status: None (Preliminary result)   Collection Time: 11/17/19  2:40 PM   Specimen: Abscess  Result  Value Ref Range Status   Specimen Description   Final    ABSCESS Performed at [facility], [address] [place], [place] [address]    Special Requests   Final    RIGHT TG DRAIN Performed at [facility], [address] [place], [place] [address]    Gram Stain   Final    ABUNDANT WBC PRESENT, PREDOMINANTLY PMN ABUNDANT GRAM POSITIVE COCCI ABUNDANT GRAM NEGATIVE RODS FEW GRAM POSITIVE RODS    Culture   Final    CULTURE REINCUBATED FOR BETTER GROWTH Performed at [facility], [place] [address] [place], [place] [address]    Report Status  PENDING  Incomplete        Radiology Studies: CT ABDOMEN PELVIS W CONTRAST  Result Date: 11/16/2019 CLINICAL DATA:  64 year old female with concern for abdominal infection. EXAM: CT ABDOMEN AND PELVIS WITH CONTRAST TECHNIQUE: Multidetector CT imaging of the abdomen and pelvis was performed using the standard protocol following bolus administration of intravenous contrast. CONTRAST:  186mL OMNIPAQUE IOHEXOL 300 MG/ML  SOLN COMPARISON:  CT abdomen pelvis dated 11/08/2019. FINDINGS: Lower chest: Left lung base linear and streaky atelectasis. There is coronary vascular calcification. Partially visualized tip of a central venous line at the cavoatrial junction. No intra-abdominal free air. Small free fluid in the pelvis. Hepatobiliary: There is mild irregularity of the liver contour concerning for early changes of cirrhosis. Clinical correlation is recommended. No intrahepatic biliary ductal dilatation. The gallbladder is unremarkable. Pancreas: Unremarkable. No pancreatic ductal dilatation or surrounding inflammatory changes. Spleen: Normal in size without focal abnormality. Adrenals/Urinary Tract: The adrenal glands unremarkable. There is no hydronephrosis on either side. There is symmetric enhancement and excretion of contrast by both kidneys. The visualized ureters and urinary bladder appear unremarkable. Stomach/Bowel: There is postsurgical changes of sigmoid colon resection with a left lower quadrant colostomy. There is no bowel obstruction or active inflammation. There is a 2 cm duodenal diverticulum. The appendix is normal. Vascular/Lymphatic: Moderate aortoiliac atherosclerotic disease. The IVC is unremarkable. No portal venous gas. There is no adenopathy. Reproductive: The uterus is grossly unremarkable. The right ovary is not visualized with certainty. There is a 2.2 x 1.9 cm hypodense lesion with a focus of fat and calcium in the left ovary most consistent with a teratoma. Further evaluation with  ultrasound on a nonemergent/outpatient basis recommended. Other: There is a 4 x 7 cm probably partially loculated fluid within the pelvis, similar or minimally increased since the prior CT. There is a focal area of fat stranding in the left hemipelvis superiorly measuring 3.3 x 2.5 cm (59/2) adjacent to the surgical suture of the blind end of the Hartmann's pouch which appears similar to prior CT, likely postsurgical. A drainage catheter again noted with tip anterior to the uterine fundus. The catheter does not contact the fluid collection in the posterior pelvis. Consider re-evaluation and repositioning. Musculoskeletal: Midline vertical anterior pelvic wall large open wound with packing material. No acute osseous pathology. IMPRESSION: 1. Postsurgical changes of sigmoid colon resection with a left lower quadrant colostomy. No bowel obstruction. Normal appendix. 2. Persistent fluid collection in the posterior pelvis similar or minimally increased since the prior CT. The drainage catheter does not contact the fluid collection in the posterior pelvis. Consider re-evaluation and repositioning. 3. A 2.2 x 1.9 cm left ovarian teratoma. Further evaluation with ultrasound on a nonemergent/outpatient basis recommended. 4. Aortic Atherosclerosis (ICD10-I70.0). Electronically Signed   By: Anner Crete M.D.   On: 11/16/2019 16:21  CT IMAGE GUIDED DRAINAGE BY PERCUTANEOUS CATHETER  Result Date: 11/17/2019 INDICATION: History of perforated diverticulitis, post Hartmann's procedure, currently postoperative day 17 (Dr. Lysle Pearl). CT scan of the abdomen pelvis performed 11/16/2018 for evaluation of leukocytosis demonstrated an indeterminate fluid collection with the pelvic cul-de-sac. As such, request made for a CT-guided pelvic fluid collection aspiration and/or drainage catheter placement for infection source control purposes. EXAM: CT IMAGE GUIDED DRAINAGE BY PERCUTANEOUS CATHETER COMPARISON:  CT abdomen  pelvis-11/16/2019; 11/08/2019 MEDICATIONS: The patient is currently admitted to the hospital and receiving intravenous antibiotics. The antibiotics were administered within an appropriate time frame prior to the initiation of the procedure. ANESTHESIA/SEDATION: Moderate (conscious) sedation was employed during this procedure. A total of Versed 2 mg and Fentanyl 100 mcg was administered intravenously. Moderate Sedation Time: 10 minutes. The patient's level of consciousness and vital signs were monitored continuously by radiology nursing throughout the procedure under my direct supervision. CONTRAST:  None COMPLICATIONS: None immediate. PROCEDURE: Informed written consent was obtained from the patient after a discussion of the risks, benefits and alternatives to treatment. The patient was placed left lateral decubitus on the CT gantry and a pre procedural CT was performed re-demonstrating the known abscess/fluid collection within the pelvic cul-de-sac with dominant collection measuring approximately 6.7 x 4.8 cm (image 31, series 5). The procedure was planned. A timeout was performed prior to the initiation of the procedure. The skin overlying the right buttocks was prepped and draped in the usual sterile fashion. The overlying soft tissues were anesthetized with 1% lidocaine with epinephrine. Utilizing a right trans gluteal approach, appropriate trajectory was planned with the use of a 22 gauge spinal needle. An 18 gauge trocar needle was advanced into the abscess/fluid collection and a short Amplatz super stiff wire was coiled within the collection. Appropriate positioning was confirmed with a limited CT scan. The tract was serially dilated allowing placement of a 10 [place] all-purpose drainage catheter. Appropriate positioning was confirmed with a limited postprocedural CT scan. Approximately 60 ml of purulent fluid was aspirated. The tube was connected to a drainage bag and sutured in place. A dressing was placed.  The patient tolerated the procedure well without immediate post procedural complication. IMPRESSION: Successful CT guided placement of a 10 French all purpose drain catheter into the pelvic abscess via right trans gluteal approach with aspiration of 60 mL of purulent fluid. Samples were sent to the laboratory as requested by the ordering clinical team. Electronically Signed   By: Sandi Mariscal M.D.   On: 11/17/2019 15:19        Scheduled Meds: . sodium chloride   Intravenous Once  . acyclovir  400 mg Oral BID  . atorvastatin  10 mg Oral Daily  . busPIRone  5 mg Oral Daily  . Chlorhexidine Gluconate Cloth  6 each Topical Daily  . clopidogrel  75 mg Oral Daily  . collagenase   Topical Daily  . dexamethasone  4 mg Oral Q8H  . enoxaparin (LOVENOX) injection  40 mg Subcutaneous Q24H  . famotidine  20 mg Oral Daily  . feeding supplement (ENSURE ENLIVE)  237 mL Oral TID BM  . FLUoxetine  10 mg Oral Daily  . lisinopril  20 mg Oral Daily   And  . hydrochlorothiazide  25 mg Oral Daily  . insulin aspart  0-9 Units Subcutaneous TID WC  . levETIRAcetam  500 mg Oral BID  . levothyroxine  112 mcg Oral Daily  . multivitamin with minerals  1 tablet Oral Daily  .  nystatin cream   Topical BID  . osimertinib mesylate  80 mg Oral Daily  . sodium chloride flush  10-40 mL Intracatheter Q12H  . sodium chloride flush  5 mL Intracatheter Q8H  . umeclidinium-vilanterol  1 puff Inhalation Daily   Continuous Infusions: . sodium chloride Stopped (11/07/19 1509)     LOS: 19 days     Landis Gandy MD 11/18/2019, 2:57 PM  If 7PM-7AM, please contact night-coverage [email or web address]

## 2019-11-18 NOTE — Progress Notes (Signed)
PT Cancellation Note  Patient Details Name: Kristina Wright MRN: [id number] DOB: November 12, 1955   Cancelled Treatment:     Pt continues to refuse therapys this date. Will continue efforts as able per POC.  Willette Pa 11/18/2019, 2:08 PM

## 2019-11-18 NOTE — Consult Note (Addendum)
[place] Nurse ostomy follow up Surgical team following for assessment and plan of care for abd wound. Pt states bedside nurse changed colostomy pouch yesterday.  Intact with good seal and mod amt liquid brown stool.  5 sets of wafers/pouches/barrier rings and educational materials in the room.  Pt plans to transfer to facility where she will have total assistance with pouch application and emptying. Denies further questions at this time.  Julien Girt MSN, RN, [place], [place], [place]

## 2019-11-18 NOTE — Progress Notes (Signed)
Subjective:  CC: Kristina Wright is a 64 y.o. female  Hospital stay day 19, 19 Days Post-Op Hartman's procedure for perforated diverticulitis  HPI: No acute issues overnight. S/p IR drain of persistent pelvic fluid collection.  ROS:  General: Denies weight loss, weight gain, fatigue, fevers, chills, and night sweats. Heart: Denies chest pain, palpitations, racing heart, irregular heartbeat, leg pain or swelling, and decreased activity tolerance. Respiratory: Denies breathing difficulty, shortness of breath, wheezing, cough, and sputum. GI: Denies change in appetite, heartburn, nausea, vomiting, constipation, diarrhea, and blood in stool. GU: Denies difficulty urinating, pain with urinating, urgency, frequency, blood in urine.   Objective:   Temp:  [97.7 F (36.5 C)-98.2 F (36.8 C)] 97.7 F (36.5 C) (07/30 0414) Pulse Rate:  [88-100] 89 (07/30 0414) Resp:  [12-20] 18 (07/30 0414) BP: (104-136)/(61-73) 104/61 (07/30 0414) SpO2:  [95 %-100 %] 95 % (07/30 0414) FiO2 (%):  [2 %] 2 % (07/29 1420)     Height: 5\' 9"  (175.3 cm) Weight: 101.8 kg BMI (Calculated): 33.13   Intake/Output this shift:   Intake/Output Summary (Last 24 hours) at 11/18/2019 0925 Last data filed at 11/18/2019 0530 Gross per 24 hour  Intake 15 ml  Output 319 ml  Net -304 ml    Constitutional :  alert, cooperative, appears stated age and no distress  Respiratory:  clear to auscultation bilaterally  Cardiovascular:  regular rate and rhythm  Gastrointestinal: .  Soft, no guarding, midline incision packing recently changed. No evidence of expanding erythema or cellulitis from area.  Fascia feels intact, decreased exudative burden, overlying fascia at base.  Increased blue-green discharge again. Ostomy bag with gas and stool.  JP drain with scant drainage.  IR drain with thick purulent fluid  Skin: Cool and moist  Psychiatric: Normal affect, non-agitated, not confused       LABS:  CMP Latest Ref Rng & Units  11/18/2019 11/16/2019 11/14/2019  Glucose 70 - 99 mg/dL - 126(H) 163(H)  BUN 8 - 23 mg/dL - 26(H) 18  Creatinine 0.44 - 1.00 mg/dL 0.64 0.60 0.73  Sodium 135 - 145 mmol/L - 137 138  Potassium 3.5 - 5.1 mmol/L - 4.3 3.5  Chloride 98 - 111 mmol/L - 101 102  CO2 22 - 32 mmol/L - 25 26  Calcium 8.9 - 10.3 mg/dL - 8.6(L) 8.2(L)  Total Protein 6.5 - 8.1 g/dL - - -  Total Bilirubin 0.3 - 1.2 mg/dL - - -  Alkaline Phos 38 - 126 U/L - - -  AST 15 - 41 U/L - - -  ALT 0 - 44 U/L - - -   CBC Latest Ref Rng & Units 11/17/2019 11/16/2019 11/14/2019  WBC 4.0 - 10.5 K/uL 16.7(H) 14.5(H) 10.4  Hemoglobin 12.0 - 15.0 g/dL 9.8(L) 10.2(L) 10.6(L)  Hematocrit 36 - 46 % 31.0(L) 31.2(L) 33.0(L)  Platelets 150 - 400 K/uL 242 241 229    RADS: n/a  Assessment:   Status post Hartman's procedure for perforated diverticulitis, midline wound infection with pseudomonas.  Persistent blue-green discharge.  Resume dakin's wet to dry. Remaining skin staples removed.  S/p IR drainage of pelvic fluid.  Pending CBC, hopefully trending down.  Ok to d/c from surgery standpoint.  No change in dressing orders.

## 2019-11-18 NOTE — Progress Notes (Signed)
PT Cancellation Note  Patient Details Name: Kristina Wright MRN: [id number] DOB: Oct 29, 1955   Cancelled Treatment:     PT attempt. Pt refused requesting therapist return at later time/date. " Its been a very busy morning and I just need to rest right now. I might go to rehab today." Acute PT will continue to follow per POC progressing as able.    Willette Pa 11/18/2019, 10:31 AM

## 2019-11-18 NOTE — Progress Notes (Signed)
OT Cancellation Note  Patient Details Name: Kristina Wright MRN: [id number] DOB: 01/21/1956   Cancelled Treatment:    Reason Eval/Treat Not Completed: Patient declined, no reason specified;Fatigue/lethargy limiting ability to participate. Tx session attempted this date. Upon arrival to pt room, pt pleasantly declines therapy session endorsing fatigue and stating "They just gave me a bunch of medicine". Despite max encouragement, pt politely declines. Will continue to follow are re-attempt tx session as available and pt medically appropriate for OT.  Shara Blazing, M.S., OTR/L Ascom: [phone] 11/18/19, 2:06 PM

## 2019-11-19 LAB — CBC WITH DIFFERENTIAL/PLATELET
Abs Immature Granulocytes: 0.43 10*3/uL — ABNORMAL HIGH (ref 0.00–0.07)
Basophils Absolute: 0 10*3/uL (ref 0.0–0.1)
Basophils Relative: 0 %
Eosinophils Absolute: 0 10*3/uL (ref 0.0–0.5)
Eosinophils Relative: 0 %
HCT: 33.4 % — ABNORMAL LOW (ref 36.0–46.0)
Hemoglobin: 10.7 g/dL — ABNORMAL LOW (ref 12.0–15.0)
Immature Granulocytes: 2 %
Lymphocytes Relative: 10 %
Lymphs Abs: 1.8 10*3/uL (ref 0.7–4.0)
MCH: 31.1 pg (ref 26.0–34.0)
MCHC: 32 g/dL (ref 30.0–36.0)
MCV: 97.1 fL (ref 80.0–100.0)
Monocytes Absolute: 0.6 10*3/uL (ref 0.1–1.0)
Monocytes Relative: 3 %
Neutro Abs: 14.9 10*3/uL — ABNORMAL HIGH (ref 1.7–7.7)
Neutrophils Relative %: 85 %
Platelets: 226 10*3/uL (ref 150–400)
RBC: 3.44 MIL/uL — ABNORMAL LOW (ref 3.87–5.11)
RDW: 16.9 % — ABNORMAL HIGH (ref 11.5–15.5)
WBC: 17.7 10*3/uL — ABNORMAL HIGH (ref 4.0–10.5)
nRBC: 0.3 % — ABNORMAL HIGH (ref 0.0–0.2)

## 2019-11-19 LAB — AEROBIC/ANAEROBIC CULTURE W GRAM STAIN (SURGICAL/DEEP WOUND)

## 2019-11-19 LAB — GLUCOSE, CAPILLARY
Glucose-Capillary: 113 mg/dL — ABNORMAL HIGH (ref 70–99)
Glucose-Capillary: 185 mg/dL — ABNORMAL HIGH (ref 70–99)
Glucose-Capillary: 112 mg/dL — ABNORMAL HIGH (ref 70–99)
Glucose-Capillary: 173 mg/dL — ABNORMAL HIGH (ref 70–99)

## 2019-11-19 NOTE — TOC Progression Note (Signed)
Transition of Care [facility]) - Progression Note    Patient Details  Name: DUNYA MEINERS MRN: [id number] Date of Birth: [birth year]  [facility] CM/SW Contact  Shelbie Ammons, RN Phone Number: 11/19/2019, 11:13 AM  Clinical Narrative:   RNCM contacted by MD to see if patient can discharge to facility today. Reached out Hilda Blades with [facility] but was unable to reach her. Placed call to facility and spoke with nursing supervisor, she reported that she would investigate and get back in touch with this CM. Received return call from Hilda Blades who reports that she was under the impression patient was to remain in the hospital over the weekend and they are unable to get in touch with insurance company or accept patient prior to Monday.     Expected Discharge Plan: [place] Barriers to Discharge: Continued Medical Work up  Expected Discharge Plan and Services Expected Discharge Plan: [place] Choice: [place] arrangements for the past 2 months: Single Family Home                           HH Arranged: RN, PT, OT, Nurse's Aide, Social Work [employer] Agency: [place] ([place]) Date [place]: 11/04/19   Representative spoke with at [place]: [place] ([place]) Interventions    Readmission Risk Interventions Readmission Risk Prevention Plan 11/04/2019  PCP or Specialist Appt within 3-5 Days Complete  HRI or [place] Complete  Social Work Consult for [place] Planning/Counseling Complete  Palliative Care Screening Not Applicable  Some recent data might be hidden

## 2019-11-19 NOTE — Progress Notes (Signed)
Physical Therapy Treatment Patient Details Name: Kristina Wright MRN: [id number] DOB: [birth year] Today's Date: 11/19/2019    History of Present Illness 64 yo female admitted with generalized weakness and diarrhea. Found to have bowel perforation s/p exploratory laparotomy requiring bowel resection along with colostomy and remained mechanically intubated postop. Patient extubated 10/31/19. History of lung cancer with brain mets, COPD, bilateral LE wounds     PT Comments    Pt was long sitting in bed upon arriving. She is very pleasant and cooperative throughout. Excites/anxious about upcoming DC. Pt was able to transition from long sit to short sit EOB with mod assist + increased time. Heavy use of bed rails. Sat EOB x ~ 10 minutes. Performed there ex in supine and while seated EOB. Overall tolerated session well. Continued recommendation for continued skilled PT at SNF. Pt is agreeable and motivated to participate. At conclusion of session, pt was repositioned in bed with bed alarm in place, call bell in reach, and pt resting comfortably. Acute PT will continue to follow per POC.     Follow Up Recommendations  SNF     Equipment Recommendations  Rolling walker with 5" wheels;3in1 (PT);Wheelchair (measurements PT);Hospital bed;Wheelchair cushion (measurements PT);Other (comment)    Recommendations for Other Services       Precautions / Restrictions Precautions Precautions: Fall Precaution Comments: multiple line/tubes/colostomy/drains Restrictions Weight Bearing Restrictions: No    Mobility  Bed Mobility Overal bed mobility: Needs Assistance Bed Mobility: Supine to Sit;Sit to Supine Rolling: Mod assist   Supine to sit: Mod assist Sit to supine: Mod assist   General bed mobility comments: Increased time to perform  Transfers                 General transfer comment: pt unwilling to trial today 2/2 to fatigue with just sitting and performing ther ex at  EOB  Ambulation/Gait                 Stairs             Wheelchair Mobility    Modified Rankin (Stroke Patients Only)       Balance Overall balance assessment: Needs assistance Sitting-balance support: Feet supported Sitting balance-Leahy Scale: Good Sitting balance - Comments: no LOB seated EOB with feet support only                                    Cognition Arousal/Alertness: Awake/alert Behavior During Therapy: WFL for tasks assessed/performed Overall Cognitive Status: Within Functional Limits for tasks assessed                                 General Comments: Pt pleasant and engaged throughout session      Exercises General Exercises - Lower Extremity Ankle Circles/Pumps: AROM;Strengthening;Both;10 reps;Supine Quad Sets: AROM;10 reps Gluteal Sets: AROM;10 reps Long Arc Quad: AROM;10 reps;Both Hip Flexion/Marching: Seated;20 reps;Both;Strengthening;AROM    General Comments        Pertinent Vitals/Pain Pain Assessment: No/denies pain Pain Score: 0-No pain Faces Pain Scale: No hurt Pain Location: abdomen around incisional site Pain Descriptors / Indicators: Discomfort Pain Intervention(s): Limited activity within patient's tolerance;Monitored during session    Home Living                      Prior Function  PT Goals (current goals can now be found in the care plan section) Acute Rehab PT Goals Patient Stated Goal: to go home  Progress towards PT goals: Progressing toward goals    Frequency    Min 2X/week      PT Plan Current plan remains appropriate    Co-evaluation     PT goals addressed during session: Mobility/safety with mobility;Strengthening/ROM        AM-PAC PT "6 Clicks" Mobility   Outcome Measure  Help needed turning from your back to your side while in a flat bed without using bedrails?: A Lot Help needed moving from lying on your back to sitting on the side  of a flat bed without using bedrails?: A Lot Help needed moving to and from a bed to a chair (including a wheelchair)?: Total Help needed standing up from a chair using your arms (e.g., wheelchair or bedside chair)?: Total Help needed to walk in hospital room?: Total Help needed climbing 3-5 steps with a railing? : Total 6 Click Score: 8    End of Session   Activity Tolerance: Patient tolerated treatment well Patient left: in bed;with bed alarm set;with call bell/phone within reach Nurse Communication: Mobility status PT Visit Diagnosis: Muscle weakness (generalized) (M62.81);Difficulty in walking, not elsewhere classified (R26.2)     Time: 1050-1109 PT Time Calculation (min) (ACUTE ONLY): 19 min  Charges:  $Therapeutic Exercise: 8-22 mins                     Julaine Fusi PTA 11/19/19, 11:30 AM

## 2019-11-19 NOTE — Progress Notes (Signed)
PROGRESS NOTE    Kristina Wright  SEG:[id number] DOB: 11-15-1955 DOA: 10/30/2019 PCP: Juline Patch, MD   Brief Narrative: Kristina Wright is a 64 y.o. female with a history of hypothyroidism, stage IV lung cancer with brain metastasis, hypertension, hyperlipidemia, depression, cervical cancer.  Patient presents secondary to diarrhea and abdominal pain.  CT abdomen and pelvis was revealing for a perforated bowel secondary to perforated diverticulitis.  Patient was emergently managed in the OR and underwent exploratory laparotomy with lysis of adhesions, abdominal washout, sigmoid colon resection with colostomy.  Hospitalization was complicated by metabolic encephalopathy secondary to vasogenic edema in setting of metastatic lesions which responded to steroid therapy.  Hospitalization was also complicated by abdominal wound infection managed on IV antibiotics and wound care per general surgery.  Initial plan for discharge home with hospice which has now changed. Patient has decided on full scope treatment and plan is for discharge to SNF with outpatient hematology/oncology follow-up.   Assessment & Plan:   Active Problems:   Malignant neoplasm of lung (HCC)   Endotracheal tube present   Bowel perforation (HCC)   Pressure injury of skin   Brain edema (HCC)   Normocytic anemia   Palliative care by specialist   Encounter for orogastric tube placement   DNR (do not resuscitate)   Immunosuppression due to chronic steroid use   Ileus ([place])   Perforated diverticulitis Patient underwent emergent exploratory laparotomy with lysis of adhesions and sigmoid colon resection with colostomy complicated by midline wound infection.  Postop, patient was managed in ICU while intubated.  She was extubated on 10/31/2019.  Patient was managed on NG tube which was discontinued on 11/02/2019.  Diet has been advanced to regular.  She was started on IV Zosyn for her wound infection.  Repeat CT scan significant for  fluid collection which should be evacuated by current drain in place per general surgery recommendations. -Wound care per general surgery/WOC White count stabilizing 17.7 down from 18.3. stat CT -persistent fluid collection in the posterior pelvis similar or minimally increased since the prior CT.  The drainage catheter does not contact the fluid collection in the posterior pelvis.  Ovarian teratoma on the left noted needs outpatient follow-up.  Status post drain placement by IR culture showing multiple species. Discussed with case management patient is medically stable to be discharged however the facility will not be able to accept her till Monday.  Abdominal wound infection Concern for possible Pseudomonas infection.  No cultures obtained.  Patient has been managed on IV Zosyn with recommendations per general surgery to transition to oral antibiotics. No organism identified so patient transitioned to Ciprofloxacin. Patient has been treated for 2 weeks. Discontinued antibiotics.  Acute metabolic encephalopathy Secondary to vasogenic edema from metastatic lesions. Improved with steroid treatment. She was initially managed in ICU and transitioned to comfort measures before mental status drastically improved. Currently at baseline.  Vasogenic edema Secondary to metastatic lesions -Continue Decadron 4 mg TID; continue on discharge  Stage IV lung cancer with metastasis to brain Patient follows with oncology. Started on [place] for seizure prophylaxis. Previously DNR and now full code. Patient is wanting full scope care and treatment options for cancer. Medical oncology consulted and has recommended restarting osimertinib. -Continue osimertinib  Acute blood loss anemia In setting of above. Patient required 1 unit of PRBC. Hemoglobin is stable.  Mouth twitching Unknown etiology. Possibly facial spasms. Resolved spontaneously. Evaluated by neurology who did not think episode was related to seizure  activity.  Hypothyroidism -Continue Synthroid  Pressure injury Mid-sacrum, unsure if present on admission   DVT prophylaxis: Lovenox Code Status:   Code Status: Full Code Family Communication: None at bedside Disposition Plan: Discharge to SNF on Monday  consultants:   General surgery  Neurology  PCCM  Hematology/Oncology  Palliative care medicine  Procedures:   EXPLORATORY LAPAROTOMY; LYSIS OF ADHESIONS; ABDOMINAL WASHOUT; SIDMOID COLON RESECTION WITH COLOSTOMY (10/30/2019)  Antimicrobials:  Zosyn (7/11>>7/21)   Ciprofloxacin (7/22>>7/25)   Subjective: Feeling better decreased pain anxious to go to rehab  Objective: Vitals:   11/18/19 1202 11/18/19 2005 11/19/19 0419 11/19/19 1144  BP: 96/83 (!) 131/67 (!) 149/75 121/74  Pulse: 91 86 77 95  Resp: 20 20 20    Temp: 98.1 F (36.7 C) 97.8 F (36.6 C) 97.6 F (36.4 C) 97.8 F (36.6 C)  TempSrc:   Oral Oral  SpO2: 96% 97% 96% 96%  Weight:      Height:        Intake/Output Summary (Last 24 hours) at 11/19/2019 1159 Last data filed at 11/19/2019 0900 Gross per 24 hour  Intake 600 ml  Output 850 ml  Net -250 ml   Filed Weights   10/30/19 1452 10/30/19 2248 11/06/19 1538  Weight: 103 kg 100.6 kg 101.8 kg    Examination:  General exam: Appears calm and comfortable Respiratory system: Clear to auscultation. Respiratory effort normal. Cardiovascular system: S1 & S2 heard, RRR. No murmurs, rubs, gallops or clicks. Gastrointestinal system: Abdomen is distended, soft and nontender. Ostomy with air and liquid brown stool. Abdominal wound dressing is CDI. Normal bowel sounds heard.2 drain in place Central nervous system: Alert and oriented. No focal neurological deficits. Musculoskeletal: BLE foot edema. No calf tenderness Skin: No cyanosis. No rashes Psychiatry: Judgement and insight appear normal. Mood & affect appropriate.     Data Reviewed: I have personally reviewed following labs and imaging  studies  CBC Lab Results  Component Value Date   WBC 17.7 (H) 11/19/2019   RBC 3.44 (L) 11/19/2019   HGB 10.7 (L) 11/19/2019   HCT 33.4 (L) 11/19/2019   MCV 97.1 11/19/2019   MCH 31.1 11/19/2019   PLT 226 11/19/2019   MCHC 32.0 11/19/2019   RDW 16.9 (H) 11/19/2019   LYMPHSABS 1.8 11/19/2019   MONOABS 0.6 11/19/2019   EOSABS 0.0 11/19/2019   BASOSABS 0.0 53/97/6734     Last metabolic panel Lab Results  Component Value Date   NA 137 11/16/2019   K 4.3 11/16/2019   CL 101 11/16/2019   CO2 25 11/16/2019   BUN 26 (H) 11/16/2019   CREATININE 0.64 11/18/2019   GLUCOSE 126 (H) 11/16/2019   GFRNONAA >60 11/18/2019   GFRAA >60 11/18/2019   CALCIUM 8.6 (L) 11/16/2019   PHOS 3.6 11/16/2019   PROT 6.7 10/30/2019   ALBUMIN 2.2 (L) 10/30/2019   LABGLOB 2.8 08/02/2019   AGRATIO 1.5 08/02/2019   BILITOT 0.9 10/30/2019   ALKPHOS 100 10/30/2019   AST 26 10/30/2019   ALT 43 10/30/2019   ANIONGAP 11 11/16/2019    CBG (last 3)  Recent Labs    11/18/19 1637 11/18/19 2149 11/19/19 0747  GLUCAP 137* 153* 113*     GFR: Estimated Creatinine Clearance: 93.7 mL/min (by C-G formula based on SCr of 0.64 mg/dL).  Coagulation Profile: No results for input(s): INR, PROTIME in the last 168 hours.  Recent Results (from the past 240 hour(s))  Aerobic/Anaerobic Culture (surgical/deep wound)     Status: Abnormal   Collection Time:  11/17/19  2:40 PM   Specimen: Abscess  Result Value Ref Range Status   Specimen Description   Final    ABSCESS Performed at [facility], [address], [place], [place] [address]    Special Requests   Final    RIGHT TG DRAIN Performed at [facility], [place], [place] [address]    Gram Stain   Final    ABUNDANT WBC PRESENT, PREDOMINANTLY PMN ABUNDANT GRAM POSITIVE COCCI ABUNDANT GRAM NEGATIVE RODS FEW GRAM POSITIVE RODS Performed at [facility], [place] [address], [place], [place] [address]    Culture (A)   Final    MULTIPLE ORGANISMS PRESENT, NONE PREDOMINANT MIXED ANAEROBIC FLORA PRESENT.  CALL LAB IF FURTHER IID REQUIRED.    Report Status 11/19/2019 FINAL  Final        Radiology Studies: CT IMAGE GUIDED DRAINAGE BY PERCUTANEOUS CATHETER  Result Date: 11/17/2019 INDICATION: History of perforated diverticulitis, post Hartmann's procedure, currently postoperative day 17 (Dr. Lysle Pearl). CT scan of the abdomen pelvis performed 11/16/2018 for evaluation of leukocytosis demonstrated an indeterminate fluid collection with the pelvic cul-de-sac. As such, request made for a CT-guided pelvic fluid collection aspiration and/or drainage catheter placement for infection source control purposes. EXAM: CT IMAGE GUIDED DRAINAGE BY PERCUTANEOUS CATHETER COMPARISON:  CT abdomen pelvis-11/16/2019; 11/08/2019 MEDICATIONS: The patient is currently admitted to the hospital and receiving intravenous antibiotics. The antibiotics were administered within an appropriate time frame prior to the initiation of the procedure. ANESTHESIA/SEDATION: Moderate (conscious) sedation was employed during this procedure. A total of Versed 2 mg and Fentanyl 100 mcg was administered intravenously. Moderate Sedation Time: 10 minutes. The patient's level of consciousness and vital signs were monitored continuously by radiology nursing throughout the procedure under my direct supervision. CONTRAST:  None COMPLICATIONS: None immediate. PROCEDURE: Informed written consent was obtained from the patient after a discussion of the risks, benefits and alternatives to treatment. The patient was placed left lateral decubitus on the CT gantry and a pre procedural CT was performed re-demonstrating the known abscess/fluid collection within the pelvic cul-de-sac with dominant collection measuring approximately 6.7 x 4.8 cm (image 31, series 5). The procedure was planned. A timeout was performed prior to the initiation of the procedure. The skin overlying the right  buttocks was prepped and draped in the usual sterile fashion. The overlying soft tissues were anesthetized with 1% lidocaine with epinephrine. Utilizing a right trans gluteal approach, appropriate trajectory was planned with the use of a 22 gauge spinal needle. An 18 gauge trocar needle was advanced into the abscess/fluid collection and a short Amplatz super stiff wire was coiled within the collection. Appropriate positioning was confirmed with a limited CT scan. The tract was serially dilated allowing placement of a 10 [place] all-purpose drainage catheter. Appropriate positioning was confirmed with a limited postprocedural CT scan. Approximately 60 ml of purulent fluid was aspirated. The tube was connected to a drainage bag and sutured in place. A dressing was placed. The patient tolerated the procedure well without immediate post procedural complication. IMPRESSION: Successful CT guided placement of a 10 [place] all purpose drain catheter into the pelvic abscess via right trans gluteal approach with aspiration of 60 mL of purulent fluid. Samples were sent to the laboratory as requested by the ordering clinical team. Electronically Signed   By: Sandi Mariscal M.D.   On: 11/17/2019 15:19        Scheduled Meds: . sodium chloride   Intravenous Once  . acyclovir  400 mg Oral BID  . atorvastatin  10 mg Oral Daily  . busPIRone  5 mg Oral Daily  . Chlorhexidine Gluconate Cloth  6 each Topical Daily  . clopidogrel  75 mg Oral Daily  . collagenase   Topical Daily  . dexamethasone  4 mg Oral Q8H  . enoxaparin (LOVENOX) injection  40 mg Subcutaneous Q24H  . famotidine  20 mg Oral Daily  . feeding supplement (ENSURE ENLIVE)  237 mL Oral TID BM  . FLUoxetine  10 mg Oral Daily  . lisinopril  20 mg Oral Daily   And  . hydrochlorothiazide  25 mg Oral Daily  . insulin aspart  0-9 Units Subcutaneous TID WC  . levETIRAcetam  500 mg Oral BID  . levothyroxine  112 mcg Oral Daily  . multivitamin with minerals  1  tablet Oral Daily  . nystatin cream   Topical BID  . osimertinib mesylate  80 mg Oral Daily  . sodium chloride flush  10-40 mL Intracatheter Q12H  . sodium chloride flush  5 mL Intracatheter Q8H  . umeclidinium-vilanterol  1 puff Inhalation Daily   Continuous Infusions: . sodium chloride Stopped (11/07/19 1509)     LOS: 20 days     Landis Gandy MD 11/19/2019, 11:59 AM  If 7PM-7AM, please contact night-coverage [email or web address]

## 2019-11-20 DIAGNOSIS — R188 Other ascites: Secondary | ICD-10-CM

## 2019-11-20 LAB — CBC WITH DIFFERENTIAL/PLATELET
Abs Immature Granulocytes: 0.35 10*3/uL — ABNORMAL HIGH (ref 0.00–0.07)
Basophils Absolute: 0 10*3/uL (ref 0.0–0.1)
Basophils Relative: 0 %
Eosinophils Absolute: 0 10*3/uL (ref 0.0–0.5)
Eosinophils Relative: 0 %
HCT: 31.4 % — ABNORMAL LOW (ref 36.0–46.0)
Hemoglobin: 9.9 g/dL — ABNORMAL LOW (ref 12.0–15.0)
Immature Granulocytes: 2 %
Lymphocytes Relative: 9 %
Lymphs Abs: 1.4 10*3/uL (ref 0.7–4.0)
MCH: 30.6 pg (ref 26.0–34.0)
MCHC: 31.5 g/dL (ref 30.0–36.0)
MCV: 96.9 fL (ref 80.0–100.0)
Monocytes Absolute: 0.6 10*3/uL (ref 0.1–1.0)
Monocytes Relative: 4 %
Neutro Abs: 13.8 10*3/uL — ABNORMAL HIGH (ref 1.7–7.7)
Neutrophils Relative %: 85 %
Platelets: 236 10*3/uL (ref 150–400)
RBC: 3.24 MIL/uL — ABNORMAL LOW (ref 3.87–5.11)
RDW: 17.1 % — ABNORMAL HIGH (ref 11.5–15.5)
WBC: 16.2 10*3/uL — ABNORMAL HIGH (ref 4.0–10.5)
nRBC: 0.1 % (ref 0.0–0.2)

## 2019-11-20 LAB — GLUCOSE, CAPILLARY
Glucose-Capillary: 132 mg/dL — ABNORMAL HIGH (ref 70–99)
Glucose-Capillary: 143 mg/dL — ABNORMAL HIGH (ref 70–99)
Glucose-Capillary: 162 mg/dL — ABNORMAL HIGH (ref 70–99)
Glucose-Capillary: 162 mg/dL — ABNORMAL HIGH (ref 70–99)

## 2019-11-20 NOTE — Progress Notes (Signed)
PROGRESS NOTE    Kristina Wright  DSK:[id number] DOB: [birth year] DOA: 10/30/2019 PCP: Kristina Patch, MD   Brief Narrative: Kristina Wright is a 64 y.o. female with a history of hypothyroidism, stage IV lung cancer with brain metastasis, hypertension, hyperlipidemia, depression, cervical cancer.  Patient presents secondary to diarrhea and abdominal pain.  CT abdomen and pelvis was revealing for a perforated bowel secondary to perforated diverticulitis.  Patient was emergently managed in the OR and underwent exploratory laparotomy with lysis of adhesions, abdominal washout, sigmoid colon resection with colostomy.  Hospitalization was complicated by metabolic encephalopathy secondary to vasogenic edema in setting of metastatic lesions which responded to steroid therapy.  Hospitalization was also complicated by abdominal wound infection managed on IV antibiotics and wound care per general surgery.  Initial plan for discharge home with hospice which has now changed. Patient has decided on full scope treatment and plan is for discharge to SNF with outpatient hematology/oncology follow-up.   Assessment & Plan:   Active Problems:   Malignant neoplasm of lung (HCC)   Endotracheal tube present   Bowel perforation (HCC)   Pressure injury of skin   Brain edema (HCC)   Normocytic anemia   Palliative care by specialist   Encounter for orogastric tube placement   DNR (do not resuscitate)   Immunosuppression due to chronic steroid use   Ileus ([place])   Perforated diverticulitis Patient underwent emergent exploratory laparotomy with lysis of adhesions and sigmoid colon resection with colostomy complicated by midline wound infection.  Postop, patient was managed in ICU while intubated.  She was extubated on 10/31/2019.  Patient was managed on NG tube which was discontinued on 11/02/2019.  Diet has been advanced to regular.  She was started on IV Zosyn for her wound infection.  Repeat CT scan significant for  fluid collection which should be evacuated by current drain in place per general surgery recommendations. -Wound care per general surgery/WOC White count stabilizing  16.2 from 17.7 down from 18.3. stat CT -persistent fluid collection in the posterior pelvis similar or minimally increased since the prior CT.  The drainage catheter does not contact the fluid collection in the posterior pelvis.  Ovarian teratoma on the left noted needs outpatient follow-up.  Status post drain placement by IR culture showing multiple species. Discussed with case management patient is medically stable to be discharged however the facility will not be able to accept her till Monday.  Abdominal wound infection Concern for possible Pseudomonas infection.  No cultures obtained.  Patient has been managed on IV Zosyn with recommendations per general surgery to transition to oral antibiotics. No organism identified so patient transitioned to Ciprofloxacin. Patient has been treated for 2 weeks. Discontinued antibiotics.  Acute metabolic encephalopathy Secondary to vasogenic edema from metastatic lesions. Improved with steroid treatment. She was initially managed in ICU and transitioned to comfort measures before mental status drastically improved. Currently at baseline.  Vasogenic edema Secondary to metastatic lesions -Continue Decadron 4 mg TID; continue on discharge  Stage IV lung cancer with metastasis to brain Patient follows with oncology. Started on [place] for seizure prophylaxis. Previously DNR and now full code. Patient is wanting full scope care and treatment options for cancer. Medical oncology consulted and has recommended restarting osimertinib. -Continue osimertinib  Acute blood loss anemia In setting of above. Patient required 1 unit of PRBC. Hemoglobin is stable.  Mouth twitching Unknown etiology. Possibly facial spasms. Resolved spontaneously. Evaluated by neurology who did not think episode was related  to seizure activity.  Hypothyroidism -Continue Synthroid  Pressure injury Mid-sacrum, unsure if present on admission   DVT prophylaxis: Lovenox Code Status:   Code Status: Full Code Family Communication: None at bedside Disposition Plan: Discharge to SNF on Monday  consultants:   General surgery  Neurology  PCCM  Hematology/Oncology  Palliative care medicine  Procedures:   EXPLORATORY LAPAROTOMY; LYSIS OF ADHESIONS; ABDOMINAL WASHOUT; SIDMOID COLON RESECTION WITH COLOSTOMY (10/30/2019)  Antimicrobials:  Zosyn (7/11>>7/21)   Ciprofloxacin (7/22>>7/25)   Subjective: Feeling better decreased pain anxious to go to rehab  Objective: Vitals:   11/19/19 1932 11/19/19 2203 11/20/19 0352 11/20/19 1203  BP: (!) 131/70 (!) 152/73 (!) 136/73 (!) 145/73  Pulse: 87 88 84 92  Resp: 18 20 18 20   Temp: 98.3 F (36.8 C) 97.7 F (36.5 C) 97.9 F (36.6 C) 97.8 F (36.6 C)  TempSrc: Oral Oral  Oral  SpO2: 97% 97% 96% 96%  Weight:      Height:        Intake/Output Summary (Last 24 hours) at 11/20/2019 1242 Last data filed at 11/20/2019 1027 Gross per 24 hour  Intake 370 ml  Output 2783 ml  Net -2413 ml   Filed Weights   10/30/19 1452 10/30/19 2248 11/06/19 1538  Weight: 103 kg 100.6 kg 101.8 kg    Examination:  General exam: Appears calm and comfortable Respiratory system: Clear to auscultation. Respiratory effort normal. Cardiovascular system: S1 & S2 heard, RRR. No murmurs, rubs, gallops or clicks. Gastrointestinal system: Abdomen is distended, soft and nontender. Ostomy with air and liquid brown stool. Abdominal wound dressing is CDI. Normal bowel sounds heard.2 drain in place Central nervous system: Alert and oriented. No focal neurological deficits. Musculoskeletal: BLE foot edema. No calf tenderness Skin: No cyanosis. No rashes Psychiatry: Judgement and insight appear normal. Mood & affect appropriate.     Data Reviewed: I have personally reviewed following  labs and imaging studies  CBC Lab Results  Component Value Date   WBC 16.2 (H) 11/20/2019   RBC 3.24 (L) 11/20/2019   HGB 9.9 (L) 11/20/2019   HCT 31.4 (L) 11/20/2019   MCV 96.9 11/20/2019   MCH 30.6 11/20/2019   PLT 236 11/20/2019   MCHC 31.5 11/20/2019   RDW 17.1 (H) 11/20/2019   LYMPHSABS 1.4 11/20/2019   MONOABS 0.6 11/20/2019   EOSABS 0.0 11/20/2019   BASOSABS 0.0 19/37/9024     Last metabolic panel Lab Results  Component Value Date   NA 137 11/16/2019   K 4.3 11/16/2019   CL 101 11/16/2019   CO2 25 11/16/2019   BUN 26 (H) 11/16/2019   CREATININE 0.64 11/18/2019   GLUCOSE 126 (H) 11/16/2019   GFRNONAA >60 11/18/2019   GFRAA >60 11/18/2019   CALCIUM 8.6 (L) 11/16/2019   PHOS 3.6 11/16/2019   PROT 6.7 10/30/2019   ALBUMIN 2.2 (L) 10/30/2019   LABGLOB 2.8 08/02/2019   AGRATIO 1.5 08/02/2019   BILITOT 0.9 10/30/2019   ALKPHOS 100 10/30/2019   AST 26 10/30/2019   ALT 43 10/30/2019   ANIONGAP 11 11/16/2019    CBG (last 3)  Recent Labs    11/19/19 2113 11/20/19 0741 11/20/19 1206  GLUCAP 173* 132* 143*     GFR: Estimated Creatinine Clearance: 93.7 mL/min (by C-G formula based on SCr of 0.64 mg/dL).  Coagulation Profile: No results for input(s): INR, PROTIME in the last 168 hours.  Recent Results (from the past 240 hour(s))  Aerobic/Anaerobic Culture (surgical/deep wound)  Status: Abnormal   Collection Time: 11/17/19  2:40 PM   Specimen: Abscess  Result Value Ref Range Status   Specimen Description   Final    ABSCESS Performed at [facility], [address] [place], [place] [address]    Special Requests   Final    RIGHT TG DRAIN Performed at [facility], [address] [place], [place] [address]    Gram Stain   Final    ABUNDANT WBC PRESENT, PREDOMINANTLY PMN ABUNDANT GRAM POSITIVE COCCI ABUNDANT GRAM NEGATIVE RODS FEW GRAM POSITIVE RODS Performed at [facility], [place] [address] [place], [place] [address]      Culture (A)  Final    MULTIPLE ORGANISMS PRESENT, NONE PREDOMINANT MIXED ANAEROBIC FLORA PRESENT.  CALL LAB IF FURTHER IID REQUIRED.    Report Status 11/19/2019 FINAL  Final        Radiology Studies: No results found.      Scheduled Meds: . sodium chloride   Intravenous Once  . acyclovir  400 mg Oral BID  . atorvastatin  10 mg Oral Daily  . busPIRone  5 mg Oral Daily  . Chlorhexidine Gluconate Cloth  6 each Topical Daily  . clopidogrel  75 mg Oral Daily  . collagenase   Topical Daily  . dexamethasone  4 mg Oral Q8H  . enoxaparin (LOVENOX) injection  40 mg Subcutaneous Q24H  . famotidine  20 mg Oral Daily  . feeding supplement (ENSURE ENLIVE)  237 mL Oral TID BM  . FLUoxetine  10 mg Oral Daily  . lisinopril  20 mg Oral Daily   And  . hydrochlorothiazide  25 mg Oral Daily  . insulin aspart  0-9 Units Subcutaneous TID WC  . levETIRAcetam  500 mg Oral BID  . levothyroxine  112 mcg Oral Daily  . multivitamin with minerals  1 tablet Oral Daily  . nystatin cream   Topical BID  . osimertinib mesylate  80 mg Oral Daily  . sodium chloride flush  10-40 mL Intracatheter Q12H  . sodium chloride flush  5 mL Intracatheter Q8H  . umeclidinium-vilanterol  1 puff Inhalation Daily   Continuous Infusions: . sodium chloride Stopped (11/07/19 1509)     LOS: 21 days     Landis Gandy MD 11/20/2019, 12:42 PM  If 7PM-7AM, please contact night-coverage [email or web address]

## 2019-11-21 ENCOUNTER — Telehealth: Payer: Self-pay

## 2019-11-21 ENCOUNTER — Ambulatory Visit: Payer: BLUE CROSS/BLUE SHIELD

## 2019-11-21 ENCOUNTER — Other Ambulatory Visit: Payer: BLUE CROSS/BLUE SHIELD

## 2019-11-21 DIAGNOSIS — C3490 Malignant neoplasm of unspecified part of unspecified bronchus or lung: Secondary | ICD-10-CM

## 2019-11-21 LAB — CBC WITH DIFFERENTIAL/PLATELET
Abs Immature Granulocytes: 0.4 10*3/uL — ABNORMAL HIGH (ref 0.00–0.07)
Basophils Absolute: 0 10*3/uL (ref 0.0–0.1)
Basophils Relative: 0 %
Eosinophils Absolute: 0 10*3/uL (ref 0.0–0.5)
Eosinophils Relative: 0 %
HCT: 31.3 % — ABNORMAL LOW (ref 36.0–46.0)
Hemoglobin: 10.4 g/dL — ABNORMAL LOW (ref 12.0–15.0)
Immature Granulocytes: 3 %
Lymphocytes Relative: 9 %
Lymphs Abs: 1.5 10*3/uL (ref 0.7–4.0)
MCH: 31.5 pg (ref 26.0–34.0)
MCHC: 33.2 g/dL (ref 30.0–36.0)
MCV: 94.8 fL (ref 80.0–100.0)
Monocytes Absolute: 0.9 10*3/uL (ref 0.1–1.0)
Monocytes Relative: 5 %
Neutro Abs: 13.2 10*3/uL — ABNORMAL HIGH (ref 1.7–7.7)
Neutrophils Relative %: 83 %
Platelets: 223 10*3/uL (ref 150–400)
RBC: 3.3 MIL/uL — ABNORMAL LOW (ref 3.87–5.11)
RDW: 17.2 % — ABNORMAL HIGH (ref 11.5–15.5)
WBC: 15.9 10*3/uL — ABNORMAL HIGH (ref 4.0–10.5)
nRBC: 0.1 % (ref 0.0–0.2)

## 2019-11-21 LAB — GLUCOSE, CAPILLARY
Glucose-Capillary: 110 mg/dL — ABNORMAL HIGH (ref 70–99)
Glucose-Capillary: 189 mg/dL — ABNORMAL HIGH (ref 70–99)

## 2019-11-21 MED ORDER — ACYCLOVIR 200 MG PO CAPS: 400.0000 mg | ORAL_CAPSULE | Freq: Two times a day (BID) | ORAL | 0 refills | Status: AC

## 2019-11-21 MED ORDER — FUROSEMIDE 20 MG PO TABS
20.0000 mg | ORAL_TABLET | Freq: Every day | ORAL | 3 refills | Status: AC
Start: 2019-11-21 — End: ?

## 2019-11-21 MED ORDER — DAKINS (1/4 STRENGTH) 0.125 % EX SOLN
Freq: Every morning | CUTANEOUS | Status: DC
  Filled 2019-11-21: qty 473

## 2019-11-21 MED ORDER — DEXAMETHASONE 4 MG PO TABS: 4.0000 mg | ORAL_TABLET | Freq: Two times a day (BID) | ORAL | 0 refills | Status: DC

## 2019-11-21 MED ORDER — LISINOPRIL 5 MG PO TABS: 5.0000 mg | ORAL_TABLET | Freq: Every day | ORAL | 11 refills | Status: AC

## 2019-11-21 MED ORDER — DEXAMETHASONE 4 MG PO TABS
4.0000 mg | ORAL_TABLET | Freq: Two times a day (BID) | ORAL | Status: DC
  Administered 2019-11-21: 4 mg via ORAL
  Filled 2019-11-21: qty 1

## 2019-11-21 MED ORDER — DAKINS (1/4 STRENGTH) 0.125 % EX SOLN: Freq: Every morning | CUTANEOUS | 0 refills | Status: AC

## 2019-11-21 MED ORDER — HYDROCODONE-ACETAMINOPHEN 5-325 MG PO TABS: 1.0000 | ORAL_TABLET | Freq: Four times a day (QID) | ORAL | 0 refills | Status: DC | PRN

## 2019-11-21 NOTE — TOC Progression Note (Signed)
Transition of Care [facility]) - Progression Note    Patient Details  Name: Kristina Wright MRN: [id number] Date of Birth: 08/01/1955  Transition of Care [facility]) CM/SW [place], LCSW Phone Number: 11/21/2019, 11:09 AM  Clinical Narrative: Insurance authorization is still active through today. Patient and sister Kendrick Fries are aware of plan for discharge today. Sister coming by hospital to have her sign deductible check. Will set up transport when that is complete.  Expected Discharge Plan: [place] Barriers to Discharge: Continued Medical Work up  Expected Discharge Plan and Services Expected Discharge Plan: [place] Choice: [place] arrangements for the past 2 months: Single Family Home Expected Discharge Date: 11/21/19                         HH Arranged: RN, PT, OT, Nurse's Aide, Social Work [employer] Agency: [place] ([place]) Date [place]: 11/04/19   Representative spoke with at [place]: [place] ([place]) Interventions    Readmission Risk Interventions Readmission Risk Prevention Plan 11/04/2019  PCP or Specialist Appt within 3-5 Days Complete  HRI or [place] Complete  Social Work Consult for [place] Planning/Counseling Complete  Palliative Care Screening Not Applicable  Some recent data might be hidden

## 2019-11-21 NOTE — Discharge Summary (Addendum)
Physician Discharge Summary  KENITHA GLENDINNING DXI:[id number] DOB: 05/06/1955 DOA: 10/30/2019  PCP: Juline Patch, MD  Admit date: 10/30/2019 Discharge date: 11/21/2019  Admitted From: Home Disposition: Nursing home Recommendations for Outpatient Follow-up:  1. Follow up with PCP in 1-2 weeks 2. Please obtain BMP/CBC in one week 3. Please follow up with general surgery and oncology 4. Follow-up with wound care physician at the facility  Home Health: None Equipment/Devices: None  Discharge Condition stable CODE STATUS full code Diet recommendation: Cardiac Brief/Interim Summary:Kristina Wright is a 64 y.o. female with a history of hypothyroidism, stage IV lung cancer with brain metastasis, hypertension, hyperlipidemia, depression, cervical cancer.  Patient presents secondary to diarrhea and abdominal pain.  CT abdomen and pelvis was revealing for a perforated bowel secondary to perforated diverticulitis.  Patient was emergently managed in the OR and underwent exploratory laparotomy with lysis of adhesions, abdominal washout, sigmoid colon resection with colostomy.  Hospitalization was complicated by metabolic encephalopathy secondary to vasogenic edema in setting of metastatic lesions which responded to steroid therapy.  Hospitalization was also complicated by abdominal wound infection managed on IV antibiotics and wound care per general surgery.  Initial plan for discharge home with hospice which has now changed. Patient has decided on full scope treatment and plan is for discharge to SNF with outpatient hematology/oncology follow-up.  Discharge Diagnoses:  Active Problems:   Malignant neoplasm of lung (HCC)   Endotracheal tube present   Bowel perforation (HCC)   Pressure injury of skin   Brain edema (HCC)   Normocytic anemia   Palliative care by specialist   Encounter for orogastric tube placement   DNR (do not resuscitate)   Immunosuppression due to chronic steroid use   Ileus ([place])    Abdominal fluid collection   Perforated diverticulitis Patient underwent emergent exploratory laparotomy with lysis of adhesions and sigmoid colon resection with colostomy complicated by midline wound infection.  Postop, patient was managed in ICU while intubated.  She was extubated on 10/31/2019.  Patient was managed on NG tube which was discontinued on 11/02/2019.  Diet has been advanced to regular.  She was started on IV Zosyn for her wound infection  Repeat  CT -persistent fluid collection in the posterior pelvis similar or minimally increased since the prior CT.  The drainage catheter does not contact the fluid collection in the posterior pelvis.  Ovarian teratoma on the left noted needs outpatient follow-up.  Status post drain placement by IR culture showing multiple species. WBC is trending down.  Abdominal wound infection-patient was treated with Zosyn and then ciprofloxacin to finish a 2-week course.  Currently not on any antibiotics.  Acute metabolic encephalopathy Secondary to vasogenic edema from metastatic lesions. Improved with steroid treatment.  Continue Decadron twice a day.  Taper slowly.   Vasogenic edema continue Decadron 4 mg twice a day.  Stage IV lung cancer with metastasis to brain Patient follows with oncology. Started on [place] for seizure prophylaxis. Medical oncology consulted and has recommended restarting osimertinib.  Acute blood loss anemia  Patient required 1 unit of PRBC. Hemoglobin is stable.  Hypothyroidism -Continue Synthroid  Pressure injury-mid sacrum-continue wound care as per orders. She has multiple drains in place the right buttock drainage catheter needs to be flushed 3 times a day with 5 mL of sodium chloride. Wound care orders to bilateral legs and surgical incision-apply Santyl for bilateral legs and Dakin's solution to surgical dressing.  Please have the wound care physician at the facility see her  Pressure Injury 11/01/19 Sacrum  Mid Stage 2 -  Partial thickness loss of dermis presenting as a shallow open injury with a red, pink wound bed without slough. pink (Active)  11/01/19 1856  Location: Sacrum  Location Orientation: Mid  Staging: Stage 2 -  Partial thickness loss of dermis presenting as a shallow open injury with a red, pink wound bed without slough.  Wound Description (Comments): pink  Present on Admission:     Nutrition Problem: Inadequate oral intake Etiology: acute illness    Signs/Symptoms: other (comment) (pt on NPO/clear liquid diet since admit)     Interventions: Boost Breeze  Estimated body mass index is 33.14 kg/m as calculated from the following:   Height as of this encounter: 5\' 9"  (1.753 m).   Weight as of this encounter: 101.8 kg.  Discharge Instructions  Discharge Instructions    Diet - low sodium heart healthy   Complete by: As directed    Discharge wound care:   Complete by: As directed    Increase activity slowly   Complete by: As directed      Allergies as of 11/21/2019   No Known Allergies     Medication List    STOP taking these medications   lisinopril-hydrochlorothiazide 20-25 MG tablet Commonly known as: ZESTORETIC   valACYclovir 1000 MG tablet Commonly known as: VALTREX     TAKE these medications   acyclovir 200 MG capsule Commonly known as: ZOVIRAX Take 2 capsules (400 mg total) by mouth 2 (two) times daily.   ALPRAZolam 0.25 MG tablet Commonly known as: XANAX Take 1 tablet (0.25 mg total) by mouth at bedtime as needed. for anxiety AS NEEDED ONLY!   Anoro Ellipta 62.5-25 MCG/INH Aepb Generic drug: umeclidinium-vilanterol Inhale 1 puff into the lungs daily.   aspirin EC 81 MG tablet Take 81 mg by mouth daily.   atorvastatin 10 MG tablet Commonly known as: LIPITOR Take 1 tablet (10 mg total) by mouth daily.   busPIRone 5 MG tablet Commonly known as: BUSPAR Take 1 tablet (5 mg total) by mouth daily.   clopidogrel 75 MG tablet Commonly  known as: PLAVIX Take 1 tablet (75 mg total) by mouth daily.   dexamethasone 4 MG tablet Commonly known as: DECADRON Take 1 tablet (4 mg total) by mouth 2 (two) times daily. What changed:   medication strength  how much to take  when to take this  additional instructions   FLUoxetine 10 MG capsule Commonly known as: PROZAC Take 1 capsule (10 mg total) by mouth daily.   furosemide 20 MG tablet Commonly known as: LASIX Take 1 tablet (20 mg total) by mouth daily. What changed: when to take this   levETIRAcetam 500 MG tablet Commonly known as: Keppra Take 1 tablet (500 mg total) by mouth 2 (two) times daily.   levothyroxine 112 MCG tablet Commonly known as: SYNTHROID Take 1 tablet (112 mcg total) by mouth daily.   lidocaine 2 % solution Commonly known as: XYLOCAINE Use as directed 15 mLs in the mouth or throat as needed for mouth pain.   lidocaine-prilocaine cream Commonly known as: EMLA Apply 1 application topically as needed.   lisinopril 5 MG tablet Commonly known as: ZESTRIL Take 1 tablet (5 mg total) by mouth daily.   MULTIVITAMIN ADULT PO Take 1 tablet by mouth daily.   mupirocin ointment 2 % Commonly known as: Bactroban Apply 1 application topically 2 (two) times daily. What changed: additional instructions   nystatin cream Commonly known as:  MYCOSTATIN Apply 1 application topically 2 (two) times daily. What changed: additional instructions   Olopatadine HCl 0.2 % Soln Apply 1 drop to eye in the morning and at bedtime.   omeprazole 20 MG capsule Commonly known as: PRILOSEC Take 1 capsule (20 mg total) by mouth daily.   osimertinib mesylate 80 MG tablet Commonly known as: Tagrisso Take 1 tablet (80 mg total) by mouth daily.   oxyCODONE 5 MG immediate release tablet Commonly known as: Oxy IR/ROXICODONE Take 1 tablet (5 mg total) by mouth every 6 (six) hours as needed for severe pain.   sodium hypochlorite 0.125 % Soln Commonly known as:  DAKIN'S 1/4 STRENGTH Irrigate with as directed every morning.   Ventolin HFA 108 (90 Base) MCG/ACT inhaler Generic drug: albuterol Inhale 1-2 puffs into the lungs every 6 (six) hours as needed for wheezing or shortness of breath.            Discharge Care Instructions  (From admission, onward)         Start     Ordered   11/21/19 0000  Discharge wound care:        11/21/19 1018          Contact information for follow-up providers    Juline Patch, MD. Go on 11/28/2019.   Specialty: Family Medicine Why: 3:20pm appointment Contact information: [address] [place] [address] [phone]        Benjamine Sprague, DO In 1 week.   Specialty: Surgery Why: wound check Contact information: [place] [address] [phone]            Contact information for after-discharge care    Destination    [facility].   Service: Skilled Nursing Contact information: [address] [place] [phone]                 No Known Allergies  Consultations:  General surgery neurology PCCM hematology oncology and palliative care   Procedures/Studies: DG Abd 1 View  Result Date: 11/04/2019 CLINICAL DATA:  Ileus. EXAM: ABDOMEN - 1 VIEW COMPARISON:  October 30, 2019. FINDINGS: The bowel gas pattern is normal. Ostomy is noted in left lower quadrant. Surgical drain is noted in the pelvis. IMPRESSION: No evidence of bowel obstruction or ileus. Electronically Signed   By: Marijo Conception M.D.   On: 11/04/2019 13:07   DG Abd 1 View  Result Date: 10/30/2019 CLINICAL DATA:  64 year old female with endotracheal and enteric tube placement. EXAM: PORTABLE CHEST AND ABDOMEN 1 VIEW COMPARISON:  CT abdomen pelvis dated 10/30/2019 and chest CT dated 08/09/2019. FINDINGS: An endotracheal tube is seen with tip approximately 3.5 cm above the carina. Enteric tube extends below the diaphragm with tip  and side-port in the left upper abdomen likely in the body of the stomach. Right-sided Port-A-Cath with tip in the region of the cavoatrial junction. There is mild central vascular prominence. Diffuse bilateral interstitial prominence as well as streaky densities in the left lower lung field, new or progressed since the prior CT. Findings may represent combination of vascular congestion/edema and/or pneumonia. A small left pleural effusion may be present. No pneumothorax. The cardiac silhouette is within normal limits. Atherosclerotic calcification of the aorta. Air is noted within the colon. No bowel dilatation. A drainage catheter noted over the pelvis. Midline anterior pelvic wall cutaneous staples. No acute osseous pathology. IMPRESSION: 1. Endotracheal tube above the carina. Enteric tube with tip and  side-port in the body of the stomach. 2. Probable mild vascular congestion or edema. Pneumonia is not excluded. Clinical correlation is recommended. Electronically Signed   By: Anner Crete M.D.   On: 10/30/2019 23:47   CT HEAD WO CONTRAST  Result Date: 11/06/2019 CLINICAL DATA:  Lung cancer with brain metastasis. Somnolence today. EXAM: CT HEAD WITHOUT CONTRAST TECHNIQUE: Contiguous axial images were obtained from the base of the skull through the vertex without intravenous contrast. COMPARISON:  MRI head with contrast 09/29/2019 FINDINGS: Brain: Extensive vasogenic edema in the right frontal lobe. Enhancing mass lesion is seen in the right frontal lobe on the prior MRI. 9 mm midline shift to the left due to mass-effect is similar to the prior MRI. Negative for acute hemorrhage. Chronic ischemia in the left internal capsule unchanged. Vascular: Negative for hyperdense vessel Skull: No skull lesion identified Sinuses/Orbits: Negative Other: None IMPRESSION: Right frontal metastasis with extensive surrounding vasogenic edema. 9 mm midline shift to the left. Findings similar to the prior MRI. No new acute  findings. Electronically Signed   By: Franchot Gallo M.D.   On: 11/06/2019 11:49   MR BRAIN W WO CONTRAST  Result Date: 11/10/2019 CLINICAL DATA:  Intracranial metastatic disease. EXAM: MRI HEAD WITHOUT AND WITH CONTRAST TECHNIQUE: Multiplanar, multiecho pulse sequences of the brain and surrounding structures were obtained without and with intravenous contrast. CONTRAST:  75mL GADAVIST GADOBUTROL 1 MMOL/ML IV SOLN COMPARISON:  Head CT 11/06/2019 and brain MRI 09/29/2019 FINDINGS: Brain: No acute infarct, acute hemorrhage or extra-axial collection. Contrast-enhancing mass in the right frontal lobe measures 4.2 x 2.6 x 3.7 cm. Previously the lesion measured 3.4 x 2.1 x 3.4 cm. Surrounding vasogenic edema is unchanged. There is leftward midline shift that measures 5 mm, unchanged. Normal volume of CSF spaces. No chronic microhemorrhage. There are no new contrast-enhancing lesions Vascular: Normal flow voids. Skull and upper cervical spine: Normal marrow signal. Sinuses/Orbits: Small amount of right mastoid fluid. Paranasal sinuses are clear. Other: None. IMPRESSION: 1. Increased size of right frontal lobe lesion with unchanged surrounding vasogenic edema and 5 mm leftward midline shift. 2. No new lesion. Electronically Signed   By: Ulyses Jarred M.D.   On: 11/10/2019 23:44   CT ABDOMEN PELVIS W CONTRAST  Result Date: 11/16/2019 CLINICAL DATA:  64 year old female with concern for abdominal infection. EXAM: CT ABDOMEN AND PELVIS WITH CONTRAST TECHNIQUE: Multidetector CT imaging of the abdomen and pelvis was performed using the standard protocol following bolus administration of intravenous contrast. CONTRAST:  162mL OMNIPAQUE IOHEXOL 300 MG/ML  SOLN COMPARISON:  CT abdomen pelvis dated 11/08/2019. FINDINGS: Lower chest: Left lung base linear and streaky atelectasis. There is coronary vascular calcification. Partially visualized tip of a central venous line at the cavoatrial junction. No intra-abdominal free air.  Small free fluid in the pelvis. Hepatobiliary: There is mild irregularity of the liver contour concerning for early changes of cirrhosis. Clinical correlation is recommended. No intrahepatic biliary ductal dilatation. The gallbladder is unremarkable. Pancreas: Unremarkable. No pancreatic ductal dilatation or surrounding inflammatory changes. Spleen: Normal in size without focal abnormality. Adrenals/Urinary Tract: The adrenal glands unremarkable. There is no hydronephrosis on either side. There is symmetric enhancement and excretion of contrast by both kidneys. The visualized ureters and urinary bladder appear unremarkable. Stomach/Bowel: There is postsurgical changes of sigmoid colon resection with a left lower quadrant colostomy. There is no bowel obstruction or active inflammation. There is a 2 cm duodenal diverticulum. The appendix is normal. Vascular/Lymphatic: Moderate aortoiliac atherosclerotic disease. The IVC  is unremarkable. No portal venous gas. There is no adenopathy. Reproductive: The uterus is grossly unremarkable. The right ovary is not visualized with certainty. There is a 2.2 x 1.9 cm hypodense lesion with a focus of fat and calcium in the left ovary most consistent with a teratoma. Further evaluation with ultrasound on a nonemergent/outpatient basis recommended. Other: There is a 4 x 7 cm probably partially loculated fluid within the pelvis, similar or minimally increased since the prior CT. There is a focal area of fat stranding in the left hemipelvis superiorly measuring 3.3 x 2.5 cm (59/2) adjacent to the surgical suture of the blind end of the Hartmann's pouch which appears similar to prior CT, likely postsurgical. A drainage catheter again noted with tip anterior to the uterine fundus. The catheter does not contact the fluid collection in the posterior pelvis. Consider re-evaluation and repositioning. Musculoskeletal: Midline vertical anterior pelvic wall large open wound with packing  material. No acute osseous pathology. IMPRESSION: 1. Postsurgical changes of sigmoid colon resection with a left lower quadrant colostomy. No bowel obstruction. Normal appendix. 2. Persistent fluid collection in the posterior pelvis similar or minimally increased since the prior CT. The drainage catheter does not contact the fluid collection in the posterior pelvis. Consider re-evaluation and repositioning. 3. A 2.2 x 1.9 cm left ovarian teratoma. Further evaluation with ultrasound on a nonemergent/outpatient basis recommended. 4. Aortic Atherosclerosis (ICD10-I70.0). Electronically Signed   By: Anner Crete M.D.   On: 11/16/2019 16:21   CT ABDOMEN PELVIS W CONTRAST  Result Date: 11/08/2019 CLINICAL DATA:  64 year old female with concern for abscess or infection. Status post recent emergent exploratory laparotomy for bowel perforation. EXAM: CT ABDOMEN AND PELVIS WITH CONTRAST TECHNIQUE: Multidetector CT imaging of the abdomen and pelvis was performed using the standard protocol following bolus administration of intravenous contrast. CONTRAST:  117mL OMNIPAQUE IOHEXOL 300 MG/ML  SOLN COMPARISON:  CT abdomen pelvis dated 10/30/2019. FINDINGS: Lower chest: Minimal bibasilar atelectasis. There is a small pocket of extraluminal air in the left upper abdomen, possibly postoperative. No large pneumoperitoneum. Small free fluid noted within the pelvis. Hepatobiliary: Slight irregularity of the liver contour may represent early changes of cirrhosis. Clinical correlation is recommended. No intrahepatic biliary ductal dilatation. The gallbladder is unremarkable. Pancreas: Unremarkable. No pancreatic ductal dilatation or surrounding inflammatory changes. Spleen: Normal in size without focal abnormality. Adrenals/Urinary Tract: The adrenal glands unremarkable. There is no hydronephrosis on either side. There is symmetric enhancement and excretion of contrast by both kidneys. The visualized ureters and urinary bladder  appear unremarkable. Stomach/Bowel: There is postsurgical changes of sigmoid resection with a left lower quadrant colostomy. There is a 2 cm duodenal diverticulum without active inflammatory changes. Oral contrast noted in the small bowel. There is no evidence of bowel obstruction. The appendix is normal. There is a 5.2 x 4.2 cm fluid collection with somewhat surrounding inflammatory changes within the pelvis which may be postoperative. Developing abscess or infection is not excluded. Clinical correlation is recommended. A drainage catheter with tip within the pelvis above the bladder. Vascular/Lymphatic: Advanced aortoiliac atherosclerotic disease. The IVC is unremarkable. No portal venous gas. There is no adenopathy. Reproductive: The uterus and ovaries are grossly unremarkable. Other: Midline vertical anterior pelvic wall surgical incision with open wound and packing material. No drainable fluid collection. There is mild stranding of the subcutaneous soft tissues of the hips. Musculoskeletal: Mild degenerative changes. No acute osseous pathology. IMPRESSION: 1. Postsurgical changes of sigmoid resection with a left lower quadrant colostomy. 2. A  5.2 x 4.2 cm fluid collection with somewhat surrounding inflammatory changes within the pelvis likely postoperative. Developing abscess or infection is not excluded. Clinical correlation is recommended. 3. Small pocket of extraluminal air in the left upper abdomen, possibly postoperative. No large pneumoperitoneum. 4. No bowel obstruction. Normal appendix. 5. Aortic Atherosclerosis (ICD10-I70.0). Electronically Signed   By: Anner Crete M.D.   On: 11/08/2019 16:09   CT ABDOMEN PELVIS W CONTRAST  Result Date: 10/30/2019 CLINICAL DATA:  64 year old female with diffuse abdominal and pelvic pain. History of metastatic lung cancer. EXAM: CT ABDOMEN AND PELVIS WITH CONTRAST TECHNIQUE: Multidetector CT imaging of the abdomen and pelvis was performed using the standard  protocol following bolus administration of intravenous contrast. CONTRAST:  76mL OMNIPAQUE IOHEXOL 300 MG/ML  SOLN COMPARISON:  08/09/2019 FINDINGS: Lower chest: No acute abnormality. Hepatobiliary: The liver and gallbladder are unremarkable. No biliary dilatation. Pancreas: Unremarkable Spleen: Unremarkable Adrenals/Urinary Tract: The kidneys, adrenal glands and bladder are unremarkable. Stomach/Bowel: A small to moderate amount of pneumoperitoneum is present within the abdomen and pelvis, and centered adjacent to the sigmoid colon, which exhibits mild wall thickening. Colonic diverticulosis is noted. No other bowel abnormalities are identified. The appendix is unremarkable. Vascular/Lymphatic: Aortic atherosclerosis. No enlarged abdominal or pelvic lymph nodes. Reproductive: Uterus and bilateral adnexa are unremarkable. Other: A small amount free fluid in the pelvis is noted. No discrete abscess is present. Musculoskeletal: No acute or suspicious bony abnormalities are noted. IMPRESSION: 1. Bowel perforation with small to moderate amount of pneumoperitoneum within the abdomen and pelvis. Pneumoperitoneum is centered adjacent to mildly thickened sigmoid colon, most likely representing perforation of the sigmoid colon, of uncertain etiology. Small amount of free fluid in the pelvis. No discrete abscess. 2. Aortic Atherosclerosis (ICD10-I70.0). Critical Value/emergent results were called by telephone at the time of interpretation on 10/30/2019 at 4:40 pm to provider Laban Emperor, who verbally acknowledged these results. Electronically Signed   By: Margarette Canada M.D.   On: 10/30/2019 16:42   DG Chest Port 1 View  Result Date: 10/30/2019 CLINICAL DATA:  64 year old female with endotracheal and enteric tube placement. EXAM: PORTABLE CHEST AND ABDOMEN 1 VIEW COMPARISON:  CT abdomen pelvis dated 10/30/2019 and chest CT dated 08/09/2019. FINDINGS: An endotracheal tube is seen with tip approximately 3.5 cm above the  carina. Enteric tube extends below the diaphragm with tip and side-port in the left upper abdomen likely in the body of the stomach. Right-sided Port-A-Cath with tip in the region of the cavoatrial junction. There is mild central vascular prominence. Diffuse bilateral interstitial prominence as well as streaky densities in the left lower lung field, new or progressed since the prior CT. Findings may represent combination of vascular congestion/edema and/or pneumonia. A small left pleural effusion may be present. No pneumothorax. The cardiac silhouette is within normal limits. Atherosclerotic calcification of the aorta. Air is noted within the colon. No bowel dilatation. A drainage catheter noted over the pelvis. Midline anterior pelvic wall cutaneous staples. No acute osseous pathology. IMPRESSION: 1. Endotracheal tube above the carina. Enteric tube with tip and side-port in the body of the stomach. 2. Probable mild vascular congestion or edema. Pneumonia is not excluded. Clinical correlation is recommended. Electronically Signed   By: Anner Crete M.D.   On: 10/30/2019 23:47   CT IMAGE GUIDED DRAINAGE BY PERCUTANEOUS CATHETER  Result Date: 11/17/2019 INDICATION: History of perforated diverticulitis, post Hartmann's procedure, currently postoperative day 17 (Dr. Lysle Pearl). CT scan of the abdomen pelvis performed 11/16/2018 for evaluation of leukocytosis  demonstrated an indeterminate fluid collection with the pelvic cul-de-sac. As such, request made for a CT-guided pelvic fluid collection aspiration and/or drainage catheter placement for infection source control purposes. EXAM: CT IMAGE GUIDED DRAINAGE BY PERCUTANEOUS CATHETER COMPARISON:  CT abdomen pelvis-11/16/2019; 11/08/2019 MEDICATIONS: The patient is currently admitted to the hospital and receiving intravenous antibiotics. The antibiotics were administered within an appropriate time frame prior to the initiation of the procedure. ANESTHESIA/SEDATION:  Moderate (conscious) sedation was employed during this procedure. A total of Versed 2 mg and Fentanyl 100 mcg was administered intravenously. Moderate Sedation Time: 10 minutes. The patient's level of consciousness and vital signs were monitored continuously by radiology nursing throughout the procedure under my direct supervision. CONTRAST:  None COMPLICATIONS: None immediate. PROCEDURE: Informed written consent was obtained from the patient after a discussion of the risks, benefits and alternatives to treatment. The patient was placed left lateral decubitus on the CT gantry and a pre procedural CT was performed re-demonstrating the known abscess/fluid collection within the pelvic cul-de-sac with dominant collection measuring approximately 6.7 x 4.8 cm (image 31, series 5). The procedure was planned. A timeout was performed prior to the initiation of the procedure. The skin overlying the right buttocks was prepped and draped in the usual sterile fashion. The overlying soft tissues were anesthetized with 1% lidocaine with epinephrine. Utilizing a right trans gluteal approach, appropriate trajectory was planned with the use of a 22 gauge spinal needle. An 18 gauge trocar needle was advanced into the abscess/fluid collection and a short Amplatz super stiff wire was coiled within the collection. Appropriate positioning was confirmed with a limited CT scan. The tract was serially dilated allowing placement of a 10 [place] all-purpose drainage catheter. Appropriate positioning was confirmed with a limited postprocedural CT scan. Approximately 60 ml of purulent fluid was aspirated. The tube was connected to a drainage bag and sutured in place. A dressing was placed. The patient tolerated the procedure well without immediate post procedural complication. IMPRESSION: Successful CT guided placement of a 10 French all purpose drain catheter into the pelvic abscess via right trans gluteal approach with aspiration of 60 mL of  purulent fluid. Samples were sent to the laboratory as requested by the ordering clinical team. Electronically Signed   By: Sandi Mariscal M.D.   On: 11/17/2019 15:19   (Echo, Carotid, EGD, Colonoscopy, ERCP)    Subjective:  Patient resting in bed no new complaints anxious to go to rehab and start life again Discharge Exam: Vitals:   11/20/19 2015 11/21/19 0501  BP: (!) 137/72 (!) 147/75  Pulse: 85 80  Resp: 20 20  Temp: 98.1 F (36.7 C) (!) 97.5 F (36.4 C)  SpO2: 97% 97%   Vitals:   11/20/19 0352 11/20/19 1203 11/20/19 2015 11/21/19 0501  BP: (!) 136/73 (!) 145/73 (!) 137/72 (!) 147/75  Pulse: 84 92 85 80  Resp: 18 20 20 20   Temp: 97.9 F (36.6 C) 97.8 F (36.6 C) 98.1 F (36.7 C) (!) 97.5 F (36.4 C)  TempSrc:  Oral Oral Oral  SpO2: 96% 96% 97% 97%  Weight:      Height:        General: Pt is alert, awake, not in acute distress Cardiovascular: RRR, S1/S2 +, no rubs, no gallops Respiratory: CTA bilaterally, no wheezing, no rhonchi Abdominal: Soft, NT, ND, bowel sounds + mid abdominal wound covered with dressing drain 1 on the left and one on the right side. Extremities: no edema, no cyanosis    The  results of significant diagnostics from this hospitalization (including imaging, microbiology, ancillary and laboratory) are listed below for reference.     Microbiology: Recent Results (from the past 240 hour(s))  Aerobic/Anaerobic Culture (surgical/deep wound)     Status: Abnormal   Collection Time: 11/17/19  2:40 PM   Specimen: Abscess  Result Value Ref Range Status   Specimen Description   Final    ABSCESS Performed at [facility], [address] [place], [place] [address]    Special Requests   Final    RIGHT TG DRAIN Performed at [facility], [address] [place], [place] [address]    Gram Stain   Final    ABUNDANT WBC PRESENT, PREDOMINANTLY PMN ABUNDANT GRAM POSITIVE COCCI ABUNDANT GRAM NEGATIVE RODS FEW GRAM POSITIVE  RODS Performed at [facility], [place] [address] [place], [place] [address]    Culture (A)  Final    MULTIPLE ORGANISMS PRESENT, NONE PREDOMINANT MIXED ANAEROBIC FLORA PRESENT.  CALL LAB IF FURTHER IID REQUIRED.    Report Status 11/19/2019 FINAL  Final     Labs: BNP (last 3 results) No results for input(s): BNP in the last 8760 hours. Basic Metabolic Panel: Recent Labs  Lab 11/14/19 1304 11/16/19 0439 11/18/19 0530  NA 138 137  --   K 3.5 4.3  --   CL 102 101  --   CO2 26 25  --   GLUCOSE 163* 126*  --   BUN 18 26*  --   CREATININE 0.73 0.60 0.64  CALCIUM 8.2* 8.6*  --   MG  --  2.4  --   PHOS  --  3.6  --    Liver Function Tests: No results for input(s): AST, ALT, ALKPHOS, BILITOT, PROT, ALBUMIN in the last 168 hours. No results for input(s): LIPASE, AMYLASE in the last 168 hours. No results for input(s): AMMONIA in the last 168 hours. CBC: Recent Labs  Lab 11/17/19 0508 11/18/19 0944 11/19/19 0606 11/20/19 0534 11/21/19 0351  WBC 16.7* 18.3* 17.7* 16.2* 15.9*  NEUTROABS  --  16.4* 14.9* 13.8* 13.2*  HGB 9.8* 10.4* 10.7* 9.9* 10.4*  HCT 31.0* 32.6* 33.4* 31.4* 31.3*  MCV 97.5 97.0 97.1 96.9 94.8  PLT 242 234 226 236 223   Cardiac Enzymes: No results for input(s): CKTOTAL, CKMB, CKMBINDEX, TROPONINI in the last 168 hours. BNP: Invalid input(s): POCBNP CBG: Recent Labs  Lab 11/20/19 0741 11/20/19 1206 11/20/19 1528 11/20/19 2123 11/21/19 0733  GLUCAP 132* 143* 162* 162* 110*   D-Dimer No results for input(s): DDIMER in the last 72 hours. Hgb A1c No results for input(s): HGBA1C in the last 72 hours. Lipid Profile No results for input(s): CHOL, HDL, LDLCALC, TRIG, CHOLHDL, LDLDIRECT in the last 72 hours. Thyroid function studies No results for input(s): TSH, T4TOTAL, T3FREE, THYROIDAB in the last 72 hours.  Invalid input(s): FREET3 Anemia work up No results for input(s): VITAMINB12, FOLATE, FERRITIN, TIBC, IRON, RETICCTPCT in the last 72  hours. Urinalysis    Component Value Date/Time   COLORURINE YELLOW 09/14/2019 1132   APPEARANCEUR CLEAR 09/14/2019 1132   APPEARANCEUR Clear 12/19/2014 1559   LABSPEC 1.025 09/14/2019 1132   PHURINE 5.0 09/14/2019 [place] 09/14/2019 1132   HGBUR NEGATIVE 09/14/2019 [place] 09/14/2019 1132   BILIRUBINUR negative 08/03/2019 1133   BILIRUBINUR Negative 12/19/2014 [place] 09/14/2019 1132   PROTEINUR NEGATIVE 09/14/2019 1132   UROBILINOGEN 0.2 08/03/2019 1133  NITRITE NEGATIVE 09/14/2019 [place] 09/14/2019 1132   Sepsis Labs Invalid input(s): PROCALCITONIN,  WBC,  LACTICIDVEN Microbiology Recent Results (from the past 240 hour(s))  Aerobic/Anaerobic Culture (surgical/deep wound)     Status: Abnormal   Collection Time: 11/17/19  2:40 PM   Specimen: Abscess  Result Value Ref Range Status   Specimen Description   Final    ABSCESS Performed at [facility], [address], [place], [place] [address]    Special Requests   Final    RIGHT TG DRAIN Performed at [facility], [address], [place], [place] [address]    Gram Stain   Final    ABUNDANT WBC PRESENT, PREDOMINANTLY PMN ABUNDANT GRAM POSITIVE COCCI ABUNDANT GRAM NEGATIVE RODS FEW GRAM POSITIVE RODS Performed at [facility], [place] [address], [place], [place] [address]    Culture (A)  Final    MULTIPLE ORGANISMS PRESENT, NONE PREDOMINANT MIXED ANAEROBIC FLORA PRESENT.  CALL LAB IF FURTHER IID REQUIRED.    Report Status 11/19/2019 FINAL  Final     Time coordinating discharge:  39 minutes  SIGNED:   Georgette Shell, MD  [facility] 11/21/2019, 10:50 AM Pager   If 7PM-7AM, please contact night-coverage [email or web address] Password [email or web address]

## 2019-11-21 NOTE — Plan of Care (Signed)
Discharge order received. Patient mental status is at baseline. Vital signs stable . No signs of acute distress. Discharge instructions given. Patient verbalized understanding. No other issues noted at this time.   

## 2019-11-21 NOTE — Telephone Encounter (Signed)
Please schedule.  Per Dr. Tasia Catchings [facility] message, patient is being discharged to a SNF today.  Dr. Tasia Catchings would like to see her in 1 week with lab/MD (cb cmp).

## 2019-11-21 NOTE — Telephone Encounter (Signed)
Done.. Lab/MD in 1 week has been sched as requested

## 2019-11-21 NOTE — Progress Notes (Signed)
Pt did not want dressing changed this am due to being discharged today and wanting to wait until the doctor sees her.

## 2019-11-21 NOTE — TOC Transition Note (Signed)
Transition of Care [facility]) - CM/SW Discharge Note   Patient Details  Name: Kristina Wright MRN: [id number] Date of Birth: 10-26-1955  [facility] CM/SW Contact:  Candie Chroman, LCSW Phone Number: 11/21/2019, 1:30 PM   Clinical Narrative: Patient has orders to discharge to [facility] today. RN is giving report now. EMS has been arranged for 2:00. No further concerns. CSW signing off.    Final next level of care: Skilled Nursing Facility Barriers to Discharge: Barriers Resolved   Patient Goals and CMS Choice     Choice offered to / list presented to : Patient, Adult Children, Sibling  Discharge Placement PASRR number recieved: 11/11/19            Patient chooses bed at: [facility] Patient to be transferred to facility by: EMS Name of family member notified: Serita Butcher Patient and family notified of of transfer: 11/21/19  Discharge Plan and Services     Post Acute Care Choice: Home Health                    HH Arranged: RN, PT, OT, Nurse's Aide, Social Work [facility] Agency: [place] ([place]) Date [place]: 11/04/19   Representative spoke with at [place]: [place] ([place]) Interventions     Readmission Risk Interventions Readmission Risk Prevention Plan 11/04/2019  PCP or Specialist Appt within 3-5 Days Complete  HRI or [place] Complete  Social Work Consult for [place] Planning/Counseling Complete  Palliative Care Screening Not Applicable  Some recent data might be hidden

## 2019-11-23 ENCOUNTER — Ambulatory Visit: Payer: BLUE CROSS/BLUE SHIELD | Admitting: Hematology and Oncology

## 2019-11-28 ENCOUNTER — Inpatient Hospital Stay: Payer: 59

## 2019-11-28 ENCOUNTER — Inpatient Hospital Stay: Payer: 59 | Admitting: Oncology

## 2019-11-28 ENCOUNTER — Inpatient Hospital Stay: Payer: 59 | Admitting: Family Medicine

## 2019-11-29 ENCOUNTER — Other Ambulatory Visit: Payer: Self-pay | Admitting: Surgery

## 2019-11-29 DIAGNOSIS — K651 Peritoneal abscess: Secondary | ICD-10-CM

## 2019-12-01 ENCOUNTER — Inpatient Hospital Stay (HOSPITAL_BASED_OUTPATIENT_CLINIC_OR_DEPARTMENT_OTHER): Payer: 59 | Admitting: Oncology

## 2019-12-01 ENCOUNTER — Inpatient Hospital Stay: Payer: 59 | Attending: Oncology

## 2019-12-01 ENCOUNTER — Other Ambulatory Visit: Payer: Self-pay

## 2019-12-01 ENCOUNTER — Encounter: Payer: Self-pay | Admitting: Oncology

## 2019-12-01 VITALS — BP 146/84 | HR 109 | Temp 98.1°F | Resp 18

## 2019-12-01 DIAGNOSIS — C7931 Secondary malignant neoplasm of brain: Secondary | ICD-10-CM | POA: Insufficient documentation

## 2019-12-01 DIAGNOSIS — Z7189 Other specified counseling: Secondary | ICD-10-CM

## 2019-12-01 DIAGNOSIS — C3490 Malignant neoplasm of unspecified part of unspecified bronchus or lung: Secondary | ICD-10-CM | POA: Insufficient documentation

## 2019-12-01 DIAGNOSIS — D649 Anemia, unspecified: Secondary | ICD-10-CM | POA: Diagnosis not present

## 2019-12-01 DIAGNOSIS — Z7952 Long term (current) use of systemic steroids: Secondary | ICD-10-CM | POA: Diagnosis not present

## 2019-12-01 DIAGNOSIS — L089 Local infection of the skin and subcutaneous tissue, unspecified: Secondary | ICD-10-CM

## 2019-12-01 DIAGNOSIS — S31109S Unspecified open wound of abdominal wall, unspecified quadrant without penetration into peritoneal cavity, sequela: Secondary | ICD-10-CM

## 2019-12-01 LAB — CBC WITH DIFFERENTIAL/PLATELET
Abs Immature Granulocytes: 0.2 10*3/uL — ABNORMAL HIGH (ref 0.00–0.07)
Basophils Absolute: 0.1 10*3/uL (ref 0.0–0.1)
Basophils Relative: 1 %
Eosinophils Absolute: 0 10*3/uL (ref 0.0–0.5)
Eosinophils Relative: 0 %
HCT: 31.6 % — ABNORMAL LOW (ref 36.0–46.0)
Hemoglobin: 10.3 g/dL — ABNORMAL LOW (ref 12.0–15.0)
Immature Granulocytes: 2 %
Lymphocytes Relative: 16 %
Lymphs Abs: 1.5 10*3/uL (ref 0.7–4.0)
MCH: 30.6 pg (ref 26.0–34.0)
MCHC: 32.6 g/dL (ref 30.0–36.0)
MCV: 93.8 fL (ref 80.0–100.0)
Monocytes Absolute: 0.3 10*3/uL (ref 0.1–1.0)
Monocytes Relative: 3 %
Neutro Abs: 7.3 10*3/uL (ref 1.7–7.7)
Neutrophils Relative %: 78 %
Platelets: 282 10*3/uL (ref 150–400)
RBC: 3.37 MIL/uL — ABNORMAL LOW (ref 3.87–5.11)
RDW: 16.5 % — ABNORMAL HIGH (ref 11.5–15.5)
Smear Review: NORMAL
WBC Morphology: INCREASED
WBC: 9.4 10*3/uL (ref 4.0–10.5)
nRBC: 0.5 % — ABNORMAL HIGH (ref 0.0–0.2)

## 2019-12-01 LAB — COMPREHENSIVE METABOLIC PANEL
ALT: 45 U/L — ABNORMAL HIGH (ref 0–44)
AST: 25 U/L (ref 15–41)
Albumin: 2.3 g/dL — ABNORMAL LOW (ref 3.5–5.0)
Alkaline Phosphatase: 81 U/L (ref 38–126)
Anion gap: 11 (ref 5–15)
BUN: 15 mg/dL (ref 8–23)
CO2: 24 mmol/L (ref 22–32)
Calcium: 8.1 mg/dL — ABNORMAL LOW (ref 8.9–10.3)
Chloride: 99 mmol/L (ref 98–111)
Creatinine, Ser: 0.42 mg/dL — ABNORMAL LOW (ref 0.44–1.00)
GFR calc Af Amer: >60 mL/min (ref >60)
GFR calc non Af Amer: >60 mL/min (ref >60)
Glucose, Bld: 112 mg/dL — ABNORMAL HIGH (ref 70–99)
Potassium: 3.6 mmol/L (ref 3.5–5.1)
Sodium: 134 mmol/L — ABNORMAL LOW (ref 135–145)
Total Bilirubin: 0.4 mg/dL (ref 0.3–1.2)
Total Protein: 6.3 g/dL — ABNORMAL LOW (ref 6.5–8.1)

## 2019-12-01 MED ORDER — DEXAMETHASONE 1 MG PO TABS
3.0000 mg | ORAL_TABLET | Freq: Two times a day (BID) | ORAL | 0 refills | Status: DC
Start: 2019-12-01 — End: 2019-12-18

## 2019-12-01 NOTE — Progress Notes (Signed)
Patient here for hospital f/u and is transported via EMS from [facility].

## 2019-12-01 NOTE — Progress Notes (Signed)
Hematology/Oncology follow up note [facility] Telephone:([phone] [phone]) [phone]   Patient Care Team: Juline Patch, MD as PCP - General (Family Medicine) Lequita Asal, MD as Referring Physician (Hematology and Oncology) Schnier, Dolores Lory, MD (Vascular Surgery) Noreene Filbert, MD as Radiation Oncologist (Radiation Oncology)  REFERRING PROVIDER: Juline Patch, MD REASON FOR VISIT:  Follow-up on lung cancer management.  HISTORY OF PRESENTING ILLNESS:  Kristina Wright is a  64 y.o.  female with PMH listed below who presents to re-establish care with me for management of Stage IV lung adenocarcinoma.  She has a history of 30-year pack smoking history, former smoker.  Significant family history of cancer.  Her sister follows up with me for breast cancer.  Other family member cancer history listed in the family history section.  Self-report history of cervical cancer in 1989.  INTERVAL HISTORY Kristina Wright is a 64 y.o. female who has above history reviewed by me today present for reestablish care. Patient was last seen by me on 04/08/2018.  At that time she transferred her care to my colleague Dr. Mike Gip.  Patient recently called and prefers to transfer her care back to me.  Extensive medical records review was performed by me.  I also discussed with Dr. Mike Gip via Secure chat. #02/24/2018, right axillary lymph node biopsy showed adenocarcinoma  compatible with lung primary.  Stage IV lung adenocarcinoma  initially cT1N3 M1 + EGFR [id number]. Mutation, PDL-1 60% started osimertinib 03/25/2018.  03/10/2018 brain MRI showed no brain metastasis  #06/17/2018 CT chest showed marked interval reduction of mediastinal and right hilar lymphadenopathy.  Slight increase of left lower lobe pulmonary nodule-the nodule was not hypermetabolic on previous PET scan. #CT 08/23/2018 revealed a stable 10 mm left lower lobe no nodule.  Tree-in-bud nodularity seen in the  medial right upper lobe on the previous study likely improved.  Stable 14 mm left breast nodule. #11/24/2018, chest CT reviewed enlarging left lower lobe pulmonary nodule, 1.5 x 1.4 cm concerning for progressive malignancy. Tissue diagnosis was recommended and patient was referred to see pulmonology at that point.   PET 12/29/2018 showed left lower lobe superior segment nodule has SUV of 8, highly suspicious for carcinoma.  Remainder of the adenocarcinoma appears to be responding well.    Patient declined biopsy.  Patient was referred to establish care with Dr. Baruch Gouty ball 01/31/2019 for consideration of SBRT.  Patient received SBRT to left lower lobe nodule 02/03/2019-02/28/2019, Patient was continued on osimertinib. 04/20/2019 CT chest showed interval reduction of the left lower lobe nodule following stereotactic body radiation treatments.  No new or progressive malignancy. 07/20/2019 CT chest reviewed continued evolutionary changes secondary to radiation involving the lingula and superior segment of the left lower lobe.  No specific findings to suggest residual or recurrent tumor. Patient has headache.  08/03/2019, patient presented to emergency room after developing partial seizure episodes CT head concerning for hyperdense frontal lesion concerning for metastasis. 08/03/2019, MRI brain with and without contrast showed right frontal mass likely reflecting metastasis.  Significant surrounding edema with mild regional mass-effect including subcentimeter leftward midline shift. Patient was seen by neurosurgery while she was in the ER.  Patient was started on Decadron and Keppra. Surgery versus stereotactic radio surgery was discussed.  Patient was advised not to drive. 08/09/2019, restaging CT scan showed chest CT stable since 07/20/2019 without new or progressive findings.  12 mm short axis left hilar node is probably stable since prior study although difficult  access given lack of intravenous contrast on  the earlier exams.  Close attention on follow-up.  No findings to suggest metastatic disease in the abdomen/pelvis.  2.5 cm cystic lesion left ovary.  Not hypermetabolic on previous PET scan  #08/23/2019 MRI brain with and without contrast showed a solitary enhancing mass at the right frontal lobe now measuring 2.8 cm.  Inseparable from overlying dura which appears mildly thickened.  Favoring intra-axial rather than extra-axial metastasis.  Mild regression of vasogenic edema and mass-effect since April 2021 study.  #Patient underwent stereotactic radiation surgery on 09/12/2019. Currently patient has been tapered off dexamethasone.  She continues on Keppra.  Patient has also developed shingles and thrush which has been treated symptomatically.   She was recently seen by nurse practitioner [name].  She also has called reporting concerns of lower extremity swelling and skin concerns. Patient complained about bites on her feet and legs.  Patient was seen by primary care provider Dr. Ronnald Ramp and was given a prescription of nystatin cream for her legs.   Stat MRI of the brain was done on 09/29/2019 which showed interval increasing enhancement at the site of solitary right frontal operculum metastasis now encompassing region of 3.4 cm.  As before the mass is inseparable from the overlying dura, favoring intraaxial.  Surrounding edema is also notably increased. Patient prefers to switch her care back to me for further evaluation.   INTERVAL HISTORY Kristina Wright is a 64 y.o. female who has above history reviewed by me today presents for follow up visit for management of lung cancer, and cancer related symptoms. Problems and complaints are listed below: 10/30/2019-8/2/ 2021 admitted due to bowel perforation secondary to diverticulitis, status post exploratory laparotomy with lysis of adhesions, abdominal washout, sigmoid resection with colostomy, she developed vasogenic edema during the admission due to  being off of steroids.  And mental status responded to resuming of the steroid.  Hospitalization also was complicated by abdominal wound infection which was managed by IV antibiotics and wound care.   Patient currently living nursing facility.   Abdominal wound, patient follows up with Dr. Lysle Pearl. She reports feeling well.  Denies any confusion, seizure activity.  She is not able to walk.  Currently on dexamethasone 4 mg twice daily.  Review of Systems  Constitutional: Positive for malaise/fatigue. Negative for chills, fever and weight loss.  HENT: Negative for nosebleeds and sore throat.   Eyes: Negative for double vision, photophobia and redness.  Respiratory: Negative for cough, shortness of breath and wheezing.   Cardiovascular: Positive for leg swelling. Negative for chest pain, palpitations and orthopnea.  Gastrointestinal: Negative for abdominal pain, blood in stool, nausea and vomiting.  Genitourinary: Negative for dysuria.  Musculoskeletal: Negative for back pain, myalgias and neck pain.  Skin: Negative for itching and rash.  Neurological: Negative for headaches.  Endo/Heme/Allergies: Negative for environmental allergies. Does not bruise/bleed easily.  Psychiatric/Behavioral: Negative for depression and hallucinations. The patient is not nervous/anxious and does not have insomnia.     MEDICAL HISTORY:  Past Medical History:  Diagnosis Date  . Cancer ([place]) 1989   cervical  . Depression   . Family history of breast cancer   . Family history of colon cancer   . Family history of ovarian cancer   . Family history of stomach cancer   . Family history of throat cancer   . Hyperlipidemia   . Hypertension   . Malignant neoplasm of lung ([place]) 03/01/2018  . Thyroid disease  SURGICAL HISTORY: Past Surgical History:  Procedure Laterality Date  . BILATERAL CARPAL TUNNEL RELEASE    . BOWEL RESECTION N/A 10/30/2019   Procedure: SMALL BOWEL RESECTION;  Surgeon: Benjamine Sprague, DO;   Location: [facility] OR[facility];  Service: General;  Laterality: N/A;  . BREAST CYST ASPIRATION Left   . LAPAROTOMY N/A 10/30/2019   Procedure: EXPLORATORY LAPAROTOMY;  Surgeon: Benjamine Sprague, DO;  Location: [facility];  Service: General;  Laterality: N/A;  . LUNG BIOPSY    . PARTIAL HYSTERECTOMY    . PORTA CATH INSERTION N/A 03/03/2018   Procedure: PORTA CATH INSERTION;  Surgeon: Katha Cabal, MD;  Location: [facility];  Service: Cardiovascular;  Laterality: N/A;  . TUBAL LIGATION      SOCIAL HISTORY: Social History   Socioeconomic History  . Marital status: Single    Spouse name: Not on file  . Number of children: 3  . Years of education: Not on file  . Highest education level: Not on file  Occupational History  . Not on file  Tobacco Use  . Smoking status: Former Smoker    Packs/day: 1.00    Years: 25.00    Pack years: 25.00    Types: Cigarettes    Quit date: 11/19/2017    Years since quitting: 2.0  . Smokeless tobacco: Never Used  Vaping Use  . Vaping Use: Never used  Substance and Sexual Activity  . Alcohol use: No  . Drug use: Yes    Types: Marijuana    Comment: occasional  . Sexual activity: Not on file  Other Topics Concern  . Not on file  Social History Narrative  . Not on file   Social Determinants of Health   Financial Resource Strain:   . Difficulty of Paying Living Expenses:   Food Insecurity:   . Worried About Charity fundraiser in the Last Year:   . Arboriculturist in the Last Year:   Transportation Needs:   . Film/video editor (Medical):   Marland Kitchen Lack of Transportation (Non-Medical):   Physical Activity:   . Days of Exercise per Week:   . Minutes of Exercise per Session:   Stress:   . Feeling of Stress :   Social Connections:   . Frequency of Communication with Friends and Family:   . Frequency of Social Gatherings with Friends and Family:   . Attends Religious Services:   . Active Member of Clubs or Organizations:   . Attends Theatre manager Meetings:   Marland Kitchen Marital Status:   Intimate Partner Violence:   . Fear of Current or Ex-Partner:   . Emotionally Abused:   Marland Kitchen Physically Abused:   . Sexually Abused:     FAMILY HISTORY: Family History  Problem Relation Age of Onset  . Colon cancer Mother 27  . Colon cancer Maternal Grandmother 78  . Hypertension Father   . Breast cancer Sister 46  . Throat cancer Brother   . Heart attack Maternal Aunt   . Ovarian cancer Maternal Aunt 70  . Stomach cancer Paternal Grandmother        dx >50  . Throat cancer Brother     ALLERGIES:  has No Known Allergies.  MEDICATIONS:  Current Outpatient Medications  Medication Sig Dispense Refill  . acyclovir (ZOVIRAX) 200 MG capsule Take 2 capsules (400 mg total) by mouth 2 (two) times daily. 60 capsule 0  . ALPRAZolam (XANAX) 0.25 MG tablet Take 1 tablet (0.25 mg total) by  mouth at bedtime as needed. for anxiety AS NEEDED ONLY! 30 tablet 0  . aspirin EC 81 MG tablet Take 81 mg by mouth daily.    Marland Kitchen atorvastatin (LIPITOR) 10 MG tablet Take 1 tablet (10 mg total) by mouth daily. 90 tablet 1  . busPIRone (BUSPAR) 5 MG tablet Take 1 tablet (5 mg total) by mouth daily. 90 tablet 1  . clopidogrel (PLAVIX) 75 MG tablet Take 1 tablet (75 mg total) by mouth daily. 90 tablet 0  . FLUoxetine (PROZAC) 10 MG capsule Take 1 capsule (10 mg total) by mouth daily. 90 capsule 1  . furosemide (LASIX) 20 MG tablet Take 1 tablet (20 mg total) by mouth daily. 60 tablet 3  . HYDROcodone-acetaminophen (NORCO/VICODIN) 5-325 MG tablet Take 2 tablets by mouth every 6 (six) hours as needed for moderate pain.    Marland Kitchen levETIRAcetam (KEPPRA) 500 MG tablet Take 1 tablet (500 mg total) by mouth 2 (two) times daily. 60 tablet 2  . levothyroxine (SYNTHROID) 112 MCG tablet Take 1 tablet (112 mcg total) by mouth daily. 90 tablet 1  . lidocaine (XYLOCAINE) 2 % solution Use as directed 15 mLs in the mouth or throat as needed for mouth pain. 100 mL 0  . lidocaine-prilocaine  (EMLA) cream Apply 1 application topically as needed.    Marland Kitchen lisinopril (ZESTRIL) 5 MG tablet Take 1 tablet (5 mg total) by mouth daily. 30 tablet 11  . Multiple Vitamins-Minerals (MULTIVITAMIN ADULT PO) Take 1 tablet by mouth daily.     . Olopatadine HCl 0.2 % SOLN Apply 1 drop to eye in the morning and at bedtime. 2.5 mL 1  . omeprazole (PRILOSEC) 20 MG capsule Take 1 capsule (20 mg total) by mouth daily. 30 capsule 2  . osimertinib mesylate (TAGRISSO) 80 MG tablet Take 1 tablet (80 mg total) by mouth daily. 30 tablet 2  . umeclidinium-vilanterol (ANORO ELLIPTA) 62.5-25 MCG/INH AEPB Inhale 1 puff into the lungs daily. 60 each 5  . VENTOLIN HFA 108 (90 Base) MCG/ACT inhaler Inhale 1-2 puffs into the lungs every 6 (six) hours as needed for wheezing or shortness of breath. 18 g 1  . dexamethasone (DECADRON) 1 MG tablet Take 3 tablets (3 mg total) by mouth 2 (two) times daily with a meal. 84 tablet 0  . mupirocin ointment (BACTROBAN) 2 % Apply 1 application topically 2 (two) times daily. (Patient not taking: Reported on 12/01/2019) 22 g 0  . nystatin cream (MYCOSTATIN) Apply 1 application topically 2 (two) times daily. (Patient not taking: Reported on 12/01/2019) 30 g 0  . oxyCODONE (OXY IR/ROXICODONE) 5 MG immediate release tablet Take 1 tablet (5 mg total) by mouth every 6 (six) hours as needed for severe pain. (Patient not taking: Reported on 12/01/2019) 30 tablet 0  . sodium hypochlorite (DAKIN'S 1/4 STRENGTH) 0.125 % SOLN Irrigate with as directed every morning. (Patient not taking: Reported on 12/01/2019) 473 mL 0   No current facility-administered medications for this visit.     PHYSICAL EXAMINATION: ECOG PERFORMANCE STATUS: 1 - Symptomatic but completely ambulatory Vitals:   12/01/19 1029  BP: (!) 146/84  Pulse: (!) 109  Resp: 18  Temp: 98.1 F (36.7 C)   There were no vitals filed for this visit.  Physical Exam Constitutional:      General: She is not in acute distress.     Appearance: She is ill-appearing.  HENT:     Head: Normocephalic and atraumatic.  Eyes:     General: No scleral  icterus.    Pupils: Pupils are equal, round, and reactive to light.  Cardiovascular:     Rate and Rhythm: Normal rate and regular rhythm.     Heart sounds: Normal heart sounds.  Pulmonary:     Effort: Pulmonary effort is normal. No respiratory distress.     Breath sounds: No wheezing.     Comments: Decreased breath sounds bilaterally Abdominal:     General: Bowel sounds are normal. There is no distension.     Palpations: Abdomen is soft.     Comments: + Colostomy bag  Abdomen open wound packed with gauze and dressing. + JP drain  Musculoskeletal:        General: Swelling present. No deformity. Normal range of motion.     Cervical back: Normal range of motion and neck supple.     Comments: Bilateral lower extremity edema, unna boots  Skin:    Findings: No rash.  Neurological:     Mental Status: She is alert and oriented to person, place, and time. Mental status is at baseline.     Cranial Nerves: No cranial nerve deficit.     Coordination: Coordination normal.  Psychiatric:        Mood and Affect: Mood normal.      LABORATORY DATA:  I have reviewed the data as listed Lab Results  Component Value Date   WBC 9.4 12/01/2019   HGB 10.3 (L) 12/01/2019   HCT 31.6 (L) 12/01/2019   MCV 93.8 12/01/2019   PLT 282 12/01/2019   Recent Labs    10/20/19 1057 10/20/19 1057 10/30/19 1508 10/31/19 0516 11/14/19 1304 11/14/19 1304 11/16/19 0439 11/18/19 0530 12/01/19 1040  NA 129*   < > 126*   < > 138  --  137  --  134*  K 4.0   < > 4.5   < > 3.5  --  4.3  --  3.6  CL 94*   < > 93*   < > 102  --  101  --  99  CO2 23   < > 22   < > 26  --  25  --  24  GLUCOSE 208*   < > 172*   < > 163*  --  126*  --  112*  BUN 35*   < > 43*   < > 18  --  26*  --  15  CREATININE 1.09*   < > 1.38*   < > 0.73   < > 0.60 0.64 0.42*  CALCIUM 8.6*   < > 8.3*   < > 8.2*  --  8.6*  --   8.1*  GFRNONAA 55*   < > 41*   < > >60   < > >60 >60 >60  GFRAA >60   < > 48*   < > >60   < > >60 >60 >60  PROT 6.9  --  6.7  --   --   --   --   --  6.3*  ALBUMIN 3.3*  --  2.2*  --   --   --   --   --  2.3*  AST 17  --  26  --   --   --   --   --  25  ALT 29  --  43  --   --   --   --   --  45*  ALKPHOS 70  --  100  --   --   --   --   --  81  BILITOT 0.6  --  0.9  --   --   --   --   --  0.4   < > = values in this interval not displayed.   Iron/TIBC/Ferritin/ %Sat    Component Value Date/Time   IRON 24 (L) 11/03/2019 0644   TIBC 130 (L) 11/03/2019 0644   FERRITIN 1,383 (H) 11/03/2019 0644   IRONPCTSAT 18 11/03/2019 0644     RADIOGRAPHIC STUDIES: I have personally reviewed the radiological images as listed and agreed with the findings in the report. DG Abd 1 View  Result Date: 11/04/2019 CLINICAL DATA:  Ileus. EXAM: ABDOMEN - 1 VIEW COMPARISON:  October 30, 2019. FINDINGS: The bowel gas pattern is normal. Ostomy is noted in left lower quadrant. Surgical drain is noted in the pelvis. IMPRESSION: No evidence of bowel obstruction or ileus. Electronically Signed   By: Marijo Conception M.D.   On: 11/04/2019 13:07   DG Abd 1 View  Result Date: 10/30/2019 CLINICAL DATA:  64 year old female with endotracheal and enteric tube placement. EXAM: PORTABLE CHEST AND ABDOMEN 1 VIEW COMPARISON:  CT abdomen pelvis dated 10/30/2019 and chest CT dated 08/09/2019. FINDINGS: An endotracheal tube is seen with tip approximately 3.5 cm above the carina. Enteric tube extends below the diaphragm with tip and side-port in the left upper abdomen likely in the body of the stomach. Right-sided Port-A-Cath with tip in the region of the cavoatrial junction. There is mild central vascular prominence. Diffuse bilateral interstitial prominence as well as streaky densities in the left lower lung field, new or progressed since the prior CT. Findings may represent combination of vascular congestion/edema and/or pneumonia. A  small left pleural effusion may be present. No pneumothorax. The cardiac silhouette is within normal limits. Atherosclerotic calcification of the aorta. Air is noted within the colon. No bowel dilatation. A drainage catheter noted over the pelvis. Midline anterior pelvic wall cutaneous staples. No acute osseous pathology. IMPRESSION: 1. Endotracheal tube above the carina. Enteric tube with tip and side-port in the body of the stomach. 2. Probable mild vascular congestion or edema. Pneumonia is not excluded. Clinical correlation is recommended. Electronically Signed   By: Anner Crete M.D.   On: 10/30/2019 23:47   CT HEAD WO CONTRAST  Result Date: 11/06/2019 CLINICAL DATA:  Lung cancer with brain metastasis. Somnolence today. EXAM: CT HEAD WITHOUT CONTRAST TECHNIQUE: Contiguous axial images were obtained from the base of the skull through the vertex without intravenous contrast. COMPARISON:  MRI head with contrast 09/29/2019 FINDINGS: Brain: Extensive vasogenic edema in the right frontal lobe. Enhancing mass lesion is seen in the right frontal lobe on the prior MRI. 9 mm midline shift to the left due to mass-effect is similar to the prior MRI. Negative for acute hemorrhage. Chronic ischemia in the left internal capsule unchanged. Vascular: Negative for hyperdense vessel Skull: No skull lesion identified Sinuses/Orbits: Negative Other: None IMPRESSION: Right frontal metastasis with extensive surrounding vasogenic edema. 9 mm midline shift to the left. Findings similar to the prior MRI. No new acute findings. Electronically Signed   By: Franchot Gallo M.D.   On: 11/06/2019 11:49   MR BRAIN W WO CONTRAST  Result Date: 11/10/2019 CLINICAL DATA:  Intracranial metastatic disease. EXAM: MRI HEAD WITHOUT AND WITH CONTRAST TECHNIQUE: Multiplanar, multiecho pulse sequences of the brain and surrounding structures were obtained without and with intravenous contrast. CONTRAST:  22m GADAVIST GADOBUTROL 1 MMOL/ML IV  SOLN COMPARISON:  Head CT 11/06/2019 and brain MRI 09/29/2019 FINDINGS:  Brain: No acute infarct, acute hemorrhage or extra-axial collection. Contrast-enhancing mass in the right frontal lobe measures 4.2 x 2.6 x 3.7 cm. Previously the lesion measured 3.4 x 2.1 x 3.4 cm. Surrounding vasogenic edema is unchanged. There is leftward midline shift that measures 5 mm, unchanged. Normal volume of CSF spaces. No chronic microhemorrhage. There are no new contrast-enhancing lesions Vascular: Normal flow voids. Skull and upper cervical spine: Normal marrow signal. Sinuses/Orbits: Small amount of right mastoid fluid. Paranasal sinuses are clear. Other: None. IMPRESSION: 1. Increased size of right frontal lobe lesion with unchanged surrounding vasogenic edema and 5 mm leftward midline shift. 2. No new lesion. Electronically Signed   By: Ulyses Jarred M.D.   On: 11/10/2019 23:44   MR Brain W Wo Contrast  Result Date: 09/29/2019 CLINICAL DATA:  Brain metastasis. Intractable headache, unspecified chronicity pattern, unspecified headache type. Additional history provided: Headache/possible hallucinations/history of brain mets. Status post brain radiation, patient reports severe headache for several days. EXAM: MRI HEAD WITHOUT AND WITH CONTRAST TECHNIQUE: Multiplanar, multiecho pulse sequences of the brain and surrounding structures were obtained without and with intravenous contrast. CONTRAST:  43m GADAVIST GADOBUTROL 1 MMOL/ML IV SOLN COMPARISON:  Prior brain MRI examinations 08/31/2019 and earlier FINDINGS: Brain: There has been an interval increase in enhancement at site of a solitary metastasis centered within the right frontal operculum, now encompassing a region of 3.4 cm (previously 2.8 cm). As before, portions of the mass are inseparable from the overlying dura and there is adjacent mild dural thickening and enhancement. Surrounding edema has notably increased. There is increased mass effect with increased partial  effacement of the right lateral ventricle and now 6 mm leftward midline shift measured at the level of the septum pellucidum. Redemonstrated small foci of SWI signal loss along the anterolateral aspect of the mass, which may reflect small foci of non-acute hemorrhage. No new intracranial enhancing metastasis is demonstrated. Stable nonspecific symmetric enhancement within the IAC fundus bilaterally. Additional patchy T2/FLAIR hyperintensity within the cerebral white matter is nonspecific, but consistent with chronic small vessel ischemic disease. There is no acute infarct. No extra-axial fluid collection. Vascular: Expected proximal arterial flow voids. Skull and upper cervical spine: No focal marrow lesion. Sinuses/Orbits: Visualized orbits show no acute finding. Mild ethmoid sinus mucosal thickening. No significant mastoid effusion. IMPRESSION: Interval increase in enhancement at site of a solitary right frontal operculum metastasis, now encompassing a region of 3.4 cm (previously 2.8 cm). As before, the mass is inseparable from the overlying dura, although the lesion is favored intra-axial. Surrounding edema has also notably increased. This increased enhancement and edema may reflect inflammatory changes from recent radiation therapy. Attention recommended on follow-up. Increased mass effect with increased partial effacement of the right lateral ventricle and now 6 mm leftward midline shift. No new intracranial metastases are identified. Electronically Signed   By: KKellie SimmeringDO   On: 09/29/2019 13:56   CT ABDOMEN PELVIS W CONTRAST  Result Date: 11/16/2019 CLINICAL DATA:  64year old female with concern for abdominal infection. EXAM: CT ABDOMEN AND PELVIS WITH CONTRAST TECHNIQUE: Multidetector CT imaging of the abdomen and pelvis was performed using the standard protocol following bolus administration of intravenous contrast. CONTRAST:  1063mOMNIPAQUE IOHEXOL 300 MG/ML  SOLN COMPARISON:  CT abdomen pelvis  dated 11/08/2019. FINDINGS: Lower chest: Left lung base linear and streaky atelectasis. There is coronary vascular calcification. Partially visualized tip of a central venous line at the cavoatrial junction. No intra-abdominal free air. Small free fluid in the  pelvis. Hepatobiliary: There is mild irregularity of the liver contour concerning for early changes of cirrhosis. Clinical correlation is recommended. No intrahepatic biliary ductal dilatation. The gallbladder is unremarkable. Pancreas: Unremarkable. No pancreatic ductal dilatation or surrounding inflammatory changes. Spleen: Normal in size without focal abnormality. Adrenals/Urinary Tract: The adrenal glands unremarkable. There is no hydronephrosis on either side. There is symmetric enhancement and excretion of contrast by both kidneys. The visualized ureters and urinary bladder appear unremarkable. Stomach/Bowel: There is postsurgical changes of sigmoid colon resection with a left lower quadrant colostomy. There is no bowel obstruction or active inflammation. There is a 2 cm duodenal diverticulum. The appendix is normal. Vascular/Lymphatic: Moderate aortoiliac atherosclerotic disease. The IVC is unremarkable. No portal venous gas. There is no adenopathy. Reproductive: The uterus is grossly unremarkable. The right ovary is not visualized with certainty. There is a 2.2 x 1.9 cm hypodense lesion with a focus of fat and calcium in the left ovary most consistent with a teratoma. Further evaluation with ultrasound on a nonemergent/outpatient basis recommended. Other: There is a 4 x 7 cm probably partially loculated fluid within the pelvis, similar or minimally increased since the prior CT. There is a focal area of fat stranding in the left hemipelvis superiorly measuring 3.3 x 2.5 cm (59/2) adjacent to the surgical suture of the blind end of the Hartmann's pouch which appears similar to prior CT, likely postsurgical. A drainage catheter again noted with tip  anterior to the uterine fundus. The catheter does not contact the fluid collection in the posterior pelvis. Consider re-evaluation and repositioning. Musculoskeletal: Midline vertical anterior pelvic wall large open wound with packing material. No acute osseous pathology. IMPRESSION: 1. Postsurgical changes of sigmoid colon resection with a left lower quadrant colostomy. No bowel obstruction. Normal appendix. 2. Persistent fluid collection in the posterior pelvis similar or minimally increased since the prior CT. The drainage catheter does not contact the fluid collection in the posterior pelvis. Consider re-evaluation and repositioning. 3. A 2.2 x 1.9 cm left ovarian teratoma. Further evaluation with ultrasound on a nonemergent/outpatient basis recommended. 4. Aortic Atherosclerosis (ICD10-I70.0). Electronically Signed   By: Anner Crete M.D.   On: 11/16/2019 16:21   CT ABDOMEN PELVIS W CONTRAST  Result Date: 11/08/2019 CLINICAL DATA:  64 year old female with concern for abscess or infection. Status post recent emergent exploratory laparotomy for bowel perforation. EXAM: CT ABDOMEN AND PELVIS WITH CONTRAST TECHNIQUE: Multidetector CT imaging of the abdomen and pelvis was performed using the standard protocol following bolus administration of intravenous contrast. CONTRAST:  135m OMNIPAQUE IOHEXOL 300 MG/ML  SOLN COMPARISON:  CT abdomen pelvis dated 10/30/2019. FINDINGS: Lower chest: Minimal bibasilar atelectasis. There is a small pocket of extraluminal air in the left upper abdomen, possibly postoperative. No large pneumoperitoneum. Small free fluid noted within the pelvis. Hepatobiliary: Slight irregularity of the liver contour may represent early changes of cirrhosis. Clinical correlation is recommended. No intrahepatic biliary ductal dilatation. The gallbladder is unremarkable. Pancreas: Unremarkable. No pancreatic ductal dilatation or surrounding inflammatory changes. Spleen: Normal in size without  focal abnormality. Adrenals/Urinary Tract: The adrenal glands unremarkable. There is no hydronephrosis on either side. There is symmetric enhancement and excretion of contrast by both kidneys. The visualized ureters and urinary bladder appear unremarkable. Stomach/Bowel: There is postsurgical changes of sigmoid resection with a left lower quadrant colostomy. There is a 2 cm duodenal diverticulum without active inflammatory changes. Oral contrast noted in the small bowel. There is no evidence of bowel obstruction. The appendix is normal. There is  a 5.2 x 4.2 cm fluid collection with somewhat surrounding inflammatory changes within the pelvis which may be postoperative. Developing abscess or infection is not excluded. Clinical correlation is recommended. A drainage catheter with tip within the pelvis above the bladder. Vascular/Lymphatic: Advanced aortoiliac atherosclerotic disease. The IVC is unremarkable. No portal venous gas. There is no adenopathy. Reproductive: The uterus and ovaries are grossly unremarkable. Other: Midline vertical anterior pelvic wall surgical incision with open wound and packing material. No drainable fluid collection. There is mild stranding of the subcutaneous soft tissues of the hips. Musculoskeletal: Mild degenerative changes. No acute osseous pathology. IMPRESSION: 1. Postsurgical changes of sigmoid resection with a left lower quadrant colostomy. 2. A 5.2 x 4.2 cm fluid collection with somewhat surrounding inflammatory changes within the pelvis likely postoperative. Developing abscess or infection is not excluded. Clinical correlation is recommended. 3. Small pocket of extraluminal air in the left upper abdomen, possibly postoperative. No large pneumoperitoneum. 4. No bowel obstruction. Normal appendix. 5. Aortic Atherosclerosis (ICD10-I70.0). Electronically Signed   By: Anner Crete M.D.   On: 11/08/2019 16:09   CT ABDOMEN PELVIS W CONTRAST  Result Date: 10/30/2019 CLINICAL DATA:   64 year old female with diffuse abdominal and pelvic pain. History of metastatic lung cancer. EXAM: CT ABDOMEN AND PELVIS WITH CONTRAST TECHNIQUE: Multidetector CT imaging of the abdomen and pelvis was performed using the standard protocol following bolus administration of intravenous contrast. CONTRAST:  43m OMNIPAQUE IOHEXOL 300 MG/ML  SOLN COMPARISON:  08/09/2019 FINDINGS: Lower chest: No acute abnormality. Hepatobiliary: The liver and gallbladder are unremarkable. No biliary dilatation. Pancreas: Unremarkable Spleen: Unremarkable Adrenals/Urinary Tract: The kidneys, adrenal glands and bladder are unremarkable. Stomach/Bowel: A small to moderate amount of pneumoperitoneum is present within the abdomen and pelvis, and centered adjacent to the sigmoid colon, which exhibits mild wall thickening. Colonic diverticulosis is noted. No other bowel abnormalities are identified. The appendix is unremarkable. Vascular/Lymphatic: Aortic atherosclerosis. No enlarged abdominal or pelvic lymph nodes. Reproductive: Uterus and bilateral adnexa are unremarkable. Other: A small amount free fluid in the pelvis is noted. No discrete abscess is present. Musculoskeletal: No acute or suspicious bony abnormalities are noted. IMPRESSION: 1. Bowel perforation with small to moderate amount of pneumoperitoneum within the abdomen and pelvis. Pneumoperitoneum is centered adjacent to mildly thickened sigmoid colon, most likely representing perforation of the sigmoid colon, of uncertain etiology. Small amount of free fluid in the pelvis. No discrete abscess. 2. Aortic Atherosclerosis (ICD10-I70.0). Critical Value/emergent results were called by telephone at the time of interpretation on 10/30/2019 at 4:40 pm to provider ALaban Emperor who verbally acknowledged these results. Electronically Signed   By: JMargarette CanadaM.D.   On: 10/30/2019 16:42   [place] Img Lower Bilateral (DVT)  Result Date: 10/04/2019 CLINICAL DATA:  Bilateral lower  extremity pain and edema. History of lung cancer. Evaluate for DVT. EXAM: BILATERAL LOWER EXTREMITY VENOUS DOPPLER ULTRASOUND TECHNIQUE: Gray-scale sonography with graded compression, as well as color Doppler and duplex ultrasound were performed to evaluate the lower extremity deep venous systems from the level of the common femoral vein and including the common femoral, femoral, profunda femoral, popliteal and calf veins including the posterior tibial, peroneal and gastrocnemius veins when visible. The superficial great saphenous vein was also interrogated. Spectral Doppler was utilized to evaluate flow at rest and with distal augmentation maneuvers in the common femoral, femoral and popliteal veins. COMPARISON:  None. FINDINGS: RIGHT LOWER EXTREMITY Common Femoral Vein: No evidence of thrombus. Normal compressibility, respiratory phasicity and response to augmentation.  Saphenofemoral Junction: No evidence of thrombus. Normal compressibility and flow on color Doppler imaging. Profunda Femoral Vein: No evidence of thrombus. Normal compressibility and flow on color Doppler imaging. Femoral Vein: No evidence of thrombus. Normal compressibility, respiratory phasicity and response to augmentation. Popliteal Vein: No evidence of thrombus. Normal compressibility, respiratory phasicity and response to augmentation. Calf Veins: No evidence of thrombus. Normal compressibility and flow on color Doppler imaging. Superficial Great Saphenous Vein: No evidence of thrombus. Normal compressibility. Venous Reflux:  None. Other Findings:  None. LEFT LOWER EXTREMITY Common Femoral Vein: No evidence of thrombus. Normal compressibility, respiratory phasicity and response to augmentation. Saphenofemoral Junction: No evidence of thrombus. Normal compressibility and flow on color Doppler imaging. Profunda Femoral Vein: No evidence of thrombus. Normal compressibility and flow on color Doppler imaging. Femoral Vein: No evidence of thrombus.  Normal compressibility, respiratory phasicity and response to augmentation. Popliteal Vein: No evidence of thrombus. Normal compressibility, respiratory phasicity and response to augmentation. Calf Veins: No evidence of thrombus. Normal compressibility and flow on color Doppler imaging. Superficial Great Saphenous Vein: No evidence of thrombus. Normal compressibility. Venous Reflux:  None. Other Findings:  None. IMPRESSION: No evidence of DVT within either lower extremity. Electronically Signed   By: Sandi Mariscal M.D.   On: 10/04/2019 16:21   DG Chest Port 1 View  Result Date: 10/30/2019 CLINICAL DATA:  64 year old female with endotracheal and enteric tube placement. EXAM: PORTABLE CHEST AND ABDOMEN 1 VIEW COMPARISON:  CT abdomen pelvis dated 10/30/2019 and chest CT dated 08/09/2019. FINDINGS: An endotracheal tube is seen with tip approximately 3.5 cm above the carina. Enteric tube extends below the diaphragm with tip and side-port in the left upper abdomen likely in the body of the stomach. Right-sided Port-A-Cath with tip in the region of the cavoatrial junction. There is mild central vascular prominence. Diffuse bilateral interstitial prominence as well as streaky densities in the left lower lung field, new or progressed since the prior CT. Findings may represent combination of vascular congestion/edema and/or pneumonia. A small left pleural effusion may be present. No pneumothorax. The cardiac silhouette is within normal limits. Atherosclerotic calcification of the aorta. Air is noted within the colon. No bowel dilatation. A drainage catheter noted over the pelvis. Midline anterior pelvic wall cutaneous staples. No acute osseous pathology. IMPRESSION: 1. Endotracheal tube above the carina. Enteric tube with tip and side-port in the body of the stomach. 2. Probable mild vascular congestion or edema. Pneumonia is not excluded. Clinical correlation is recommended. Electronically Signed   By: Anner Crete  M.D.   On: 10/30/2019 23:47   CT IMAGE GUIDED DRAINAGE BY PERCUTANEOUS CATHETER  Result Date: 11/17/2019 INDICATION: History of perforated diverticulitis, post Hartmann's procedure, currently postoperative day 17 (Dr. Lysle Pearl). CT scan of the abdomen pelvis performed 11/16/2018 for evaluation of leukocytosis demonstrated an indeterminate fluid collection with the pelvic cul-de-sac. As such, request made for a CT-guided pelvic fluid collection aspiration and/or drainage catheter placement for infection source control purposes. EXAM: CT IMAGE GUIDED DRAINAGE BY PERCUTANEOUS CATHETER COMPARISON:  CT abdomen pelvis-11/16/2019; 11/08/2019 MEDICATIONS: The patient is currently admitted to the hospital and receiving intravenous antibiotics. The antibiotics were administered within an appropriate time frame prior to the initiation of the procedure. ANESTHESIA/SEDATION: Moderate (conscious) sedation was employed during this procedure. A total of Versed 2 mg and Fentanyl 100 mcg was administered intravenously. Moderate Sedation Time: 10 minutes. The patient's level of consciousness and vital signs were monitored continuously by radiology nursing throughout the procedure under my direct  supervision. CONTRAST:  None COMPLICATIONS: None immediate. PROCEDURE: Informed written consent was obtained from the patient after a discussion of the risks, benefits and alternatives to treatment. The patient was placed left lateral decubitus on the CT gantry and a pre procedural CT was performed re-demonstrating the known abscess/fluid collection within the pelvic cul-de-sac with dominant collection measuring approximately 6.7 x 4.8 cm (image 31, series 5). The procedure was planned. A timeout was performed prior to the initiation of the procedure. The skin overlying the right buttocks was prepped and draped in the usual sterile fashion. The overlying soft tissues were anesthetized with 1% lidocaine with epinephrine. Utilizing a right  trans gluteal approach, appropriate trajectory was planned with the use of a 22 gauge spinal needle. An 18 gauge trocar needle was advanced into the abscess/fluid collection and a short Amplatz super stiff wire was coiled within the collection. Appropriate positioning was confirmed with a limited CT scan. The tract was serially dilated allowing placement of a 10 [place] all-purpose drainage catheter. Appropriate positioning was confirmed with a limited postprocedural CT scan. Approximately 60 ml of purulent fluid was aspirated. The tube was connected to a drainage bag and sutured in place. A dressing was placed. The patient tolerated the procedure well without immediate post procedural complication. IMPRESSION: Successful CT guided placement of a 10 French all purpose drain catheter into the pelvic abscess via right trans gluteal approach with aspiration of 60 mL of purulent fluid. Samples were sent to the laboratory as requested by the ordering clinical team. Electronically Signed   By: Sandi Mariscal M.D.   On: 11/17/2019 15:19    ASSESSMENT & PLAN:  1. Adenocarcinoma of lung, stage 4, unspecified laterality ([place])   2. Brain metastasis ([place])   3. Current chronic use of systemic steroids   4. Goals of care, counseling/discussion   5. Current use of steroid medication   6. Chronic wound infection of abdomen, sequela   Cancer Staging Malignant neoplasm of lung [facility]) Staging form: Lung, AJCC 8th Edition - Clinical stage from 03/12/2018: Stage IV (cT1, cN3, pM1) - Signed by Earlie Server, MD on 03/12/2018  # EGFR mutation [[id number]] positive Stage IV lung adenocarcinoma.  Continue ulcer meropenem 80 mg daily.  #Brain metastasis status post SRS, radiation necrosis currently on dexamethasone 4 mg twice daily. Mental status remains stable.  No seizure activity. Continue Keppra.  Continue dexamethasone I will decrease dexamethasone to 3 mg twice daily. Repeat MRI brain in 2 weeks. Prolonged steroid use, recent  history of shingles, continue shingle prophylaxis with acyclovir 400 mg twice daily.  #Abdomen midline wound, continue wound care and follow-up with surgery. #Anemia, hemoglobin has been stable.  Continue to monitor. #Goals of care was discussed.  continue current scope of care.  Patient is full code.  Recommend patient to continue conversation with palliative care service.  Recommend DNR. Follow-up in 2 weeks for further discussion.   We spent sufficient time to discuss many aspect of care, questions were answered to patient's satisfaction. The patient knows to call the clinic with any problems questions or concerns.   Earlie Server, MD, PhD Hematology Oncology [facility] Pager- [phone] 12/01/2019

## 2019-12-02 ENCOUNTER — Ambulatory Visit (INDEPENDENT_AMBULATORY_CARE_PROVIDER_SITE_OTHER): Payer: 59 | Admitting: Family Medicine

## 2019-12-02 ENCOUNTER — Encounter: Payer: Self-pay | Admitting: Family Medicine

## 2019-12-02 ENCOUNTER — Other Ambulatory Visit: Payer: Self-pay

## 2019-12-02 DIAGNOSIS — F329 Major depressive disorder, single episode, unspecified: Secondary | ICD-10-CM

## 2019-12-02 DIAGNOSIS — I679 Cerebrovascular disease, unspecified: Secondary | ICD-10-CM

## 2019-12-02 DIAGNOSIS — F32A Depression, unspecified: Secondary | ICD-10-CM

## 2019-12-02 DIAGNOSIS — E039 Hypothyroidism, unspecified: Secondary | ICD-10-CM | POA: Diagnosis not present

## 2019-12-02 DIAGNOSIS — Z09 Encounter for follow-up examination after completed treatment for conditions other than malignant neoplasm: Secondary | ICD-10-CM

## 2019-12-02 DIAGNOSIS — Z7952 Long term (current) use of systemic steroids: Secondary | ICD-10-CM

## 2019-12-02 DIAGNOSIS — D84821 Immunodeficiency due to drugs: Secondary | ICD-10-CM

## 2019-12-02 DIAGNOSIS — F419 Anxiety disorder, unspecified: Secondary | ICD-10-CM | POA: Diagnosis not present

## 2019-12-02 NOTE — Addendum Note (Signed)
Addended by: Juline Patch on: 12/02/2019 01:45 PM   Modules accepted: Level of Service

## 2019-12-02 NOTE — Progress Notes (Signed)
Date:  12/02/2019   Name:  Kristina Wright   DOB:  1955/12/21   MRN:  [id number]   Chief Complaint: No chief complaint on file.  Pt was recently admitted to [facility] on 7/11 and was discharged on 8/2. Transition of care callnot placed    Depression      (For cerebral vascular disease)  This is a chronic problem.  The current episode started more than 1 year ago.   The onset quality is gradual.   Associated symptoms include hopelessness.  Associated symptoms include no fatigue, no myalgias and no headaches.     The symptoms are aggravated by nothing.  Past treatments include SSRIs - Selective serotonin reuptake inhibitors.  Past medical history includes thyroid problem and anxiety.   Anxiety Presents for follow-up visit. Symptoms include depressed mood, excessive worry and irritability. Patient reports no dizziness, nausea, nervous/anxious behavior or shortness of breath. Primary symptoms comment: for cerebral vascular disease.    Neurologic Problem The patient's pertinent negatives include no altered mental status, clumsiness, focal weakness, loss of balance, memory loss, slurred speech, syncope, visual change or weakness. Primary symptoms comment: for cerebral vascular disease. This is a chronic problem. The current episode started yesterday. The problem has been gradually improving since onset. Pertinent negatives include no abdominal pain, back pain, dizziness, fatigue, fever, headaches, light-headedness, nausea, shortness of breath or vomiting.  Thyroid Problem Presents for follow-up visit. Symptoms include depressed mood. Patient reports no anxiety, cold intolerance, constipation, diarrhea, fatigue, heat intolerance, hoarse voice, leg swelling, menstrual problem or visual change. The symptoms have been stable.    Lab Results  Component Value Date   CREATININE 0.42 (L) 12/01/2019   BUN 15 12/01/2019   NA 134 (L) 12/01/2019   K 3.6 12/01/2019   CL 99 12/01/2019   CO2 24  12/01/2019   Lab Results  Component Value Date   CHOL 150 08/02/2019   HDL 45 08/02/2019   LDLCALC 84 08/02/2019   TRIG 163 (H) 10/31/2019   CHOLHDL 3.5 03/03/2019   Lab Results  Component Value Date   TSH 3.341 06/20/2019   Lab Results  Component Value Date   HGBA1C 6.5 (H) 10/31/2019   Lab Results  Component Value Date   WBC 9.4 12/01/2019   HGB 10.3 (L) 12/01/2019   HCT 31.6 (L) 12/01/2019   MCV 93.8 12/01/2019   PLT 282 12/01/2019   Lab Results  Component Value Date   ALT 45 (H) 12/01/2019   AST 25 12/01/2019   ALKPHOS 81 12/01/2019   BILITOT 0.4 12/01/2019     Review of Systems  Constitutional: Positive for irritability. Negative for chills, fatigue, fever and unexpected weight change.  HENT: Negative for congestion, ear discharge, ear pain, hoarse voice, rhinorrhea, sinus pressure, sneezing and sore throat.   Eyes: Negative for photophobia, pain, discharge, redness and itching.  Respiratory: Negative for cough, shortness of breath, wheezing and stridor.   Gastrointestinal: Negative for abdominal pain, blood in stool, constipation, diarrhea, nausea and vomiting.  Endocrine: Negative for cold intolerance, heat intolerance, polydipsia, polyphagia and polyuria.  Genitourinary: Negative for dysuria, flank pain, frequency, hematuria, menstrual problem, pelvic pain, urgency, vaginal bleeding and vaginal discharge.  Musculoskeletal: Negative for arthralgias, back pain and myalgias.  Skin: Negative for rash.  Allergic/Immunologic: Negative for environmental allergies and food allergies.  Neurological: Negative for dizziness, focal weakness, syncope, weakness, light-headedness, numbness, headaches and loss of balance.  Hematological: Negative for adenopathy. Does not bruise/bleed easily.  Psychiatric/Behavioral: Positive  for depression. Negative for dysphoric mood and memory loss. The patient is not nervous/anxious.     Patient Active Problem List   Diagnosis Date  Noted  . Current use of steroid medication 12/01/2019  . Abdominal fluid collection   . Ileus ([place])   . Immunosuppression due to chronic steroid use   . Palliative care by specialist   . Encounter for orogastric tube placement   . DNR (do not resuscitate)   . Brain edema ([place])   . Normocytic anemia   . Pressure injury of skin 11/02/2019  . Bowel perforation ([place]) 10/30/2019  . Bilateral lower extremity edema 10/07/2019  . Herpes zoster without complication 83/41/9622  . Endotracheal tube present 10/01/2019  . Brain metastasis ([place]) 08/05/2019  . Elevated TSH 06/20/2019  . Chronic obstructive pulmonary disease ([place]) 12/13/2018  . Left lower lobe pulmonary nodule 11/29/2018  . Breast nodule 08/25/2018  . Anxiety 08/22/2018  . Genetic testing 06/11/2018  . Family history of breast cancer   . Family history of colon cancer   . Family history of ovarian cancer   . Family history of stomach cancer   . Family history of throat cancer   . Metastatic adenocarcinoma to lung with unknown primary site [facility]) 04/27/2018  . Depression 03/12/2018  . Malignant neoplasm of lung ([place]) 03/01/2018  . Goals of care, counseling/discussion 03/01/2018  . Tobacco use disorder 02/08/2016  . Essential hypertension 02/08/2016  . PVD (peripheral vascular disease) ([place]) 02/08/2016  . Blue toe syndrome of right lower extremity ([place]) 02/08/2016  . Adult BMI > 30 07/31/2015  . High risk medication use 07/31/2015  . Carpal tunnel syndrome on both sides 10/30/2014  . Postmenopause atrophic vaginitis 05/15/2014  . Sensory urge incontinence 05/15/2014  . Vitamin D deficiency 04/25/2014  . Extremity pain 11/22/2013  . Numbness 11/22/2013  . Sleep disorder 11/22/2013  . Chronic insomnia 11/10/2013  . Foot pain, left 11/10/2013  . Hypothyroidism 11/10/2013  . Mixed hyperlipidemia 11/10/2013    No Known Allergies  Past Surgical History:  Procedure Laterality Date  . BILATERAL CARPAL TUNNEL RELEASE    .  BOWEL RESECTION N/A 10/30/2019   Procedure: SMALL BOWEL RESECTION;  Surgeon: Benjamine Sprague, DO;  Location: [facility] ORS;  Service: General;  Laterality: N/A;  . BREAST CYST ASPIRATION Left   . LAPAROTOMY N/A 10/30/2019   Procedure: EXPLORATORY LAPAROTOMY;  Surgeon: Benjamine Sprague, DO;  Location: [facility] ORS;  Service: General;  Laterality: N/A;  . LUNG BIOPSY    . PARTIAL HYSTERECTOMY    . PORTA CATH INSERTION N/A 03/03/2018   Procedure: PORTA CATH INSERTION;  Surgeon: Katha Cabal, MD;  Location: [facility];  Service: Cardiovascular;  Laterality: N/A;  . TUBAL LIGATION      Social History   Tobacco Use  . Smoking status: Former Smoker    Packs/day: 1.00    Years: 25.00    Pack years: 25.00    Types: Cigarettes    Quit date: 11/19/2017    Years since quitting: 2.0  . Smokeless tobacco: Never Used  Vaping Use  . Vaping Use: Never used  Substance Use Topics  . Alcohol use: No  . Drug use: Yes    Types: Marijuana    Comment: occasional     Medication list has been reviewed and updated.  No outpatient medications have been marked as taking for the 12/02/19 encounter (Appointment) with Juline Patch, MD.    [facility] 2/9 Scores 08/01/2019 03/03/2019 12/13/2018 01/07/2018  PHQ -  2 Score 2 0 0 1  PHQ- 9 Score 4 1 1 6     GAD 7 : Generalized Anxiety Score 08/01/2019 03/03/2019 08/16/2018 04/27/2018  Nervous, Anxious, on Edge 2 1 1 2   Control/stop worrying 2 1 1 2   Worry too much - different things 2 2 2 2   Trouble relaxing 0 0 2 2  Restless 0 0 0 0  Easily annoyed or irritable 0 0 0 0  Afraid - awful might happen 0 0 0 1  Total GAD 7 Score 6 4 6 9   Anxiety Difficulty Somewhat difficult Somewhat difficult Not difficult at all Not difficult at all    BP Readings from Last 3 Encounters:  12/01/19 (!) 146/84  11/21/19 (!) 150/73  10/20/19 (!) 147/84    Physical Exam Vitals and nursing note reviewed.  Neurological:     Mental Status: She is alert.     Wt Readings from Last  3 Encounters:  11/06/19 224 lb 6.9 oz (101.8 kg)  10/20/19 227 lb 4.8 oz (103.1 kg)  10/14/19 232 lb (105.2 kg)    There were no vitals taken for this visit.  Assessment and Plan:   1. Hospital discharge follow-up Patient with hospital discharge follow-up visit done by televisit to her skilled nursing home room.  Patient is doing well with anticipation of possibly being discharged home in the future.  We reviewed her recovery and diagnoses that we have treated in the past  2. Anxiety Chronic.  Controlled.  Stable.  Gad score currently 1 patient is continuing on medication/Prozac for anxiety and is tolerating well  3. Depression, unspecified depression type Chronic.  Controlled.  Stable.  PHQ score 2.  Patient is continued on Prozac per skilled nursing home care.  4. Hypothyroidism, unspecified type Chronic.  Controlled.  Stable.  By history patient is doing well on current dosing of levothyroxine 112 mcg daily.  5. Cerebral vascular disease Chronic.  Controlled.  Stable.  There is been no acute progression of her cerebrovascular disease with a stroke in the past.  Patient does have metastases to the head brain but is tolerating well and is followed by oncology.  I spent 25 minutes with this patient, More than 50% of that time was spent in voice to voice education, counseling and care coordination.

## 2019-12-06 ENCOUNTER — Telehealth: Payer: Self-pay | Admitting: Family Medicine

## 2019-12-06 NOTE — Telephone Encounter (Signed)
Patient called to ask why her medication for ALPRAZolam Kristina Wright) 0.25 MG tablet was discontinued according to [facility] where she is staying.  She stated that they said that she could not get it.  Please advise and call patient to discuss at [phone]

## 2019-12-06 NOTE — Telephone Encounter (Signed)
Spoke with patient. She said she spoke with [facility] and they will reorder the medicine. Told her Dr Ronnald Ramp said she did not stop this medication but it is up to the doctors where she is staying.   CM

## 2019-12-07 ENCOUNTER — Other Ambulatory Visit (HOSPITAL_COMMUNITY): Payer: Self-pay | Admitting: Geriatric Medicine

## 2019-12-07 ENCOUNTER — Other Ambulatory Visit (HOSPITAL_COMMUNITY): Payer: Self-pay | Admitting: Surgery

## 2019-12-07 ENCOUNTER — Telehealth (HOSPITAL_COMMUNITY): Payer: Self-pay

## 2019-12-07 DIAGNOSIS — K651 Peritoneal abscess: Secondary | ICD-10-CM

## 2019-12-07 NOTE — Telephone Encounter (Signed)
Called [place] from [facility] to give appt info, no answer, left vm. AW

## 2019-12-13 ENCOUNTER — Other Ambulatory Visit: Payer: 59

## 2019-12-14 ENCOUNTER — Other Ambulatory Visit: Payer: Self-pay | Admitting: *Deleted

## 2019-12-14 DIAGNOSIS — C7800 Secondary malignant neoplasm of unspecified lung: Secondary | ICD-10-CM

## 2019-12-14 DIAGNOSIS — C801 Malignant (primary) neoplasm, unspecified: Secondary | ICD-10-CM

## 2019-12-14 MED ORDER — OSIMERTINIB MESYLATE 80 MG PO TABS: 80.0000 mg | ORAL_TABLET | Freq: Every day | ORAL | 2 refills | Status: AC

## 2019-12-15 ENCOUNTER — Telehealth (HOSPITAL_COMMUNITY): Payer: Self-pay

## 2019-12-15 NOTE — Telephone Encounter (Signed)
Called to reschedule ct/ir eval, no answer, left vm. AW

## 2019-12-16 ENCOUNTER — Ambulatory Visit (HOSPITAL_COMMUNITY): Payer: 59

## 2019-12-16 ENCOUNTER — Encounter (HOSPITAL_COMMUNITY): Payer: Self-pay

## 2019-12-17 ENCOUNTER — Ambulatory Visit: Payer: 59

## 2019-12-18 ENCOUNTER — Emergency Department: Payer: 59

## 2019-12-18 ENCOUNTER — Inpatient Hospital Stay
Admission: EM | Admit: 2019-12-18 | Discharge: 2019-12-24 | DRG: 862 | Disposition: A | Discharge status: Home / Self Care | Payer: 59 | Attending: Surgery | Admitting: Surgery

## 2019-12-18 ENCOUNTER — Other Ambulatory Visit: Payer: Self-pay

## 2019-12-18 DIAGNOSIS — C7931 Secondary malignant neoplasm of brain: Secondary | ICD-10-CM | POA: Diagnosis present

## 2019-12-18 DIAGNOSIS — Z933 Colostomy status: Secondary | ICD-10-CM

## 2019-12-18 DIAGNOSIS — Z79899 Other long term (current) drug therapy: Secondary | ICD-10-CM | POA: Diagnosis not present

## 2019-12-18 DIAGNOSIS — Z20822 Contact with and (suspected) exposure to covid-19: Secondary | ICD-10-CM | POA: Diagnosis present

## 2019-12-18 DIAGNOSIS — E039 Hypothyroidism, unspecified: Secondary | ICD-10-CM | POA: Diagnosis present

## 2019-12-18 DIAGNOSIS — K651 Peritoneal abscess: Secondary | ICD-10-CM | POA: Diagnosis present

## 2019-12-18 DIAGNOSIS — Z79890 Hormone replacement therapy (postmenopausal): Secondary | ICD-10-CM | POA: Diagnosis not present

## 2019-12-18 DIAGNOSIS — E782 Mixed hyperlipidemia: Secondary | ICD-10-CM | POA: Diagnosis present

## 2019-12-18 DIAGNOSIS — C3490 Malignant neoplasm of unspecified part of unspecified bronchus or lung: Secondary | ICD-10-CM | POA: Diagnosis present

## 2019-12-18 DIAGNOSIS — Z7982 Long term (current) use of aspirin: Secondary | ICD-10-CM | POA: Diagnosis not present

## 2019-12-18 DIAGNOSIS — L03317 Cellulitis of buttock: Secondary | ICD-10-CM | POA: Diagnosis present

## 2019-12-18 DIAGNOSIS — Y848 Other medical procedures as the cause of abnormal reaction of the patient, or of later complication, without mention of misadventure at the time of the procedure: Secondary | ICD-10-CM | POA: Diagnosis present

## 2019-12-18 DIAGNOSIS — T8143XA Infection following a procedure, organ and space surgical site, initial encounter: Principal | ICD-10-CM | POA: Diagnosis present

## 2019-12-18 DIAGNOSIS — R188 Other ascites: Secondary | ICD-10-CM

## 2019-12-18 DIAGNOSIS — I10 Essential (primary) hypertension: Secondary | ICD-10-CM | POA: Diagnosis present

## 2019-12-18 DIAGNOSIS — N739 Female pelvic inflammatory disease, unspecified: Secondary | ICD-10-CM

## 2019-12-18 DIAGNOSIS — Z87891 Personal history of nicotine dependence: Secondary | ICD-10-CM | POA: Diagnosis not present

## 2019-12-18 DIAGNOSIS — Z801 Family history of malignant neoplasm of trachea, bronchus and lung: Secondary | ICD-10-CM | POA: Diagnosis not present

## 2019-12-18 DIAGNOSIS — Z7902 Long term (current) use of antithrombotics/antiplatelets: Secondary | ICD-10-CM

## 2019-12-18 DIAGNOSIS — R5381 Other malaise: Secondary | ICD-10-CM | POA: Diagnosis present

## 2019-12-18 DIAGNOSIS — Z7952 Long term (current) use of systemic steroids: Secondary | ICD-10-CM

## 2019-12-18 DIAGNOSIS — E785 Hyperlipidemia, unspecified: Secondary | ICD-10-CM | POA: Diagnosis present

## 2019-12-18 LAB — CBC WITH DIFFERENTIAL/PLATELET
Abs Immature Granulocytes: 0.14 10*3/uL — ABNORMAL HIGH (ref 0.00–0.07)
Basophils Absolute: 0 10*3/uL (ref 0.0–0.1)
Basophils Relative: 0 %
Eosinophils Absolute: 0 10*3/uL (ref 0.0–0.5)
Eosinophils Relative: 0 %
HCT: 28.4 % — ABNORMAL LOW (ref 36.0–46.0)
Hemoglobin: 9 g/dL — ABNORMAL LOW (ref 12.0–15.0)
Immature Granulocytes: 2 %
Lymphocytes Relative: 19 %
Lymphs Abs: 1.5 10*3/uL (ref 0.7–4.0)
MCH: 30.6 pg (ref 26.0–34.0)
MCHC: 31.7 g/dL (ref 30.0–36.0)
MCV: 96.6 fL (ref 80.0–100.0)
Monocytes Absolute: 0.5 10*3/uL (ref 0.1–1.0)
Monocytes Relative: 7 %
Neutro Abs: 5.5 10*3/uL (ref 1.7–7.7)
Neutrophils Relative %: 72 %
Platelets: 184 10*3/uL (ref 150–400)
RBC: 2.94 MIL/uL — ABNORMAL LOW (ref 3.87–5.11)
RDW: 16.3 % — ABNORMAL HIGH (ref 11.5–15.5)
Smear Review: NORMAL
WBC: 7.6 10*3/uL (ref 4.0–10.5)
nRBC: 0.7 % — ABNORMAL HIGH (ref 0.0–0.2)

## 2019-12-18 LAB — LACTIC ACID, PLASMA
Lactic Acid, Venous: 2 mmol/L (ref 0.5–1.9)
Lactic Acid, Venous: 2.4 mmol/L (ref 0.5–1.9)

## 2019-12-18 LAB — COMPREHENSIVE METABOLIC PANEL
ALT: 33 U/L (ref 0–44)
AST: 23 U/L (ref 15–41)
Albumin: 2.3 g/dL — ABNORMAL LOW (ref 3.5–5.0)
Alkaline Phosphatase: 74 U/L (ref 38–126)
Anion gap: 9 (ref 5–15)
BUN: 14 mg/dL (ref 8–23)
CO2: 25 mmol/L (ref 22–32)
Calcium: 7.9 mg/dL — ABNORMAL LOW (ref 8.9–10.3)
Chloride: 106 mmol/L (ref 98–111)
Creatinine, Ser: 0.41 mg/dL — ABNORMAL LOW (ref 0.44–1.00)
GFR calc Af Amer: >60 mL/min (ref >60)
GFR calc non Af Amer: >60 mL/min (ref >60)
Glucose, Bld: 137 mg/dL — ABNORMAL HIGH (ref 70–99)
Potassium: 3.6 mmol/L (ref 3.5–5.1)
Sodium: 140 mmol/L (ref 135–145)
Total Bilirubin: 0.5 mg/dL (ref 0.3–1.2)
Total Protein: 5.5 g/dL — ABNORMAL LOW (ref 6.5–8.1)

## 2019-12-18 LAB — SARS CORONAVIRUS 2 BY RT PCR (HOSPITAL ORDER, PERFORMED IN ~~LOC~~ HOSPITAL LAB): SARS Coronavirus 2: NEGATIVE

## 2019-12-18 MED ORDER — ADULT MULTIVITAMIN W/MINERALS CH
ORAL_TABLET | Freq: Every day | ORAL | Status: DC
  Administered 2019-12-22 – 2019-12-23 (×2): 1 via ORAL
  Filled 2019-12-18 (×5): qty 1

## 2019-12-18 MED ORDER — LISINOPRIL 10 MG PO TABS
5.0000 mg | ORAL_TABLET | Freq: Every day | ORAL | Status: DC
  Administered 2019-12-19 – 2019-12-23 (×5): 5 mg via ORAL
  Filled 2019-12-18 (×6): qty 1

## 2019-12-18 MED ORDER — LEVETIRACETAM 500 MG PO TABS
500.0000 mg | ORAL_TABLET | Freq: Two times a day (BID) | ORAL | Status: DC
  Administered 2019-12-19 – 2019-12-23 (×10): 500 mg via ORAL
  Filled 2019-12-18 (×8): qty 1

## 2019-12-18 MED ORDER — FUROSEMIDE 20 MG PO TABS
20.0000 mg | ORAL_TABLET | Freq: Every day | ORAL | Status: DC
  Administered 2019-12-19 – 2019-12-23 (×5): 20 mg via ORAL
  Filled 2019-12-18 (×5): qty 1

## 2019-12-18 MED ORDER — HYDROCODONE-ACETAMINOPHEN 5-325 MG PO TABS
1.0000 | ORAL_TABLET | Freq: Four times a day (QID) | ORAL | Status: DC | PRN
  Administered 2019-12-19 (×2): 1 via ORAL
  Administered 2019-12-19: 2 via ORAL
  Administered 2019-12-20: 1 via ORAL
  Administered 2019-12-21: 2 via ORAL
  Administered 2019-12-21 – 2019-12-22 (×3): 1 via ORAL
  Administered 2019-12-22 (×2): 2 via ORAL
  Administered 2019-12-23: 1 via ORAL
  Administered 2019-12-23: 2 via ORAL
  Filled 2019-12-18 (×2): qty 1
  Filled 2019-12-18 (×5): qty 2
  Filled 2019-12-18 (×2): qty 1
  Filled 2019-12-18: qty 2
  Filled 2019-12-18 (×2): qty 1

## 2019-12-18 MED ORDER — SODIUM CHLORIDE 0.9 % IV SOLN: INTRAVENOUS | Status: DC

## 2019-12-18 MED ORDER — IOHEXOL 300 MG/ML  SOLN
100.0000 mL | Freq: Once | INTRAMUSCULAR | Status: AC | PRN
  Administered 2019-12-18: 100 mL via INTRAVENOUS

## 2019-12-18 MED ORDER — UMECLIDINIUM-VILANTEROL 62.5-25 MCG/INH IN AEPB
1.0000 | INHALATION_SPRAY | Freq: Every day | RESPIRATORY_TRACT | Status: DC
  Administered 2019-12-19 – 2019-12-23 (×5): 1 via RESPIRATORY_TRACT
  Filled 2019-12-18: qty 14

## 2019-12-18 MED ORDER — ALPRAZOLAM 0.25 MG PO TABS
0.2500 mg | ORAL_TABLET | Freq: Every evening | ORAL | Status: DC | PRN
  Administered 2019-12-19 – 2019-12-22 (×5): 0.25 mg via ORAL
  Filled 2019-12-18 (×6): qty 1

## 2019-12-18 MED ORDER — ATORVASTATIN CALCIUM 10 MG PO TABS
10.0000 mg | ORAL_TABLET | Freq: Every day | ORAL | Status: DC
  Administered 2019-12-19 – 2019-12-23 (×5): 10 mg via ORAL
  Filled 2019-12-18 (×5): qty 1

## 2019-12-18 MED ORDER — LABETALOL HCL 5 MG/ML IV SOLN
10.0000 mg | Freq: Once | INTRAVENOUS | Status: AC
  Administered 2019-12-18: 10 mg via INTRAVENOUS
  Filled 2019-12-18: qty 4

## 2019-12-18 MED ORDER — ONDANSETRON 4 MG PO TBDP: 4.0000 mg | ORAL_TABLET | Freq: Four times a day (QID) | ORAL | Status: DC | PRN

## 2019-12-18 MED ORDER — OSIMERTINIB MESYLATE 80 MG PO TABS: 80.0000 mg | ORAL_TABLET | Freq: Every day | ORAL | Status: DC

## 2019-12-18 MED ORDER — MUPIROCIN 2 % EX OINT
1.0000 "application " | TOPICAL_OINTMENT | Freq: Two times a day (BID) | CUTANEOUS | Status: DC
  Filled 2019-12-18: qty 22

## 2019-12-18 MED ORDER — COLLAGENASE 250 UNIT/GM EX OINT
1.0000 "application " | TOPICAL_OINTMENT | Freq: Every day | CUTANEOUS | Status: DC
  Administered 2019-12-19 – 2019-12-23 (×5): 1 via TOPICAL
  Filled 2019-12-18 (×2): qty 30

## 2019-12-18 MED ORDER — LIDOCAINE-PRILOCAINE 2.5-2.5 % EX CREA
1.0000 "application " | TOPICAL_CREAM | CUTANEOUS | Status: DC | PRN
  Filled 2019-12-18: qty 5

## 2019-12-18 MED ORDER — LEVOTHYROXINE SODIUM 112 MCG PO TABS
112.0000 ug | ORAL_TABLET | Freq: Every day | ORAL | Status: DC
  Administered 2019-12-19 – 2019-12-23 (×5): 112 ug via ORAL
  Filled 2019-12-18 (×6): qty 1

## 2019-12-18 MED ORDER — DEXAMETHASONE 6 MG PO TABS
3.0000 mg | ORAL_TABLET | Freq: Two times a day (BID) | ORAL | Status: DC
  Administered 2019-12-19 – 2019-12-22 (×6): 3 mg via ORAL
  Filled 2019-12-18 (×4): qty 0.5
  Filled 2019-12-18: qty 6
  Filled 2019-12-18: qty 0.5
  Filled 2019-12-18 (×2): qty 6
  Filled 2019-12-18: qty 0.5

## 2019-12-18 MED ORDER — NYSTATIN 100000 UNIT/GM EX CREA
1.0000 "application " | TOPICAL_CREAM | Freq: Two times a day (BID) | CUTANEOUS | Status: DC
  Administered 2019-12-20 – 2019-12-23 (×3): 1 via TOPICAL
  Filled 2019-12-18 (×2): qty 15

## 2019-12-18 MED ORDER — LACTATED RINGERS IV BOLUS
1000.0000 mL | Freq: Once | INTRAVENOUS | Status: AC
  Administered 2019-12-18: 1000 mL via INTRAVENOUS

## 2019-12-18 MED ORDER — PANTOPRAZOLE SODIUM 40 MG PO TBEC
40.0000 mg | DELAYED_RELEASE_TABLET | Freq: Every day | ORAL | Status: DC
  Administered 2019-12-20 – 2019-12-23 (×4): 40 mg via ORAL
  Filled 2019-12-18 (×5): qty 1

## 2019-12-18 MED ORDER — ONDANSETRON HCL 4 MG/2ML IJ SOLN: 4.0000 mg | Freq: Four times a day (QID) | INTRAMUSCULAR | Status: DC | PRN

## 2019-12-18 MED ORDER — ALBUTEROL SULFATE (2.5 MG/3ML) 0.083% IN NEBU: 2.5000 mg | INHALATION_SOLUTION | Freq: Four times a day (QID) | RESPIRATORY_TRACT | Status: DC | PRN

## 2019-12-18 MED ORDER — FLUOXETINE HCL 20 MG PO CAPS
20.0000 mg | ORAL_CAPSULE | Freq: Every day | ORAL | Status: DC
  Administered 2019-12-19 – 2019-12-23 (×5): 20 mg via ORAL
  Filled 2019-12-18 (×8): qty 1

## 2019-12-18 MED ORDER — ACYCLOVIR 200 MG PO CAPS
400.0000 mg | ORAL_CAPSULE | Freq: Two times a day (BID) | ORAL | Status: DC
  Administered 2019-12-19 – 2019-12-23 (×9): 400 mg via ORAL
  Filled 2019-12-18 (×12): qty 2

## 2019-12-18 MED ORDER — OLOPATADINE HCL 0.1 % OP SOLN
1.0000 [drp] | Freq: Two times a day (BID) | OPHTHALMIC | Status: DC
  Administered 2019-12-19 – 2019-12-23 (×9): 1 [drp] via OPHTHALMIC
  Filled 2019-12-18: qty 5

## 2019-12-18 MED ORDER — VANCOMYCIN HCL IN DEXTROSE 1-5 GM/200ML-% IV SOLN
1000.0000 mg | Freq: Once | INTRAVENOUS | Status: AC
  Administered 2019-12-18: 1000 mg via INTRAVENOUS
  Filled 2019-12-18: qty 200

## 2019-12-18 MED ORDER — BUSPIRONE HCL 5 MG PO TABS
5.0000 mg | ORAL_TABLET | Freq: Every day | ORAL | Status: DC
  Administered 2019-12-19 – 2019-12-23 (×5): 5 mg via ORAL
  Filled 2019-12-18 (×6): qty 1

## 2019-12-18 MED ORDER — SODIUM CHLORIDE 0.9 % IV SOLN
1.0000 g | Freq: Once | INTRAVENOUS | Status: AC
  Administered 2019-12-18: 1 g via INTRAVENOUS
  Filled 2019-12-18: qty 1

## 2019-12-18 NOTE — ED Notes (Signed)
Pt states her port was flushed with heparin 2-3 weeks ago.

## 2019-12-18 NOTE — ED Provider Notes (Addendum)
[facility] Emergency Department Provider Note   ____________________________________________   First MD Initiated Contact with Patient 12/18/19 1629     (approximate)  I have reviewed the triage vital signs and the nursing notes.   HISTORY  Chief Complaint Post-op Problem    HPI Kristina Wright is a 64 y.o. female with past medical history of perforated diverticulitis status post Hartman's procedure, gluteal abscess status post drain, lung cancer metastatic to brain, hypertension, and hyperlipidemia who presents to the ED complaining of postop problem.  Patient had abdominal JP drain in place and states this was removed 2 weeks ago.  She additionally had a drain in place to her right buttock for posterior pelvic abscess.  She states this drain was displaced 2 days ago and since then has been draining pus.  Due to increased pus drainage today, the doctor was contacted at [facility] and advised she be transported to the ED.  Patient denies any fevers, abdominal pain, nausea, vomiting, or changes in her colostomy output.  She was treated for perforated diverticulitis with Hartman's procedure on July 11, has been residing at [facility] since then.        Past Medical History:  Diagnosis Date  . Cancer ([place]) 1989   cervical  . Depression   . Family history of breast cancer   . Family history of colon cancer   . Family history of ovarian cancer   . Family history of stomach cancer   . Family history of throat cancer   . Hyperlipidemia   . Hypertension   . Malignant neoplasm of lung ([place]) 03/01/2018  . Thyroid disease     Patient Active Problem List   Diagnosis Date Noted  . Current use of steroid medication 12/01/2019  . Abdominal fluid collection   . Ileus ([place])   . Immunosuppression due to chronic steroid use   . Palliative care by specialist   . Encounter for orogastric tube placement   . DNR (do not resuscitate)   . Brain edema ([place])     . Normocytic anemia   . Pressure injury of skin 11/02/2019  . Bowel perforation ([place]) 10/30/2019  . Bilateral lower extremity edema 10/07/2019  . Herpes zoster without complication 95/63/8756  . Endotracheal tube present 10/01/2019  . Brain metastasis ([place]) 08/05/2019  . Elevated TSH 06/20/2019  . Chronic obstructive pulmonary disease ([place]) 12/13/2018  . Left lower lobe pulmonary nodule 11/29/2018  . Breast nodule 08/25/2018  . Anxiety 08/22/2018  . Genetic testing 06/11/2018  . Family history of breast cancer   . Family history of colon cancer   . Family history of ovarian cancer   . Family history of stomach cancer   . Family history of throat cancer   . Metastatic adenocarcinoma to lung with unknown primary site [facility]) 04/27/2018  . Depression 03/12/2018  . Malignant neoplasm of lung ([place]) 03/01/2018  . Goals of care, counseling/discussion 03/01/2018  . Tobacco use disorder 02/08/2016  . Essential hypertension 02/08/2016  . PVD (peripheral vascular disease) ([place]) 02/08/2016  . Blue toe syndrome of right lower extremity ([place]) 02/08/2016  . Adult BMI > 30 07/31/2015  . High risk medication use 07/31/2015  . Carpal tunnel syndrome on both sides 10/30/2014  . Postmenopause atrophic vaginitis 05/15/2014  . Sensory urge incontinence 05/15/2014  . Vitamin D deficiency 04/25/2014  . Extremity pain 11/22/2013  . Numbness 11/22/2013  . Sleep disorder 11/22/2013  . Chronic insomnia 11/10/2013  . Foot  pain, left 11/10/2013  . Hypothyroidism 11/10/2013  . Mixed hyperlipidemia 11/10/2013    Past Surgical History:  Procedure Laterality Date  . BILATERAL CARPAL TUNNEL RELEASE    . BOWEL RESECTION N/A 10/30/2019   Procedure: SMALL BOWEL RESECTION;  Surgeon: Benjamine Sprague, DO;  Location: [facility] ORS;  Service: General;  Laterality: N/A;  . BREAST CYST ASPIRATION Left   . LAPAROTOMY N/A 10/30/2019   Procedure: EXPLORATORY LAPAROTOMY;  Surgeon: Benjamine Sprague, DO;  Location: [facility] ORS;   Service: General;  Laterality: N/A;  . LUNG BIOPSY    . PARTIAL HYSTERECTOMY    . PORTA CATH INSERTION N/A 03/03/2018   Procedure: PORTA CATH INSERTION;  Surgeon: Katha Cabal, MD;  Location: [facility];  Service: Cardiovascular;  Laterality: N/A;  . TUBAL LIGATION      Prior to Admission medications   Medication Sig Start Date End Date Taking? Authorizing Provider  acyclovir (ZOVIRAX) 200 MG capsule Take 2 capsules (400 mg total) by mouth 2 (two) times daily. 11/21/19  Yes Georgette Shell, MD  ALPRAZolam Duanne Moron) 0.25 MG tablet Take 1 tablet (0.25 mg total) by mouth at bedtime as needed. for anxiety AS NEEDED ONLY! 09/23/19  Yes Juline Patch, MD  aspirin EC 81 MG tablet Take 81 mg by mouth daily.   Yes [provider]  atorvastatin (LIPITOR) 10 MG tablet Take 1 tablet (10 mg total) by mouth daily. 08/01/19  Yes Juline Patch, MD  busPIRone (BUSPAR) 5 MG tablet Take 1 tablet (5 mg total) by mouth daily. 08/01/19  Yes Juline Patch, MD  cephALEXin (KEFLEX) 500 MG capsule Take 500 mg by mouth 2 (two) times daily.   Yes [provider]  clopidogrel (PLAVIX) 75 MG tablet Take 1 tablet (75 mg total) by mouth daily. 09/16/19  Yes Juline Patch, MD  collagenase (SANTYL) ointment Apply 1 application topically daily.   Yes [provider]  dexamethasone (DECADRON) 6 MG tablet Take 3 mg by mouth 2 (two) times daily.   Yes [provider]  FLUoxetine (PROZAC) 20 MG capsule Take 20 mg by mouth daily.   Yes [provider]  furosemide (LASIX) 20 MG tablet Take 1 tablet (20 mg total) by mouth daily. 11/21/19  Yes Georgette Shell, MD  HYDROcodone-acetaminophen (NORCO/VICODIN) 5-325 MG tablet Take 1-2 tablets by mouth every 6 (six) hours as needed for moderate pain.    Yes [provider]  levETIRAcetam (KEPPRA) 500 MG tablet Take 1 tablet (500 mg total) by mouth 2 (two) times daily. 09/30/19  Yes Earlie Server, MD  levothyroxine  (SYNTHROID) 112 MCG tablet Take 1 tablet (112 mcg total) by mouth daily. 08/01/19  Yes Juline Patch, MD  lisinopril (ZESTRIL) 5 MG tablet Take 1 tablet (5 mg total) by mouth daily. 11/21/19 11/20/20 Yes Georgette Shell, MD  Multiple Vitamins-Minerals (MULTIVITAMIN ADULT PO) Take 1 tablet by mouth daily.    Yes [provider]  Olopatadine HCl 0.2 % SOLN Apply 1 drop to eye in the morning and at bedtime. 09/26/19  Yes Juline Patch, MD  omeprazole (PRILOSEC) 20 MG capsule Take 1 capsule (20 mg total) by mouth daily. 10/07/19  Yes Earlie Server, MD  sodium hypochlorite (DAKIN'S 1/4 STRENGTH) 0.125 % SOLN Irrigate with as directed every morning. Patient taking differently: Irrigate with 1 application as directed every morning.  11/21/19  Yes Georgette Shell, MD  umeclidinium-vilanterol [facility] ELLIPTA) 62.5-25 MCG/INH AEPB Inhale 1 puff into the lungs  daily. 08/01/19  Yes Juline Patch, MD  lidocaine-prilocaine (EMLA) cream Apply 1 application topically as needed.    [provider]  mupirocin ointment (BACTROBAN) 2 % Apply 1 application topically 2 (two) times daily. 10/10/19   Juline Patch, MD  nystatin cream (MYCOSTATIN) Apply 1 application topically 2 (two) times daily. 10/10/19   Earlie Server, MD  osimertinib mesylate (TAGRISSO) 80 MG tablet Take 1 tablet (80 mg total) by mouth daily. 12/14/19   Earlie Server, MD  oxyCODONE (OXY IR/ROXICODONE) 5 MG immediate release tablet Take 1 tablet (5 mg total) by mouth every 6 (six) hours as needed for severe pain. Patient not taking: Reported on 12/18/2019 10/19/19   Jacquelin Hawking, NP  VENTOLIN HFA 108 (90 Base) MCG/ACT inhaler Inhale 1-2 puffs into the lungs every 6 (six) hours as needed for wheezing or shortness of breath. 08/01/19   Juline Patch, MD  prochlorperazine (COMPAZINE) 10 MG tablet Take 1 tablet (10 mg total) by mouth every 6 (six) hours as needed (Nausea or vomiting). Patient not taking: Reported on 03/03/2019 03/01/18 04/25/19  Earlie Server, MD    Allergies Patient has no known allergies.  Family History  Problem Relation Age of Onset  . Colon cancer Mother 6  . Colon cancer Maternal Grandmother 78  . Hypertension Father   . Breast cancer Sister 29  . Throat cancer Brother   . Heart attack Maternal Aunt   . Ovarian cancer Maternal Aunt 70  . Stomach cancer Paternal Grandmother        dx >50  . Throat cancer Brother     Social History Social History   Tobacco Use  . Smoking status: Former Smoker    Packs/day: 1.00    Years: 25.00    Pack years: 25.00    Types: Cigarettes    Quit date: 11/19/2017    Years since quitting: 2.0  . Smokeless tobacco: Never Used  Vaping Use  . Vaping Use: Never used  Substance Use Topics  . Alcohol use: No  . Drug use: Yes    Types: Marijuana    Comment: occasional    Review of Systems  Constitutional: No fever/chills Eyes: No visual changes. ENT: No sore throat. Cardiovascular: Denies chest pain. Respiratory: Denies shortness of breath. Gastrointestinal: No abdominal pain.  No nausea, no vomiting.  No diarrhea.  No constipation. Genitourinary: Negative for dysuria. Musculoskeletal: Negative for back pain. Skin: Negative for rash.  Positive for displaced drain.  Positive for buttock wound. Neurological: Negative for headaches, focal weakness or numbness.  ____________________________________________   PHYSICAL EXAM:  VITAL SIGNS: ED Triage Vitals  Enc Vitals Group     BP      Pulse      Resp      Temp      Temp src      SpO2      Weight      Height      Head Circumference      Peak Flow      Pain Score      Pain Loc      Pain Edu?      Excl. in [place]?     Constitutional: Alert and oriented. Eyes: Conjunctivae are normal. Head: Atraumatic. Nose: No congestion/rhinnorhea. Mouth/Throat: Mucous membranes are moist. Neck: Normal ROM Cardiovascular: Normal rate, regular rhythm. Grossly normal heart sounds. Respiratory: Normal respiratory effort.   No retractions. Lungs CTAB. Gastrointestinal: Soft and nontender. No distention.  Colostomy in place.  Large healing midline incision with no erythema, warmth, or drainage noted.  Prior drain site to right lower quadrant also clean, dry, and intact. Genitourinary: deferred Musculoskeletal: No lower extremity tenderness nor edema. Neurologic:  Normal speech and language. No gross focal neurologic deficits are appreciated. Skin:  Skin is warm, dry and intact. No rash noted.  Drain site to right buttock draining frank pus with underlying area of fluctuance and surrounding erythema with warmth. Psychiatric: Mood and affect are normal. Speech and behavior are normal.  ____________________________________________   LABS (all labs ordered are listed, but only abnormal results are displayed)  Labs Reviewed  CBC WITH DIFFERENTIAL/PLATELET - Abnormal; Notable for the following components:      Result Value   RBC 2.94 (*)    Hemoglobin 9.0 (*)    HCT 28.4 (*)    RDW 16.3 (*)    nRBC 0.7 (*)    Abs Immature Granulocytes 0.14 (*)    All other components within normal limits  COMPREHENSIVE METABOLIC PANEL - Abnormal; Notable for the following components:   Glucose, Bld 137 (*)    Creatinine, Ser 0.41 (*)    Calcium 7.9 (*)    Total Protein 5.5 (*)    Albumin 2.3 (*)    All other components within normal limits  LACTIC ACID, PLASMA - Abnormal; Notable for the following components:   Lactic Acid, Venous 2.0 (*)    All other components within normal limits  SARS CORONAVIRUS 2 BY RT PCR (HOSPITAL ORDER, [place] [facility])  LACTIC ACID, PLASMA   ____________________________________________  EKG  ED ECG REPORT I, Blake Divine, the attending physician, personally viewed and interpreted this ECG.   Date: 12/18/2019  EKG Time: 16:46  Rate: 97  Rhythm: normal sinus rhythm  Axis: Normal  Intervals:none  ST&T Change: None   PROCEDURES  Procedure(s) performed  (including Critical Care):  Procedures   ____________________________________________   INITIAL IMPRESSION / ASSESSMENT AND PLAN / ED COURSE       64 year old female with history of recent perforated diverticulitis requiring Hartman's procedure, posterior pelvic abscess requiring drain, lung cancer metastatic to brain, hypertension, and hyperlipidemia who presents to the ED complaining of purulent drainage from her right buttock with recently displaced drain.  Her anterior abdominal wound appears to be healing reasonably well at this time and colostomy functioning appropriately.  Unfortunately, the wound to her right buttock is draining frank pus with evidence of associated cellulitis.  We will check labs and CT scan to assess for progression of prior fluid collection.  She has not been on antibiotics following her discharge.  Case discussed with Dr. Lysle Pearl of general surgery, who will evaluate the patient in the ED.  Lab work is reassuring, CT scan shows development of small fluid collections around the duodenum as well as increased size of pelvic fluid collection.  Dr. Lysle Pearl of general surgery recommends admission to medicine for IV antibiotics and potential replacement of IR drain.  We will start cefepime and vancomycin given patient grew out Pseudomonas in the past.  Case discussed with hospitalist for admission.  After further discussion, plan will now be for from admission to surgical service with medicine consult.      ____________________________________________   FINAL CLINICAL IMPRESSION(S) / ED DIAGNOSES  Final diagnoses:  Pelvic abscess in female  Cellulitis of buttock  Malignant neoplasm of lung, unspecified laterality, unspecified part of lung [facility])     ED Discharge Orders    None  Note:  This document was prepared using Dragon voice recognition software and may include unintentional dictation errors.   Blake Divine, MD 12/18/19 2037    Blake Divine, MD 12/18/19 2043

## 2019-12-18 NOTE — ED Notes (Signed)
Pt's son [name] called the ED for update, he was provided with room number on floor.  He stated he would notify his father [name] with that information as well.

## 2019-12-18 NOTE — ED Triage Notes (Signed)
Pt arrives from [facility] via [facility] with complaint of dislodged drains.  She states there were 2 post-procedural drains in her "butt", one dislodged from on Thursday, the other one a couple of weeks ago.  Dressing removed from abdomen.  Hx of perforated bowel, ostomy present.  Pt is alert and oriented x4.

## 2019-12-18 NOTE — ED Notes (Signed)
Critical lab value, lactic 2.0, Dr. Charna Archer notified, orders to follow
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
## 2019-12-19 LAB — CBC
HCT: 25.8 % — ABNORMAL LOW (ref 36.0–46.0)
Hemoglobin: 8.2 g/dL — ABNORMAL LOW (ref 12.0–15.0)
MCH: 31.1 pg (ref 26.0–34.0)
MCHC: 31.8 g/dL (ref 30.0–36.0)
MCV: 97.7 fL (ref 80.0–100.0)
Platelets: 180 10*3/uL (ref 150–400)
RBC: 2.64 MIL/uL — ABNORMAL LOW (ref 3.87–5.11)
RDW: 16.5 % — ABNORMAL HIGH (ref 11.5–15.5)
WBC: 7 10*3/uL (ref 4.0–10.5)
nRBC: 0.7 % — ABNORMAL HIGH (ref 0.0–0.2)

## 2019-12-19 LAB — MAGNESIUM: Magnesium: 1.5 mg/dL — ABNORMAL LOW (ref 1.7–2.4)

## 2019-12-19 LAB — BASIC METABOLIC PANEL
Anion gap: 3 — ABNORMAL LOW (ref 5–15)
BUN: 10 mg/dL (ref 8–23)
CO2: 29 mmol/L (ref 22–32)
Calcium: 7.5 mg/dL — ABNORMAL LOW (ref 8.9–10.3)
Chloride: 108 mmol/L (ref 98–111)
Creatinine, Ser: 0.36 mg/dL — ABNORMAL LOW (ref 0.44–1.00)
GFR calc Af Amer: >60 mL/min (ref >60)
GFR calc non Af Amer: >60 mL/min (ref >60)
Glucose, Bld: 89 mg/dL (ref 70–99)
Potassium: 3.2 mmol/L — ABNORMAL LOW (ref 3.5–5.1)
Sodium: 140 mmol/L (ref 135–145)

## 2019-12-19 LAB — PROTIME-INR
INR: 1 (ref 0.8–1.2)
Prothrombin Time: 12.5 seconds (ref 11.4–15.2)

## 2019-12-19 LAB — PHOSPHORUS: Phosphorus: 3.1 mg/dL (ref 2.5–4.6)

## 2019-12-19 MED ORDER — DAKINS (1/4 STRENGTH) 0.125 % EX SOLN
Freq: Two times a day (BID) | CUTANEOUS | Status: DC
  Filled 2019-12-19: qty 473

## 2019-12-19 MED ORDER — MAGNESIUM SULFATE 2 GM/50ML IV SOLN
2.0000 g | Freq: Once | INTRAVENOUS | Status: AC
  Administered 2019-12-19: 2 g via INTRAVENOUS
  Filled 2019-12-19: qty 50

## 2019-12-19 MED ORDER — CLOPIDOGREL BISULFATE 75 MG PO TABS
75.0000 mg | ORAL_TABLET | Freq: Every day | ORAL | Status: DC
  Administered 2019-12-19 – 2019-12-23 (×5): 75 mg via ORAL
  Filled 2019-12-19 (×5): qty 1

## 2019-12-19 MED ORDER — LACTATED RINGERS IV SOLN: INTRAVENOUS | Status: DC

## 2019-12-19 MED ORDER — ASPIRIN EC 81 MG PO TBEC
81.0000 mg | DELAYED_RELEASE_TABLET | Freq: Every day | ORAL | Status: DC
  Administered 2019-12-19 – 2019-12-23 (×5): 81 mg via ORAL
  Filled 2019-12-19 (×5): qty 1

## 2019-12-19 MED ORDER — CHLORHEXIDINE GLUCONATE CLOTH 2 % EX PADS
6.0000 | MEDICATED_PAD | Freq: Every day | CUTANEOUS | Status: DC
  Administered 2019-12-20 – 2019-12-23 (×4): 6 via TOPICAL

## 2019-12-19 MED ORDER — DAKINS (1/4 STRENGTH) 0.125 % EX SOLN
Freq: Every day | CUTANEOUS | Status: DC
  Filled 2019-12-19: qty 473

## 2019-12-19 MED ORDER — POTASSIUM CHLORIDE CRYS ER 20 MEQ PO TBCR
40.0000 meq | EXTENDED_RELEASE_TABLET | Freq: Once | ORAL | Status: AC
  Administered 2019-12-19: 40 meq via ORAL
  Filled 2019-12-19: qty 2

## 2019-12-19 NOTE — Progress Notes (Signed)
Dr Lysle Pearl was informed of the progress of the abdominal wound and bilateral feet wounds. He was updated that the right gluteal wound is draining green purulent drainage. He changed to the patient NPO and gave the following orders for wound care. Change dressing to bilateral feet and abdominal wound daily.  Use dakin's solution wet to dry and cover with ABD for abdominal wound. Use Santyl to bilateral feet and cover with vaseline gauze and ABD and wrap with kerlix. Right gluteal wound is draining to a urostomy bag. If the urostomy bag does not work than nursing should use a ABD dressing and change the gluteal dressing BID and as needed

## 2019-12-19 NOTE — Progress Notes (Signed)
UPDATE:  Discussed case with IR and they do not see any fluid collection that is drainage at this time.  Since patient is otherwise stable, will continue local wound care of all her wounds and continue f/u on outpt basis.  Pending final dispo after review by CM, due to patient insistence on not returning to [facility].

## 2019-12-19 NOTE — TOC Initial Note (Signed)
Transition of Care [facility]) - Initial/Assessment Note    Patient Details  Name: Kristina Wright MRN: [id number] Date of Birth: December 17, 1955  [facility] CM/SW Contact:    Candie Chroman, LCSW Phone Number: 12/19/2019, 3:08 PM  Clinical Narrative: CSW met with patient. No supports at bedside. CSW familiar with patient from last admission. Patient discharged to [facility] on 8/2 and does not want to return. Patient prefers to return home with home health and stated her ex-husband is willing to stay with her. Her neighbor that is a Marine scientist is also reportedly willing to assist as needed. Patient requesting a hospital bed and wheelchair for home use. Patient said her daughter would be the best person to talk to. Daughter expressed understanding. Received call from patient' son, Kristina Wright. He explained that they have had meetings with patient and [facility] staff about what patient would need to return home. [place] recommends 24/7 professional care. Patient's sister spoke with SNF social worker on day of admission to start Medicaid process in case she had to be there more than 60 days. Patient's son will come to hospital to talk with patient later today and follow up with CSW tomorrow. No further concerns. CSW encouraged patient and her children to contact CSW as needed. CSW will continue to follow patient and her family for support and facilitate return home vs. SNF when stable.  Expected Discharge Plan: [place] Barriers to Discharge: Insurance Authorization, Other (comment) (Patient doesn't want SNF. Family worried about her being at home.)   Patient Goals and CMS Choice        Expected Discharge Plan and Services Expected Discharge Plan: [place] Acute Care Choice: Home Health, Durable Medical Equipment Living arrangements for the past 2 months: [place], [place]                                       Prior Living Arrangements/Services Living arrangements for the past 2 months: [place], [place] Lives with:: Self Patient language and need for interpreter reviewed:: Yes Do you feel safe going back to the place where you live?: Yes      Need for Family Participation in Patient Care: Yes (Comment) Care giver support system in place?: Yes (comment) Current home services: DME Criminal Activity/Legal Involvement Pertinent to Current Situation/Hospitalization: No - Comment as needed  Activities of Daily Living Home Assistive Devices/Equipment: None ADL Screening (condition at time of admission) Patient's cognitive ability adequate to safely complete daily activities?: Yes Is the patient deaf or have difficulty hearing?: No Does the patient have difficulty seeing, even when wearing glasses/contacts?: No Does the patient have difficulty concentrating, remembering, or making decisions?: No Patient able to express need for assistance with ADLs?: Yes Does the patient have difficulty dressing or bathing?: Yes Independently performs ADLs?: No Communication: Independent Dressing (OT): Dependent Is this a change from baseline?: Pre-admission baseline Grooming: Dependent Is this a change from baseline?: Pre-admission baseline Feeding: Needs assistance Is this a change from baseline?: Pre-admission baseline Bathing: Dependent Is this a change from baseline?: Pre-admission baseline Toileting: Dependent Is this a change from baseline?: Pre-admission baseline In/Out Bed: Dependent Is this a change from baseline?: Pre-admission baseline Walks in Home: Dependent Is this a change from baseline?: Pre-admission baseline Does the patient have  difficulty walking or climbing stairs?: Yes Weakness of Legs: Both Weakness of Arms/Hands: Both  Permission Sought/Granted Permission sought to share information with : Facility Art therapist granted  to share information with : Yes, Verbal Permission Granted  Share Information with NAME: Jayne Peckenpaugh  Permission granted to share info w AGENCY: [place] granted to share info w Relationship: Daughter  Permission granted to share info w Contact Information: [phone]  Emotional Assessment Appearance:: Appears stated age Attitude/Demeanor/Rapport: Engaged, Gracious Affect (typically observed): Accepting, Appropriate, Calm, Pleasant Orientation: : Oriented to Self, Oriented to Place, Oriented to  Time, Oriented to Situation Alcohol / Substance Use: Not Applicable Psych Involvement: No (comment)  Admission diagnosis:  Cellulitis of buttock [L03.317] Intra-abdominal abscess (HCC) [K65.1] Pelvic abscess in female [N73.9] Malignant neoplasm of lung, unspecified laterality, unspecified part of lung ([place]) [C34.90] Patient Active Problem List   Diagnosis Date Noted  . Intra-abdominal abscess ([place]) 12/18/2019  . Current use of steroid medication 12/01/2019  . Abdominal fluid collection   . Ileus ([place])   . Immunosuppression due to chronic steroid use   . Palliative care by specialist   . Encounter for orogastric tube placement   . DNR (do not resuscitate)   . Brain edema ([place])   . Normocytic anemia   . Pressure injury of skin 11/02/2019  . Bowel perforation ([place]) 10/30/2019  . Bilateral lower extremity edema 10/07/2019  . Herpes zoster without complication 68/34/1962  . Endotracheal tube present 10/01/2019  . Brain metastasis ([place]) 08/05/2019  . Elevated TSH 06/20/2019  . Chronic obstructive pulmonary disease ([place]) 12/13/2018  . Left lower lobe pulmonary nodule 11/29/2018  . Breast nodule 08/25/2018  . Anxiety 08/22/2018  . Genetic testing 06/11/2018  . Family history of breast cancer   . Family history of colon cancer   . Family history of ovarian cancer   . Family history of stomach cancer   . Family history of throat cancer   . Metastatic  adenocarcinoma to lung with unknown primary site [facility]) 04/27/2018  . Depression 03/12/2018  . Malignant neoplasm of lung ([place]) 03/01/2018  . Goals of care, counseling/discussion 03/01/2018  . Tobacco use disorder 02/08/2016  . Essential hypertension 02/08/2016  . PVD (peripheral vascular disease) ([place]) 02/08/2016  . Blue toe syndrome of right lower extremity ([place]) 02/08/2016  . Adult BMI > 30 07/31/2015  . High risk medication use 07/31/2015  . Carpal tunnel syndrome on both sides 10/30/2014  . Postmenopause atrophic vaginitis 05/15/2014  . Sensory urge incontinence 05/15/2014  . Vitamin D deficiency 04/25/2014  . Extremity pain 11/22/2013  . Numbness 11/22/2013  . Sleep disorder 11/22/2013  . Chronic insomnia 11/10/2013  . Foot pain, left 11/10/2013  . Hypothyroidism 11/10/2013  . Mixed hyperlipidemia 11/10/2013   PCP:  Juline Patch, MD Pharmacy:   [place], [place] - [address] [place] [address] Phone: [phone] Fax: [phone]  [place], [place] [address] Phone: [phone] Fax: [phone]     Social Determinants of Health (SDOH) Interventions    Readmission Risk Interventions Readmission Risk Prevention Plan 11/04/2019  PCP or Specialist Appt within 3-5 Days Complete  HRI or [place] Complete  Social Work Consult for [place] Planning/Counseling Complete  Palliative Care Screening Not Applicable  Some recent data might be hidden

## 2019-12-19 NOTE — Evaluation (Signed)
Physical Therapy Evaluation Patient Details Name: Kristina Wright MRN: [id number] DOB: 1955/11/22 Today's Date: 12/19/2019   History of Present Illness  Per MD notes: Pt is a 64 y.o. female with past medical history of perforated diverticulitis status post Hartman's procedure, gluteal abscess status post drain, lung cancer metastatic to brain, hypertension, and hyperlipidemia who presents to the ED complaining of postop problem.  Patient had abdominal JP drain in place and states this was removed 2 weeks ago.  She additionally had a drain in place to her right buttock for posterior pelvic abscess.  She states this drain was displaced 2 days ago and since then has been draining pus.  Due to increased pus drainage, the doctor was contacted at [facility] and advised she be transported to the ED. She was treated for perforated diverticulitis with Hartman's procedure on July 11, has been residing at [facility] since then.  Per surgery note, "Discussed case with IR and they do not see any fluid collection that is drainage at this time.  Since patient is otherwise stable, will continue local wound care of all her wounds and continue f/u on outpt basis."    Clinical Impression  Pt anxious initially upon entering room with immediately suggesting that her participation with PT services would be limited.  With education on physiological benefits of activity and importance of activity on reaching her functional goals the pt was willing to participate fully during the session.  Overall pt was very weak functionally and required near total assist with bed mobility tasks and was unable to come to standing at the EOB even with heavy +2 assist.  Pt will benefit from PT services in a SNF setting upon discharge to safely address deficits listed in patient problem list for decreased caregiver assistance and eventual return to PLOF.      Follow Up Recommendations SNF    Equipment Recommendations  None recommended  by PT    Recommendations for Other Services       Precautions / Restrictions Precautions Precautions: Fall Restrictions Weight Bearing Restrictions: No Other Position/Activity Restrictions: Colostomy to LLQ      Mobility  Bed Mobility Overal bed mobility: Needs Assistance Bed Mobility: Supine to Sit;Sit to Supine     Supine to sit: +2 for physical assistance;Max assist Sit to supine: Max assist;+2 for physical assistance   General bed mobility comments: +2 max A for BLE and trunk control  Transfers                 General transfer comment: Unable; pt able to slightly unweight bottom from EOB but unable to clear surface of bed even with +2 max A with trials both with RW and with pt holding to sink counter  Ambulation/Gait             General Gait Details: Catering manager    Modified Rankin (Stroke Patients Only)       Balance Overall balance assessment: Needs assistance Sitting-balance support: Feet supported;Single extremity supported Sitting balance-Leahy Scale: Good                                       Pertinent Vitals/Pain Pain Assessment: 0-10 Pain Score: 3  Pain Location: abdomen Pain Descriptors / Indicators: Sore Pain Intervention(s): Premedicated before session;Monitored during session  Home Living Family/patient expects to be discharged to:: Private residence Living Arrangements: Spouse/significant other Available Help at Discharge: Available PRN/intermittently Type of Home: House Home Access: Stairs to enter Entrance Stairs-Rails: Left Entrance Stairs-Number of Steps: 4 Home Layout: One level Home Equipment: [place] - 2 wheels;Cane - single point;Bedside commode Additional Comments: Sister lives next door and can also assist    Prior Function Level of Independence: Independent         Comments: Prior to July of 2021 pt was Ind with amb community distances without an AD,  no fall history, worked for her church, and was Ind with ADLs; pt endorses steady decline in functional mobility over the last several weeks     Hand Dominance   Dominant Hand: Right    Extremity/Trunk Assessment   Upper Extremity Assessment Upper Extremity Assessment: Generalized weakness    Lower Extremity Assessment Lower Extremity Assessment: Generalized weakness       Communication   Communication: No difficulties  Cognition Arousal/Alertness: Awake/alert Behavior During Therapy: Anxious Overall Cognitive Status: No family/caregiver present to determine baseline cognitive functioning                                 General Comments: Pt anxious and required frequent encouragement to participate during the session      General Comments      Exercises Total Joint Exercises Ankle Circles/Pumps: AROM;Strengthening;Both;5 reps;10 reps Quad Sets: Strengthening;Both;10 reps;5 reps Gluteal Sets: Strengthening;Both;5 reps;10 reps Hip ABduction/ADduction: AAROM;Both;10 reps Straight Leg Raises: AAROM;Both;5 reps Long Arc Quad: AROM;Strengthening;Both;10 reps Knee Flexion: AROM;Strengthening;Both;10 reps Other Exercises Other Exercises: HEP education for BLE APs, QS, and GS x 10 each every 1-2 hours daily   Assessment/Plan    PT Assessment Patient needs continued PT services  PT Problem List Decreased activity tolerance;Decreased balance;Decreased mobility;Decreased strength;Decreased knowledge of use of DME;Decreased safety awareness;Pain       PT Treatment Interventions DME instruction;Gait training;Stair training;Functional mobility training;Therapeutic activities;Therapeutic exercise;Balance training;Patient/family education    PT Goals (Current goals can be found in the Care Plan section)  Acute Rehab PT Goals Patient Stated Goal: To walk and go home PT Goal Formulation: With patient Time For Goal Achievement: 01/01/20 Potential to Achieve Goals:  Fair    Frequency Min 2X/week   Barriers to discharge Inaccessible home environment;Decreased caregiver support      Co-evaluation               AM-PAC PT "6 Clicks" Mobility  Outcome Measure Help needed turning from your back to your side while in a flat bed without using bedrails?: Total Help needed moving from lying on your back to sitting on the side of a flat bed without using bedrails?: Total Help needed moving to and from a bed to a chair (including a wheelchair)?: Total Help needed standing up from a chair using your arms (e.g., wheelchair or bedside chair)?: Total Help needed to walk in hospital room?: Total Help needed climbing 3-5 steps with a railing? : Total 6 Click Score: 6    End of Session Equipment Utilized During Treatment: Gait belt Activity Tolerance: Patient tolerated treatment well Patient left: in bed;with call bell/phone within reach;with bed alarm set Nurse Communication: Mobility status PT Visit Diagnosis: Difficulty in walking, not elsewhere classified (R26.2);Muscle weakness (generalized) (M62.81);Pain Pain - Right/Left:  (abdomen) Pain - part of body:  (abdomen)    Time: 1430-1505 PT Time Calculation (min) (ACUTE ONLY): 35 min  Charges:   PT Evaluation $PT Eval Moderate Complexity: 1 Mod PT Treatments $Therapeutic Exercise: 8-22 mins        D. Royetta Asal PT, DPT 12/19/19, 5:24 PM

## 2019-12-19 NOTE — Progress Notes (Addendum)
Dr Lysle Pearl notified of green purulent drainage coming from the patients right buttock. Order received to change dressing to abdomen and bilateral feet daily and right buttock bid

## 2019-12-20 ENCOUNTER — Inpatient Hospital Stay: Payer: 59 | Admitting: Oncology

## 2019-12-20 ENCOUNTER — Inpatient Hospital Stay: Payer: 59

## 2019-12-20 LAB — MAGNESIUM: Magnesium: 2.6 mg/dL — ABNORMAL HIGH (ref 1.7–2.4)

## 2019-12-20 NOTE — TOC Progression Note (Addendum)
Transition of Care [facility]) - Progression Note    Patient Details  Name: Kristina Wright MRN: [id number] Date of Birth: 10-17-1955  Transition of Care [facility]) CM/SW [place], LCSW Phone Number: 12/20/2019, 3:50 PM  Clinical Narrative: Patient still refusing SNF. CSW called and updated daughter.    4:19 pm: Notified daughter that MD is hoping to discharge patient in the next day or so. Asked MD to go ahead and enter home health and DME orders for hospital bed and hoyer lift. Left message for [employer] representative to see which wheelchair we would need to order.  Expected Discharge Plan: [place] Barriers to Discharge: Insurance Authorization, Other (comment) (Patient doesn't want SNF. Family worried about her being at home.)  Expected Discharge Plan and Services Expected Discharge Plan: [place] Choice: Home Health, Durable Medical Equipment Living arrangements for the past 2 months: [place], [place]                                       Social Determinants of Health (SDOH) Interventions    Readmission Risk Interventions Readmission Risk Prevention Plan 11/04/2019  PCP or Specialist Appt within 3-5 Days Complete  HRI or [place] Complete  Social Work Consult for [place] Planning/Counseling Complete  Palliative Care Screening Not Applicable  Some recent data might be hidden

## 2019-12-20 NOTE — Progress Notes (Signed)
Physical Therapy Treatment Patient Details Name: Kristina Wright MRN: [id number] DOB: 23-Apr-1955 Today's Date: 12/20/2019    History of Present Illness Per MD notes: Pt is a 64 y.o. female with past medical history of perforated diverticulitis status post Hartman's procedure, gluteal abscess status post drain, lung cancer metastatic to brain, hypertension, and hyperlipidemia who presents to the ED complaining of postop problem.  Patient had abdominal JP drain in place and states this was removed 2 weeks ago.  She additionally had a drain in place to her right buttock for posterior pelvic abscess.  She states this drain was displaced 2 days ago and since then has been draining pus.  Due to increased pus drainage, the doctor was contacted at [facility] and advised she be transported to the ED. She was treated for perforated diverticulitis with Hartman's procedure on July 11, has been residing at [facility] since then.  Per surgery note, "Discussed case with IR and they do not see any fluid collection that is drainage at this time.  Since patient is otherwise stable, will continue local wound care of all her wounds and continue f/u on outpt basis."    PT Comments    Pt was pleasant and motivated to participate during the session and put forth good effort throughout.  Pt with much improved optimism and motivation compared to previous session.  Pt found with new drain to R buttocks area with nursing requesting bed therex only and no bed mobility to protect the integrity of the drain.  Pt reported no adverse symptoms during the session with SpO2 and HR WNL.  Pt will benefit from PT services in a SNF setting upon discharge to safely address deficits listed in patient problem list for decreased caregiver assistance and eventual return to PLOF.       Follow Up Recommendations  SNF     Equipment Recommendations  None recommended by PT    Recommendations for Other Services       Precautions /  Restrictions Precautions Precautions: Fall Restrictions Weight Bearing Restrictions: No Other Position/Activity Restrictions: Colostomy to LLQ, drain to R buttocks    Mobility  Bed Mobility               General bed mobility comments: NT per nursing request to protect integrity of new R buttocks drain; supine therex only  Transfers                    Ambulation/Gait                 Stairs             Wheelchair Mobility    Modified Rankin (Stroke Patients Only)       Balance                                            Cognition Arousal/Alertness: Awake/alert Behavior During Therapy: WFL for tasks assessed/performed Overall Cognitive Status: Within Functional Limits for tasks assessed                                        Exercises Total Joint Exercises Ankle Circles/Pumps: AROM;Strengthening;Both;10 reps;15 reps (with manual resistance) Quad Sets: Strengthening;Both;10 reps;15 reps Gluteal Sets: Strengthening;Both;10 reps;15 reps Heel Slides: AAROM;Both;10 reps  Hip ABduction/ADduction: AAROM;Both;10 reps Straight Leg Raises: AAROM;Both;10 reps Other Exercises Other Exercises: Supine leg press 2 x 10 to BLEs with manual resistance Other Exercises: HEP education and review for BLE APs, QS, GS, and LAQs (when able to be in sitting) x 10 5-6x/day    General Comments        Pertinent Vitals/Pain Pain Assessment: 0-10 Pain Score: 3  Pain Location: HA Pain Descriptors / Indicators: Sore Pain Intervention(s): Premedicated before session;Monitored during session    Home Living                      Prior Function            PT Goals (current goals can now be found in the care plan section) Progress towards PT goals: Progressing toward goals    Frequency    Min 2X/week      PT Plan Current plan remains appropriate    Co-evaluation              AM-PAC PT "6 Clicks"  Mobility   Outcome Measure  Help needed turning from your back to your side while in a flat bed without using bedrails?: Total Help needed moving from lying on your back to sitting on the side of a flat bed without using bedrails?: Total Help needed moving to and from a bed to a chair (including a wheelchair)?: Total Help needed standing up from a chair using your arms (e.g., wheelchair or bedside chair)?: Total Help needed to walk in hospital room?: Total Help needed climbing 3-5 steps with a railing? : Total 6 Click Score: 6    End of Session   Activity Tolerance: Patient tolerated treatment well Patient left: in bed;with call bell/phone within reach;with bed alarm set Nurse Communication: Mobility status PT Visit Diagnosis: Difficulty in walking, not elsewhere classified (R26.2);Muscle weakness (generalized) (M62.81);Pain     Time: 0102-7253 PT Time Calculation (min) (ACUTE ONLY): 25 min  Charges:  $Therapeutic Exercise: 23-37 mins                     D. Scott Jonna Dittrich PT, DPT 12/20/19, 3:49 PM

## 2019-12-21 MED ORDER — ENOXAPARIN SODIUM 40 MG/0.4ML ~~LOC~~ SOLN
40.0000 mg | SUBCUTANEOUS | Status: DC
  Administered 2019-12-21 – 2019-12-22 (×2): 40 mg via SUBCUTANEOUS
  Filled 2019-12-21 (×2): qty 0.4

## 2019-12-21 NOTE — TOC Progression Note (Addendum)
Transition of Care [facility]) - Progression Note    Patient Details  Name: Kristina Wright MRN: [id number] Date of Birth: 24-May-1955  Transition of Care [facility]) CM/SW [place], LCSW Phone Number: 12/21/2019, 10:05 AM  Clinical Narrative: CSW notified [place] [facility] [place] of plan to discharge with a wound vac. Per Dr. Lysle Pearl, patient still refusing SNF.    10:50 am: CSW met with patient. She feels comfortable going home with wound vac. CSW left daughter a Advertising account executive.  12:59 pm: CSW called son [name]. Provided update. He is still very concerned about patient returning home with lack of caregiver support. He is coming to hospital to talk to her again. He stated patient has a capacity evaluation at [facility] last week and they said she does have capacity.  2:35 pm: Had meeting with patient and her son to discuss concerns. Patient still wants to go home.  Expected Discharge Plan: [place] Barriers to Discharge: Insurance Authorization, Other (comment) (Patient doesn't want SNF. Family worried about her being at home.)  Expected Discharge Plan and Services Expected Discharge Plan: [place] Choice: Home Health, Durable Medical Equipment Living arrangements for the past 2 months: [place], [place]                                       Social Determinants of Health (SDOH) Interventions    Readmission Risk Interventions Readmission Risk Prevention Plan 11/04/2019  PCP or Specialist Appt within 3-5 Days Complete  HRI or [place] Complete  Social Work Consult for [place] Planning/Counseling Complete  Palliative Care Screening Not Applicable  Some recent data might be hidden

## 2019-12-21 NOTE — Progress Notes (Signed)
Subjective:  CC: Kristina Wright is a 64 y.o. female  Hospital stay day 3,   intra-abdominal abscess  HPI: Report of green discharge yesterday, change from brown purulent discharge on admission.  Soaking through dressings, so switched to urostomy bag.     ROS:  General: Denies weight loss, weight gain, fatigue, fevers, chills, and night sweats. Heart: Denies chest pain, palpitations, racing heart, irregular heartbeat, leg pain or swelling, and decreased activity tolerance. Respiratory: Denies breathing difficulty, shortness of breath, wheezing, cough, and sputum. GI: Denies change in appetite, heartburn, nausea, vomiting, constipation, diarrhea, and blood in stool. GU: Denies difficulty urinating, pain with urinating, urgency, frequency, blood in urine.   Objective:   Temp:  [98 F (36.7 C)-99 F (37.2 C)] 98.3 F (36.8 C) (09/01 0538) Pulse Rate:  [77-90] 82 (09/01 0538) Resp:  [18-20] 20 (09/01 0538) BP: (135-151)/(67-86) 151/86 (09/01 0538) SpO2:  [96 %-98 %] 96 % (09/01 0538)     Height: 5\' 9"  (175.3 cm) Weight: 92.5 kg BMI (Calculated): 30.11   Intake/Output this shift:   Intake/Output Summary (Last 24 hours) at 12/21/2019 0935 Last data filed at 12/21/2019 0150 Gross per 24 hour  Intake 0 ml  Output 1400 ml  Net -1400 ml    Constitutional :  alert, cooperative, appears stated age and no distress  Respiratory:  clear to auscultation bilaterally  Cardiovascular:  regular rate and rhythm  Gastrointestinal: soft, non-tender; bowel sounds normal; no masses,  no organomegaly. Midline abdominal wound with healthy granulation tissue at base.  Continues to be very healthy, with minimal discharge.  Total wound measurement at 20cm x 22cm x 9.3cm deep at longest, widest, deepest points.   Skin: Cool and moist. Left gluteal wound with urostomy bag in place, currently has scant green, purulent discharge. No expanding erythema, induration, TTP around drainage site.  Psychiatric: Normal  affect, non-agitated, not confused       LABS:  CMP Latest Ref Rng & Units 12/19/2019 12/18/2019 12/01/2019  Glucose 70 - 99 mg/dL 89 137(H) 112(H)  BUN 8 - 23 mg/dL 10 14 15   Creatinine 0.44 - 1.00 mg/dL 0.36(L) 0.41(L) 0.42(L)  Sodium 135 - 145 mmol/L 140 140 134(L)  Potassium 3.5 - 5.1 mmol/L 3.2(L) 3.6 3.6  Chloride 98 - 111 mmol/L 108 106 99  CO2 22 - 32 mmol/L 29 25 24   Calcium 8.9 - 10.3 mg/dL 7.5(L) 7.9(L) 8.1(L)  Total Protein 6.5 - 8.1 g/dL - 5.5(L) 6.3(L)  Total Bilirubin 0.3 - 1.2 mg/dL - 0.5 0.4  Alkaline Phos 38 - 126 U/L - 74 81  AST 15 - 41 U/L - 23 25  ALT 0 - 44 U/L - 33 45(H)   CBC Latest Ref Rng & Units 12/19/2019 12/18/2019 12/01/2019  WBC 4.0 - 10.5 K/uL 7.0 7.6 9.4  Hemoglobin 12.0 - 15.0 g/dL 8.2(L) 9.0(L) 10.3(L)  Hematocrit 36 - 46 % 25.8(L) 28.4(L) 31.6(L)  Platelets 150 - 400 K/uL 180 184 282    RADS: n/a Assessment:   S/p hartman's for perforated diverticulitis, subsequent midline wound dehiscence and intra-abdominal abscess, s/p drain placement by IR.    She is also undergoing treatement for stage IV lung CA.  To recap: Per patient report, IR drain through gluteal wound was pulled last week, and no further f/u was pursued by NH.  The area then started draining purulent material few days after it was pulled, which led to transfer to hospital.  CT showed new fluid collections in abdomen.  The output amount reported to be fairly moderate through her hospital stay, despite the CT showing no obvious fluid collection at the subq level upon admission.  The output also started changing to more green/bilious color.  This persistent output and change in color made me concerned for possible fistula formation with small bowel adjacent to previous area of the fluid collection and drain placement noted in pelvis.  CT image review of myself also makes me consider this as differential diagnosis.  Output seems to be slowing down after placing her on NPO status past  24hrs.  Plan is to continue NPO for most of day today, and continue to monitor output.  If it seems to be slowing down, may consider resuming clears to see if any changes.  Vitals remain stable, patient has no other complaints, so I do not believe we need to resume abx, or any other intervention except for IVF during NPO status.  In the meantime, discussed how medical team still considers SNF to be best dispo for her due to her debilitated status.  She still adamantly refusing and states she wishes to go home, understanding potential risk including further decline in health, due to lack of available resources to provide care at home.  Therefore, [facility] services and equipment will be provided as much as possible to minimize issues.    Part of preparation to [facility] will include placement of wound vac to midline abdominal wound to minimize need for dressing changes.  Will apply white dressing to base before black sponge placement due to extremely thin fascia overlying bowel.  Planned dressing changes every MWF.  The above plans have been discussed with patient, CSW, and Therapist, sports.  All are in agreement for now.  Will continue to monitor her during her hospital stay.

## 2019-12-22 ENCOUNTER — Ambulatory Visit: Payer: 59

## 2019-12-22 NOTE — Progress Notes (Signed)
Physical Therapy Treatment Patient Details Name: Kristina Wright MRN: [id number] DOB: [birth year] Today's Date: 12/22/2019    History of Present Illness Per MD notes: Pt is a 64 y.o. female with past medical history of perforated diverticulitis status post Hartman's procedure, gluteal abscess status post drain, lung cancer metastatic to brain, hypertension, and hyperlipidemia who presents to the ED complaining of postop problem.  Patient had abdominal JP drain in place and states this was removed 2 weeks ago.  She additionally had a drain in place to her right buttock for posterior pelvic abscess.  She states this drain was displaced 2 days ago and since then has been draining pus.  Due to increased pus drainage, the doctor was contacted at [facility] and advised she be transported to the ED. She was treated for perforated diverticulitis with Hartman's procedure on July 11, has been residing at [facility] since then.  Per surgery note, "Discussed case with IR and they do not see any fluid collection that is drainage at this time.  Since patient is otherwise stable, will continue local wound care of all her wounds and continue f/u on outpt basis."    PT Comments    Pt put forth good effort with bed mobility training and therex but declined to attempt to stand this session secondary to "having a really bad day" regarding dealing with family.  Pt educated on physiological benefits of weight bearing through the LE's but pt again declined and stated she would only do seated/supine therex this session.  Pt tolerated therex well.  At end of session some bleeding noted coming through bandage on left foot, nursing notified.  Pt will benefit from PT services in a SNF setting upon discharge to safely address deficits listed in patient problem list for decreased caregiver assistance and eventual return to PLOF.    Follow Up Recommendations  SNF     Equipment Recommendations  None recommended by PT     Recommendations for Other Services       Precautions / Restrictions Precautions Precautions: Fall Restrictions Weight Bearing Restrictions: No Other Position/Activity Restrictions: Colostomy to LLQ, drain to R buttocks    Mobility  Bed Mobility Overal bed mobility: Needs Assistance Bed Mobility: Supine to Sit;Sit to Supine     Supine to sit: +2 for physical assistance;Max assist;Mod assist Sit to supine: Max assist;+2 for physical assistance;Mod assist   General bed mobility comments: +2 Mod-max A for BLE and trunk control  Transfers                 General transfer comment: Pt declined to attempt  Ambulation/Gait                 Stairs             Wheelchair Mobility    Modified Rankin (Stroke Patients Only)       Balance Overall balance assessment: Needs assistance Sitting-balance support: Single extremity supported Sitting balance-Leahy Scale: Good                                      Cognition Arousal/Alertness: Awake/alert Behavior During Therapy: Anxious Overall Cognitive Status: Within Functional Limits for tasks assessed                                 General Comments: Pt  anxious at times during the session      Exercises Total Joint Exercises Ankle Circles/Pumps: AROM;Strengthening;Both;10 reps;15 reps Quad Sets: Strengthening;Both;10 reps;15 reps Gluteal Sets: Strengthening;Both;10 reps;15 reps Hip ABduction/ADduction: AAROM;Both;10 reps Straight Leg Raises: AAROM;Both;10 reps Long Arc Quad: AROM;Strengthening;Both;10 reps;15 reps Knee Flexion: AROM;Strengthening;Both;10 reps;15 reps Other Exercises Other Exercises: Static sitting at EOB x 10 min for improved activity tolerance Other Exercises: HEP education and review for BLE APs, QS, GS, and LAQs x 10 5-6x/day    General Comments        Pertinent Vitals/Pain Pain Assessment: 0-10 Pain Score: 5  Pain Location: HA Pain Descriptors /  Indicators: Aching Pain Intervention(s): Premedicated before session;Monitored during session    Home Living                      Prior Function            PT Goals (current goals can now be found in the care plan section) Progress towards PT goals: Not progressing toward goals - comment (Pt declined transfer attempt this session)    Frequency    Min 2X/week      PT Plan Current plan remains appropriate    Co-evaluation              AM-PAC PT "6 Clicks" Mobility   Outcome Measure  Help needed turning from your back to your side while in a flat bed without using bedrails?: Total Help needed moving from lying on your back to sitting on the side of a flat bed without using bedrails?: Total Help needed moving to and from a bed to a chair (including a wheelchair)?: Total Help needed standing up from a chair using your arms (e.g., wheelchair or bedside chair)?: Total Help needed to walk in hospital room?: Total Help needed climbing 3-5 steps with a railing? : Total 6 Click Score: 6    End of Session   Activity Tolerance: Patient tolerated treatment well Patient left: in bed;with call bell/phone within reach;with bed alarm set Nurse Communication: Mobility status;Other (comment) (IV alarm, bleeding from L foot through bandages) PT Visit Diagnosis: Difficulty in walking, not elsewhere classified (R26.2);Muscle weakness (generalized) (M62.81);Pain     Time: 1643-1710 PT Time Calculation (min) (ACUTE ONLY): 27 min  Charges:  $Therapeutic Exercise: 8-22 mins $Therapeutic Activity: 8-22 mins                     D. Scott Syerra Abdelrahman PT, DPT 12/22/19, 5:23 PM

## 2019-12-22 NOTE — Progress Notes (Signed)
Minimal, purulent, green output noted on ostomy bag over previous R flank drain site- not enough to empty out and measure.

## 2019-12-22 NOTE — Clinical Social Work Note (Signed)
Patient has an intra-abdominal abcess and a gluteal abcess which requires her body to be positioned in ways not feasible with a normal bed. Head must be elevated at least 30 degrees or more or pain occurs.  Dayton Scrape, [place]

## 2019-12-22 NOTE — TOC Progression Note (Addendum)
Transition of Care [facility]) - Progression Note    Patient Details  Name: Kristina Wright MRN: [id number] Date of Birth: 11-Jun-1955  Transition of Care [facility]) CM/SW [place], LCSW Phone Number: 12/22/2019, 10:07 AM  Clinical Narrative:  Patient took last night to think and pray about home vs. SNF and she still wants to go home. She has been in contact with family and friends about assisting/visiting with her as needed.   10:38 am: CSW spoke with daughter regarding plan for discharge home tomorrow and DME delivery to the home. CSW has updated [facility] representative regarding wound care needs. Notified [place] and [employer] of discharge so they can order the DME. [facility] is ordering a hospital bed, hoyer lift, and wound vac. [employer] is ordering a wheelchair. Patient is aware of nationwide shortage for wheelchairs and that they are on back order for one week. She thinks someone she knows has a wheelchair she can borrow if needed.  12:07 pm: Received voicemail from patient's sister Kristina Wright stating that no one was going to come help her due to her "bad decision." CSW called patient's daughter who confirmed they will be helping her as much as they are able. CSW will call daughter tomorrow to coordinate time for EMS transport so that someone can let her in the house. Per [employer], DME will more than likely be delivered tomorrow rather than today.  2:52 pm: Nurse asked me to reach out to patient. CSW called patient in the room. She is now saying that all of her family is refusing to help her. Patient still wants to return home. She will not allow CSW to call anyone in her family to follow up.  Expected Discharge Plan: [place] Barriers to Discharge: Insurance Authorization, Other (comment) (Patient doesn't want SNF. Family worried about her being at home.)  Expected Discharge Plan and Services Expected Discharge Plan: [place]  Choice: Home Health, Durable Medical Equipment Living arrangements for the past 2 months: [place], [place]                                       Social Determinants of Health (SDOH) Interventions    Readmission Risk Interventions Readmission Risk Prevention Plan 11/04/2019  PCP or Specialist Appt within 3-5 Days Complete  HRI or [place] Complete  Social Work Consult for [place] Planning/Counseling Complete  Palliative Care Screening Not Applicable  Some recent data might be hidden

## 2019-12-23 NOTE — Progress Notes (Addendum)
Patient and neighbor nurse given discharge information. 1-1 edu on bilateral wound changes to family friend that will do wound changes. Notified bilateral wound changes done daily as well as rt gluteal dressing. Take home wound vac edu given also. Patient stated home delivery of material will be between 9pm and 10 pm today.    Clarified with MD setting 125 for home wound vac is fine.

## 2019-12-23 NOTE — Progress Notes (Signed)
[facility] hospital liaison note:  New referral for [employer] community Palliative program to follow at home received from [facility]. Patient information given to referral. Plan si for discharge today. Thank you.  Flo Shanks BSN, RN, [place] [phone]

## 2019-12-23 NOTE — TOC Transition Note (Signed)
Transition of Care [facility]) - CM/SW Discharge Note   Patient Details  Name: Kristina Wright MRN: [id number] Date of Birth: Mar 10, 1956  Transition of Care [facility]) CM/SW Contact:  Candie Chroman, LCSW Phone Number: 12/23/2019, 4:53 PM   Clinical Narrative:  Patient has orders to discharge home today. DME will be delivered around 9:00. Son [name] will give his brother the unit phone number to call when it arrives. Nurse will call transport when DME has been delivered. Address is on facesheet in discharge packet. Nurse will send patient home with colostomy and wound care supplies. Patient will have to call [employer] a week before she needs new supplies. RN will make patient aware. No further concerns. CSW signing off.  Final next level of care: Home/Self Care Barriers to Discharge: Barriers Resolved   Patient Goals and CMS Choice        Discharge Placement                Patient to be transferred to facility by: EMS Name of family member notified: Manuela Schwartz and Irena Cords Patient and family notified of of transfer: 12/23/19  Discharge Plan and Services     Post Acute Care Choice: [place], [place]          DME Arranged: Hospital bed, Other see comment, Lightweight manual wheelchair with seat cushion, Negative pressure wound device (Hoyer lift) DME Agency: [facility], Other - Comment [facility]) Date DME Agency Contacted: 12/23/19   Representative spoke with at DME Agency: Andree Coss - [facility], [place]            Social Determinants of Health ([place]) Interventions     Readmission Risk Interventions Readmission Risk Prevention Plan 11/04/2019  PCP or Specialist Appt within 3-5 Days Complete  HRI or [place] Complete  Social Work Consult for [place] Planning/Counseling Complete  Palliative Care Screening Not Applicable  Some recent data might be hidden

## 2019-12-23 NOTE — Discharge Instructions (Signed)
Keep gluteal wound covered with gauze.  Change dressing at least daily, and as needed if there is a increase in output again.  Abdominal wound vac needs to be changed Monday, Wednesday, Friday.  Place white sponge at base of wound, then apply black sponge and secure with air tight dressing, then re attach the vac at 155mm Hg.  IT IS VERY IMPORTANT THE WHITE SPONGE IS PLACED BETWEEN THE WOUND BASE AND THE BLACK SPONGE.  This will prevent injury to the bowels underlying the thin wound base.  Continue dressing changes to bilateral feet as previously discussed.  Keeping your followup appointment is critical in maintaining current health.  Financially, you may have to pay out of pocket for wound vac supplies if you cannot keep weekly appointments with the surgeon

## 2019-12-23 NOTE — TOC Progression Note (Addendum)
Transition of Care [facility]) - Progression Note    Patient Details  Name: Kristina Wright MRN: [id number] Date of Birth: [birth year]  Transition of Care [facility]) CM/SW [place], LCSW Phone Number: 12/23/2019, 9:47 AM  Clinical Narrative:  [facility] is now unable to take patient for home health due to high risk of readmission to the hospital. Patient and her daughter are aware. CSW called around to other agencies. [facility], [place], [place], [place], [facility], [facility], [facility], [facility], [place], [facility], and [facility] unable to accept. [facility] representative has left a voicemail for the branch director to see if they would reconsider.  10:46 am: [facility] is still not willing to accept patient. Per MD, patient will need wound vac for 6 weeks. Patient said her neighbor that is a nurse may be willing to do the MWF dressing changes. [facility] representative is checking to see if they can even provide the vac without a home health agency on board.  12:17 pm: [facility] can still provide a wound vac but insurance will only pay for it if a nurse or MD is sending weekly notes. CSW asked patient to scoot over one inch in her bed on her own which she was unable to do. MD will discuss issues with patient. If she is unable to come see him weekly in the office he will discontinue the wound vac.  12:49 pm: Sent secure chat to [facility] nurse to find out if they can assist with colostomy supplies since we don't have [facility] now to order them for her. Left voicemail for patient's neighbor/nurse to see if she would be willing to do MWF vac changes vs. Whatever other dressing changes she may need if MD decides to discontinue her wound vac.   1:50 pm: CSW spoke to patient's neighbor that is a Marine scientist. Explained current situation. She is willing to help patient as much as she is able and willing to do wound vac changes MWF but explained it would be during the evening after work. Patient is aware and agreeable. Patient's  neighbor stated she has been in contact with patient's sister Kristina Wright that also lives next door and she is willing to sit with her during the day.  2:16 pm: Spoke to Mickel Baas with [employer] regarding colostomy supplies. She provided CSW with phone number for patient to call and provided needed information to get set up with a provider. Patient will call as soon as she can. We can send her home with some colostomy supplies and [employer] can send some by [employer] to get her through the 10-14 day time period until her provider can start shipping regularly. Patient said someone through [facility] just called and said they will deliver the DME at 9:00 tonight. CSW left message for [facility] representative to confirm.  2:31 pm: [facility] representative confirmed that it probably will be 9:00 tonight before equipment is delivered due to COVID/oxygen needs they have had an influx of lately. CSW called patient's daughter who is understandably upset that patient is discharging without any home health services. CSW explained that patient is still adamant about returning home today. Patient stated when the [employer] representative called her they told her they could deliver everything Monday if 9:00 was too late but patient stated 9:00 would be fine because she wants to leave today.  2:52 pm: Received call from son. Provided update. Discussed APS report. CSW left voicemail for DSS. Will make report when they call back. CSW had made patient aware of need for report  earlier today.  3:20 pm: Received call back from DSS. APS has been made. CSW left voicemail for [facility] admissions coordinator to see what process would be to get her back there if being at home does not work out, per son's request.  Expected Discharge Plan: [place] Services Barriers to Discharge: Insurance Authorization, Other (comment) (Patient doesn't want SNF. Family worried about her being at home.)  Expected Discharge Plan and Services Expected  Discharge Plan: [place] Choice: Home Health, Durable Medical Equipment Living arrangements for the past 2 months: [place], [place]                                       Social Determinants of Health (SDOH) Interventions    Readmission Risk Interventions Readmission Risk Prevention Plan 11/04/2019  PCP or Specialist Appt within 3-5 Days Complete  HRI or [place] Complete  Social Work Consult for [place] Planning/Counseling Complete  Palliative Care Screening Not Applicable  Some recent data might be hidden

## 2019-12-23 NOTE — Discharge Summary (Addendum)
Physician Discharge Summary  Patient ID: Kristina Wright MRN: [id number] DOB/AGE: 1955-08-21 64 y.o.  Admit date: 12/18/2019 Discharge date: 12/23/19  Admission Diagnoses: intra-abdominal abscess  Discharge Diagnoses:  Same as above,  Plus gluteal wound, bilateral feet wound, s/p hartman's, stage IV lung CA  Discharged Condition: stable  Hospital Course: S/p hartman's for perforated diverticulitis, subsequent midline wound dehiscence and intra-abdominal abscess, s/p drain placement by IR.    She is also undergoing treatement for stage IV lung CA.  To recap: Per patient report, IR drain through gluteal wound was pulled last week, and no further f/u was pursued by NH.  The area then started draining purulent material few days after it was pulled, which led to transfer to hospital.  CT showed new fluid collections in abdomen.   These were not amenale to additional drain placements after discussion with IR.  The output amount reported to be fairly moderate through beginning of her hospital stay, despite the CT showing no obvious fluid collection at the subq level upon admission.  The output also started changing to more green/bilious color.  This persistent output and change in color concerning for possible fistula formation with small bowel adjacent to previous area of the fluid collection and drain placement noted in pelvis.  CT image review by surgeon and agree with possible fistula as differential diagnosis for her presentation.  Output from wound did eventually stop, and diet was able to be resumed after trial of NPO. Vitals remained stable throughout her stay, patient had no other complaints, so no other intervention required during hospital stay except for initial abx and IVF during NPO status.  The gluteal wound now only needs to be covered with abd pad and monitored as outpt basis.  Midline wound from previous admission also remained healthy, switched to wound vac from daily wet to dry to  minimize number of dressing changes and facilitate quicker healing.  In the meantime, discussed how medical team still considers SNF to be best dispo for her due to her debilitated status.  She still adamantly refusing and states she wishes to go home, understanding potential risk including further decline in health, due to lack of available resources to provide care at home. Therefore, [facility] services and equipment will be provided as much as possible to minimize issues.   Unfortunately, [facility] services in the area deemed her too high risk to provide any care at home, so we were unable to arrange any care.  Hospital bed and wheel chair, along with wound vac supplies provided.  Surgeon specifically discussed with patient again about how she will have to arrange care for herself, both logistically and financially, in order to arrange assistance at home.  Pt repeated multiple times throughout our visits that she has neighbors and her husband that will be available to care for her, despite concerns voiced by her son on previous visits.  She stated that she has means to financially support the additional care and equipment needed, even asking if this most recent hospitalization will be covered or not. Surgeon stated he does not know what her insurance coverage plan entails, so could not offer her anymore information regarding her hospital bills.  Surgeon confirmed with her yet again, if she is comfortable being discharged home with no help from any home health agencies, and she specifically verbalized yes and is aware of possible consequences including further decline in health.    Dressing change instructions provided in discharge information for patient.  She will f/u  with surgeon on weekly basis for wound check, understanding the wound vac may become self-pay if she misses these appointments.  She again verbalized understanding.  The above plans have been discussed with patient, CSW, and Therapist, sports.  All are in agreement  for now.    Consults: None  Discharge Exam: Blood pressure (!) 163/85, pulse (!) 110, temperature 98.4 F (36.9 C), temperature source Oral, resp. rate 20, height 5\' 9"  (1.753 m), weight 92.5 kg, SpO2 97 %. General appearance: alert, cooperative, no distress and coherent and calm GI: soft, non-tender; bowel sounds normal; no masses,  no organomegaly Skin: midline wound vac intact with minimal serosanguinous output.  gluteal wound with minimal drainage, excoriation noted around site, but otherwise no induration or TTP to indicate residual infection  Disposition:  Discharge disposition: 01-Home or Self Care       Discharge Instructions    Discharge patient   Complete by: As directed    Discharge disposition: 01-Home or Self Care   Discharge patient date: 12/23/2019     Allergies as of 12/23/2019   No Known Allergies     Medication List    STOP taking these medications   oxyCODONE 5 MG immediate release tablet Commonly known as: Oxy IR/ROXICODONE     TAKE these medications   acyclovir 200 MG capsule Commonly known as: ZOVIRAX Take 2 capsules (400 mg total) by mouth 2 (two) times daily.   ALPRAZolam 0.25 MG tablet Commonly known as: XANAX Take 1 tablet (0.25 mg total) by mouth at bedtime as needed. for anxiety AS NEEDED ONLY!   Anoro Ellipta 62.5-25 MCG/INH Aepb Generic drug: umeclidinium-vilanterol Inhale 1 puff into the lungs daily.   aspirin EC 81 MG tablet Take 81 mg by mouth daily.   atorvastatin 10 MG tablet Commonly known as: LIPITOR Take 1 tablet (10 mg total) by mouth daily.   busPIRone 5 MG tablet Commonly known as: BUSPAR Take 1 tablet (5 mg total) by mouth daily.   cephALEXin 500 MG capsule Commonly known as: KEFLEX Take 500 mg by mouth 2 (two) times daily.   clopidogrel 75 MG tablet Commonly known as: PLAVIX Take 1 tablet (75 mg total) by mouth daily.   collagenase ointment Commonly known as: SANTYL Apply 1 application topically daily.    dexamethasone 6 MG tablet Commonly known as: DECADRON Take 3 mg by mouth 2 (two) times daily.   FLUoxetine 20 MG capsule Commonly known as: PROZAC Take 20 mg by mouth daily.   furosemide 20 MG tablet Commonly known as: LASIX Take 1 tablet (20 mg total) by mouth daily.   HYDROcodone-acetaminophen 5-325 MG tablet Commonly known as: NORCO/VICODIN Take 1-2 tablets by mouth every 6 (six) hours as needed for moderate pain.   levETIRAcetam 500 MG tablet Commonly known as: Keppra Take 1 tablet (500 mg total) by mouth 2 (two) times daily.   levothyroxine 112 MCG tablet Commonly known as: SYNTHROID Take 1 tablet (112 mcg total) by mouth daily.   lidocaine-prilocaine cream Commonly known as: EMLA Apply 1 application topically as needed.   lisinopril 5 MG tablet Commonly known as: ZESTRIL Take 1 tablet (5 mg total) by mouth daily.   MULTIVITAMIN ADULT PO Take 1 tablet by mouth daily.   mupirocin ointment 2 % Commonly known as: Bactroban Apply 1 application topically 2 (two) times daily.   nystatin cream Commonly known as: MYCOSTATIN Apply 1 application topically 2 (two) times daily.   Olopatadine HCl 0.2 % Soln Apply 1 drop to eye in  the morning and at bedtime.   omeprazole 20 MG capsule Commonly known as: PRILOSEC Take 1 capsule (20 mg total) by mouth daily.   osimertinib mesylate 80 MG tablet Commonly known as: Tagrisso Take 1 tablet (80 mg total) by mouth daily.   sodium hypochlorite 0.125 % Soln Commonly known as: DAKIN'S 1/4 STRENGTH Irrigate with as directed every morning. What changed: how much to take   Ventolin HFA 108 (90 Base) MCG/ACT inhaler Generic drug: albuterol Inhale 1-2 puffs into the lungs every 6 (six) hours as needed for wheezing or shortness of breath.            Durable Medical Equipment  (From admission, onward)         Start     Ordered   12/22/19 1539  For home use only DME lightweight manual wheelchair with seat cushion  Once        Comments: Patient suffers from deconditioning which impairs their ability to perform daily activities like bathing, dressing, feeding, grooming, and toileting in the home.  A cane, crutch, or walker will not resolve  issue with performing activities of daily living. A wheelchair will allow patient to safely perform daily activities. Patient is not able to propel themselves in the home using a standard weight wheelchair due to arm weakness, endurance, and general weakness. Patient can self propel in the lightweight wheelchair. Length of need 6 months . Accessories: elevating leg rests (ELRs), wheel locks, extensions and anti-tippers.   12/22/19 1539   12/21/19 0844  For home use only DME Negative pressure wound device  Once       Comments: Pt will need the WHITE sponge in addition to black sponge  Question Answer Comment  Frequency of dressing change 2 times per week   Length of need 6 Months   Dressing type Foam   Amount of suction 100 mm/Hg   Pressure application Continuous pressure   Supplies 10 canisters and 15 dressings per month for duration of therapy      12/21/19 0844   12/20/19 1623  For home use only DME Other see comment  Once       Comments: Harrel Lemon lift  Question:  Length of Need  Answer:  6 Months   12/20/19 1622   12/20/19 1620  For home use only DME Hospital bed  Once       Question Answer Comment  Length of Need 6 Months   Bed type Semi-electric      12/20/19 1622          Follow-up Information    Lysle Pearl, Goble Fudala, DO Follow up in 1 week(s).   Specialty: Surgery Why: wound check Contact information: [place] [address] [phone]                Total time spent arranging discharge was >67min. Signed: Benjamine Sprague 12/23/2019, 8:13 PM

## 2019-12-24 NOTE — Progress Notes (Signed)
Patient transported home via [place]. All of patients' belongings and home wound vac taken. IV access was removed. Tolerated discharge well.  Family notified of patient's departure.  Kristina Wright

## 2019-12-27 ENCOUNTER — Other Ambulatory Visit: Payer: Self-pay | Admitting: Family Medicine

## 2019-12-27 ENCOUNTER — Telehealth: Payer: Self-pay | Admitting: Primary Care

## 2019-12-27 DIAGNOSIS — F419 Anxiety disorder, unspecified: Secondary | ICD-10-CM

## 2019-12-27 NOTE — Telephone Encounter (Signed)
Spoke with patient regarding Palliative services and she was in agreement with scheduling a visit.  I have scheduled an In-person Consult for 12/28/19 @ 11:30 AM

## 2019-12-27 NOTE — Telephone Encounter (Signed)
Requested medication (s) are due for refill today: yes  Requested medication (s) are on the active medication list: yes  Last refill:  09/23/19 #30  Future visit scheduled: no  Notes to clinic:  Please review for refill. Refill not delegated per protocol    Requested Prescriptions  Pending Prescriptions Disp Refills   ALPRAZolam (XANAX) 0.25 MG tablet [Pharmacy Med Name: alprazolam 0.25 mg tablet] 30 tablet 0    Sig: TAKE ONE TABLET BY MOUTH AT BEDTIME AS NEEDED FOR ANXIETY      Not Delegated - Psychiatry:  Anxiolytics/Hypnotics Failed - 12/27/2019 11:00 AM      Failed - This refill cannot be delegated      Failed - Urine Drug Screen completed in last 360 days.      Passed - Valid encounter within last 6 months    Recent Outpatient Visits           3 weeks ago Hospital discharge follow-up   [facility] Juline Patch, MD   2 months ago Lymphedema of both lower extremities   [facility] Juline Patch, MD   3 months ago [facility] Juline Patch, MD   4 months ago Irregularly irregular pulse rhythm   [facility] Juline Patch, MD   4 months ago Essential hypertension   [facility] Juline Patch, MD

## 2019-12-28 ENCOUNTER — Other Ambulatory Visit: Payer: Self-pay

## 2019-12-28 ENCOUNTER — Other Ambulatory Visit: Payer: 59 | Admitting: Primary Care

## 2019-12-28 DIAGNOSIS — C7800 Secondary malignant neoplasm of unspecified lung: Secondary | ICD-10-CM

## 2019-12-28 DIAGNOSIS — Z515 Encounter for palliative care: Secondary | ICD-10-CM

## 2019-12-28 DIAGNOSIS — K651 Peritoneal abscess: Secondary | ICD-10-CM

## 2019-12-28 DIAGNOSIS — C801 Malignant (primary) neoplasm, unspecified: Secondary | ICD-10-CM

## 2019-12-28 NOTE — Progress Notes (Signed)
[facility] Consult Note Telephone: [phone]  Fax: [phone]  PATIENT NAME: Kristina Wright [place] [address] [phone] (home)  DOB: 02-17-1956 MRN: [id number]  PRIMARY CARE PROVIDER:    Juline Patch, MD,  [address] [place] [address] [phone]  REFERRING PROVIDER:   Juline Patch, MD [address] [place],  [place] [address] [phone]  RESPONSIBLE PARTY:   Extended Emergency Contact Information Primary Emergency Contact: [place] of [place] Phone: [phone] Relation: Sister Secondary Emergency Contact: Hemmer,Susan Mobile Phone: [phone] Relation: Daughter  I met face to face with patient and family in home.  ASSESSMENT AND RECOMMENDATIONS:   1. Advance Care Planning/Goals of Care: Goals include to maximize quality of life and symptom management. Our advance care planning conversation included a discussion about:     The value and importance of advance care planning   Experiences with loved ones who have been seriously ill or have died   Exploration of personal, cultural or spiritual beliefs that might influence medical decisions   Exploration of goals of care in the event of a sudden injury or illness   Identification and preparation of a healthcare agent - in process of her daughter getting POA notarized.  Review and updating or creation of an  advance directive document. Discussed DNR and Most. Was confused about DNR. In the end decided on full code. Has prepaid burial plan. Did MOST form with Full code, full treatment options. Will continue to discuss.   2. Symptom Management:   Wound Care: Has wound vac on belly, small amount of output. Neighbor has balked at 3 x/ week vac changes. T/c to Dr. Lysle Pearl who agrees that wet to dry is sufficient if she can get home nursing and PT. I have communicated with Alvis Lemmings who will pick up case  and start PT, Aide and nursing.  Caregiving: Tracie Dore is girlfriend who is live -in at the house,hopefully can learn new dressing changes.Ex husband is here twice daily for food. Can refer to [employer]. Recommended protein max at least 2 /day.   Pain: controlled for now.   Immobility: Has hospital bed but no rails. [phone] is provider, [employer].Talked to them, the bed remote works fine. She does need rails [employer] will reach out to send these.  Ostomy: Has found supplier and is (I) with it. She states order will arrive in a few days.  Cancer: Taking Tagrisso since 03/01/18,States it has cured her lung cancer. States she has brain lesions and has had gamma knife. Has port for access. Has not had transfusions. Currently sister states she is not able to get to her upcoming f/u due to transportatoin.  3. Follow up Palliative Care Visit: Palliative care will continue to follow for goals of care clarification and symptom management. Return 2 weeks or prn.  4. Family /Caregiver/Community Supports:  Has contentious relationships with family members. Needs home support but has been refused by most agencies. Have been able to fine one once the wound vac was d/ced. Neighbor had been willing to change but after one change, does not want to continue.  5. Cognitive / Functional decline: A and O x 2.  Declining in adl ability, needs much support. Has some family who stops in.   I spent 75 minutes providing this consultation,  from 1200 to 1315. More than 50% of the time in this consultation was spent coordinating communication.  CHIEF COMPLAINT: Wound care, immobility  HISTORY OF PRESENT ILLNESS:  Kristina Wright is a 64 y.o. year old female with multiple medical problems including lung cancer, wounds, debility, immobility. Palliative Care was asked to follow this patient by consultation request of Billey Chang NP to help address advance care planning and goals of care. This is the initial   visit.  CODE STATUS: FULL  PPS: 40%  HOSPICE ELIGIBILITY/DIAGNOSIS: TBD  PAST MEDICAL HISTORY:  Past Medical History:  Diagnosis Date   Cancer ([place]) 1989   cervical   Depression    Family history of breast cancer    Family history of colon cancer    Family history of ovarian cancer    Family history of stomach cancer    Family history of throat cancer    Hyperlipidemia    Hypertension    Malignant neoplasm of lung ([place]) 03/01/2018   Thyroid disease     SOCIAL HX:  Social History   Tobacco Use   Smoking status: Former Smoker    Packs/day: 1.00    Years: 25.00    Pack years: 25.00    Types: Cigarettes    Quit date: 11/19/2017    Years since quitting: 2.1   Smokeless tobacco: Never Used  Substance Use Topics   Alcohol use: No   FAMILY HX:  Family History  Problem Relation Age of Onset   Colon cancer Mother 44   Colon cancer Maternal Grandmother 93   Hypertension Father    Breast cancer Sister 11   Throat cancer Brother    Heart attack Maternal Aunt    Ovarian cancer Maternal Aunt 70   Stomach cancer Paternal Grandmother        dx >50   Throat cancer Brother     ALLERGIES: No Known Allergies   PERTINENT MEDICATIONS:  Outpatient Encounter Medications as of 12/28/2019  Medication Sig   acyclovir (ZOVIRAX) 200 MG capsule Take 2 capsules (400 mg total) by mouth 2 (two) times daily.   ALPRAZolam (XANAX) 0.25 MG tablet Take 1 tablet (0.25 mg total) by mouth at bedtime as needed. for anxiety AS NEEDED ONLY!   aspirin EC 81 MG tablet Take 81 mg by mouth daily.   atorvastatin (LIPITOR) 10 MG tablet Take 1 tablet (10 mg total) by mouth daily.   busPIRone (BUSPAR) 5 MG tablet Take 1 tablet (5 mg total) by mouth daily.   cephALEXin (KEFLEX) 500 MG capsule Take 500 mg by mouth 2 (two) times daily.   clopidogrel (PLAVIX) 75 MG tablet Take 1 tablet (75 mg total) by mouth daily.   collagenase (SANTYL) ointment Apply 1 application topically  daily.   dexamethasone (DECADRON) 6 MG tablet Take 3 mg by mouth 2 (two) times daily.   FLUoxetine (PROZAC) 20 MG capsule Take 20 mg by mouth daily.   furosemide (LASIX) 20 MG tablet Take 1 tablet (20 mg total) by mouth daily.   HYDROcodone-acetaminophen (NORCO/VICODIN) 5-325 MG tablet Take 1-2 tablets by mouth every 6 (six) hours as needed for moderate pain.    levETIRAcetam (KEPPRA) 500 MG tablet Take 1 tablet (500 mg total) by mouth 2 (two) times daily.   levothyroxine (SYNTHROID) 112 MCG tablet Take 1 tablet (112 mcg total) by mouth daily.   lidocaine-prilocaine (EMLA) cream Apply 1 application topically as needed.   lisinopril (ZESTRIL) 5 MG tablet Take 1 tablet (5 mg total) by mouth daily.   Multiple Vitamins-Minerals (MULTIVITAMIN ADULT PO) Take 1 tablet by mouth daily.  mupirocin ointment (BACTROBAN) 2 % Apply 1 application topically 2 (two) times daily.   nystatin cream (MYCOSTATIN) Apply 1 application topically 2 (two) times daily.   Olopatadine HCl 0.2 % SOLN Apply 1 drop to eye in the morning and at bedtime.   omeprazole (PRILOSEC) 20 MG capsule Take 1 capsule (20 mg total) by mouth daily.   osimertinib mesylate (TAGRISSO) 80 MG tablet Take 1 tablet (80 mg total) by mouth daily.   sodium hypochlorite (DAKIN'S 1/4 STRENGTH) 0.125 % SOLN Irrigate with as directed every morning. (Patient taking differently: Irrigate with 1 application as directed every morning. )   umeclidinium-vilanterol (ANORO ELLIPTA) 62.5-25 MCG/INH AEPB Inhale 1 puff into the lungs daily.   VENTOLIN HFA 108 (90 Base) MCG/ACT inhaler Inhale 1-2 puffs into the lungs every 6 (six) hours as needed for wheezing or shortness of breath.   [DISCONTINUED] prochlorperazine (COMPAZINE) 10 MG tablet Take 1 tablet (10 mg total) by mouth every 6 (six) hours as needed (Nausea or vomiting). (Patient not taking: Reported on 03/03/2019)   No facility-administered encounter medications on file as of 12/28/2019.     PHYSICAL EXAM / ROS:   Current and past weights: 204 lbs General: NAD, frail appearing, obese Cardiovascular: no chest pain reported, no LE edema  Pulmonary: no cough, no increased SOB, room air Abdomen: appetite fair, needs protein, albumin 2.3,, endorses occ  constipation, continent of bowel GU: denies dysuria, continent of urine MSK:  ++ joint and ROM abnormalities, non ambulatory Skin: multiple wounds from surgery (abdomen) drains (flanks) and skin breakdown on feet. Neurological: Weakness, denise pain, a and o x 3.a  Jason Coop, NP , DNP, MPH, [facility]  COVID-19 PATIENT SCREENING TOOL  Person answering questions: ____________self______ _____   1.  Is the patient or any family member in the home showing any signs or symptoms regarding respiratory infection?               Person with Symptom- __________NA_________________  a. Fever                                                                          Yes___ No___          ___________________  b. Shortness of breath                                                    Yes___ No___          ___________________ c. Cough/congestion                                       Yes___  No___         ___________________ d. Body aches/pains  Yes___ No___        ____________________ e. Gastrointestinal symptoms (diarrhea, nausea)           Yes___ No___        ____________________  2. Within the past 14 days, has anyone living in the home had any contact with someone with or under investigation for COVID-19?    Yes___ No_X_   Person __________________

## 2019-12-29 ENCOUNTER — Telehealth: Payer: Self-pay | Admitting: Family Medicine

## 2019-12-29 ENCOUNTER — Telehealth: Payer: Self-pay

## 2019-12-29 NOTE — Telephone Encounter (Signed)
Copied from [place] [phone]. Topic: General - Inquiry >> Dec 29, 2019  1:21 PM Gillis Ends D wrote: Reason for CRM: Patient wants to speak to someone in reference to her current medications. She was in the hospital and she wants to make sure they all are up to date. Please advise the patient.

## 2019-12-29 NOTE — Telephone Encounter (Signed)
At the direction of Palliative NP, Joellen Jersey, message sent to PCP to request orders for home health PT, SN and HHA.

## 2019-12-30 ENCOUNTER — Telehealth: Payer: Self-pay | Admitting: Family Medicine

## 2019-12-30 NOTE — Telephone Encounter (Signed)
Copied from [place] [phone]. Topic: General - Other >> Dec 30, 2019  8:14 AM Alanda Slim E wrote: Reason for CRM: Kendrick Fries called to speak with Baxter Flattery about the Pt/ she wanted to let her know that the pt had a massive stroke last night and is in [facility]/ please advise and call Ms. White

## 2019-12-30 NOTE — Telephone Encounter (Signed)
Pt in hospital after massive stroke

## 2019-12-30 NOTE — Telephone Encounter (Signed)
Copied from [employer] [phone]. Topic: General - Other >> Dec 29, 2019  4:44 PM Rainey Pines A wrote: Ralene Bathe  NP with Pall[facility] departmentat  [facility] nurses call and stated that she will be out of office next few days but you can reach the nurse Arthur Holms at [phone]

## 2019-12-30 NOTE — Telephone Encounter (Signed)
Pt is hosp

## 2019-12-30 NOTE — Telephone Encounter (Signed)
Tried calling office again and faxed over paper stating for them to call us

## 2020-01-02 ENCOUNTER — Telehealth: Payer: Self-pay | Admitting: Oncology

## 2020-01-02 NOTE — Telephone Encounter (Signed)
Received phone call from [place] with [facility] stating that they are working on getting patient to the [facility] and wanted to cancel her appts. Lab/Yu appts cancelled as requested and team informed.

## 2020-01-03 ENCOUNTER — Ambulatory Visit: Payer: 59

## 2020-01-05 ENCOUNTER — Other Ambulatory Visit: Payer: 59 | Admitting: Primary Care

## 2020-01-06 ENCOUNTER — Ambulatory Visit: Payer: 59 | Admitting: Oncology

## 2020-01-06 ENCOUNTER — Other Ambulatory Visit: Payer: 59

## 2020-01-09 ENCOUNTER — Ambulatory Visit (HOSPITAL_COMMUNITY): Payer: 59

## 2020-01-12 ENCOUNTER — Ambulatory Visit: Payer: 59

## 2020-01-12 ENCOUNTER — Ambulatory Visit: Payer: BLUE CROSS/BLUE SHIELD | Admitting: Radiation Oncology

## 2020-01-20 DEATH — deceased

## 2020-01-23 ENCOUNTER — Ambulatory Visit: Payer: 59 | Admitting: Radiation Oncology

## 2020-02-29 ENCOUNTER — Ambulatory Visit: Payer: 59 | Admitting: Radiation Oncology

## 2022-03-14 IMAGING — CT CT CHEST W/O CM
1 series · 15 of 34 positions shown, 19 images · non-contrast
Comparison: 04/20/2019

CLINICAL DATA: Status post SBRT or left lower lobe lung cancer.

EXAM:
CT CHEST WITHOUT CONTRAST
TECHNIQUE: Multidetector CT imaging of the chest was performed following the
standard protocol without IV contrast.

[Series 2: thorax · axial · 0.77mm/px · z∈[-738,-460]mm · 15 of 165 slices shown, 19 images]
[im 13/165  mediastinal]
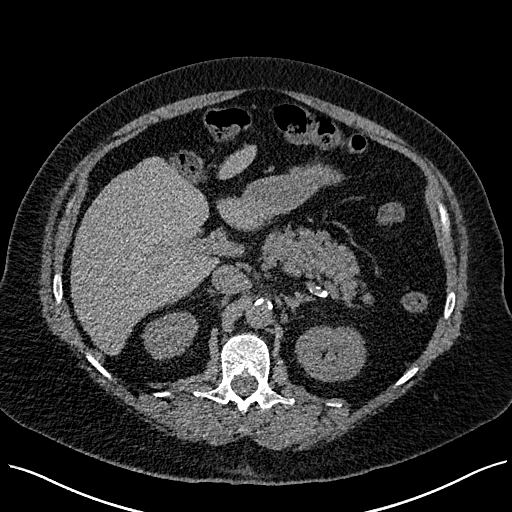
[im 13/165  lung]
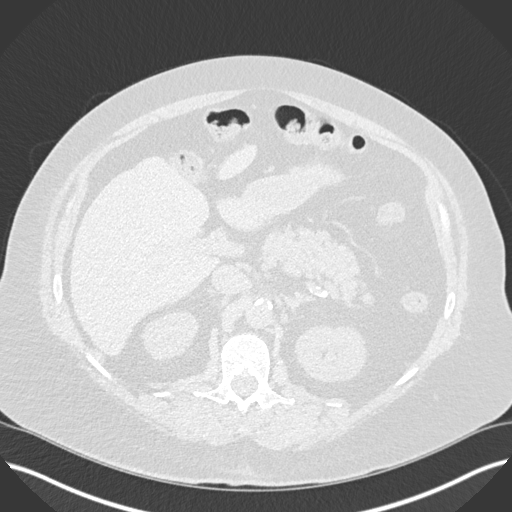
[im 25/165  lung]
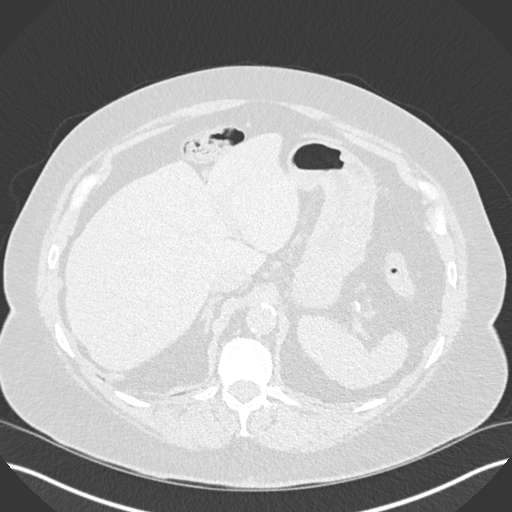
[im 33/165  lung]
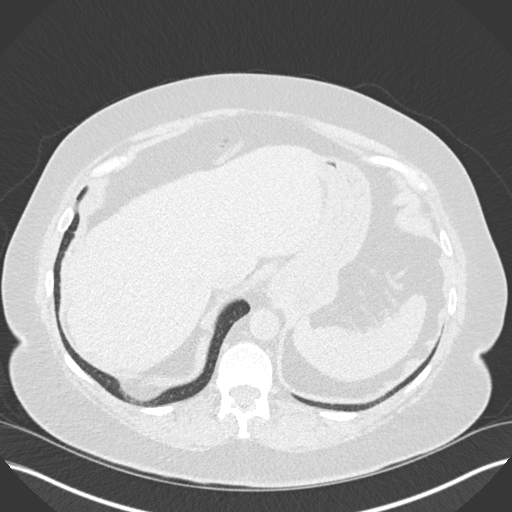
[im 43/165  lung]
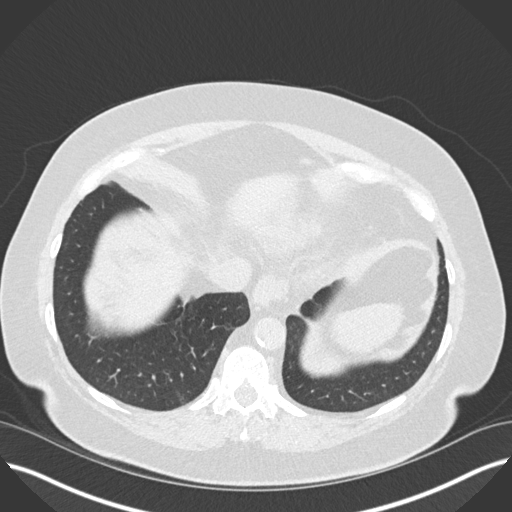
[im 55/165  mediastinal]
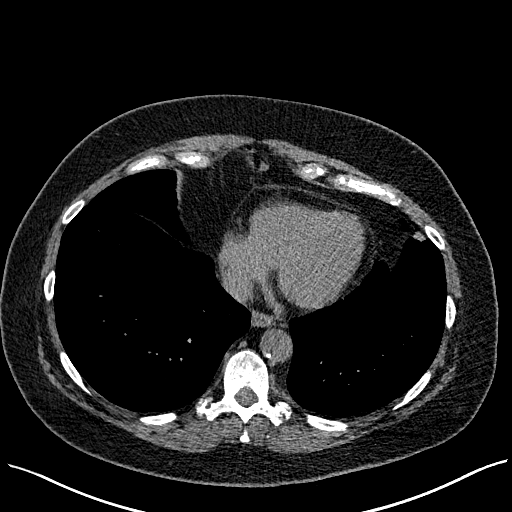
[im 55/165  lung]
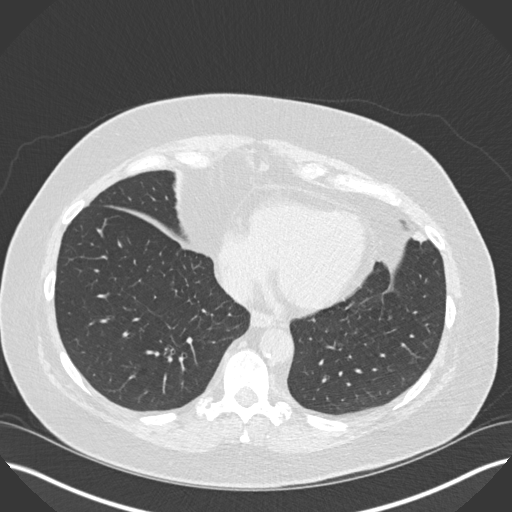
[im 66/165  lung]
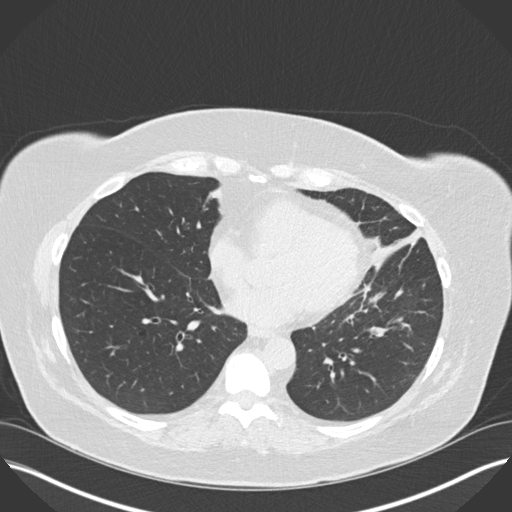
[im 73/165  lung]
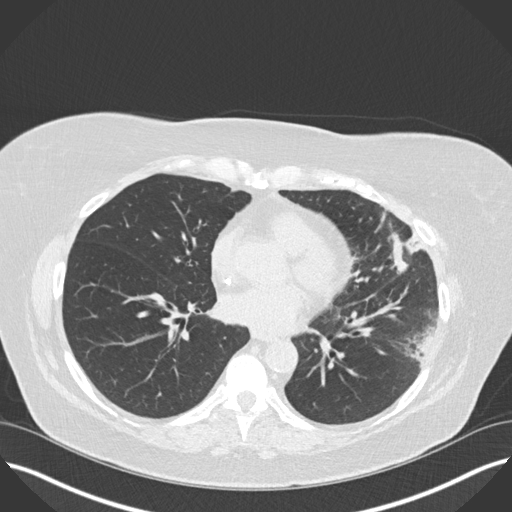
[im 86/165  lung]
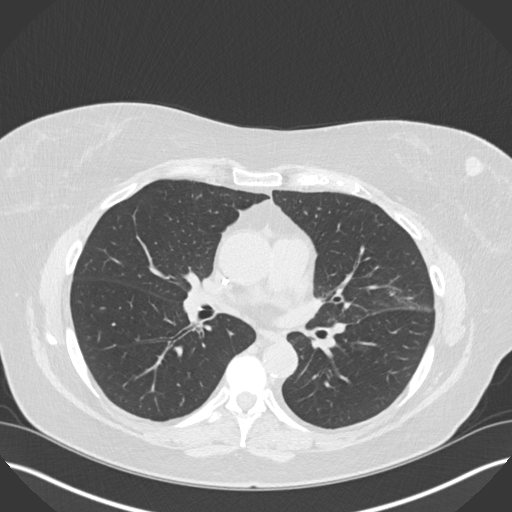
[im 92/165  mediastinal]
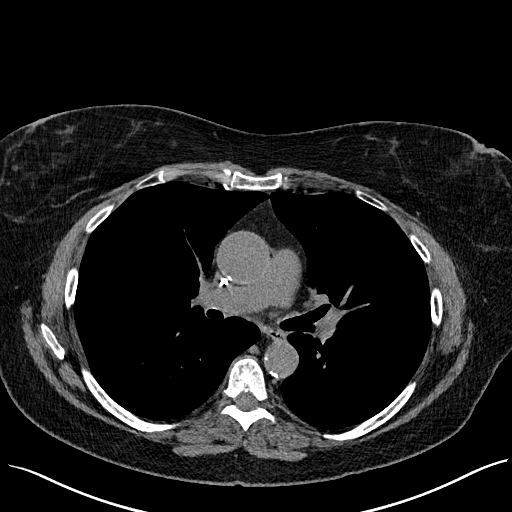
[im 92/165  lung]
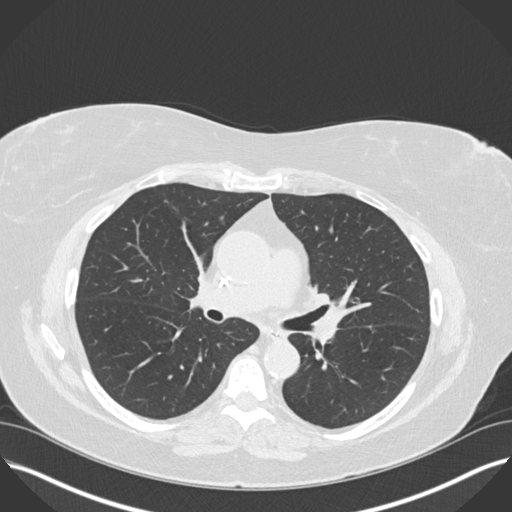
[im 99/165  lung]
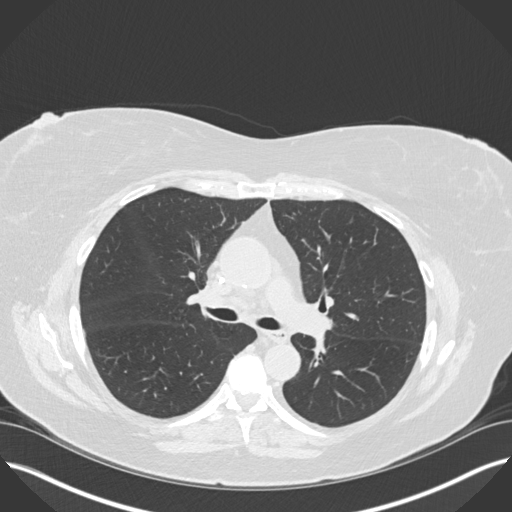
[im 110/165  lung]
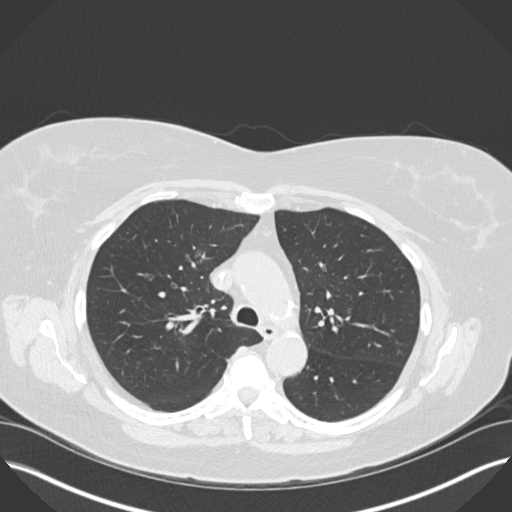
[im 122/165  lung]
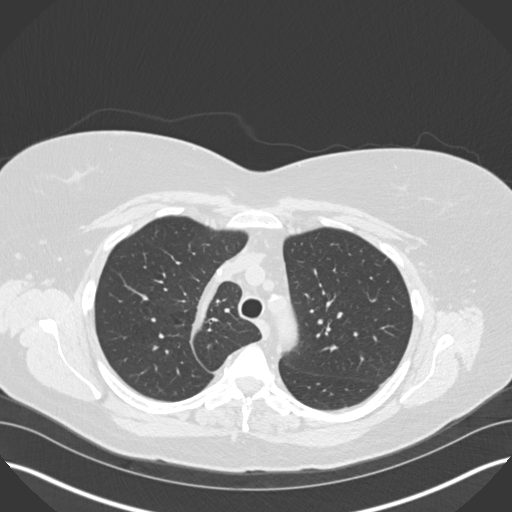
[im 132/165  mediastinal]
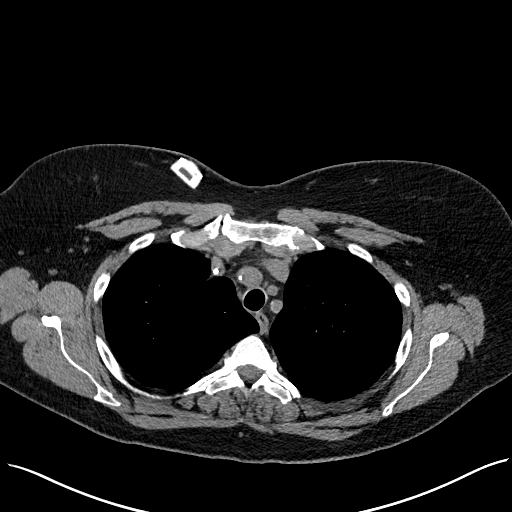
[im 132/165  lung]
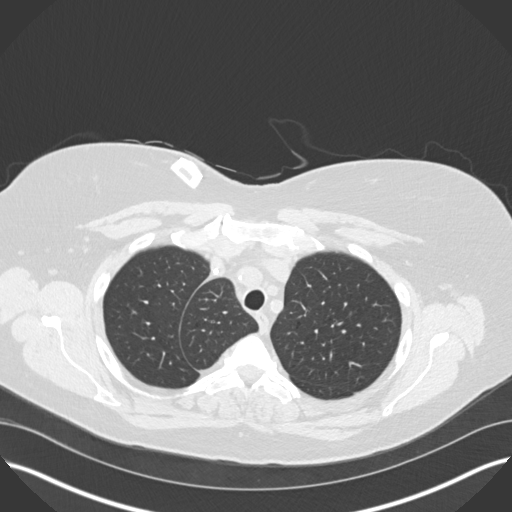
[im 140/165  lung]
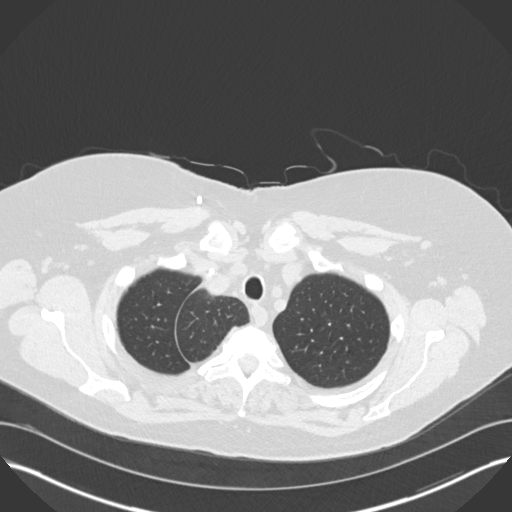
[im 152/165  lung]
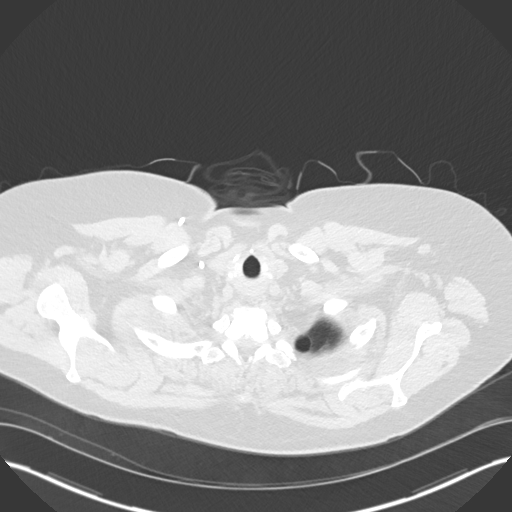

[15 of 34 positions shown; findings below may reference images not displayed]

FINDINGS: Cardiovascular: The heart size appears within normal limits. Aortic
atherosclerosis. No pericardial effusion. Left circumflex coronary
artery calcification.

Mediastinum/Nodes: Normal appearance of the thyroid gland. The
trachea appears patent and is midline. Small hiatal hernia. No
enlarged mediastinal or hilar adenopathy.

Lungs/Pleura: Paraseptal and centrilobular emphysema. There is a
bandlike area fibrosis and architectural distortion involving the
lingula and superior segment of left lower lobe, image 93/3.
Findings reflect changes secondary to external beam radiation.
Underlying lung lesion within the left lower lobe is no longer
measurable separate from these changes. No new lung lesion.

Upper Abdomen: No acute abnormality.

Musculoskeletal: No chest wall mass or suspicious bone lesions
identified.
IMPRESSION: 1. Continued evolutionary changes secondary to external beam
radiation postradiation involving the lingula and superior segment
of left lower lobe. No specific findings identified to suggest
residual or recurrence of tumor.
2. Emphysema and aortic atherosclerosis.
3. Left circumflex coronary artery calcification.

Aortic Atherosclerosis ([id number]) and Emphysema ([id number]).
